# Patient Record
Sex: Male | Born: 1935 | Race: White | Hispanic: No | Marital: Married | Smoking: Current every day smoker
Health system: Southern US, Community
[De-identification: ages and names within clinical notes are randomized; demographics above are authoritative.]

## PROBLEM LIST (undated history)

## (undated) DIAGNOSIS — I5020 Unspecified systolic (congestive) heart failure: Secondary | ICD-10-CM

## (undated) DIAGNOSIS — N189 Chronic kidney disease, unspecified: Secondary | ICD-10-CM

## (undated) DIAGNOSIS — C9000 Multiple myeloma not having achieved remission: Secondary | ICD-10-CM

## (undated) DIAGNOSIS — IMO0001 Reserved for inherently not codable concepts without codable children: Secondary | ICD-10-CM

## (undated) DIAGNOSIS — K219 Gastro-esophageal reflux disease without esophagitis: Secondary | ICD-10-CM

## (undated) DIAGNOSIS — I219 Acute myocardial infarction, unspecified: Secondary | ICD-10-CM

## (undated) DIAGNOSIS — G629 Polyneuropathy, unspecified: Secondary | ICD-10-CM

## (undated) DIAGNOSIS — I639 Cerebral infarction, unspecified: Secondary | ICD-10-CM

## (undated) DIAGNOSIS — I10 Essential (primary) hypertension: Secondary | ICD-10-CM

## (undated) DIAGNOSIS — C801 Malignant (primary) neoplasm, unspecified: Secondary | ICD-10-CM

## (undated) DIAGNOSIS — I2510 Atherosclerotic heart disease of native coronary artery without angina pectoris: Secondary | ICD-10-CM

## (undated) DIAGNOSIS — J449 Chronic obstructive pulmonary disease, unspecified: Secondary | ICD-10-CM

## (undated) HISTORY — PX: TOTAL HIP ARTHROPLASTY: SHX124

## (undated) HISTORY — DX: Malignant (primary) neoplasm, unspecified: C80.1

## (undated) HISTORY — DX: Multiple myeloma not having achieved remission: C90.00

## (undated) HISTORY — DX: Chronic kidney disease, unspecified: N18.9

## (undated) HISTORY — DX: Acute myocardial infarction, unspecified: I21.9

## (undated) HISTORY — PX: APPENDECTOMY: SHX54

## (undated) HISTORY — PX: CHOLECYSTECTOMY: SHX55

## (undated) HISTORY — PX: AV FISTULA PLACEMENT: SHX1204

---

## 2003-11-12 DIAGNOSIS — I219 Acute myocardial infarction, unspecified: Secondary | ICD-10-CM

## 2003-11-12 HISTORY — DX: Acute myocardial infarction, unspecified: I21.9

## 2003-12-30 ENCOUNTER — Other Ambulatory Visit: Payer: Self-pay

## 2003-12-31 ENCOUNTER — Other Ambulatory Visit: Payer: Self-pay

## 2004-01-03 ENCOUNTER — Other Ambulatory Visit: Payer: Self-pay

## 2004-09-03 ENCOUNTER — Ambulatory Visit: Payer: Self-pay | Admitting: Unknown Physician Specialty

## 2004-09-13 ENCOUNTER — Ambulatory Visit: Payer: Self-pay | Admitting: Internal Medicine

## 2004-09-25 ENCOUNTER — Ambulatory Visit: Payer: Self-pay | Admitting: Unknown Physician Specialty

## 2004-10-11 ENCOUNTER — Ambulatory Visit: Payer: Self-pay | Admitting: Internal Medicine

## 2004-11-11 ENCOUNTER — Ambulatory Visit: Payer: Self-pay | Admitting: Internal Medicine

## 2004-12-12 ENCOUNTER — Ambulatory Visit: Payer: Self-pay | Admitting: Internal Medicine

## 2005-01-09 ENCOUNTER — Ambulatory Visit: Payer: Self-pay | Admitting: Internal Medicine

## 2005-02-09 ENCOUNTER — Ambulatory Visit: Payer: Self-pay | Admitting: Internal Medicine

## 2005-03-11 ENCOUNTER — Ambulatory Visit: Payer: Self-pay | Admitting: Internal Medicine

## 2005-04-25 ENCOUNTER — Ambulatory Visit: Payer: Self-pay | Admitting: Internal Medicine

## 2005-05-11 ENCOUNTER — Ambulatory Visit: Payer: Self-pay | Admitting: Internal Medicine

## 2005-06-11 ENCOUNTER — Ambulatory Visit: Payer: Self-pay | Admitting: Internal Medicine

## 2005-07-12 ENCOUNTER — Ambulatory Visit: Payer: Self-pay | Admitting: Internal Medicine

## 2005-08-11 ENCOUNTER — Ambulatory Visit: Payer: Self-pay | Admitting: Internal Medicine

## 2005-09-17 ENCOUNTER — Ambulatory Visit: Payer: Self-pay | Admitting: Internal Medicine

## 2005-10-11 ENCOUNTER — Ambulatory Visit: Payer: Self-pay | Admitting: Internal Medicine

## 2005-11-11 ENCOUNTER — Ambulatory Visit: Payer: Self-pay | Admitting: Internal Medicine

## 2005-12-12 ENCOUNTER — Ambulatory Visit: Payer: Self-pay | Admitting: Internal Medicine

## 2006-01-09 ENCOUNTER — Ambulatory Visit: Payer: Self-pay | Admitting: Internal Medicine

## 2006-02-09 ENCOUNTER — Ambulatory Visit: Payer: Self-pay | Admitting: Internal Medicine

## 2006-04-01 ENCOUNTER — Ambulatory Visit: Payer: Self-pay | Admitting: Internal Medicine

## 2006-04-11 ENCOUNTER — Ambulatory Visit: Payer: Self-pay | Admitting: Internal Medicine

## 2006-05-27 ENCOUNTER — Ambulatory Visit: Payer: Self-pay | Admitting: Internal Medicine

## 2006-06-11 ENCOUNTER — Ambulatory Visit: Payer: Self-pay | Admitting: Internal Medicine

## 2006-07-22 ENCOUNTER — Ambulatory Visit: Payer: Self-pay | Admitting: Internal Medicine

## 2006-08-11 ENCOUNTER — Ambulatory Visit: Payer: Self-pay | Admitting: Internal Medicine

## 2006-09-16 ENCOUNTER — Ambulatory Visit: Payer: Self-pay | Admitting: Internal Medicine

## 2006-10-11 ENCOUNTER — Ambulatory Visit: Payer: Self-pay | Admitting: Internal Medicine

## 2006-11-11 ENCOUNTER — Ambulatory Visit: Payer: Self-pay | Admitting: Internal Medicine

## 2006-12-12 ENCOUNTER — Ambulatory Visit: Payer: Self-pay | Admitting: Internal Medicine

## 2007-01-10 ENCOUNTER — Ambulatory Visit: Payer: Self-pay | Admitting: Internal Medicine

## 2007-02-16 ENCOUNTER — Ambulatory Visit: Payer: Self-pay | Admitting: Oncology

## 2007-02-23 ENCOUNTER — Other Ambulatory Visit: Payer: Self-pay

## 2007-02-23 ENCOUNTER — Emergency Department: Payer: Self-pay | Admitting: Unknown Physician Specialty

## 2007-03-03 ENCOUNTER — Ambulatory Visit: Payer: Self-pay | Admitting: Internal Medicine

## 2007-03-12 ENCOUNTER — Ambulatory Visit: Payer: Self-pay | Admitting: Oncology

## 2007-03-31 ENCOUNTER — Ambulatory Visit: Payer: Self-pay | Admitting: Internal Medicine

## 2007-04-12 ENCOUNTER — Ambulatory Visit: Payer: Self-pay | Admitting: Oncology

## 2007-04-12 ENCOUNTER — Ambulatory Visit: Payer: Self-pay | Admitting: Internal Medicine

## 2007-05-11 ENCOUNTER — Other Ambulatory Visit: Payer: Self-pay

## 2007-05-11 ENCOUNTER — Emergency Department: Payer: Self-pay | Admitting: Emergency Medicine

## 2007-06-12 ENCOUNTER — Ambulatory Visit: Payer: Self-pay | Admitting: Cardiology

## 2007-06-23 ENCOUNTER — Ambulatory Visit: Payer: Self-pay | Admitting: Internal Medicine

## 2007-06-24 ENCOUNTER — Ambulatory Visit: Payer: Self-pay | Admitting: Internal Medicine

## 2007-07-13 ENCOUNTER — Ambulatory Visit: Payer: Self-pay | Admitting: Internal Medicine

## 2007-08-12 ENCOUNTER — Ambulatory Visit: Payer: Self-pay | Admitting: Internal Medicine

## 2007-09-14 ENCOUNTER — Ambulatory Visit: Payer: Self-pay | Admitting: Internal Medicine

## 2007-09-16 ENCOUNTER — Ambulatory Visit: Payer: Self-pay | Admitting: Internal Medicine

## 2007-10-12 ENCOUNTER — Ambulatory Visit: Payer: Self-pay | Admitting: Internal Medicine

## 2007-11-12 ENCOUNTER — Ambulatory Visit: Payer: Self-pay | Admitting: Internal Medicine

## 2007-12-13 ENCOUNTER — Ambulatory Visit: Payer: Self-pay | Admitting: Internal Medicine

## 2008-01-10 ENCOUNTER — Ambulatory Visit: Payer: Self-pay | Admitting: Internal Medicine

## 2008-02-10 ENCOUNTER — Ambulatory Visit: Payer: Self-pay | Admitting: Internal Medicine

## 2008-03-11 ENCOUNTER — Ambulatory Visit: Payer: Self-pay | Admitting: Internal Medicine

## 2008-04-11 ENCOUNTER — Ambulatory Visit: Payer: Self-pay | Admitting: Internal Medicine

## 2008-05-11 ENCOUNTER — Ambulatory Visit: Payer: Self-pay | Admitting: Internal Medicine

## 2008-06-11 ENCOUNTER — Ambulatory Visit: Payer: Self-pay | Admitting: Internal Medicine

## 2008-07-12 ENCOUNTER — Ambulatory Visit: Payer: Self-pay | Admitting: Internal Medicine

## 2008-08-11 ENCOUNTER — Ambulatory Visit: Payer: Self-pay | Admitting: Internal Medicine

## 2008-09-11 ENCOUNTER — Ambulatory Visit: Payer: Self-pay | Admitting: Internal Medicine

## 2008-10-11 ENCOUNTER — Ambulatory Visit: Payer: Self-pay | Admitting: Internal Medicine

## 2008-11-11 ENCOUNTER — Ambulatory Visit: Payer: Self-pay | Admitting: Internal Medicine

## 2008-12-12 ENCOUNTER — Ambulatory Visit: Payer: Self-pay | Admitting: Internal Medicine

## 2008-12-28 ENCOUNTER — Ambulatory Visit: Payer: Self-pay | Admitting: Internal Medicine

## 2009-01-07 ENCOUNTER — Observation Stay: Payer: Self-pay | Admitting: Internal Medicine

## 2009-01-09 ENCOUNTER — Ambulatory Visit: Payer: Self-pay | Admitting: Internal Medicine

## 2009-02-09 ENCOUNTER — Ambulatory Visit: Payer: Self-pay | Admitting: Internal Medicine

## 2009-02-24 ENCOUNTER — Ambulatory Visit: Payer: Self-pay | Admitting: Internal Medicine

## 2009-03-11 ENCOUNTER — Ambulatory Visit: Payer: Self-pay | Admitting: Internal Medicine

## 2009-04-11 ENCOUNTER — Ambulatory Visit: Payer: Self-pay | Admitting: Internal Medicine

## 2009-05-11 ENCOUNTER — Ambulatory Visit: Payer: Self-pay | Admitting: Internal Medicine

## 2009-06-28 ENCOUNTER — Ambulatory Visit: Payer: Self-pay | Admitting: Internal Medicine

## 2009-07-12 ENCOUNTER — Ambulatory Visit: Payer: Self-pay | Admitting: Internal Medicine

## 2009-08-11 ENCOUNTER — Ambulatory Visit: Payer: Self-pay | Admitting: Internal Medicine

## 2009-08-23 ENCOUNTER — Ambulatory Visit: Payer: Self-pay | Admitting: Internal Medicine

## 2009-09-11 ENCOUNTER — Ambulatory Visit: Payer: Self-pay | Admitting: Internal Medicine

## 2009-10-11 ENCOUNTER — Ambulatory Visit: Payer: Self-pay | Admitting: Internal Medicine

## 2009-10-14 ENCOUNTER — Ambulatory Visit: Payer: Self-pay | Admitting: Family Medicine

## 2009-11-11 ENCOUNTER — Ambulatory Visit: Payer: Self-pay | Admitting: Internal Medicine

## 2009-11-15 ENCOUNTER — Ambulatory Visit: Payer: Self-pay | Admitting: Internal Medicine

## 2009-11-24 ENCOUNTER — Ambulatory Visit: Payer: Self-pay | Admitting: Unknown Physician Specialty

## 2009-12-12 ENCOUNTER — Ambulatory Visit: Payer: Self-pay | Admitting: Internal Medicine

## 2010-01-09 ENCOUNTER — Ambulatory Visit: Payer: Self-pay | Admitting: Internal Medicine

## 2010-02-09 ENCOUNTER — Ambulatory Visit: Payer: Self-pay | Admitting: Internal Medicine

## 2010-02-14 ENCOUNTER — Ambulatory Visit: Payer: Self-pay | Admitting: Internal Medicine

## 2010-03-11 ENCOUNTER — Ambulatory Visit: Payer: Self-pay | Admitting: Internal Medicine

## 2010-04-11 ENCOUNTER — Ambulatory Visit: Payer: Self-pay | Admitting: Internal Medicine

## 2010-05-09 ENCOUNTER — Ambulatory Visit: Payer: Self-pay | Admitting: Internal Medicine

## 2010-05-11 ENCOUNTER — Ambulatory Visit: Payer: Self-pay | Admitting: Internal Medicine

## 2010-06-11 ENCOUNTER — Ambulatory Visit: Payer: Self-pay | Admitting: Internal Medicine

## 2010-06-20 ENCOUNTER — Ambulatory Visit: Payer: Self-pay | Admitting: Internal Medicine

## 2010-07-12 ENCOUNTER — Ambulatory Visit: Payer: Self-pay | Admitting: Internal Medicine

## 2010-08-11 ENCOUNTER — Ambulatory Visit: Payer: Self-pay | Admitting: Internal Medicine

## 2010-09-11 ENCOUNTER — Ambulatory Visit: Payer: Self-pay | Admitting: Internal Medicine

## 2010-10-11 ENCOUNTER — Ambulatory Visit: Payer: Self-pay | Admitting: Internal Medicine

## 2010-11-11 ENCOUNTER — Ambulatory Visit: Payer: Self-pay | Admitting: Internal Medicine

## 2010-12-12 ENCOUNTER — Ambulatory Visit: Payer: Self-pay | Admitting: Internal Medicine

## 2011-01-22 ENCOUNTER — Ambulatory Visit: Payer: Self-pay | Admitting: Internal Medicine

## 2011-02-10 ENCOUNTER — Ambulatory Visit: Payer: Self-pay | Admitting: Internal Medicine

## 2011-02-17 ENCOUNTER — Inpatient Hospital Stay: Payer: Self-pay | Admitting: Internal Medicine

## 2011-02-17 ENCOUNTER — Ambulatory Visit: Payer: Self-pay | Admitting: Family Medicine

## 2011-03-12 ENCOUNTER — Ambulatory Visit: Payer: Self-pay | Admitting: Internal Medicine

## 2011-04-12 ENCOUNTER — Ambulatory Visit: Payer: Self-pay | Admitting: Internal Medicine

## 2011-05-12 ENCOUNTER — Ambulatory Visit: Payer: Self-pay | Admitting: Internal Medicine

## 2011-06-12 ENCOUNTER — Ambulatory Visit: Payer: Self-pay | Admitting: Internal Medicine

## 2011-07-13 ENCOUNTER — Ambulatory Visit: Payer: Self-pay | Admitting: Internal Medicine

## 2011-08-12 ENCOUNTER — Ambulatory Visit: Payer: Self-pay | Admitting: Internal Medicine

## 2011-09-12 ENCOUNTER — Ambulatory Visit: Payer: Self-pay | Admitting: Internal Medicine

## 2011-10-12 ENCOUNTER — Ambulatory Visit: Payer: Self-pay | Admitting: Internal Medicine

## 2011-11-12 ENCOUNTER — Ambulatory Visit: Payer: Self-pay | Admitting: Internal Medicine

## 2011-11-26 LAB — CBC CANCER CENTER
Basophil %: 0.3 %
Eosinophil %: 6.3 %
HGB: 11.7 g/dL — ABNORMAL LOW (ref 13.0–18.0)
Lymphocyte %: 20.3 %
MCHC: 32.7 g/dL (ref 32.0–36.0)
Monocyte %: 5 %
Neutrophil %: 68.1 %
Platelet: 156 x10 3/mm (ref 150–440)
RBC: 3.62 10*6/uL — ABNORMAL LOW (ref 4.40–5.90)
WBC: 5.6 x10 3/mm (ref 3.8–10.6)

## 2011-11-26 LAB — CREATININE, SERUM: Creatinine: 2.98 mg/dL — ABNORMAL HIGH (ref 0.60–1.30)

## 2011-11-27 LAB — PROT IMMUNOELECTROPHORES(ARMC)

## 2011-12-13 ENCOUNTER — Ambulatory Visit: Payer: Self-pay | Admitting: Internal Medicine

## 2012-01-09 LAB — CBC CANCER CENTER
Basophil %: 0.4 %
Eosinophil %: 6.1 %
HCT: 37.7 % — ABNORMAL LOW (ref 40.0–52.0)
MCH: 32.3 pg (ref 26.0–34.0)
MCV: 98.3 fL (ref 80–100)
Monocyte #: 0.4 x10 3/mm (ref 0.0–0.7)
Neutrophil #: 4.9 x10 3/mm (ref 1.4–6.5)
Neutrophil %: 63.9 %
WBC: 7.7 x10 3/mm (ref 3.8–10.6)

## 2012-01-09 LAB — CALCIUM: Calcium, Total: 9.2 mg/dL (ref 8.5–10.1)

## 2012-01-09 LAB — URIC ACID: Uric Acid: 7 mg/dL (ref 3.5–7.2)

## 2012-01-09 LAB — CREATININE, SERUM
Creatinine: 3.67 mg/dL — ABNORMAL HIGH (ref 0.60–1.30)
EGFR (Non-African Amer.): 17 — ABNORMAL LOW

## 2012-01-10 ENCOUNTER — Ambulatory Visit: Payer: Self-pay | Admitting: Internal Medicine

## 2012-01-10 LAB — KAPPA/LAMBDA FREE LIGHT CHAINS (ARMC)

## 2012-02-25 ENCOUNTER — Ambulatory Visit: Payer: Self-pay | Admitting: Internal Medicine

## 2012-02-25 LAB — CBC CANCER CENTER
Basophil %: 0.3 %
Eosinophil %: 3.1 %
HCT: 34.9 % — ABNORMAL LOW (ref 40.0–52.0)
HGB: 11.4 g/dL — ABNORMAL LOW (ref 13.0–18.0)
Lymphocyte #: 1.3 x10 3/mm (ref 1.0–3.6)
MCH: 32.2 pg (ref 26.0–34.0)
MCHC: 32.7 g/dL (ref 32.0–36.0)
MCV: 99 fL (ref 80–100)
Monocyte #: 0.5 x10 3/mm (ref 0.2–1.0)
Monocyte %: 5.7 %
Neutrophil #: 6.9 x10 3/mm — ABNORMAL HIGH (ref 1.4–6.5)
Neutrophil %: 76.8 %
RDW: 14.4 % (ref 11.5–14.5)

## 2012-02-25 LAB — URIC ACID: Uric Acid: 6.3 mg/dL (ref 3.5–7.2)

## 2012-02-25 LAB — CREATININE, SERUM
Creatinine: 3.06 mg/dL — ABNORMAL HIGH (ref 0.60–1.30)
EGFR (African American): 22 — ABNORMAL LOW

## 2012-02-25 LAB — CALCIUM: Calcium, Total: 8.9 mg/dL (ref 8.5–10.1)

## 2012-02-26 LAB — KAPPA/LAMBDA FREE LIGHT CHAINS (ARMC)

## 2012-02-26 LAB — PROT IMMUNOELECTROPHORES(ARMC)

## 2012-03-11 ENCOUNTER — Ambulatory Visit: Payer: Self-pay | Admitting: Internal Medicine

## 2012-03-11 LAB — CBC CANCER CENTER
Basophil #: 0 x10 3/mm (ref 0.0–0.1)
Basophil %: 0.7 %
Eosinophil #: 0.3 x10 3/mm (ref 0.0–0.7)
Eosinophil %: 4.5 %
HCT: 37.4 % — ABNORMAL LOW (ref 40.0–52.0)
HGB: 12.2 g/dL — ABNORMAL LOW (ref 13.0–18.0)
Lymphocyte #: 1.6 x10 3/mm (ref 1.0–3.6)
Lymphocyte %: 23.1 %
MCH: 32.3 pg (ref 26.0–34.0)
MCHC: 32.7 g/dL (ref 32.0–36.0)
Monocyte #: 0.3 x10 3/mm (ref 0.2–1.0)
Monocyte %: 4.8 %
Neutrophil #: 4.7 x10 3/mm (ref 1.4–6.5)
Neutrophil %: 66.9 %
Platelet: 156 x10 3/mm (ref 150–440)
RBC: 3.79 10*6/uL — ABNORMAL LOW (ref 4.40–5.90)
RDW: 14.8 % — ABNORMAL HIGH (ref 11.5–14.5)
WBC: 7 x10 3/mm (ref 3.8–10.6)

## 2012-03-11 LAB — CREATININE, SERUM: EGFR (African American): 18 — ABNORMAL LOW

## 2012-04-07 LAB — CBC CANCER CENTER
Basophil #: 0.1 x10 3/mm (ref 0.0–0.1)
Basophil %: 1 %
Eosinophil #: 0.4 x10 3/mm (ref 0.0–0.7)
Eosinophil %: 5 %
HCT: 38.6 % — ABNORMAL LOW (ref 40.0–52.0)
HGB: 12.7 g/dL — ABNORMAL LOW (ref 13.0–18.0)
Lymphocyte #: 2 x10 3/mm (ref 1.0–3.6)
Lymphocyte %: 28.5 %
MCH: 32.4 pg (ref 26.0–34.0)
Monocyte #: 0.4 x10 3/mm (ref 0.2–1.0)
Monocyte %: 5 %
Neutrophil #: 4.2 x10 3/mm (ref 1.4–6.5)
Neutrophil %: 60.5 %
Platelet: 157 x10 3/mm (ref 150–440)
RBC: 3.92 10*6/uL — ABNORMAL LOW (ref 4.40–5.90)
WBC: 7 x10 3/mm (ref 3.8–10.6)

## 2012-04-07 LAB — CALCIUM: Calcium, Total: 9 mg/dL (ref 8.5–10.1)

## 2012-04-07 LAB — CREATININE, SERUM
Creatinine: 3.64 mg/dL — ABNORMAL HIGH (ref 0.60–1.30)
EGFR (African American): 18 — ABNORMAL LOW
EGFR (Non-African Amer.): 15 — ABNORMAL LOW

## 2012-04-09 LAB — PROT IMMUNOELECTROPHORES(ARMC)

## 2012-04-09 LAB — PROT IMMUNOELECT,UR-24HR

## 2012-04-11 ENCOUNTER — Ambulatory Visit: Payer: Self-pay | Admitting: Internal Medicine

## 2012-05-05 ENCOUNTER — Ambulatory Visit: Payer: Self-pay | Admitting: Nephrology

## 2012-05-05 LAB — COMPREHENSIVE METABOLIC PANEL
Albumin: 3.6 g/dL (ref 3.4–5.0)
Alkaline Phosphatase: 92 U/L (ref 50–136)
BUN: 53 mg/dL — ABNORMAL HIGH (ref 7–18)
Calcium, Total: 9 mg/dL (ref 8.5–10.1)
Chloride: 110 mmol/L — ABNORMAL HIGH (ref 98–107)
EGFR (African American): 18 — ABNORMAL LOW
SGOT(AST): 14 U/L — ABNORMAL LOW (ref 15–37)
SGPT (ALT): 14 U/L
Sodium: 140 mmol/L (ref 136–145)
Total Protein: 7.8 g/dL (ref 6.4–8.2)

## 2012-05-05 LAB — URINALYSIS, COMPLETE
Bilirubin,UR: NEGATIVE
Blood: NEGATIVE
Ketone: NEGATIVE
Ph: 5 (ref 4.5–8.0)
RBC,UR: 1 /HPF (ref 0–5)
WBC UR: 1 /HPF (ref 0–5)

## 2012-05-05 LAB — CBC WITH DIFFERENTIAL/PLATELET
Eosinophil #: 0.4 10*3/uL (ref 0.0–0.7)
Eosinophil %: 5.3 %
HCT: 37.6 % — ABNORMAL LOW (ref 40.0–52.0)
HGB: 12.5 g/dL — ABNORMAL LOW (ref 13.0–18.0)
Lymphocyte #: 1.4 10*3/uL (ref 1.0–3.6)
Lymphocyte %: 19.6 %
MCH: 32.3 pg (ref 26.0–34.0)
MCV: 97 fL (ref 80–100)
Monocyte #: 0.4 x10 3/mm (ref 0.2–1.0)
Monocyte %: 5.6 %
Platelet: 159 10*3/uL (ref 150–440)
RDW: 13.9 % (ref 11.5–14.5)

## 2012-05-05 LAB — PROTEIN / CREATININE RATIO, URINE
Protein, Random Urine: 98 mg/dL — ABNORMAL HIGH (ref 0–12)
Protein/Creat. Ratio: 590 mg/gCREAT — ABNORMAL HIGH (ref 0–200)

## 2012-05-05 LAB — APTT: Activated PTT: 25.4 secs (ref 23.6–35.9)

## 2012-05-05 LAB — PROTIME-INR: Prothrombin Time: 12.5 secs (ref 11.5–14.7)

## 2012-05-07 ENCOUNTER — Observation Stay: Payer: Self-pay | Admitting: Nephrology

## 2012-05-19 ENCOUNTER — Ambulatory Visit: Payer: Self-pay | Admitting: Internal Medicine

## 2012-05-19 LAB — CBC CANCER CENTER
Basophil #: 0 x10 3/mm (ref 0.0–0.1)
Basophil %: 0.7 %
Eosinophil #: 0.3 x10 3/mm (ref 0.0–0.7)
Eosinophil %: 5.8 %
HCT: 36.6 % — ABNORMAL LOW (ref 40.0–52.0)
HGB: 12 g/dL — ABNORMAL LOW (ref 13.0–18.0)
Lymphocyte %: 23 %
MCHC: 32.7 g/dL (ref 32.0–36.0)
Monocyte %: 6.3 %
Neutrophil #: 3.9 x10 3/mm (ref 1.4–6.5)
Neutrophil %: 64.2 %
RBC: 3.75 10*6/uL — ABNORMAL LOW (ref 4.40–5.90)
RDW: 13.5 % (ref 11.5–14.5)

## 2012-05-19 LAB — URIC ACID: Uric Acid: 10.1 mg/dL — ABNORMAL HIGH (ref 3.5–7.2)

## 2012-05-19 LAB — CALCIUM: Calcium, Total: 9.1 mg/dL (ref 8.5–10.1)

## 2012-05-19 LAB — CREATININE, SERUM: EGFR (Non-African Amer.): 14 — ABNORMAL LOW

## 2012-06-09 LAB — CBC CANCER CENTER
Basophil #: 0 x10 3/mm (ref 0.0–0.1)
Eosinophil #: 0.2 x10 3/mm (ref 0.0–0.7)
HGB: 12.3 g/dL — ABNORMAL LOW (ref 13.0–18.0)
Lymphocyte %: 17 %
MCH: 32.4 pg (ref 26.0–34.0)
MCHC: 33.2 g/dL (ref 32.0–36.0)
MCV: 98 fL (ref 80–100)
Monocyte #: 0.6 x10 3/mm (ref 0.2–1.0)
Neutrophil #: 5.9 x10 3/mm (ref 1.4–6.5)
Neutrophil %: 72.6 %
RBC: 3.81 10*6/uL — ABNORMAL LOW (ref 4.40–5.90)
RDW: 13.9 % (ref 11.5–14.5)
WBC: 8.1 x10 3/mm (ref 3.8–10.6)

## 2012-06-09 LAB — CREATININE, SERUM: EGFR (Non-African Amer.): 17 — ABNORMAL LOW

## 2012-06-09 LAB — POTASSIUM: Potassium: 4.8 mmol/L (ref 3.5–5.1)

## 2012-06-11 ENCOUNTER — Ambulatory Visit: Payer: Self-pay | Admitting: Internal Medicine

## 2012-06-16 LAB — CBC CANCER CENTER
Basophil #: 0 x10 3/mm (ref 0.0–0.1)
Eosinophil #: 0.2 x10 3/mm (ref 0.0–0.7)
Eosinophil %: 3.4 %
HCT: 33 % — ABNORMAL LOW (ref 40.0–52.0)
Lymphocyte #: 1.1 x10 3/mm (ref 1.0–3.6)
Lymphocyte %: 14.5 %
MCV: 97 fL (ref 80–100)
Monocyte #: 0.5 x10 3/mm (ref 0.2–1.0)
Monocyte %: 6.7 %
Neutrophil %: 75.1 %
Platelet: 125 x10 3/mm — ABNORMAL LOW (ref 150–440)
RBC: 3.4 10*6/uL — ABNORMAL LOW (ref 4.40–5.90)
RDW: 13.5 % (ref 11.5–14.5)
WBC: 7.3 x10 3/mm (ref 3.8–10.6)

## 2012-06-23 LAB — CBC CANCER CENTER
Basophil #: 0 x10 3/mm (ref 0.0–0.1)
Basophil %: 0.6 %
Eosinophil %: 8.8 %
HCT: 35.4 % — ABNORMAL LOW (ref 40.0–52.0)
HGB: 11.6 g/dL — ABNORMAL LOW (ref 13.0–18.0)
Lymphocyte #: 1.5 x10 3/mm (ref 1.0–3.6)
Lymphocyte %: 28.1 %
MCH: 31.9 pg (ref 26.0–34.0)
MCHC: 32.7 g/dL (ref 32.0–36.0)
MCV: 97 fL (ref 80–100)
Neutrophil #: 3 x10 3/mm (ref 1.4–6.5)
Neutrophil %: 57.4 %
RDW: 13.5 % (ref 11.5–14.5)

## 2012-06-23 LAB — CREATININE, SERUM
Creatinine: 3.74 mg/dL — ABNORMAL HIGH (ref 0.60–1.30)
EGFR (Non-African Amer.): 15 — ABNORMAL LOW

## 2012-06-30 LAB — CBC CANCER CENTER
Basophil #: 0 x10 3/mm (ref 0.0–0.1)
Basophil %: 0.2 %
Eosinophil #: 0.8 x10 3/mm — ABNORMAL HIGH (ref 0.0–0.7)
HCT: 33.9 % — ABNORMAL LOW (ref 40.0–52.0)
Lymphocyte #: 0.9 x10 3/mm — ABNORMAL LOW (ref 1.0–3.6)
MCHC: 32.4 g/dL (ref 32.0–36.0)
MCV: 97 fL (ref 80–100)
Monocyte #: 0.3 x10 3/mm (ref 0.2–1.0)
Monocyte %: 5.4 %
Neutrophil #: 4.1 x10 3/mm (ref 1.4–6.5)
Platelet: 125 x10 3/mm — ABNORMAL LOW (ref 150–440)
RDW: 14 % (ref 11.5–14.5)
WBC: 6.1 x10 3/mm (ref 3.8–10.6)

## 2012-07-07 LAB — CBC CANCER CENTER
Basophil %: 0.5 %
Eosinophil #: 0.4 x10 3/mm (ref 0.0–0.7)
Eosinophil %: 6.5 %
HCT: 33.7 % — ABNORMAL LOW (ref 40.0–52.0)
Lymphocyte %: 15.9 %
MCH: 31.6 pg (ref 26.0–34.0)
MCHC: 32.8 g/dL (ref 32.0–36.0)
MCV: 96 fL (ref 80–100)
Monocyte %: 6.6 %
Neutrophil #: 4.5 x10 3/mm (ref 1.4–6.5)
WBC: 6.5 x10 3/mm (ref 3.8–10.6)

## 2012-07-07 LAB — CREATININE, SERUM
Creatinine: 3.21 mg/dL — ABNORMAL HIGH (ref 0.60–1.30)
EGFR (African American): 20 — ABNORMAL LOW
EGFR (Non-African Amer.): 18 — ABNORMAL LOW

## 2012-07-12 ENCOUNTER — Ambulatory Visit: Payer: Self-pay | Admitting: Internal Medicine

## 2012-07-14 LAB — CBC CANCER CENTER
Basophil #: 0 x10 3/mm (ref 0.0–0.1)
Basophil %: 0.3 %
HGB: 11.4 g/dL — ABNORMAL LOW (ref 13.0–18.0)
MCH: 31.6 pg (ref 26.0–34.0)
MCHC: 32.9 g/dL (ref 32.0–36.0)
MCV: 96 fL (ref 80–100)
Monocyte #: 0.1 x10 3/mm — ABNORMAL LOW (ref 0.2–1.0)
Neutrophil %: 70.1 %
Platelet: 104 x10 3/mm — ABNORMAL LOW (ref 150–440)
RDW: 14.5 % (ref 11.5–14.5)

## 2012-07-16 LAB — BASIC METABOLIC PANEL
Anion Gap: 15 (ref 7–16)
BUN: 75 mg/dL — ABNORMAL HIGH (ref 7–18)
Calcium, Total: 8.9 mg/dL (ref 8.5–10.1)
EGFR (Non-African Amer.): 13 — ABNORMAL LOW
Glucose: 190 mg/dL — ABNORMAL HIGH (ref 65–99)
Osmolality: 303 (ref 275–301)
Potassium: 5.5 mmol/L — ABNORMAL HIGH (ref 3.5–5.1)

## 2012-07-16 LAB — PROTIME-INR
INR: 1
Prothrombin Time: 13.3 secs (ref 11.5–14.7)

## 2012-07-16 LAB — CREATININE, SERUM
Creatinine: 4.05 mg/dL — ABNORMAL HIGH (ref 0.60–1.30)
EGFR (African American): 15 — ABNORMAL LOW
EGFR (Non-African Amer.): 13 — ABNORMAL LOW

## 2012-07-21 LAB — CBC CANCER CENTER
Eosinophil #: 0.8 x10 3/mm — ABNORMAL HIGH (ref 0.0–0.7)
Lymphocyte #: 1.1 x10 3/mm (ref 1.0–3.6)
MCH: 31.3 pg (ref 26.0–34.0)
MCV: 96 fL (ref 80–100)
Monocyte #: 0.4 x10 3/mm (ref 0.2–1.0)
Platelet: 125 x10 3/mm — ABNORMAL LOW (ref 150–440)
RBC: 3.59 10*6/uL — ABNORMAL LOW (ref 4.40–5.90)
RDW: 14.7 % — ABNORMAL HIGH (ref 11.5–14.5)

## 2012-07-21 LAB — CREATININE, SERUM: EGFR (Non-African Amer.): 15 — ABNORMAL LOW

## 2012-07-28 LAB — URIC ACID: Uric Acid: 9.4 mg/dL — ABNORMAL HIGH (ref 3.5–7.2)

## 2012-07-28 LAB — CBC CANCER CENTER
Basophil #: 0 x10 3/mm (ref 0.0–0.1)
Eosinophil #: 0.6 x10 3/mm (ref 0.0–0.7)
Eosinophil %: 15.8 %
HGB: 10.3 g/dL — ABNORMAL LOW (ref 13.0–18.0)
Lymphocyte #: 1.1 x10 3/mm (ref 1.0–3.6)
MCHC: 33.3 g/dL (ref 32.0–36.0)
MCV: 97 fL (ref 80–100)
Monocyte #: 0.2 x10 3/mm (ref 0.2–1.0)
Monocyte %: 6.3 %
Neutrophil %: 50.2 %
Platelet: 111 x10 3/mm — ABNORMAL LOW (ref 150–440)
RDW: 14.7 % — ABNORMAL HIGH (ref 11.5–14.5)

## 2012-07-28 LAB — BASIC METABOLIC PANEL
Anion Gap: 11 (ref 7–16)
BUN: 64 mg/dL — ABNORMAL HIGH (ref 7–18)
Calcium, Total: 9.3 mg/dL (ref 8.5–10.1)
EGFR (African American): 16 — ABNORMAL LOW
EGFR (Non-African Amer.): 14 — ABNORMAL LOW
Glucose: 107 mg/dL — ABNORMAL HIGH (ref 65–99)
Osmolality: 298 (ref 275–301)
Sodium: 140 mmol/L (ref 136–145)

## 2012-08-05 ENCOUNTER — Ambulatory Visit: Payer: Self-pay | Admitting: Vascular Surgery

## 2012-08-05 LAB — CBC
HCT: 29.6 % — ABNORMAL LOW (ref 40.0–52.0)
MCH: 32.8 pg (ref 26.0–34.0)
MCHC: 34.3 g/dL (ref 32.0–36.0)
MCV: 96 fL (ref 80–100)
Platelet: 218 10*3/uL (ref 150–440)
RBC: 3.09 10*6/uL — ABNORMAL LOW (ref 4.40–5.90)
RDW: 15.6 % — ABNORMAL HIGH (ref 11.5–14.5)

## 2012-08-05 LAB — BASIC METABOLIC PANEL
BUN: 46 mg/dL — ABNORMAL HIGH (ref 7–18)
Calcium, Total: 8.8 mg/dL (ref 8.5–10.1)
EGFR (African American): 25 — ABNORMAL LOW
EGFR (Non-African Amer.): 21 — ABNORMAL LOW
Glucose: 104 mg/dL — ABNORMAL HIGH (ref 65–99)
Osmolality: 297 (ref 275–301)
Sodium: 143 mmol/L (ref 136–145)

## 2012-08-11 ENCOUNTER — Ambulatory Visit: Payer: Self-pay | Admitting: Internal Medicine

## 2012-08-11 LAB — CBC CANCER CENTER
Basophil %: 0.3 %
Eosinophil %: 6.7 %
HGB: 9.4 g/dL — ABNORMAL LOW (ref 13.0–18.0)
Lymphocyte #: 0.8 x10 3/mm — ABNORMAL LOW (ref 1.0–3.6)
MCH: 32.6 pg (ref 26.0–34.0)
Monocyte %: 6.6 %
Neutrophil #: 3.2 x10 3/mm (ref 1.4–6.5)
Neutrophil %: 68.3 %
Platelet: 183 x10 3/mm (ref 150–440)
RBC: 2.89 10*6/uL — ABNORMAL LOW (ref 4.40–5.90)
RDW: 16.6 % — ABNORMAL HIGH (ref 11.5–14.5)
WBC: 4.7 x10 3/mm (ref 3.8–10.6)

## 2012-08-11 LAB — BASIC METABOLIC PANEL
Co2: 25 mmol/L (ref 21–32)
Creatinine: 3.14 mg/dL — ABNORMAL HIGH (ref 0.60–1.30)
EGFR (African American): 21 — ABNORMAL LOW
EGFR (Non-African Amer.): 18 — ABNORMAL LOW
Glucose: 104 mg/dL — ABNORMAL HIGH (ref 65–99)
Osmolality: 299 (ref 275–301)
Potassium: 4.4 mmol/L (ref 3.5–5.1)

## 2012-08-11 LAB — IRON AND TIBC: Iron: 70 ug/dL (ref 65–175)

## 2012-08-14 ENCOUNTER — Ambulatory Visit: Payer: Self-pay | Admitting: Vascular Surgery

## 2012-08-25 LAB — CREATININE, SERUM
Creatinine: 3.16 mg/dL — ABNORMAL HIGH (ref 0.60–1.30)
EGFR (African American): 21 — ABNORMAL LOW
EGFR (Non-African Amer.): 18 — ABNORMAL LOW

## 2012-08-25 LAB — CANCER CENTER HEMOGLOBIN: HGB: 9.7 g/dL — ABNORMAL LOW (ref 13.0–18.0)

## 2012-09-08 LAB — CBC CANCER CENTER
Basophil %: 0.9 %
Eosinophil #: 0.4 x10 3/mm (ref 0.0–0.7)
HCT: 32.2 % — ABNORMAL LOW (ref 40.0–52.0)
Lymphocyte #: 1 x10 3/mm (ref 1.0–3.6)
Lymphocyte %: 20.5 %
MCH: 32.7 pg (ref 26.0–34.0)
MCHC: 32.4 g/dL (ref 32.0–36.0)
Monocyte %: 6.1 %
Neutrophil #: 3.3 x10 3/mm (ref 1.4–6.5)
Neutrophil %: 65 %
Platelet: 157 x10 3/mm (ref 150–440)
RBC: 3.19 10*6/uL — ABNORMAL LOW (ref 4.40–5.90)
WBC: 5.1 x10 3/mm (ref 3.8–10.6)

## 2012-09-08 LAB — CREATININE, SERUM
EGFR (African American): 18 — ABNORMAL LOW
EGFR (Non-African Amer.): 16 — ABNORMAL LOW

## 2012-09-08 LAB — POTASSIUM: Potassium: 4.5 mmol/L (ref 3.5–5.1)

## 2012-09-11 ENCOUNTER — Ambulatory Visit: Payer: Self-pay | Admitting: Internal Medicine

## 2012-09-22 LAB — CANCER CENTER HEMOGLOBIN: HGB: 10.9 g/dL — ABNORMAL LOW (ref 13.0–18.0)

## 2012-09-22 LAB — POTASSIUM: Potassium: 4.1 mmol/L (ref 3.5–5.1)

## 2012-09-28 LAB — PROT IMMUNOELECT,UR-24HR

## 2012-10-11 ENCOUNTER — Ambulatory Visit: Payer: Self-pay | Admitting: Internal Medicine

## 2012-10-20 LAB — CBC CANCER CENTER
Basophil #: 0 x10 3/mm (ref 0.0–0.1)
Eosinophil #: 0.2 x10 3/mm (ref 0.0–0.7)
Eosinophil %: 2.4 %
HGB: 11.7 g/dL — ABNORMAL LOW (ref 13.0–18.0)
Lymphocyte #: 1.1 x10 3/mm (ref 1.0–3.6)
MCH: 30.9 pg (ref 26.0–34.0)
MCHC: 32.8 g/dL (ref 32.0–36.0)
MCV: 94 fL (ref 80–100)
Monocyte #: 0.7 x10 3/mm (ref 0.2–1.0)
Monocyte %: 7.4 %
Neutrophil #: 6.9 x10 3/mm — ABNORMAL HIGH (ref 1.4–6.5)
Platelet: 164 x10 3/mm (ref 150–440)
RDW: 13.6 % (ref 11.5–14.5)
WBC: 8.8 x10 3/mm (ref 3.8–10.6)

## 2012-10-20 LAB — BASIC METABOLIC PANEL
BUN: 66 mg/dL — ABNORMAL HIGH (ref 7–18)
Chloride: 101 mmol/L (ref 98–107)
Co2: 33 mmol/L — ABNORMAL HIGH (ref 21–32)
Creatinine: 4.08 mg/dL — ABNORMAL HIGH (ref 0.60–1.30)
Potassium: 4.8 mmol/L (ref 3.5–5.1)
Sodium: 140 mmol/L (ref 136–145)

## 2012-10-21 LAB — PROT IMMUNOELECTROPHORES(ARMC)

## 2012-10-27 LAB — BASIC METABOLIC PANEL
BUN: 56 mg/dL — ABNORMAL HIGH (ref 7–18)
Calcium, Total: 9 mg/dL (ref 8.5–10.1)
Creatinine: 3.71 mg/dL — ABNORMAL HIGH (ref 0.60–1.30)
EGFR (African American): 17 — ABNORMAL LOW
EGFR (Non-African Amer.): 15 — ABNORMAL LOW
Osmolality: 302 (ref 275–301)
Potassium: 4.4 mmol/L (ref 3.5–5.1)

## 2012-10-27 LAB — CANCER CENTER HEMOGLOBIN: HGB: 11.5 g/dL — ABNORMAL LOW (ref 13.0–18.0)

## 2012-11-03 ENCOUNTER — Ambulatory Visit: Payer: Self-pay | Admitting: Vascular Surgery

## 2012-11-03 LAB — BASIC METABOLIC PANEL
Anion Gap: 12 (ref 7–16)
BUN: 50 mg/dL — ABNORMAL HIGH (ref 7–18)
Calcium, Total: 9.1 mg/dL (ref 8.5–10.1)
Co2: 24 mmol/L (ref 21–32)
Creatinine: 3.33 mg/dL — ABNORMAL HIGH (ref 0.60–1.30)
EGFR (African American): 19 — ABNORMAL LOW
EGFR (Non-African Amer.): 17 — ABNORMAL LOW
Glucose: 95 mg/dL (ref 65–99)
Potassium: 4.7 mmol/L (ref 3.5–5.1)

## 2012-11-11 ENCOUNTER — Ambulatory Visit: Payer: Self-pay | Admitting: Internal Medicine

## 2012-11-17 LAB — CBC CANCER CENTER
Basophil #: 0 x10 3/mm (ref 0.0–0.1)
Basophil %: 0.6 %
Eosinophil #: 0.4 x10 3/mm (ref 0.0–0.7)
Eosinophil %: 6.1 %
HCT: 34.5 % — ABNORMAL LOW (ref 40.0–52.0)
Lymphocyte %: 18.2 %
MCV: 92 fL (ref 80–100)
Monocyte %: 5.2 %
Neutrophil #: 4.1 x10 3/mm (ref 1.4–6.5)
WBC: 5.9 x10 3/mm (ref 3.8–10.6)

## 2012-11-17 LAB — CREATININE, SERUM: EGFR (African American): 21 — ABNORMAL LOW

## 2012-11-18 LAB — KAPPA/LAMBDA FREE LIGHT CHAINS (ARMC)

## 2012-12-08 LAB — CBC CANCER CENTER
Basophil #: 0 x10 3/mm (ref 0.0–0.1)
Basophil %: 0.6 %
HCT: 34 % — ABNORMAL LOW (ref 40.0–52.0)
Lymphocyte #: 1.1 x10 3/mm (ref 1.0–3.6)
MCH: 29.4 pg (ref 26.0–34.0)
MCHC: 32.6 g/dL (ref 32.0–36.0)
MCV: 90 fL (ref 80–100)
Monocyte #: 0.3 x10 3/mm (ref 0.2–1.0)
Neutrophil %: 69 %
Platelet: 134 x10 3/mm — ABNORMAL LOW (ref 150–440)
RBC: 3.77 10*6/uL — ABNORMAL LOW (ref 4.40–5.90)
RDW: 13.8 % (ref 11.5–14.5)
WBC: 5.9 x10 3/mm (ref 3.8–10.6)

## 2012-12-08 LAB — CREATININE, SERUM
EGFR (African American): 20 — ABNORMAL LOW
EGFR (Non-African Amer.): 17 — ABNORMAL LOW

## 2012-12-12 ENCOUNTER — Ambulatory Visit: Payer: Self-pay | Admitting: Internal Medicine

## 2012-12-14 ENCOUNTER — Ambulatory Visit: Payer: Self-pay | Admitting: Vascular Surgery

## 2012-12-14 LAB — CBC
HGB: 10.8 g/dL — ABNORMAL LOW (ref 13.0–18.0)
MCH: 29.3 pg (ref 26.0–34.0)
MCV: 91 fL (ref 80–100)
RBC: 3.69 10*6/uL — ABNORMAL LOW (ref 4.40–5.90)
RDW: 14 % (ref 11.5–14.5)
WBC: 6.4 10*3/uL (ref 3.8–10.6)

## 2012-12-14 LAB — BASIC METABOLIC PANEL
Anion Gap: 10 (ref 7–16)
BUN: 44 mg/dL — ABNORMAL HIGH (ref 7–18)
Calcium, Total: 8.5 mg/dL (ref 8.5–10.1)
Chloride: 109 mmol/L — ABNORMAL HIGH (ref 98–107)
EGFR (Non-African Amer.): 19 — ABNORMAL LOW
Glucose: 103 mg/dL — ABNORMAL HIGH (ref 65–99)
Potassium: 4.4 mmol/L (ref 3.5–5.1)

## 2012-12-15 ENCOUNTER — Ambulatory Visit: Payer: Self-pay | Admitting: Internal Medicine

## 2012-12-18 ENCOUNTER — Ambulatory Visit: Payer: Self-pay | Admitting: Vascular Surgery

## 2012-12-22 LAB — CBC CANCER CENTER
Basophil #: 0 x10 3/mm (ref 0.0–0.1)
Basophil %: 0.7 %
Eosinophil %: 4.8 %
HCT: 32.9 % — ABNORMAL LOW (ref 40.0–52.0)
Lymphocyte #: 1 x10 3/mm (ref 1.0–3.6)
MCH: 28.4 pg (ref 26.0–34.0)
MCHC: 31.9 g/dL — ABNORMAL LOW (ref 32.0–36.0)
MCV: 89 fL (ref 80–100)
Monocyte #: 0.3 x10 3/mm (ref 0.2–1.0)
Monocyte %: 5.8 %
Neutrophil #: 4.2 x10 3/mm (ref 1.4–6.5)
Neutrophil %: 71.6 %
WBC: 5.8 x10 3/mm (ref 3.8–10.6)

## 2012-12-22 LAB — CREATININE, SERUM
EGFR (African American): 20 — ABNORMAL LOW
EGFR (Non-African Amer.): 17 — ABNORMAL LOW

## 2012-12-23 LAB — PROT IMMUNOELECTROPHORES(ARMC)

## 2012-12-28 LAB — PROT IMMUNOELECT,UR-24HR

## 2013-01-09 ENCOUNTER — Ambulatory Visit: Payer: Self-pay | Admitting: Internal Medicine

## 2013-01-14 LAB — ALBUMIN: Albumin: 3.5 g/dL (ref 3.4–5.0)

## 2013-01-14 LAB — BASIC METABOLIC PANEL
Anion Gap: 11 (ref 7–16)
BUN: 51 mg/dL — ABNORMAL HIGH (ref 7–18)
Calcium, Total: 8.9 mg/dL (ref 8.5–10.1)
Chloride: 106 mmol/L (ref 98–107)
EGFR (African American): 18 — ABNORMAL LOW
Potassium: 4.8 mmol/L (ref 3.5–5.1)
Sodium: 142 mmol/L (ref 136–145)

## 2013-01-14 LAB — CBC CANCER CENTER
Basophil #: 0.1 x10 3/mm (ref 0.0–0.1)
Basophil %: 1.1 %
Eosinophil %: 8.7 %
Lymphocyte #: 1 x10 3/mm (ref 1.0–3.6)
Lymphocyte %: 15.3 %
MCH: 28.9 pg (ref 26.0–34.0)
MCV: 89 fL (ref 80–100)
Monocyte #: 0.4 x10 3/mm (ref 0.2–1.0)
Monocyte %: 6.1 %
Neutrophil #: 4.3 x10 3/mm (ref 1.4–6.5)
Platelet: 157 x10 3/mm (ref 150–440)
RBC: 3.64 10*6/uL — ABNORMAL LOW (ref 4.40–5.90)
RDW: 15 % — ABNORMAL HIGH (ref 11.5–14.5)

## 2013-01-14 LAB — MAGNESIUM: Magnesium: 1.9 mg/dL

## 2013-02-09 ENCOUNTER — Ambulatory Visit: Payer: Self-pay | Admitting: Internal Medicine

## 2013-02-09 LAB — CBC CANCER CENTER
Eosinophil %: 4.2 %
HCT: 33.9 % — ABNORMAL LOW (ref 40.0–52.0)
HGB: 10.7 g/dL — ABNORMAL LOW (ref 13.0–18.0)
Lymphocyte #: 1.1 x10 3/mm (ref 1.0–3.6)
MCH: 27.8 pg (ref 26.0–34.0)
MCHC: 31.6 g/dL — ABNORMAL LOW (ref 32.0–36.0)
Neutrophil #: 2.5 x10 3/mm (ref 1.4–6.5)
Neutrophil %: 61.8 %
Platelet: 138 x10 3/mm — ABNORMAL LOW (ref 150–440)
WBC: 4 x10 3/mm (ref 3.8–10.6)

## 2013-02-09 LAB — CREATININE, SERUM
EGFR (African American): 18 — ABNORMAL LOW
EGFR (Non-African Amer.): 16 — ABNORMAL LOW

## 2013-02-10 LAB — FREE K+L LT CHAIN, QT, UR (24 HOUR)

## 2013-02-14 ENCOUNTER — Ambulatory Visit: Payer: Self-pay | Admitting: Physician Assistant

## 2013-03-02 LAB — CREATININE, SERUM: Creatinine: 3.37 mg/dL — ABNORMAL HIGH (ref 0.60–1.30)

## 2013-03-02 LAB — CANCER CENTER HEMOGLOBIN: HGB: 10 g/dL — ABNORMAL LOW (ref 13.0–18.0)

## 2013-03-11 ENCOUNTER — Ambulatory Visit: Payer: Self-pay | Admitting: Internal Medicine

## 2013-03-23 LAB — CREATININE, SERUM
Creatinine: 3.33 mg/dL — ABNORMAL HIGH (ref 0.60–1.30)
EGFR (African American): 19 — ABNORMAL LOW
EGFR (Non-African Amer.): 17 — ABNORMAL LOW

## 2013-03-23 LAB — CBC CANCER CENTER
Basophil %: 0.1 %
Lymphocyte %: 23.1 %
MCH: 26.6 pg (ref 26.0–34.0)
Monocyte #: 0.2 x10 3/mm (ref 0.2–1.0)
Neutrophil #: 3 x10 3/mm (ref 1.4–6.5)
Neutrophil %: 68.9 %
Platelet: 136 x10 3/mm — ABNORMAL LOW (ref 150–440)
RBC: 4.09 10*6/uL — ABNORMAL LOW (ref 4.40–5.90)
RDW: 14.8 % — ABNORMAL HIGH (ref 11.5–14.5)
WBC: 4.4 x10 3/mm (ref 3.8–10.6)

## 2013-03-23 LAB — IRON AND TIBC
Iron Saturation: 11 %
Iron: 41 ug/dL — ABNORMAL LOW (ref 65–175)
Unbound Iron-Bind.Cap.: 348 ug/dL

## 2013-03-23 LAB — CALCIUM: Calcium, Total: 8.8 mg/dL (ref 8.5–10.1)

## 2013-03-25 LAB — PROT IMMUNOELECTROPHORES(ARMC)

## 2013-03-31 DIAGNOSIS — G629 Polyneuropathy, unspecified: Secondary | ICD-10-CM | POA: Insufficient documentation

## 2013-03-31 DIAGNOSIS — D649 Anemia, unspecified: Secondary | ICD-10-CM | POA: Insufficient documentation

## 2013-04-11 ENCOUNTER — Ambulatory Visit: Payer: Self-pay | Admitting: Internal Medicine

## 2013-05-04 LAB — BASIC METABOLIC PANEL
Anion Gap: 9 (ref 7–16)
Calcium, Total: 8.4 mg/dL — ABNORMAL LOW (ref 8.5–10.1)
Chloride: 108 mmol/L — ABNORMAL HIGH (ref 98–107)
Creatinine: 3.62 mg/dL — ABNORMAL HIGH (ref 0.60–1.30)
EGFR (African American): 17 — ABNORMAL LOW
Sodium: 142 mmol/L (ref 136–145)

## 2013-05-04 LAB — CBC CANCER CENTER
Basophil #: 0 x10 3/mm (ref 0.0–0.1)
Eosinophil %: 3 %
HGB: 10.7 g/dL — ABNORMAL LOW (ref 13.0–18.0)
MCV: 86 fL (ref 80–100)
Monocyte #: 0.3 x10 3/mm (ref 0.2–1.0)
Monocyte %: 5.8 %
Neutrophil #: 3.6 x10 3/mm (ref 1.4–6.5)
Neutrophil %: 71 %
RDW: 18.7 % — ABNORMAL HIGH (ref 11.5–14.5)

## 2013-05-05 LAB — KAPPA/LAMBDA FREE LIGHT CHAINS (ARMC)

## 2013-05-11 ENCOUNTER — Ambulatory Visit: Payer: Self-pay | Admitting: Internal Medicine

## 2013-05-31 ENCOUNTER — Ambulatory Visit: Payer: Self-pay | Admitting: Unknown Physician Specialty

## 2013-06-02 LAB — PATHOLOGY REPORT

## 2013-06-15 ENCOUNTER — Ambulatory Visit: Payer: Self-pay | Admitting: Internal Medicine

## 2013-06-15 LAB — CREATININE, SERUM
Creatinine: 3.43 mg/dL — ABNORMAL HIGH (ref 0.60–1.30)
EGFR (African American): 19 — ABNORMAL LOW

## 2013-06-15 LAB — CBC CANCER CENTER
Basophil #: 0 x10 3/mm (ref 0.0–0.1)
Basophil %: 1.1 %
Eosinophil #: 0.2 x10 3/mm (ref 0.0–0.7)
HCT: 35 % — ABNORMAL LOW (ref 40.0–52.0)
HGB: 11.5 g/dL — ABNORMAL LOW (ref 13.0–18.0)
MCH: 29.3 pg (ref 26.0–34.0)
Monocyte #: 0.3 x10 3/mm (ref 0.2–1.0)
Neutrophil %: 62.7 %
Platelet: 130 x10 3/mm — ABNORMAL LOW (ref 150–440)
RBC: 3.92 10*6/uL — ABNORMAL LOW (ref 4.40–5.90)
WBC: 4.3 x10 3/mm (ref 3.8–10.6)

## 2013-06-15 LAB — CALCIUM: Calcium, Total: 8.7 mg/dL (ref 8.5–10.1)

## 2013-06-16 LAB — CBC
MCH: 29.6 pg (ref 26.0–34.0)
MCV: 89 fL (ref 80–100)
RDW: 21.1 % — ABNORMAL HIGH (ref 11.5–14.5)
WBC: 8.7 10*3/uL (ref 3.8–10.6)

## 2013-06-16 LAB — COMPREHENSIVE METABOLIC PANEL
Albumin: 3.7 g/dL (ref 3.4–5.0)
Bilirubin,Total: 0.6 mg/dL (ref 0.2–1.0)
Chloride: 111 mmol/L — ABNORMAL HIGH (ref 98–107)
Creatinine: 3.59 mg/dL — ABNORMAL HIGH (ref 0.60–1.30)
Glucose: 135 mg/dL — ABNORMAL HIGH (ref 65–99)
Potassium: 4.9 mmol/L (ref 3.5–5.1)
SGOT(AST): 12 U/L — ABNORMAL LOW (ref 15–37)
SGPT (ALT): 16 U/L (ref 12–78)
Total Protein: 7.6 g/dL (ref 6.4–8.2)

## 2013-06-17 ENCOUNTER — Inpatient Hospital Stay: Payer: Self-pay | Admitting: Internal Medicine

## 2013-06-17 LAB — CK
CK, Total: 44 U/L (ref 35–232)
CK, Total: 45 U/L (ref 35–232)

## 2013-06-17 LAB — CK-MB
CK-MB: 1.9 ng/mL (ref 0.5–3.6)
CK-MB: 1.2 ng/mL (ref 0.5–3.6)

## 2013-06-17 LAB — KAPPA/LAMBDA FREE LIGHT CHAINS (ARMC)

## 2013-06-17 LAB — TROPONIN I: Troponin-I: <0.02 ng/mL

## 2013-06-18 LAB — COMPREHENSIVE METABOLIC PANEL
Albumin: 3 g/dL — ABNORMAL LOW (ref 3.4–5.0)
Alkaline Phosphatase: 72 U/L (ref 50–136)
BUN: 65 mg/dL — ABNORMAL HIGH (ref 7–18)
Calcium, Total: 8.9 mg/dL (ref 8.5–10.1)
Chloride: 106 mmol/L (ref 98–107)
Co2: 21 mmol/L (ref 21–32)
EGFR (African American): 13 — ABNORMAL LOW
EGFR (Non-African Amer.): 12 — ABNORMAL LOW
Glucose: 133 mg/dL — ABNORMAL HIGH (ref 65–99)
Osmolality: 293 (ref 275–301)
Potassium: 5.9 mmol/L — ABNORMAL HIGH (ref 3.5–5.1)
SGOT(AST): 7 U/L — ABNORMAL LOW (ref 15–37)
Sodium: 136 mmol/L (ref 136–145)
Total Protein: 7 g/dL (ref 6.4–8.2)

## 2013-06-18 LAB — CBC WITH DIFFERENTIAL/PLATELET
Basophil #: 0 10*3/uL (ref 0.0–0.1)
Basophil %: 0.1 %
Eosinophil #: 0 10*3/uL (ref 0.0–0.7)
HCT: 31.4 % — ABNORMAL LOW (ref 40.0–52.0)
HGB: 10.7 g/dL — ABNORMAL LOW (ref 13.0–18.0)
Lymphocyte #: 0.4 10*3/uL — ABNORMAL LOW (ref 1.0–3.6)
Lymphocyte %: 3.2 %
MCHC: 34 g/dL (ref 32.0–36.0)
Monocyte %: 4.2 %
Neutrophil #: 12.1 10*3/uL — ABNORMAL HIGH (ref 1.4–6.5)
Neutrophil %: 92.5 %
Platelet: 143 10*3/uL — ABNORMAL LOW (ref 150–440)
RBC: 3.57 10*6/uL — ABNORMAL LOW (ref 4.40–5.90)
RDW: 21.2 % — ABNORMAL HIGH (ref 11.5–14.5)

## 2013-06-18 LAB — BASIC METABOLIC PANEL
Anion Gap: 9 (ref 7–16)
Chloride: 107 mmol/L (ref 98–107)
Co2: 21 mmol/L (ref 21–32)
Creatinine: 4.66 mg/dL — ABNORMAL HIGH (ref 0.60–1.30)
EGFR (African American): 13 — ABNORMAL LOW
EGFR (Non-African Amer.): 11 — ABNORMAL LOW
Glucose: 161 mg/dL — ABNORMAL HIGH (ref 65–99)
Potassium: 5.6 mmol/L — ABNORMAL HIGH (ref 3.5–5.1)
Sodium: 137 mmol/L (ref 136–145)

## 2013-06-18 LAB — LIPID PANEL
Cholesterol: 133 mg/dL (ref 0–200)
Ldl Cholesterol, Calc: 74 mg/dL (ref 0–100)
VLDL Cholesterol, Calc: 11 mg/dL (ref 5–40)

## 2013-06-18 LAB — PROTIME-INR
INR: 1.1
Prothrombin Time: 14.7 secs (ref 11.5–14.7)

## 2013-06-19 LAB — PHOSPHORUS: Phosphorus: 4.9 mg/dL (ref 2.5–4.9)

## 2013-06-19 LAB — BASIC METABOLIC PANEL
BUN: 83 mg/dL — ABNORMAL HIGH (ref 7–18)
EGFR (African American): 13 — ABNORMAL LOW
EGFR (Non-African Amer.): 11 — ABNORMAL LOW
Glucose: 108 mg/dL — ABNORMAL HIGH (ref 65–99)
Sodium: 138 mmol/L (ref 136–145)

## 2013-06-20 LAB — CBC WITH DIFFERENTIAL/PLATELET
Basophil #: 0 10*3/uL (ref 0.0–0.1)
Basophil %: 0.2 %
Eosinophil #: 0 10*3/uL (ref 0.0–0.7)
Eosinophil %: 0.2 %
HCT: 33.8 % — ABNORMAL LOW (ref 40.0–52.0)
HGB: 11.2 g/dL — ABNORMAL LOW (ref 13.0–18.0)
MCH: 29.6 pg (ref 26.0–34.0)
MCV: 89 fL (ref 80–100)
Monocyte #: 0.4 x10 3/mm (ref 0.2–1.0)
Monocyte %: 5.8 %
Neutrophil #: 5.3 10*3/uL (ref 1.4–6.5)
Neutrophil %: 77.8 %
Platelet: 142 10*3/uL — ABNORMAL LOW (ref 150–440)
RBC: 3.79 10*6/uL — ABNORMAL LOW (ref 4.40–5.90)
RDW: 21.5 % — ABNORMAL HIGH (ref 11.5–14.5)

## 2013-06-20 LAB — BASIC METABOLIC PANEL
Chloride: 108 mmol/L — ABNORMAL HIGH (ref 98–107)
Co2: 22 mmol/L (ref 21–32)
Creatinine: 4.51 mg/dL — ABNORMAL HIGH (ref 0.60–1.30)
EGFR (African American): 13 — ABNORMAL LOW
Osmolality: 304 (ref 275–301)
Sodium: 140 mmol/L (ref 136–145)

## 2013-06-22 LAB — BASIC METABOLIC PANEL
Anion Gap: 9 (ref 7–16)
Chloride: 106 mmol/L (ref 98–107)
Co2: 26 mmol/L (ref 21–32)
EGFR (Non-African Amer.): 15 — ABNORMAL LOW
Osmolality: 306 (ref 275–301)
Potassium: 4.6 mmol/L (ref 3.5–5.1)
Sodium: 141 mmol/L (ref 136–145)

## 2013-07-12 ENCOUNTER — Ambulatory Visit: Payer: Self-pay | Admitting: Internal Medicine

## 2013-07-20 DIAGNOSIS — N185 Chronic kidney disease, stage 5: Secondary | ICD-10-CM | POA: Insufficient documentation

## 2013-07-28 LAB — CBC CANCER CENTER
Basophil #: 0 x10 3/mm (ref 0.0–0.1)
Basophil %: 0.6 %
Eosinophil #: 0.1 x10 3/mm (ref 0.0–0.7)
HCT: 36.6 % — ABNORMAL LOW (ref 40.0–52.0)
Lymphocyte #: 0.9 x10 3/mm — ABNORMAL LOW (ref 1.0–3.6)
MCHC: 32.4 g/dL (ref 32.0–36.0)
MCV: 92 fL (ref 80–100)
Monocyte #: 0.3 x10 3/mm (ref 0.2–1.0)
Neutrophil #: 4 x10 3/mm (ref 1.4–6.5)
Neutrophil %: 74.7 %
Platelet: 163 x10 3/mm (ref 150–440)
RBC: 3.98 10*6/uL — ABNORMAL LOW (ref 4.40–5.90)
RDW: 16.2 % — ABNORMAL HIGH (ref 11.5–14.5)
WBC: 5.4 x10 3/mm (ref 3.8–10.6)

## 2013-07-28 LAB — BASIC METABOLIC PANEL
Calcium, Total: 8.9 mg/dL (ref 8.5–10.1)
Co2: 27 mmol/L (ref 21–32)
EGFR (African American): 18 — ABNORMAL LOW
EGFR (Non-African Amer.): 16 — ABNORMAL LOW
Glucose: 131 mg/dL — ABNORMAL HIGH (ref 65–99)
Osmolality: 301 (ref 275–301)
Potassium: 3.9 mmol/L (ref 3.5–5.1)

## 2013-07-29 LAB — KAPPA/LAMBDA FREE LIGHT CHAINS (ARMC)

## 2013-07-29 LAB — PROT IMMUNOELECTROPHORES(ARMC)

## 2013-08-11 ENCOUNTER — Ambulatory Visit: Payer: Self-pay | Admitting: Internal Medicine

## 2013-08-19 ENCOUNTER — Encounter: Payer: Self-pay | Admitting: Family Medicine

## 2013-09-07 LAB — BASIC METABOLIC PANEL
Anion Gap: 12 (ref 7–16)
BUN: 50 mg/dL — ABNORMAL HIGH (ref 7–18)
Chloride: 104 mmol/L (ref 98–107)
Co2: 28 mmol/L (ref 21–32)
Creatinine: 3.76 mg/dL — ABNORMAL HIGH (ref 0.60–1.30)
Osmolality: 301 (ref 275–301)
Sodium: 144 mmol/L (ref 136–145)

## 2013-09-07 LAB — CBC CANCER CENTER
Basophil #: 0 x10 3/mm (ref 0.0–0.1)
Basophil %: 0.4 %
Eosinophil %: 4 %
HCT: 37.3 % — ABNORMAL LOW (ref 40.0–52.0)
HGB: 12.1 g/dL — ABNORMAL LOW (ref 13.0–18.0)
Lymphocyte %: 23.3 %
MCV: 95 fL (ref 80–100)
Monocyte #: 0.3 x10 3/mm (ref 0.2–1.0)
Neutrophil #: 3.8 x10 3/mm (ref 1.4–6.5)
Platelet: 140 x10 3/mm — ABNORMAL LOW (ref 150–440)

## 2013-09-08 LAB — PROT IMMUNOELECTROPHORES(ARMC)

## 2013-09-08 LAB — KAPPA/LAMBDA FREE LIGHT CHAINS (ARMC)

## 2013-09-11 ENCOUNTER — Ambulatory Visit: Payer: Self-pay | Admitting: Internal Medicine

## 2013-09-21 LAB — CREATININE, SERUM
Creatinine: 3.36 mg/dL — ABNORMAL HIGH (ref 0.60–1.30)
EGFR (African American): 19 — ABNORMAL LOW
EGFR (Non-African Amer.): 16 — ABNORMAL LOW

## 2013-10-05 LAB — CREATININE, SERUM
Creatinine: 4.8 mg/dL — ABNORMAL HIGH (ref 0.60–1.30)
EGFR (African American): 12 — ABNORMAL LOW
EGFR (Non-African Amer.): 11 — ABNORMAL LOW

## 2013-10-05 LAB — CBC CANCER CENTER
Basophil #: 0 x10 3/mm (ref 0.0–0.1)
Eosinophil #: 0.2 x10 3/mm (ref 0.0–0.7)
Eosinophil %: 2.6 %
HCT: 31.7 % — ABNORMAL LOW (ref 40.0–52.0)
Lymphocyte #: 1 x10 3/mm (ref 1.0–3.6)
Lymphocyte %: 13.1 %
MCH: 30.3 pg (ref 26.0–34.0)
MCV: 94 fL (ref 80–100)
Monocyte #: 0.3 x10 3/mm (ref 0.2–1.0)
Monocyte %: 4.1 %
Neutrophil #: 5.8 x10 3/mm (ref 1.4–6.5)
Neutrophil %: 79.9 %
RBC: 3.37 10*6/uL — ABNORMAL LOW (ref 4.40–5.90)
RDW: 14.6 % — ABNORMAL HIGH (ref 11.5–14.5)

## 2013-10-11 ENCOUNTER — Ambulatory Visit: Payer: Self-pay | Admitting: Internal Medicine

## 2013-10-12 LAB — CANCER CENTER HEMOGLOBIN: HGB: 11 g/dL — ABNORMAL LOW (ref 13.0–18.0)

## 2013-10-12 LAB — CREATININE, SERUM: Creatinine: 4.31 mg/dL — ABNORMAL HIGH (ref 0.60–1.30)

## 2013-10-26 LAB — CBC CANCER CENTER
Basophil #: 0 x10 3/mm (ref 0.0–0.1)
Basophil %: 0.8 %
Eosinophil #: 0.6 x10 3/mm (ref 0.0–0.7)
HCT: 32.5 % — ABNORMAL LOW (ref 40.0–52.0)
HGB: 10.5 g/dL — ABNORMAL LOW (ref 13.0–18.0)
Lymphocyte %: 21.3 %
MCHC: 32.4 g/dL (ref 32.0–36.0)
Monocyte #: 0.3 x10 3/mm (ref 0.2–1.0)
Neutrophil #: 3.4 x10 3/mm (ref 1.4–6.5)
Neutrophil %: 61.7 %
RBC: 3.42 10*6/uL — ABNORMAL LOW (ref 4.40–5.90)
WBC: 5.6 x10 3/mm (ref 3.8–10.6)

## 2013-10-26 LAB — COMPREHENSIVE METABOLIC PANEL
Alkaline Phosphatase: 85 U/L
Anion Gap: 12 (ref 7–16)
BUN: 76 mg/dL — ABNORMAL HIGH (ref 7–18)
Bilirubin,Total: 0.5 mg/dL (ref 0.2–1.0)
Chloride: 103 mmol/L (ref 98–107)
EGFR (African American): 14 — ABNORMAL LOW
EGFR (Non-African Amer.): 12 — ABNORMAL LOW
SGOT(AST): 10 U/L — ABNORMAL LOW (ref 15–37)
SGPT (ALT): 11 U/L — ABNORMAL LOW (ref 12–78)
Sodium: 141 mmol/L (ref 136–145)
Total Protein: 8 g/dL (ref 6.4–8.2)

## 2013-10-26 LAB — PHOSPHORUS: Phosphorus: 4.3 mg/dL (ref 2.5–4.9)

## 2013-10-26 LAB — MAGNESIUM: Magnesium: 2.2 mg/dL

## 2013-11-02 LAB — CBC CANCER CENTER
Basophil #: 0 x10 3/mm (ref 0.0–0.1)
HGB: 10.2 g/dL — ABNORMAL LOW (ref 13.0–18.0)
Lymphocyte #: 1.2 x10 3/mm (ref 1.0–3.6)
MCH: 31.3 pg (ref 26.0–34.0)
MCV: 95 fL (ref 80–100)
Monocyte #: 0.4 x10 3/mm (ref 0.2–1.0)
Neutrophil #: 4.8 x10 3/mm (ref 1.4–6.5)
Neutrophil %: 70.8 %
Platelet: 197 x10 3/mm (ref 150–440)
WBC: 6.8 x10 3/mm (ref 3.8–10.6)

## 2013-11-02 LAB — BASIC METABOLIC PANEL
Anion Gap: 12 (ref 7–16)
Calcium, Total: 8.9 mg/dL (ref 8.5–10.1)
Chloride: 104 mmol/L (ref 98–107)
Co2: 25 mmol/L (ref 21–32)
Osmolality: 302 (ref 275–301)
Sodium: 141 mmol/L (ref 136–145)

## 2013-11-02 LAB — CREATININE, SERUM: EGFR (Non-African Amer.): 13 — ABNORMAL LOW

## 2013-11-03 LAB — PROT IMMUNOELECTROPHORES(ARMC)

## 2013-11-03 LAB — KAPPA/LAMBDA FREE LIGHT CHAINS (ARMC)

## 2013-11-11 ENCOUNTER — Ambulatory Visit: Payer: Self-pay | Admitting: Internal Medicine

## 2013-11-23 LAB — CBC CANCER CENTER
Basophil #: 0 x10 3/mm (ref 0.0–0.1)
Basophil %: 0.8 %
Eosinophil #: 0.6 x10 3/mm (ref 0.0–0.7)
Eosinophil %: 9.6 %
HCT: 34 % — AB (ref 40.0–52.0)
HGB: 10.9 g/dL — AB (ref 13.0–18.0)
LYMPHS ABS: 1.6 x10 3/mm (ref 1.0–3.6)
Lymphocyte %: 27.6 %
MCH: 30.4 pg (ref 26.0–34.0)
MCHC: 31.9 g/dL — AB (ref 32.0–36.0)
MCV: 95 fL (ref 80–100)
MONO ABS: 0.3 x10 3/mm (ref 0.2–1.0)
Monocyte %: 5.5 %
NEUTROS ABS: 3.3 x10 3/mm (ref 1.4–6.5)
Neutrophil %: 56.5 %
PLATELETS: 187 x10 3/mm (ref 150–440)
RBC: 3.57 10*6/uL — ABNORMAL LOW (ref 4.40–5.90)
RDW: 15 % — AB (ref 11.5–14.5)
WBC: 5.8 x10 3/mm (ref 3.8–10.6)

## 2013-11-23 LAB — BASIC METABOLIC PANEL
ANION GAP: 9 (ref 7–16)
BUN: 51 mg/dL — ABNORMAL HIGH (ref 7–18)
CALCIUM: 9 mg/dL (ref 8.5–10.1)
CHLORIDE: 104 mmol/L (ref 98–107)
CO2: 29 mmol/L (ref 21–32)
Creatinine: 3.88 mg/dL — ABNORMAL HIGH (ref 0.60–1.30)
EGFR (African American): 16 — ABNORMAL LOW
EGFR (Non-African Amer.): 14 — ABNORMAL LOW
GLUCOSE: 99 mg/dL (ref 65–99)
Osmolality: 297 (ref 275–301)
Potassium: 4.2 mmol/L (ref 3.5–5.1)
Sodium: 142 mmol/L (ref 136–145)

## 2013-11-23 LAB — URIC ACID: Uric Acid: 6.9 mg/dL (ref 3.5–7.2)

## 2013-11-26 LAB — CREATININE CLEARANCE, URINE, 24 HOUR
CREATININE CLEARANCE: 12 mL/min — AB (ref 80–130)
Collection Hours: 24 hours
Creatinine, Serum: 3.88 mg/dL — ABNORMAL HIGH (ref 0.50–1.20)
Creatinine, Urine: 63.2 mg/dL (ref 30.0–125.0)
Total Volume: 1400 mL

## 2013-12-10 ENCOUNTER — Inpatient Hospital Stay: Payer: Self-pay | Admitting: Internal Medicine

## 2013-12-10 ENCOUNTER — Ambulatory Visit: Payer: Self-pay | Admitting: Student

## 2013-12-10 LAB — CBC WITH DIFFERENTIAL/PLATELET
BASOS ABS: 0 10*3/uL (ref 0.0–0.1)
Basophil %: 0.2 %
EOS PCT: 0.4 %
Eosinophil #: 0 10*3/uL (ref 0.0–0.7)
HCT: 26.8 % — AB (ref 40.0–52.0)
HGB: 9 g/dL — ABNORMAL LOW (ref 13.0–18.0)
Lymphocyte #: 0.3 10*3/uL — ABNORMAL LOW (ref 1.0–3.6)
Lymphocyte %: 3.2 %
MCH: 31.7 pg (ref 26.0–34.0)
MCHC: 33.5 g/dL (ref 32.0–36.0)
MCV: 95 fL (ref 80–100)
MONO ABS: 0.4 x10 3/mm (ref 0.2–1.0)
Monocyte %: 4.1 %
Neutrophil #: 7.9 10*3/uL — ABNORMAL HIGH (ref 1.4–6.5)
Neutrophil %: 92.1 %
PLATELETS: 135 10*3/uL — AB (ref 150–440)
RBC: 2.83 10*6/uL — AB (ref 4.40–5.90)
RDW: 14.7 % — AB (ref 11.5–14.5)
WBC: 8.6 10*3/uL (ref 3.8–10.6)

## 2013-12-10 LAB — TROPONIN I
TROPONIN-I: 0.14 ng/mL — AB
Troponin-I: 0.28 ng/mL — ABNORMAL HIGH

## 2013-12-10 LAB — URINALYSIS, COMPLETE
Bilirubin,UR: NEGATIVE
GLUCOSE, UR: NEGATIVE mg/dL (ref 0–75)
Ketone: NEGATIVE
Leukocyte Esterase: NEGATIVE
Nitrite: NEGATIVE
PH: 5 (ref 4.5–8.0)
RBC,UR: 2 /HPF (ref 0–5)
SQUAMOUS EPITHELIAL: NONE SEEN
Specific Gravity: 1.012 (ref 1.003–1.030)
WBC UR: 5 /HPF (ref 0–5)

## 2013-12-10 LAB — BASIC METABOLIC PANEL
Anion Gap: 10 (ref 7–16)
BUN: 58 mg/dL — AB (ref 7–18)
Calcium, Total: 8.4 mg/dL — ABNORMAL LOW (ref 8.5–10.1)
Chloride: 95 mmol/L — ABNORMAL LOW (ref 98–107)
Co2: 28 mmol/L (ref 21–32)
Creatinine: 4.68 mg/dL — ABNORMAL HIGH (ref 0.60–1.30)
EGFR (African American): 13 — ABNORMAL LOW
GFR CALC NON AF AMER: 11 — AB
Glucose: 129 mg/dL — ABNORMAL HIGH (ref 65–99)
OSMOLALITY: 284 (ref 275–301)
Potassium: 3.9 mmol/L (ref 3.5–5.1)
Sodium: 133 mmol/L — ABNORMAL LOW (ref 136–145)

## 2013-12-10 LAB — RAPID INFLUENZA A&B ANTIGENS (ARMC ONLY)

## 2013-12-10 LAB — LIPASE, BLOOD: Lipase: 357 U/L (ref 73–393)

## 2013-12-10 LAB — MAGNESIUM: Magnesium: 1.6 mg/dL — ABNORMAL LOW

## 2013-12-10 LAB — PROTIME-INR
INR: 1.1
PROTHROMBIN TIME: 14.3 s (ref 11.5–14.7)

## 2013-12-10 LAB — HEPATIC FUNCTION PANEL A (ARMC)
ALK PHOS: 70 U/L
Albumin: 3 g/dL — ABNORMAL LOW (ref 3.4–5.0)
BILIRUBIN DIRECT: 0.1 mg/dL (ref 0.00–0.20)
Bilirubin,Total: 0.5 mg/dL (ref 0.2–1.0)
SGOT(AST): 40 U/L — ABNORMAL HIGH (ref 15–37)
SGPT (ALT): 21 U/L (ref 12–78)
TOTAL PROTEIN: 7.2 g/dL (ref 6.4–8.2)

## 2013-12-10 LAB — CK TOTAL AND CKMB (NOT AT ARMC)
CK, TOTAL: 590 U/L — AB (ref 35–232)
CK-MB: 1.3 ng/mL (ref 0.5–3.6)

## 2013-12-10 LAB — APTT: Activated PTT: 28.5 secs (ref 23.6–35.9)

## 2013-12-11 LAB — BASIC METABOLIC PANEL
ANION GAP: 8 (ref 7–16)
BUN: 65 mg/dL — AB (ref 7–18)
CREATININE: 5.52 mg/dL — AB (ref 0.60–1.30)
Calcium, Total: 8.5 mg/dL (ref 8.5–10.1)
Chloride: 92 mmol/L — ABNORMAL LOW (ref 98–107)
Co2: 29 mmol/L (ref 21–32)
GFR CALC AF AMER: 10 — AB
GFR CALC NON AF AMER: 9 — AB
GLUCOSE: 109 mg/dL — AB (ref 65–99)
Osmolality: 278 (ref 275–301)
Potassium: 3.7 mmol/L (ref 3.5–5.1)
Sodium: 129 mmol/L — ABNORMAL LOW (ref 136–145)

## 2013-12-11 LAB — CBC WITH DIFFERENTIAL/PLATELET
Basophil #: 0 10*3/uL (ref 0.0–0.1)
Basophil %: 0.2 %
Eosinophil #: 0 10*3/uL (ref 0.0–0.7)
Eosinophil %: 0.1 %
HCT: 25.5 % — AB (ref 40.0–52.0)
HGB: 8.6 g/dL — ABNORMAL LOW (ref 13.0–18.0)
LYMPHS ABS: 0.4 10*3/uL — AB (ref 1.0–3.6)
Lymphocyte %: 6.6 %
MCH: 31.8 pg (ref 26.0–34.0)
MCHC: 33.9 g/dL (ref 32.0–36.0)
MCV: 94 fL (ref 80–100)
MONO ABS: 0.2 x10 3/mm (ref 0.2–1.0)
Monocyte %: 2.9 %
Neutrophil #: 5.1 10*3/uL (ref 1.4–6.5)
Neutrophil %: 90.2 %
PLATELETS: 120 10*3/uL — AB (ref 150–440)
RBC: 2.71 10*6/uL — ABNORMAL LOW (ref 4.40–5.90)
RDW: 15 % — AB (ref 11.5–14.5)
WBC: 5.6 10*3/uL (ref 3.8–10.6)

## 2013-12-11 LAB — PROTIME-INR
INR: 1
PROTHROMBIN TIME: 12.9 s (ref 11.5–14.7)

## 2013-12-11 LAB — CK-MB
CK-MB: 0.9 ng/mL (ref 0.5–3.6)
CK-MB: 2 ng/mL (ref 0.5–3.6)
CK-MB: 1.8 ng/mL (ref 0.5–3.6)

## 2013-12-11 LAB — MAGNESIUM: Magnesium: 2.2 mg/dL

## 2013-12-11 LAB — PHOSPHORUS: PHOSPHORUS: 4.1 mg/dL (ref 2.5–4.9)

## 2013-12-11 LAB — CK TOTAL AND CKMB (NOT AT ARMC)
CK, TOTAL: 809 U/L — AB (ref 35–232)
CK-MB: 1.3 ng/mL (ref 0.5–3.6)

## 2013-12-11 LAB — TROPONIN I: Troponin-I: 0.43 ng/mL — ABNORMAL HIGH

## 2013-12-12 ENCOUNTER — Ambulatory Visit: Payer: Self-pay | Admitting: Internal Medicine

## 2013-12-12 LAB — CBC WITH DIFFERENTIAL/PLATELET
BASOS ABS: 0 10*3/uL (ref 0.0–0.1)
BASOS PCT: 0.4 %
Eosinophil #: 0 10*3/uL (ref 0.0–0.7)
Eosinophil %: 0.8 %
HCT: 24.9 % — AB (ref 40.0–52.0)
HGB: 8.4 g/dL — ABNORMAL LOW (ref 13.0–18.0)
LYMPHS PCT: 11.4 %
Lymphocyte #: 0.5 10*3/uL — ABNORMAL LOW (ref 1.0–3.6)
MCH: 31.6 pg (ref 26.0–34.0)
MCHC: 33.7 g/dL (ref 32.0–36.0)
MCV: 94 fL (ref 80–100)
MONOS PCT: 4.9 %
Monocyte #: 0.2 x10 3/mm (ref 0.2–1.0)
NEUTROS PCT: 82.5 %
Neutrophil #: 3.6 10*3/uL (ref 1.4–6.5)
PLATELETS: 118 10*3/uL — AB (ref 150–440)
RBC: 2.65 10*6/uL — AB (ref 4.40–5.90)
RDW: 14.9 % — ABNORMAL HIGH (ref 11.5–14.5)
WBC: 4.4 10*3/uL (ref 3.8–10.6)

## 2013-12-12 LAB — HEPATIC FUNCTION PANEL A (ARMC)
AST: 42 U/L — AB (ref 15–37)
Albumin: 2.4 g/dL — ABNORMAL LOW (ref 3.4–5.0)
Alkaline Phosphatase: 53 U/L
BILIRUBIN DIRECT: 0.2 mg/dL (ref 0.00–0.20)
Bilirubin,Total: 0.4 mg/dL (ref 0.2–1.0)
SGPT (ALT): 15 U/L (ref 12–78)
Total Protein: 6.3 g/dL — ABNORMAL LOW (ref 6.4–8.2)

## 2013-12-12 LAB — URINALYSIS, COMPLETE
Bilirubin,UR: NEGATIVE
Glucose,UR: NEGATIVE mg/dL (ref 0–75)
Ketone: NEGATIVE
Nitrite: NEGATIVE
Ph: 5 (ref 4.5–8.0)
RBC,UR: 32 /HPF (ref 0–5)
SQUAMOUS EPITHELIAL: NONE SEEN
Specific Gravity: 1.014 (ref 1.003–1.030)
WBC UR: 41 /HPF (ref 0–5)

## 2013-12-12 LAB — BASIC METABOLIC PANEL
Anion Gap: 4 — ABNORMAL LOW (ref 7–16)
BUN: 34 mg/dL — ABNORMAL HIGH (ref 7–18)
CALCIUM: 8.1 mg/dL — AB (ref 8.5–10.1)
CHLORIDE: 95 mmol/L — AB (ref 98–107)
CO2: 31 mmol/L (ref 21–32)
Creatinine: 4.1 mg/dL — ABNORMAL HIGH (ref 0.60–1.30)
EGFR (Non-African Amer.): 13 — ABNORMAL LOW
GFR CALC AF AMER: 15 — AB
Glucose: 89 mg/dL (ref 65–99)
OSMOLALITY: 268 (ref 275–301)
POTASSIUM: 3.5 mmol/L (ref 3.5–5.1)
SODIUM: 130 mmol/L — AB (ref 136–145)

## 2013-12-13 LAB — PHOSPHORUS: Phosphorus: 4.8 mg/dL (ref 2.5–4.9)

## 2013-12-14 LAB — BASIC METABOLIC PANEL
ANION GAP: 6 — AB (ref 7–16)
BUN: 39 mg/dL — AB (ref 7–18)
CO2: 31 mmol/L (ref 21–32)
Calcium, Total: 7.9 mg/dL — ABNORMAL LOW (ref 8.5–10.1)
Chloride: 97 mmol/L — ABNORMAL LOW (ref 98–107)
Creatinine: 4.81 mg/dL — ABNORMAL HIGH (ref 0.60–1.30)
EGFR (African American): 12 — ABNORMAL LOW
EGFR (Non-African Amer.): 11 — ABNORMAL LOW
GLUCOSE: 94 mg/dL (ref 65–99)
OSMOLALITY: 277 (ref 275–301)
Potassium: 3.8 mmol/L (ref 3.5–5.1)
SODIUM: 134 mmol/L — AB (ref 136–145)

## 2013-12-14 LAB — PHOSPHORUS: Phosphorus: 4.3 mg/dL (ref 2.5–4.9)

## 2013-12-14 LAB — CBC WITH DIFFERENTIAL/PLATELET
Basophil #: 0 10*3/uL (ref 0.0–0.1)
Basophil %: 0.4 %
EOS PCT: 2.2 %
Eosinophil #: 0.1 10*3/uL (ref 0.0–0.7)
HCT: 23.6 % — AB (ref 40.0–52.0)
HGB: 7.7 g/dL — ABNORMAL LOW (ref 13.0–18.0)
LYMPHS ABS: 0.6 10*3/uL — AB (ref 1.0–3.6)
Lymphocyte %: 18.2 %
MCH: 30.4 pg (ref 26.0–34.0)
MCHC: 32.6 g/dL (ref 32.0–36.0)
MCV: 93 fL (ref 80–100)
MONO ABS: 0.2 x10 3/mm (ref 0.2–1.0)
MONOS PCT: 6 %
NEUTROS ABS: 2.5 10*3/uL (ref 1.4–6.5)
Neutrophil %: 73.2 %
Platelet: 129 10*3/uL — ABNORMAL LOW (ref 150–440)
RBC: 2.53 10*6/uL — ABNORMAL LOW (ref 4.40–5.90)
RDW: 14.7 % — ABNORMAL HIGH (ref 11.5–14.5)
WBC: 3.5 10*3/uL — AB (ref 3.8–10.6)

## 2013-12-14 LAB — HEMOGLOBIN: HGB: 8.8 g/dL — ABNORMAL LOW (ref 13.0–18.0)

## 2013-12-14 LAB — URINE CULTURE

## 2013-12-15 LAB — BASIC METABOLIC PANEL
Anion Gap: 16 (ref 7–16)
BUN: 30 mg/dL — ABNORMAL HIGH (ref 7–18)
CHLORIDE: 96 mmol/L — AB (ref 98–107)
Calcium, Total: 8.3 mg/dL — ABNORMAL LOW (ref 8.5–10.1)
Co2: 21 mmol/L (ref 21–32)
Creatinine: 4.04 mg/dL — ABNORMAL HIGH (ref 0.60–1.30)
EGFR (African American): 15 — ABNORMAL LOW
GFR CALC NON AF AMER: 13 — AB
Glucose: 96 mg/dL (ref 65–99)
OSMOLALITY: 272 (ref 275–301)
POTASSIUM: 3.9 mmol/L (ref 3.5–5.1)
Sodium: 133 mmol/L — ABNORMAL LOW (ref 136–145)

## 2013-12-15 LAB — CBC WITH DIFFERENTIAL/PLATELET
Basophil #: 0 10*3/uL (ref 0.0–0.1)
Basophil %: 0.7 %
Eosinophil #: 0.2 10*3/uL (ref 0.0–0.7)
Eosinophil %: 3.5 %
HCT: 25.1 % — ABNORMAL LOW (ref 40.0–52.0)
HGB: 8.6 g/dL — ABNORMAL LOW (ref 13.0–18.0)
LYMPHS ABS: 0.6 10*3/uL — AB (ref 1.0–3.6)
Lymphocyte %: 13.8 %
MCH: 31.4 pg (ref 26.0–34.0)
MCHC: 34.3 g/dL (ref 32.0–36.0)
MCV: 92 fL (ref 80–100)
MONO ABS: 0.2 x10 3/mm (ref 0.2–1.0)
Monocyte %: 5.1 %
Neutrophil #: 3.6 10*3/uL (ref 1.4–6.5)
Neutrophil %: 76.9 %
PLATELETS: 145 10*3/uL — AB (ref 150–440)
RBC: 2.74 10*6/uL — AB (ref 4.40–5.90)
RDW: 16.1 % — ABNORMAL HIGH (ref 11.5–14.5)
WBC: 4.7 10*3/uL (ref 3.8–10.6)

## 2013-12-15 LAB — CULTURE, BLOOD (SINGLE)

## 2013-12-16 LAB — BASIC METABOLIC PANEL
ANION GAP: 3 — AB (ref 7–16)
BUN: 37 mg/dL — AB (ref 7–18)
CREATININE: 4.64 mg/dL — AB (ref 0.60–1.30)
Calcium, Total: 8 mg/dL — ABNORMAL LOW (ref 8.5–10.1)
Chloride: 95 mmol/L — ABNORMAL LOW (ref 98–107)
Co2: 32 mmol/L (ref 21–32)
GFR CALC AF AMER: 13 — AB
GFR CALC NON AF AMER: 11 — AB
Glucose: 123 mg/dL — ABNORMAL HIGH (ref 65–99)
Osmolality: 271 (ref 275–301)
Potassium: 4.3 mmol/L (ref 3.5–5.1)
SODIUM: 130 mmol/L — AB (ref 136–145)

## 2013-12-16 LAB — VANCOMYCIN, TROUGH: Vancomycin, Trough: 9 ug/mL — ABNORMAL LOW (ref 10–20)

## 2013-12-16 LAB — PLATELET COUNT: Platelet: 157 10*3/uL (ref 150–440)

## 2013-12-16 LAB — HEMOGLOBIN: HGB: 7.9 g/dL — AB (ref 13.0–18.0)

## 2013-12-16 LAB — PHOSPHORUS: Phosphorus: 5.1 mg/dL — ABNORMAL HIGH (ref 2.5–4.9)

## 2013-12-17 LAB — BASIC METABOLIC PANEL
Anion Gap: 5 — ABNORMAL LOW (ref 7–16)
BUN: 27 mg/dL — ABNORMAL HIGH (ref 7–18)
Calcium, Total: 8 mg/dL — ABNORMAL LOW (ref 8.5–10.1)
Chloride: 98 mmol/L (ref 98–107)
Co2: 32 mmol/L (ref 21–32)
Creatinine: 3.36 mg/dL — ABNORMAL HIGH (ref 0.60–1.30)
EGFR (African American): 19 — ABNORMAL LOW
GFR CALC NON AF AMER: 16 — AB
Glucose: 105 mg/dL — ABNORMAL HIGH (ref 65–99)
Osmolality: 276 (ref 275–301)
Potassium: 4 mmol/L (ref 3.5–5.1)
Sodium: 135 mmol/L — ABNORMAL LOW (ref 136–145)

## 2013-12-17 LAB — PLATELET COUNT: Platelet: 184 10*3/uL (ref 150–440)

## 2013-12-17 LAB — HEMOGLOBIN: HGB: 7.4 g/dL — ABNORMAL LOW (ref 13.0–18.0)

## 2013-12-17 LAB — CULTURE, BLOOD (SINGLE)

## 2013-12-18 LAB — CBC WITH DIFFERENTIAL/PLATELET
Basophil #: 0 10*3/uL (ref 0.0–0.1)
Basophil %: 0.4 %
Eosinophil #: 0.1 10*3/uL (ref 0.0–0.7)
Eosinophil %: 1.8 %
HCT: 22.5 % — ABNORMAL LOW (ref 40.0–52.0)
HGB: 7.5 g/dL — ABNORMAL LOW (ref 13.0–18.0)
LYMPHS ABS: 0.6 10*3/uL — AB (ref 1.0–3.6)
LYMPHS PCT: 8.7 %
MCH: 30.2 pg (ref 26.0–34.0)
MCHC: 33.4 g/dL (ref 32.0–36.0)
MCV: 90 fL (ref 80–100)
Monocyte #: 0.4 x10 3/mm (ref 0.2–1.0)
Monocyte %: 5.4 %
Neutrophil #: 5.6 10*3/uL (ref 1.4–6.5)
Neutrophil %: 83.7 %
Platelet: 219 10*3/uL (ref 150–440)
RBC: 2.49 10*6/uL — AB (ref 4.40–5.90)
RDW: 16.8 % — ABNORMAL HIGH (ref 11.5–14.5)
WBC: 6.7 10*3/uL (ref 3.8–10.6)

## 2013-12-18 LAB — PHOSPHORUS: PHOSPHORUS: 3.1 mg/dL (ref 2.5–4.9)

## 2013-12-19 LAB — CBC WITH DIFFERENTIAL/PLATELET
Basophil #: 0 10*3/uL (ref 0.0–0.1)
Basophil %: 0.4 %
EOS ABS: 0.1 10*3/uL (ref 0.0–0.7)
Eosinophil %: 1 %
HCT: 25.7 % — ABNORMAL LOW (ref 40.0–52.0)
HGB: 8.7 g/dL — ABNORMAL LOW (ref 13.0–18.0)
Lymphocyte #: 0.7 10*3/uL — ABNORMAL LOW (ref 1.0–3.6)
Lymphocyte %: 9.2 %
MCH: 30.8 pg (ref 26.0–34.0)
MCHC: 34 g/dL (ref 32.0–36.0)
MCV: 91 fL (ref 80–100)
MONOS PCT: 7.8 %
Monocyte #: 0.6 x10 3/mm (ref 0.2–1.0)
Neutrophil #: 5.8 10*3/uL (ref 1.4–6.5)
Neutrophil %: 81.6 %
PLATELETS: 226 10*3/uL (ref 150–440)
RBC: 2.83 10*6/uL — AB (ref 4.40–5.90)
RDW: 16.6 % — ABNORMAL HIGH (ref 11.5–14.5)
WBC: 7.1 10*3/uL (ref 3.8–10.6)

## 2014-01-19 ENCOUNTER — Ambulatory Visit: Payer: Self-pay | Admitting: Internal Medicine

## 2014-01-19 LAB — CBC CANCER CENTER
BASOS PCT: 0.6 %
Basophil #: 0 x10 3/mm (ref 0.0–0.1)
Eosinophil #: 0.3 x10 3/mm (ref 0.0–0.7)
Eosinophil %: 6.8 %
HCT: 31.2 % — AB (ref 40.0–52.0)
HGB: 10.1 g/dL — AB (ref 13.0–18.0)
LYMPHS ABS: 1.2 x10 3/mm (ref 1.0–3.6)
LYMPHS PCT: 23.9 %
MCH: 29.9 pg (ref 26.0–34.0)
MCHC: 32.3 g/dL (ref 32.0–36.0)
MCV: 93 fL (ref 80–100)
Monocyte #: 0.3 x10 3/mm (ref 0.2–1.0)
Monocyte %: 5.3 %
Neutrophil #: 3.1 x10 3/mm (ref 1.4–6.5)
Neutrophil %: 63.4 %
PLATELETS: 182 x10 3/mm (ref 150–440)
RBC: 3.37 10*6/uL — ABNORMAL LOW (ref 4.40–5.90)
RDW: 17.9 % — AB (ref 11.5–14.5)
WBC: 4.9 x10 3/mm (ref 3.8–10.6)

## 2014-01-20 LAB — PROT IMMUNOELECTROPHORES(ARMC)

## 2014-01-20 LAB — KAPPA/LAMBDA FREE LIGHT CHAINS (ARMC)

## 2014-02-08 ENCOUNTER — Emergency Department: Payer: Self-pay | Admitting: Emergency Medicine

## 2014-02-08 ENCOUNTER — Ambulatory Visit: Payer: Self-pay | Admitting: Emergency Medicine

## 2014-02-08 LAB — CBC
HCT: 35.8 % — AB (ref 40.0–52.0)
HGB: 11.7 g/dL — ABNORMAL LOW (ref 13.0–18.0)
MCH: 31.3 pg (ref 26.0–34.0)
MCHC: 32.7 g/dL (ref 32.0–36.0)
MCV: 96 fL (ref 80–100)
PLATELETS: 215 10*3/uL (ref 150–440)
RBC: 3.74 10*6/uL — ABNORMAL LOW (ref 4.40–5.90)
RDW: 18.1 % — ABNORMAL HIGH (ref 11.5–14.5)
WBC: 5 10*3/uL (ref 3.8–10.6)

## 2014-02-08 LAB — COMPREHENSIVE METABOLIC PANEL
ALBUMIN: 3.3 g/dL — AB (ref 3.4–5.0)
ANION GAP: 7 (ref 7–16)
Alkaline Phosphatase: 102 U/L
BUN: 24 mg/dL — AB (ref 7–18)
Bilirubin,Total: 0.6 mg/dL (ref 0.2–1.0)
Calcium, Total: 9 mg/dL (ref 8.5–10.1)
Chloride: 98 mmol/L (ref 98–107)
Co2: 34 mmol/L — ABNORMAL HIGH (ref 21–32)
Creatinine: 2.96 mg/dL — ABNORMAL HIGH (ref 0.60–1.30)
EGFR (Non-African Amer.): 19 — ABNORMAL LOW
GFR CALC AF AMER: 22 — AB
Glucose: 115 mg/dL — ABNORMAL HIGH (ref 65–99)
OSMOLALITY: 283 (ref 275–301)
Potassium: 3.5 mmol/L (ref 3.5–5.1)
SGOT(AST): 20 U/L (ref 15–37)
SGPT (ALT): 18 U/L (ref 12–78)
Sodium: 139 mmol/L (ref 136–145)
Total Protein: 7.6 g/dL (ref 6.4–8.2)

## 2014-02-09 ENCOUNTER — Ambulatory Visit: Payer: Self-pay | Admitting: Internal Medicine

## 2014-03-28 LAB — PATHOLOGY REPORT

## 2014-05-10 ENCOUNTER — Ambulatory Visit: Payer: Self-pay | Admitting: Internal Medicine

## 2014-05-10 LAB — CBC CANCER CENTER
BASOS PCT: 0.4 %
Basophil #: 0 x10 3/mm (ref 0.0–0.1)
EOS ABS: 0.4 x10 3/mm (ref 0.0–0.7)
Eosinophil %: 4.7 %
HCT: 34.1 % — AB (ref 40.0–52.0)
HGB: 11.6 g/dL — AB (ref 13.0–18.0)
LYMPHS PCT: 31.8 %
Lymphocyte #: 2.4 x10 3/mm (ref 1.0–3.6)
MCH: 34.2 pg — AB (ref 26.0–34.0)
MCHC: 34 g/dL (ref 32.0–36.0)
MCV: 101 fL — AB (ref 80–100)
Monocyte #: 0.5 x10 3/mm (ref 0.2–1.0)
Monocyte %: 6.4 %
NEUTROS PCT: 56.7 %
Neutrophil #: 4.3 x10 3/mm (ref 1.4–6.5)
PLATELETS: 174 x10 3/mm (ref 150–440)
RBC: 3.39 10*6/uL — ABNORMAL LOW (ref 4.40–5.90)
RDW: 14.5 % (ref 11.5–14.5)
WBC: 7.6 x10 3/mm (ref 3.8–10.6)

## 2014-05-10 LAB — SEDIMENTATION RATE: ERYTHROCYTE SED RATE: 64 mm/h — AB (ref 0–20)

## 2014-05-11 ENCOUNTER — Ambulatory Visit: Payer: Self-pay | Admitting: Internal Medicine

## 2014-05-11 LAB — KAPPA/LAMBDA FREE LIGHT CHAINS (ARMC)

## 2014-07-05 ENCOUNTER — Ambulatory Visit: Payer: Self-pay | Admitting: Internal Medicine

## 2014-07-05 LAB — CBC CANCER CENTER
BASOS PCT: 0.5 %
Basophil #: 0 x10 3/mm (ref 0.0–0.1)
EOS ABS: 0.4 x10 3/mm (ref 0.0–0.7)
Eosinophil %: 5.6 %
HCT: 30.2 % — AB (ref 40.0–52.0)
HGB: 10.2 g/dL — ABNORMAL LOW (ref 13.0–18.0)
LYMPHS PCT: 29.3 %
Lymphocyte #: 1.9 x10 3/mm (ref 1.0–3.6)
MCH: 34.6 pg — AB (ref 26.0–34.0)
MCHC: 33.6 g/dL (ref 32.0–36.0)
MCV: 103 fL — ABNORMAL HIGH (ref 80–100)
Monocyte #: 0.4 x10 3/mm (ref 0.2–1.0)
Monocyte %: 6.3 %
NEUTROS ABS: 3.8 x10 3/mm (ref 1.4–6.5)
Neutrophil %: 58.3 %
PLATELETS: 218 x10 3/mm (ref 150–440)
RBC: 2.94 10*6/uL — AB (ref 4.40–5.90)
RDW: 13.9 % (ref 11.5–14.5)
WBC: 6.5 x10 3/mm (ref 3.8–10.6)

## 2014-07-07 LAB — PROT IMMUNOELECTROPHORES(ARMC)

## 2014-07-07 LAB — KAPPA/LAMBDA FREE LIGHT CHAINS (ARMC)

## 2014-07-12 ENCOUNTER — Ambulatory Visit: Payer: Self-pay | Admitting: Internal Medicine

## 2014-08-30 ENCOUNTER — Ambulatory Visit: Payer: Self-pay | Admitting: Internal Medicine

## 2014-09-06 LAB — CBC CANCER CENTER
BASOS PCT: 0.5 %
Basophil #: 0 x10 3/mm (ref 0.0–0.1)
EOS PCT: 5.9 %
Eosinophil #: 0.3 x10 3/mm (ref 0.0–0.7)
HCT: 40.9 % (ref 40.0–52.0)
HGB: 13.9 g/dL (ref 13.0–18.0)
LYMPHS PCT: 34.5 %
Lymphocyte #: 1.9 x10 3/mm (ref 1.0–3.6)
MCH: 34.1 pg — AB (ref 26.0–34.0)
MCHC: 34 g/dL (ref 32.0–36.0)
MCV: 100 fL (ref 80–100)
Monocyte #: 0.3 x10 3/mm (ref 0.2–1.0)
Monocyte %: 5 %
NEUTROS PCT: 54.1 %
Neutrophil #: 3 x10 3/mm (ref 1.4–6.5)
Platelet: 189 x10 3/mm (ref 150–440)
RBC: 4.07 10*6/uL — ABNORMAL LOW (ref 4.40–5.90)
RDW: 14.3 % (ref 11.5–14.5)
WBC: 5.5 x10 3/mm (ref 3.8–10.6)

## 2014-09-07 LAB — PROT IMMUNOELECTROPHORES(ARMC)

## 2014-09-07 LAB — KAPPA/LAMBDA FREE LIGHT CHAINS (ARMC)

## 2014-09-11 ENCOUNTER — Ambulatory Visit: Payer: Self-pay | Admitting: Internal Medicine

## 2014-09-29 ENCOUNTER — Emergency Department: Payer: Self-pay | Admitting: Emergency Medicine

## 2014-09-29 LAB — LIPASE, BLOOD: Lipase: 250 U/L (ref 73–393)

## 2014-09-29 LAB — COMPREHENSIVE METABOLIC PANEL
ALK PHOS: 91 U/L
Albumin: 3.3 g/dL — ABNORMAL LOW (ref 3.4–5.0)
Anion Gap: 7 (ref 7–16)
BILIRUBIN TOTAL: 0.6 mg/dL (ref 0.2–1.0)
BUN: 23 mg/dL — AB (ref 7–18)
CO2: 35 mmol/L — AB (ref 21–32)
Calcium, Total: 8.7 mg/dL (ref 8.5–10.1)
Chloride: 98 mmol/L (ref 98–107)
Creatinine: 4.49 mg/dL — ABNORMAL HIGH (ref 0.60–1.30)
EGFR (African American): 16 — ABNORMAL LOW
EGFR (Non-African Amer.): 14 — ABNORMAL LOW
GLUCOSE: 117 mg/dL — AB (ref 65–99)
Osmolality: 284 (ref 275–301)
POTASSIUM: 3.8 mmol/L (ref 3.5–5.1)
SGOT(AST): 16 U/L (ref 15–37)
SGPT (ALT): 14 U/L
SODIUM: 140 mmol/L (ref 136–145)
Total Protein: 7.4 g/dL (ref 6.4–8.2)

## 2014-09-29 LAB — CBC
HCT: 34.5 % — ABNORMAL LOW (ref 40.0–52.0)
HGB: 11.3 g/dL — ABNORMAL LOW (ref 13.0–18.0)
MCH: 33 pg (ref 26.0–34.0)
MCHC: 32.9 g/dL (ref 32.0–36.0)
MCV: 101 fL — AB (ref 80–100)
Platelet: 155 10*3/uL (ref 150–440)
RBC: 3.43 10*6/uL — AB (ref 4.40–5.90)
RDW: 14.1 % (ref 11.5–14.5)
WBC: 4.2 10*3/uL (ref 3.8–10.6)

## 2014-09-29 LAB — TROPONIN I
Troponin-I: <0.02 ng/mL
Troponin-I: 0.02 ng/mL

## 2014-09-29 LAB — APTT: Activated PTT: 25.3 secs (ref 23.6–35.9)

## 2014-10-04 LAB — CBC CANCER CENTER
BASOS PCT: 0.6 %
Basophil #: 0 x10 3/mm (ref 0.0–0.1)
EOS ABS: 0.3 x10 3/mm (ref 0.0–0.7)
EOS PCT: 5.5 %
HCT: 37 % — ABNORMAL LOW (ref 40.0–52.0)
HGB: 12.1 g/dL — ABNORMAL LOW (ref 13.0–18.0)
Lymphocyte #: 1.6 x10 3/mm (ref 1.0–3.6)
Lymphocyte %: 28.9 %
MCH: 32.7 pg (ref 26.0–34.0)
MCHC: 32.8 g/dL (ref 32.0–36.0)
MCV: 100 fL (ref 80–100)
MONO ABS: 0.3 x10 3/mm (ref 0.2–1.0)
MONOS PCT: 5.1 %
NEUTROS ABS: 3.3 x10 3/mm (ref 1.4–6.5)
NEUTROS PCT: 59.9 %
Platelet: 167 x10 3/mm (ref 150–440)
RBC: 3.71 10*6/uL — ABNORMAL LOW (ref 4.40–5.90)
RDW: 13.7 % (ref 11.5–14.5)
WBC: 5.5 x10 3/mm (ref 3.8–10.6)

## 2014-10-05 LAB — KAPPA/LAMBDA FREE LIGHT CHAINS (ARMC)

## 2014-10-05 LAB — PROT IMMUNOELECTROPHORES(ARMC)

## 2014-10-11 ENCOUNTER — Ambulatory Visit: Payer: Self-pay | Admitting: Internal Medicine

## 2014-10-31 LAB — CBC CANCER CENTER
BASOS PCT: 0.6 %
Basophil #: 0 x10 3/mm (ref 0.0–0.1)
Eosinophil #: 0.2 x10 3/mm (ref 0.0–0.7)
Eosinophil %: 2.8 %
HCT: 37.7 % — ABNORMAL LOW (ref 40.0–52.0)
HGB: 12.4 g/dL — ABNORMAL LOW (ref 13.0–18.0)
Lymphocyte #: 2.1 x10 3/mm (ref 1.0–3.6)
Lymphocyte %: 32.9 %
MCH: 32.8 pg (ref 26.0–34.0)
MCHC: 33 g/dL (ref 32.0–36.0)
MCV: 99 fL (ref 80–100)
Monocyte #: 0.3 x10 3/mm (ref 0.2–1.0)
Monocyte %: 4.6 %
NEUTROS ABS: 3.8 x10 3/mm (ref 1.4–6.5)
Neutrophil %: 59.1 %
PLATELETS: 181 x10 3/mm (ref 150–440)
RBC: 3.8 10*6/uL — AB (ref 4.40–5.90)
RDW: 13.9 % (ref 11.5–14.5)
WBC: 6.4 x10 3/mm (ref 3.8–10.6)

## 2014-10-31 LAB — CALCIUM: Calcium, Total: 8.8 mg/dL (ref 8.5–10.1)

## 2014-11-01 LAB — PROT IMMUNOELECTROPHORES(ARMC)

## 2014-11-01 LAB — KAPPA/LAMBDA FREE LIGHT CHAINS (ARMC)

## 2014-11-11 ENCOUNTER — Ambulatory Visit: Payer: Self-pay | Admitting: Internal Medicine

## 2014-11-22 LAB — CBC CANCER CENTER
BASOS ABS: 0 x10 3/mm (ref 0.0–0.1)
Basophil %: 0.4 %
EOS ABS: 0.1 x10 3/mm (ref 0.0–0.7)
Eosinophil %: 1.9 %
HCT: 33.1 % — AB (ref 40.0–52.0)
HGB: 11 g/dL — ABNORMAL LOW (ref 13.0–18.0)
LYMPHS ABS: 1.4 x10 3/mm (ref 1.0–3.6)
Lymphocyte %: 21.6 %
MCH: 32.5 pg (ref 26.0–34.0)
MCHC: 33.1 g/dL (ref 32.0–36.0)
MCV: 98 fL (ref 80–100)
MONO ABS: 0.3 x10 3/mm (ref 0.2–1.0)
Monocyte %: 4.4 %
NEUTROS ABS: 4.5 x10 3/mm (ref 1.4–6.5)
Neutrophil %: 71.7 %
Platelet: 169 x10 3/mm (ref 150–440)
RBC: 3.38 10*6/uL — ABNORMAL LOW (ref 4.40–5.90)
RDW: 14.3 % (ref 11.5–14.5)
WBC: 6.3 x10 3/mm (ref 3.8–10.6)

## 2014-11-22 LAB — CALCIUM: Calcium, Total: 9.2 mg/dL (ref 8.5–10.1)

## 2014-11-23 LAB — PROT IMMUNOELECTROPHORES(ARMC)

## 2014-11-23 LAB — KAPPA/LAMBDA FREE LIGHT CHAINS (ARMC)

## 2014-12-12 ENCOUNTER — Ambulatory Visit: Payer: Self-pay | Admitting: Internal Medicine

## 2014-12-12 DIAGNOSIS — D509 Iron deficiency anemia, unspecified: Secondary | ICD-10-CM | POA: Diagnosis not present

## 2014-12-12 DIAGNOSIS — D689 Coagulation defect, unspecified: Secondary | ICD-10-CM | POA: Diagnosis not present

## 2014-12-12 DIAGNOSIS — Z23 Encounter for immunization: Secondary | ICD-10-CM | POA: Diagnosis not present

## 2014-12-12 DIAGNOSIS — N186 End stage renal disease: Secondary | ICD-10-CM | POA: Diagnosis not present

## 2014-12-14 DIAGNOSIS — N186 End stage renal disease: Secondary | ICD-10-CM | POA: Diagnosis not present

## 2014-12-14 DIAGNOSIS — Z23 Encounter for immunization: Secondary | ICD-10-CM | POA: Diagnosis not present

## 2014-12-14 DIAGNOSIS — D689 Coagulation defect, unspecified: Secondary | ICD-10-CM | POA: Diagnosis not present

## 2014-12-14 DIAGNOSIS — D509 Iron deficiency anemia, unspecified: Secondary | ICD-10-CM | POA: Diagnosis not present

## 2014-12-16 DIAGNOSIS — D689 Coagulation defect, unspecified: Secondary | ICD-10-CM | POA: Diagnosis not present

## 2014-12-16 DIAGNOSIS — N186 End stage renal disease: Secondary | ICD-10-CM | POA: Diagnosis not present

## 2014-12-16 DIAGNOSIS — D509 Iron deficiency anemia, unspecified: Secondary | ICD-10-CM | POA: Diagnosis not present

## 2014-12-16 DIAGNOSIS — Z23 Encounter for immunization: Secondary | ICD-10-CM | POA: Diagnosis not present

## 2014-12-19 DIAGNOSIS — Z23 Encounter for immunization: Secondary | ICD-10-CM | POA: Diagnosis not present

## 2014-12-19 DIAGNOSIS — D689 Coagulation defect, unspecified: Secondary | ICD-10-CM | POA: Diagnosis not present

## 2014-12-19 DIAGNOSIS — N186 End stage renal disease: Secondary | ICD-10-CM | POA: Diagnosis not present

## 2014-12-19 DIAGNOSIS — D509 Iron deficiency anemia, unspecified: Secondary | ICD-10-CM | POA: Diagnosis not present

## 2014-12-20 DIAGNOSIS — T82858A Stenosis of vascular prosthetic devices, implants and grafts, initial encounter: Secondary | ICD-10-CM | POA: Diagnosis not present

## 2014-12-20 DIAGNOSIS — D472 Monoclonal gammopathy: Secondary | ICD-10-CM | POA: Diagnosis not present

## 2014-12-20 DIAGNOSIS — C9000 Multiple myeloma not having achieved remission: Secondary | ICD-10-CM | POA: Diagnosis not present

## 2014-12-20 DIAGNOSIS — I871 Compression of vein: Secondary | ICD-10-CM | POA: Diagnosis not present

## 2014-12-21 DIAGNOSIS — N186 End stage renal disease: Secondary | ICD-10-CM | POA: Diagnosis not present

## 2014-12-21 DIAGNOSIS — D689 Coagulation defect, unspecified: Secondary | ICD-10-CM | POA: Diagnosis not present

## 2014-12-21 DIAGNOSIS — Z23 Encounter for immunization: Secondary | ICD-10-CM | POA: Diagnosis not present

## 2014-12-21 DIAGNOSIS — D509 Iron deficiency anemia, unspecified: Secondary | ICD-10-CM | POA: Diagnosis not present

## 2014-12-22 DIAGNOSIS — I10 Essential (primary) hypertension: Secondary | ICD-10-CM | POA: Diagnosis not present

## 2014-12-22 DIAGNOSIS — I499 Cardiac arrhythmia, unspecified: Secondary | ICD-10-CM | POA: Diagnosis not present

## 2014-12-22 DIAGNOSIS — E780 Pure hypercholesterolemia: Secondary | ICD-10-CM | POA: Diagnosis not present

## 2014-12-22 DIAGNOSIS — N186 End stage renal disease: Secondary | ICD-10-CM | POA: Diagnosis not present

## 2014-12-22 DIAGNOSIS — I2510 Atherosclerotic heart disease of native coronary artery without angina pectoris: Secondary | ICD-10-CM | POA: Diagnosis not present

## 2014-12-23 DIAGNOSIS — N186 End stage renal disease: Secondary | ICD-10-CM | POA: Diagnosis not present

## 2014-12-23 DIAGNOSIS — Z23 Encounter for immunization: Secondary | ICD-10-CM | POA: Diagnosis not present

## 2014-12-23 DIAGNOSIS — L570 Actinic keratosis: Secondary | ICD-10-CM | POA: Diagnosis not present

## 2014-12-23 DIAGNOSIS — D509 Iron deficiency anemia, unspecified: Secondary | ICD-10-CM | POA: Diagnosis not present

## 2014-12-23 DIAGNOSIS — D689 Coagulation defect, unspecified: Secondary | ICD-10-CM | POA: Diagnosis not present

## 2014-12-23 DIAGNOSIS — Z85828 Personal history of other malignant neoplasm of skin: Secondary | ICD-10-CM | POA: Diagnosis not present

## 2014-12-23 DIAGNOSIS — D492 Neoplasm of unspecified behavior of bone, soft tissue, and skin: Secondary | ICD-10-CM | POA: Diagnosis not present

## 2014-12-23 DIAGNOSIS — L820 Inflamed seborrheic keratosis: Secondary | ICD-10-CM | POA: Diagnosis not present

## 2014-12-26 DIAGNOSIS — Z23 Encounter for immunization: Secondary | ICD-10-CM | POA: Diagnosis not present

## 2014-12-26 DIAGNOSIS — N186 End stage renal disease: Secondary | ICD-10-CM | POA: Diagnosis not present

## 2014-12-26 DIAGNOSIS — D689 Coagulation defect, unspecified: Secondary | ICD-10-CM | POA: Diagnosis not present

## 2014-12-26 DIAGNOSIS — D509 Iron deficiency anemia, unspecified: Secondary | ICD-10-CM | POA: Diagnosis not present

## 2014-12-29 DIAGNOSIS — I2510 Atherosclerotic heart disease of native coronary artery without angina pectoris: Secondary | ICD-10-CM | POA: Diagnosis not present

## 2014-12-29 DIAGNOSIS — H33303 Unspecified retinal break, bilateral: Secondary | ICD-10-CM | POA: Diagnosis not present

## 2014-12-29 DIAGNOSIS — I10 Essential (primary) hypertension: Secondary | ICD-10-CM | POA: Diagnosis not present

## 2014-12-30 DIAGNOSIS — D689 Coagulation defect, unspecified: Secondary | ICD-10-CM | POA: Diagnosis not present

## 2014-12-30 DIAGNOSIS — N186 End stage renal disease: Secondary | ICD-10-CM | POA: Diagnosis not present

## 2014-12-30 DIAGNOSIS — D509 Iron deficiency anemia, unspecified: Secondary | ICD-10-CM | POA: Diagnosis not present

## 2014-12-30 DIAGNOSIS — Z23 Encounter for immunization: Secondary | ICD-10-CM | POA: Diagnosis not present

## 2015-01-02 DIAGNOSIS — Z23 Encounter for immunization: Secondary | ICD-10-CM | POA: Diagnosis not present

## 2015-01-02 DIAGNOSIS — D509 Iron deficiency anemia, unspecified: Secondary | ICD-10-CM | POA: Diagnosis not present

## 2015-01-02 DIAGNOSIS — D689 Coagulation defect, unspecified: Secondary | ICD-10-CM | POA: Diagnosis not present

## 2015-01-02 DIAGNOSIS — N186 End stage renal disease: Secondary | ICD-10-CM | POA: Diagnosis not present

## 2015-01-04 DIAGNOSIS — D689 Coagulation defect, unspecified: Secondary | ICD-10-CM | POA: Diagnosis not present

## 2015-01-04 DIAGNOSIS — N186 End stage renal disease: Secondary | ICD-10-CM | POA: Diagnosis not present

## 2015-01-04 DIAGNOSIS — Z23 Encounter for immunization: Secondary | ICD-10-CM | POA: Diagnosis not present

## 2015-01-04 DIAGNOSIS — D509 Iron deficiency anemia, unspecified: Secondary | ICD-10-CM | POA: Diagnosis not present

## 2015-01-05 DIAGNOSIS — I499 Cardiac arrhythmia, unspecified: Secondary | ICD-10-CM | POA: Diagnosis not present

## 2015-01-06 DIAGNOSIS — N186 End stage renal disease: Secondary | ICD-10-CM | POA: Diagnosis not present

## 2015-01-06 DIAGNOSIS — D509 Iron deficiency anemia, unspecified: Secondary | ICD-10-CM | POA: Diagnosis not present

## 2015-01-06 DIAGNOSIS — Z23 Encounter for immunization: Secondary | ICD-10-CM | POA: Diagnosis not present

## 2015-01-06 DIAGNOSIS — D689 Coagulation defect, unspecified: Secondary | ICD-10-CM | POA: Diagnosis not present

## 2015-01-09 DIAGNOSIS — Z23 Encounter for immunization: Secondary | ICD-10-CM | POA: Diagnosis not present

## 2015-01-09 DIAGNOSIS — Z992 Dependence on renal dialysis: Secondary | ICD-10-CM | POA: Diagnosis not present

## 2015-01-09 DIAGNOSIS — N186 End stage renal disease: Secondary | ICD-10-CM | POA: Diagnosis not present

## 2015-01-09 DIAGNOSIS — D509 Iron deficiency anemia, unspecified: Secondary | ICD-10-CM | POA: Diagnosis not present

## 2015-01-09 DIAGNOSIS — D689 Coagulation defect, unspecified: Secondary | ICD-10-CM | POA: Diagnosis not present

## 2015-01-09 DIAGNOSIS — I120 Hypertensive chronic kidney disease with stage 5 chronic kidney disease or end stage renal disease: Secondary | ICD-10-CM | POA: Diagnosis not present

## 2015-01-10 ENCOUNTER — Ambulatory Visit
Admit: 2015-01-10 | Disposition: A | Discharge status: Home / Self Care | Payer: Self-pay | Attending: Internal Medicine | Admitting: Internal Medicine

## 2015-01-11 DIAGNOSIS — Z23 Encounter for immunization: Secondary | ICD-10-CM | POA: Diagnosis not present

## 2015-01-11 DIAGNOSIS — D509 Iron deficiency anemia, unspecified: Secondary | ICD-10-CM | POA: Diagnosis not present

## 2015-01-11 DIAGNOSIS — D689 Coagulation defect, unspecified: Secondary | ICD-10-CM | POA: Diagnosis not present

## 2015-01-11 DIAGNOSIS — N186 End stage renal disease: Secondary | ICD-10-CM | POA: Diagnosis not present

## 2015-01-12 DIAGNOSIS — I2510 Atherosclerotic heart disease of native coronary artery without angina pectoris: Secondary | ICD-10-CM | POA: Diagnosis not present

## 2015-01-12 DIAGNOSIS — I10 Essential (primary) hypertension: Secondary | ICD-10-CM | POA: Diagnosis not present

## 2015-01-12 DIAGNOSIS — N186 End stage renal disease: Secondary | ICD-10-CM | POA: Diagnosis not present

## 2015-01-12 DIAGNOSIS — E780 Pure hypercholesterolemia: Secondary | ICD-10-CM | POA: Diagnosis not present

## 2015-01-16 DIAGNOSIS — D689 Coagulation defect, unspecified: Secondary | ICD-10-CM | POA: Diagnosis not present

## 2015-01-16 DIAGNOSIS — Z23 Encounter for immunization: Secondary | ICD-10-CM | POA: Diagnosis not present

## 2015-01-16 DIAGNOSIS — D509 Iron deficiency anemia, unspecified: Secondary | ICD-10-CM | POA: Diagnosis not present

## 2015-01-16 DIAGNOSIS — N186 End stage renal disease: Secondary | ICD-10-CM | POA: Diagnosis not present

## 2015-01-17 DIAGNOSIS — K7689 Other specified diseases of liver: Secondary | ICD-10-CM | POA: Diagnosis not present

## 2015-01-17 DIAGNOSIS — Z96642 Presence of left artificial hip joint: Secondary | ICD-10-CM | POA: Diagnosis not present

## 2015-01-17 DIAGNOSIS — C9000 Multiple myeloma not having achieved remission: Secondary | ICD-10-CM | POA: Diagnosis not present

## 2015-01-17 DIAGNOSIS — Z79899 Other long term (current) drug therapy: Secondary | ICD-10-CM | POA: Diagnosis not present

## 2015-01-17 DIAGNOSIS — Z992 Dependence on renal dialysis: Secondary | ICD-10-CM | POA: Diagnosis not present

## 2015-01-17 DIAGNOSIS — N186 End stage renal disease: Secondary | ICD-10-CM | POA: Diagnosis not present

## 2015-01-17 DIAGNOSIS — E65 Localized adiposity: Secondary | ICD-10-CM | POA: Diagnosis not present

## 2015-01-17 DIAGNOSIS — Z85828 Personal history of other malignant neoplasm of skin: Secondary | ICD-10-CM | POA: Diagnosis not present

## 2015-01-17 DIAGNOSIS — I252 Old myocardial infarction: Secondary | ICD-10-CM | POA: Diagnosis not present

## 2015-01-17 DIAGNOSIS — M19012 Primary osteoarthritis, left shoulder: Secondary | ICD-10-CM | POA: Diagnosis not present

## 2015-01-17 DIAGNOSIS — E611 Iron deficiency: Secondary | ICD-10-CM | POA: Diagnosis not present

## 2015-01-17 DIAGNOSIS — M5134 Other intervertebral disc degeneration, thoracic region: Secondary | ICD-10-CM | POA: Diagnosis not present

## 2015-01-17 DIAGNOSIS — R531 Weakness: Secondary | ICD-10-CM | POA: Diagnosis not present

## 2015-01-17 DIAGNOSIS — D472 Monoclonal gammopathy: Secondary | ICD-10-CM | POA: Diagnosis not present

## 2015-01-17 DIAGNOSIS — M19011 Primary osteoarthritis, right shoulder: Secondary | ICD-10-CM | POA: Diagnosis not present

## 2015-01-17 DIAGNOSIS — Z955 Presence of coronary angioplasty implant and graft: Secondary | ICD-10-CM | POA: Diagnosis not present

## 2015-01-18 DIAGNOSIS — Z23 Encounter for immunization: Secondary | ICD-10-CM | POA: Diagnosis not present

## 2015-01-18 DIAGNOSIS — D509 Iron deficiency anemia, unspecified: Secondary | ICD-10-CM | POA: Diagnosis not present

## 2015-01-18 DIAGNOSIS — D689 Coagulation defect, unspecified: Secondary | ICD-10-CM | POA: Diagnosis not present

## 2015-01-18 DIAGNOSIS — N186 End stage renal disease: Secondary | ICD-10-CM | POA: Diagnosis not present

## 2015-01-20 DIAGNOSIS — Z23 Encounter for immunization: Secondary | ICD-10-CM | POA: Diagnosis not present

## 2015-01-20 DIAGNOSIS — D509 Iron deficiency anemia, unspecified: Secondary | ICD-10-CM | POA: Diagnosis not present

## 2015-01-20 DIAGNOSIS — N186 End stage renal disease: Secondary | ICD-10-CM | POA: Diagnosis not present

## 2015-01-20 DIAGNOSIS — D689 Coagulation defect, unspecified: Secondary | ICD-10-CM | POA: Diagnosis not present

## 2015-01-23 DIAGNOSIS — I252 Old myocardial infarction: Secondary | ICD-10-CM | POA: Diagnosis not present

## 2015-01-23 DIAGNOSIS — D509 Iron deficiency anemia, unspecified: Secondary | ICD-10-CM | POA: Diagnosis not present

## 2015-01-23 DIAGNOSIS — Z23 Encounter for immunization: Secondary | ICD-10-CM | POA: Diagnosis not present

## 2015-01-23 DIAGNOSIS — K7689 Other specified diseases of liver: Secondary | ICD-10-CM | POA: Diagnosis not present

## 2015-01-23 DIAGNOSIS — D472 Monoclonal gammopathy: Secondary | ICD-10-CM | POA: Diagnosis not present

## 2015-01-23 DIAGNOSIS — M19012 Primary osteoarthritis, left shoulder: Secondary | ICD-10-CM | POA: Diagnosis not present

## 2015-01-23 DIAGNOSIS — C9000 Multiple myeloma not having achieved remission: Secondary | ICD-10-CM | POA: Diagnosis not present

## 2015-01-23 DIAGNOSIS — N186 End stage renal disease: Secondary | ICD-10-CM | POA: Diagnosis not present

## 2015-01-23 DIAGNOSIS — E611 Iron deficiency: Secondary | ICD-10-CM | POA: Diagnosis not present

## 2015-01-23 DIAGNOSIS — M5134 Other intervertebral disc degeneration, thoracic region: Secondary | ICD-10-CM | POA: Diagnosis not present

## 2015-01-23 DIAGNOSIS — E65 Localized adiposity: Secondary | ICD-10-CM | POA: Diagnosis not present

## 2015-01-23 DIAGNOSIS — Z96642 Presence of left artificial hip joint: Secondary | ICD-10-CM | POA: Diagnosis not present

## 2015-01-23 DIAGNOSIS — Z955 Presence of coronary angioplasty implant and graft: Secondary | ICD-10-CM | POA: Diagnosis not present

## 2015-01-23 DIAGNOSIS — Z79899 Other long term (current) drug therapy: Secondary | ICD-10-CM | POA: Diagnosis not present

## 2015-01-23 DIAGNOSIS — D689 Coagulation defect, unspecified: Secondary | ICD-10-CM | POA: Diagnosis not present

## 2015-01-23 DIAGNOSIS — R531 Weakness: Secondary | ICD-10-CM | POA: Diagnosis not present

## 2015-01-23 DIAGNOSIS — Z85828 Personal history of other malignant neoplasm of skin: Secondary | ICD-10-CM | POA: Diagnosis not present

## 2015-01-23 DIAGNOSIS — Z992 Dependence on renal dialysis: Secondary | ICD-10-CM | POA: Diagnosis not present

## 2015-01-23 DIAGNOSIS — M19011 Primary osteoarthritis, right shoulder: Secondary | ICD-10-CM | POA: Diagnosis not present

## 2015-01-27 DIAGNOSIS — N186 End stage renal disease: Secondary | ICD-10-CM | POA: Diagnosis not present

## 2015-01-27 DIAGNOSIS — D509 Iron deficiency anemia, unspecified: Secondary | ICD-10-CM | POA: Diagnosis not present

## 2015-01-27 DIAGNOSIS — Z23 Encounter for immunization: Secondary | ICD-10-CM | POA: Diagnosis not present

## 2015-01-27 DIAGNOSIS — D689 Coagulation defect, unspecified: Secondary | ICD-10-CM | POA: Diagnosis not present

## 2015-01-30 DIAGNOSIS — Z23 Encounter for immunization: Secondary | ICD-10-CM | POA: Diagnosis not present

## 2015-01-30 DIAGNOSIS — N186 End stage renal disease: Secondary | ICD-10-CM | POA: Diagnosis not present

## 2015-01-30 DIAGNOSIS — D689 Coagulation defect, unspecified: Secondary | ICD-10-CM | POA: Diagnosis not present

## 2015-01-30 DIAGNOSIS — D509 Iron deficiency anemia, unspecified: Secondary | ICD-10-CM | POA: Diagnosis not present

## 2015-02-01 DIAGNOSIS — Z23 Encounter for immunization: Secondary | ICD-10-CM | POA: Diagnosis not present

## 2015-02-01 DIAGNOSIS — N186 End stage renal disease: Secondary | ICD-10-CM | POA: Diagnosis not present

## 2015-02-01 DIAGNOSIS — D689 Coagulation defect, unspecified: Secondary | ICD-10-CM | POA: Diagnosis not present

## 2015-02-01 DIAGNOSIS — D509 Iron deficiency anemia, unspecified: Secondary | ICD-10-CM | POA: Diagnosis not present

## 2015-02-06 DIAGNOSIS — Z23 Encounter for immunization: Secondary | ICD-10-CM | POA: Diagnosis not present

## 2015-02-06 DIAGNOSIS — N186 End stage renal disease: Secondary | ICD-10-CM | POA: Diagnosis not present

## 2015-02-06 DIAGNOSIS — D509 Iron deficiency anemia, unspecified: Secondary | ICD-10-CM | POA: Diagnosis not present

## 2015-02-06 DIAGNOSIS — D689 Coagulation defect, unspecified: Secondary | ICD-10-CM | POA: Diagnosis not present

## 2015-02-08 DIAGNOSIS — D689 Coagulation defect, unspecified: Secondary | ICD-10-CM | POA: Diagnosis not present

## 2015-02-08 DIAGNOSIS — N186 End stage renal disease: Secondary | ICD-10-CM | POA: Diagnosis not present

## 2015-02-08 DIAGNOSIS — Z23 Encounter for immunization: Secondary | ICD-10-CM | POA: Diagnosis not present

## 2015-02-08 DIAGNOSIS — D509 Iron deficiency anemia, unspecified: Secondary | ICD-10-CM | POA: Diagnosis not present

## 2015-02-09 DIAGNOSIS — I120 Hypertensive chronic kidney disease with stage 5 chronic kidney disease or end stage renal disease: Secondary | ICD-10-CM | POA: Diagnosis not present

## 2015-02-09 DIAGNOSIS — N186 End stage renal disease: Secondary | ICD-10-CM | POA: Diagnosis not present

## 2015-02-09 DIAGNOSIS — Z992 Dependence on renal dialysis: Secondary | ICD-10-CM | POA: Diagnosis not present

## 2015-02-10 ENCOUNTER — Ambulatory Visit
Admit: 2015-02-10 | Disposition: A | Discharge status: Home / Self Care | Payer: Self-pay | Attending: Internal Medicine | Admitting: Internal Medicine

## 2015-02-10 DIAGNOSIS — D689 Coagulation defect, unspecified: Secondary | ICD-10-CM | POA: Diagnosis not present

## 2015-02-10 DIAGNOSIS — N186 End stage renal disease: Secondary | ICD-10-CM | POA: Diagnosis not present

## 2015-02-13 DIAGNOSIS — D689 Coagulation defect, unspecified: Secondary | ICD-10-CM | POA: Diagnosis not present

## 2015-02-13 DIAGNOSIS — N186 End stage renal disease: Secondary | ICD-10-CM | POA: Diagnosis not present

## 2015-02-14 DIAGNOSIS — N186 End stage renal disease: Secondary | ICD-10-CM | POA: Diagnosis not present

## 2015-02-14 DIAGNOSIS — T82898A Other specified complication of vascular prosthetic devices, implants and grafts, initial encounter: Secondary | ICD-10-CM | POA: Diagnosis not present

## 2015-02-14 DIAGNOSIS — Z992 Dependence on renal dialysis: Secondary | ICD-10-CM | POA: Diagnosis not present

## 2015-02-15 DIAGNOSIS — D689 Coagulation defect, unspecified: Secondary | ICD-10-CM | POA: Diagnosis not present

## 2015-02-15 DIAGNOSIS — N186 End stage renal disease: Secondary | ICD-10-CM | POA: Diagnosis not present

## 2015-02-17 DIAGNOSIS — D492 Neoplasm of unspecified behavior of bone, soft tissue, and skin: Secondary | ICD-10-CM | POA: Diagnosis not present

## 2015-02-17 DIAGNOSIS — L820 Inflamed seborrheic keratosis: Secondary | ICD-10-CM | POA: Diagnosis not present

## 2015-02-17 DIAGNOSIS — D689 Coagulation defect, unspecified: Secondary | ICD-10-CM | POA: Diagnosis not present

## 2015-02-17 DIAGNOSIS — N186 End stage renal disease: Secondary | ICD-10-CM | POA: Diagnosis not present

## 2015-02-20 DIAGNOSIS — N186 End stage renal disease: Secondary | ICD-10-CM | POA: Diagnosis not present

## 2015-02-20 DIAGNOSIS — D689 Coagulation defect, unspecified: Secondary | ICD-10-CM | POA: Diagnosis not present

## 2015-02-22 DIAGNOSIS — D689 Coagulation defect, unspecified: Secondary | ICD-10-CM | POA: Diagnosis not present

## 2015-02-22 DIAGNOSIS — N186 End stage renal disease: Secondary | ICD-10-CM | POA: Diagnosis not present

## 2015-02-24 DIAGNOSIS — D689 Coagulation defect, unspecified: Secondary | ICD-10-CM | POA: Diagnosis not present

## 2015-02-24 DIAGNOSIS — N186 End stage renal disease: Secondary | ICD-10-CM | POA: Diagnosis not present

## 2015-02-27 DIAGNOSIS — N186 End stage renal disease: Secondary | ICD-10-CM | POA: Diagnosis not present

## 2015-02-27 DIAGNOSIS — D689 Coagulation defect, unspecified: Secondary | ICD-10-CM | POA: Diagnosis not present

## 2015-02-28 DIAGNOSIS — M19012 Primary osteoarthritis, left shoulder: Secondary | ICD-10-CM | POA: Diagnosis not present

## 2015-02-28 DIAGNOSIS — C9000 Multiple myeloma not having achieved remission: Secondary | ICD-10-CM | POA: Diagnosis not present

## 2015-02-28 DIAGNOSIS — Z85828 Personal history of other malignant neoplasm of skin: Secondary | ICD-10-CM | POA: Diagnosis not present

## 2015-02-28 DIAGNOSIS — I252 Old myocardial infarction: Secondary | ICD-10-CM | POA: Diagnosis not present

## 2015-02-28 DIAGNOSIS — N186 End stage renal disease: Secondary | ICD-10-CM | POA: Diagnosis not present

## 2015-02-28 DIAGNOSIS — M5134 Other intervertebral disc degeneration, thoracic region: Secondary | ICD-10-CM | POA: Diagnosis not present

## 2015-02-28 DIAGNOSIS — D472 Monoclonal gammopathy: Secondary | ICD-10-CM | POA: Diagnosis not present

## 2015-02-28 DIAGNOSIS — M19011 Primary osteoarthritis, right shoulder: Secondary | ICD-10-CM | POA: Diagnosis not present

## 2015-02-28 DIAGNOSIS — K7689 Other specified diseases of liver: Secondary | ICD-10-CM | POA: Diagnosis not present

## 2015-02-28 DIAGNOSIS — E65 Localized adiposity: Secondary | ICD-10-CM | POA: Diagnosis not present

## 2015-02-28 DIAGNOSIS — E611 Iron deficiency: Secondary | ICD-10-CM | POA: Diagnosis not present

## 2015-02-28 DIAGNOSIS — Z96642 Presence of left artificial hip joint: Secondary | ICD-10-CM | POA: Diagnosis not present

## 2015-02-28 DIAGNOSIS — Z992 Dependence on renal dialysis: Secondary | ICD-10-CM | POA: Diagnosis not present

## 2015-02-28 DIAGNOSIS — R531 Weakness: Secondary | ICD-10-CM | POA: Diagnosis not present

## 2015-02-28 DIAGNOSIS — Z955 Presence of coronary angioplasty implant and graft: Secondary | ICD-10-CM | POA: Diagnosis not present

## 2015-02-28 DIAGNOSIS — Z79899 Other long term (current) drug therapy: Secondary | ICD-10-CM | POA: Diagnosis not present

## 2015-02-28 LAB — CBC CANCER CENTER
Basophil #: 0 x10 3/mm (ref 0.0–0.1)
Basophil %: 0.9 %
EOS ABS: 0.2 x10 3/mm (ref 0.0–0.7)
Eosinophil %: 5.5 %
HCT: 35.9 % — ABNORMAL LOW (ref 40.0–52.0)
HGB: 12 g/dL — ABNORMAL LOW (ref 13.0–18.0)
LYMPHS PCT: 40.1 %
Lymphocyte #: 1.7 x10 3/mm (ref 1.0–3.6)
MCH: 32.5 pg (ref 26.0–34.0)
MCHC: 33.5 g/dL (ref 32.0–36.0)
MCV: 97 fL (ref 80–100)
MONO ABS: 0.3 x10 3/mm (ref 0.2–1.0)
Monocyte %: 5.8 %
NEUTROS ABS: 2.1 x10 3/mm (ref 1.4–6.5)
Neutrophil %: 47.7 %
Platelet: 132 x10 3/mm — ABNORMAL LOW (ref 150–440)
RBC: 3.7 10*6/uL — ABNORMAL LOW (ref 4.40–5.90)
RDW: 13.7 % (ref 11.5–14.5)
WBC: 4.3 x10 3/mm (ref 3.8–10.6)

## 2015-02-28 NOTE — Op Note (Signed)
PATIENT NAME:  Frank Huang, Frank Huang MR#:  [id number] DATE OF BIRTH:  [birth year]  DATE OF PROCEDURE:  08/14/2012  PREOPERATIVE DIAGNOSIS: Stage V renal insufficiency.   POSTOPERATIVE DIAGNOSIS: Stage V renal insufficiency.  PROCEDURE PERFORMED: Creation of a left arm brachiocephalic fistula.   SURGEON: Katha Cabal, MD  ANESTHESIA: General by LMA.   FLUIDS: Per anesthesia record.   ESTIMATED BLOOD LOSS: Minimal.   SPECIMEN: None.   INDICATIONS: [name] is a 81 year old gentleman with stage V renal insufficiency. His creatinine clearance been as low as 13. He has been evaluated for access and is found to have an adequate left cephalic vein. Risks and benefits for creation of a brachiocephalic fistula were reviewed with the patient. All questions answered. Patient agrees to proceed.   DESCRIPTION OF PROCEDURE: Patient is taken to the Operating Room, placed in supine position. After adequate general anesthesia is induced and appropriate timeout has been called, he is positioned supine with his left arm extended. It is prepped and draped in a sterile fashion.   0.25% Marcaine is infiltrated in the soft tissues and a curvilinear incision is created across the antecubital fossa. The cephalic vein is dissected circumferentially ligating branches as they are encountered with 3-0 and 2-0 silk ties. A large antecubital crossing vein is encountered which appears to be bigger than the cephalic vein and therefore the cephalic vein is divided distal to the junction between the bridging vein and the true cephalic vein and the bridging vein will be used to anastomose to the artery.   Brachial artery is then exposed easily and looped proximally and distally.   The vein is then ligated and divided with a 2-0 silk tie after being marked with a surgical marker. Garrett coronary dilators are used and the vein sounds to 4 mm.   Vascular control is obtained with Silastic vessel loops. Arteriotomy is made with an  11 blade in the brachial artery. It is extended with Potts scissors. Stay suture of 6-0 Prolene is placed and an end vein to side brachial artery anastomosis is fashioned with running 6-0 Prolene suture. Flushing maneuvers are performed and flow is established through the cephalic vein. Excellent thrill is noted. The anastomosis is hemostatic. The wound is irrigated and then closed in layers using interrupted 3-0 Vicryl followed by 4-0 Monocryl subcuticular and Dermabond. Patient tolerated the procedure well. There were no complications. Sponge and needle counts are correct and he is taken to the recovery area in excellent condition.  ____________________________ Katha Cabal, MD ggs:cms D: 08/14/2012 10:14:07 ET T: 08/14/2012 10:21:52 ET JOB#: [id number]  cc: Katha Cabal, MD, <Dictator> Justin Mend, MD Katha Cabal MD ELECTRONICALLY SIGNED 08/17/2012 18:15

## 2015-03-01 DIAGNOSIS — D689 Coagulation defect, unspecified: Secondary | ICD-10-CM | POA: Diagnosis not present

## 2015-03-01 DIAGNOSIS — N186 End stage renal disease: Secondary | ICD-10-CM | POA: Diagnosis not present

## 2015-03-01 LAB — PROT IMMUNOELECTROPHORES(ARMC)

## 2015-03-01 LAB — KAPPA/LAMBDA FREE LIGHT CHAINS (ARMC)

## 2015-03-03 DIAGNOSIS — D689 Coagulation defect, unspecified: Secondary | ICD-10-CM | POA: Diagnosis not present

## 2015-03-03 DIAGNOSIS — N186 End stage renal disease: Secondary | ICD-10-CM | POA: Diagnosis not present

## 2015-03-03 NOTE — Consult Note (Signed)
PATIENT NAME:  Frank Huang, Frank Huang MR#:  [id number] DATE OF BIRTH:  Mar 23, 1932  DATE OF CONSULTATION:  06/17/2013  CONSULTING PHYSICIAN:  Corey Skains, MD  REQUESTING PHYSICIAN:  Dr. Posey Pronto  REASON FOR CONSULTATION: Known previous myocardial infarction, hypertension, hyperlipidemia, severe chronic kidney disease, COPD, with congestive heart failure.   CHIEF COMPLAINT: "I had pain."   HISTORY OF PRESENT ILLNESS:  This is a 81 year old male with known coronary artery disease, status post PCI and stent placement of the left anterior descending artery and occluded right coronary artery in the past, with old myocardial infarction and moderate LV systolic dysfunction. The patient has had severe chronic kidney disease, with a glomerular filtration rate under 30. The patient also has COPD, history of tobacco abuse, and has had new onset of left shoulder blade pain radiating into the left axilla. At this time, he has had some shortness of breath as well, with a chest x-ray consistent with pulmonary edema. The patient has had what appears to be acute on chronic congestive heart failure and unstable angina. The patient has had no evidence of elevation  consistent with myocardial infarction. EKG shows normal sinus rhythm with nonspecific ST and T-wave changes. The patient was given Lasix, with some improvement. He has had some resolution of his chest pain.  The remainder of review of systems negative for vision change, ringing in the ears, hearing loss, cough, congestion, heartburn, nausea, vomiting, diarrhea, bloody stools, stomach pain, extremity pain, leg weakness, cramping of the buttocks, known blood clots, headache, blackouts, dizzy spells, nosebleeds, congestion, trouble swallowing, frequent urination at night, muscle weakness, numbness, anxiety, depression,  skin rashes.   PAST MEDICAL HISTORY: 1.  Coronary artery disease. 2.  Old myocardial infarction. 3.  Hypertension.    4.  Chronic kidney disease. 5.   COPD.    FAMILY HISTORY: onset of cardiovascular disease or hypertension.  SOCIAL HISTORY:  He smokes 1/2 pack per day, and denies alcohol use.  ALLERGIES: As listed.   MEDICATIONS:  As listed.   PHYSICAL EXAMINATION: VITAL SIGNS:  Blood pressure is 110/76 bilaterally, heart rate is 76 upright, reclining and regular.  GENERAL: He is a well-appearing, elderly male in no acute distress.  HEAD, EYES, EARS, NOSE, THROAT: No icterus, thyromegaly, ulcers, hemorrhage or xanthelasma.  CARDIOVASCULAR:  Regular rate and rhythm. Normal S1 and S2, with a 2/6 apical murmur consistent with mitral regurgitation. PMI is diffuse. Carotid upstroke normal, without bruit. Jugular venous pressure is normal.  LUNGS:  Have bibasilar crackles with decreased breath sounds in the bases.  ABDOMEN: Soft, nontender, without hepatosplenomegaly or masses. Abdominal aorta is normal size, without bruit.  EXTREMITIES:  2+ radial, femoral, trace dorsal pedal pulses, with 1+ lower extremity edema. No cyanosis, clubbing, or ulcers.  NEUROLOGIC:  He is oriented to time, place and person, with normal mood and affect.   ASSESSMENT: A 81 year old male with coronary artery disease, previous myocardial infarction, hypertension, hyperlipidemia, severe chronic kidney disease, chronic obstructive pulmonary disease, with acute systolic dysfunction, congestive heart failure, without current evidence of myocardial infarction, and chest pain consistent with possible unstable angina.   RECOMMENDATIONS: 1.  Continue intravenous Lasix for further congestive heart failure and pulmonary edema.  2.  Further exacerbation of possible COPD, causing symptoms of hypoxia and shortness of breath and pneumonia of left axilla and left lower lobe.  3. Will follow very closely for chronic kidney disease, which appears to be exacerbated by intravenous Lasix.  4. Echocardiogram for LV systolic dysfunction, valvular heart  disease, worsening changes, has  been admitted with acute congestive heart failure.  5.  Hypertension, control with beta blocker, but would avoid ACE inhibitor if able due to severe chronic kidney disease.   6.  (Dictation Anomaly) enzymes to assess the extent of the possible myocardial infarction and further treatment options.   7. Ambulation, and follow for any improvements of symptoms and further adjustments of medications thereafter.      ____________________________ Corey Skains, MD bjk:mr D: 06/17/2013 19:16:00 ET T: 06/17/2013 20:42:27 ET JOB#: [id number]  cc: Corey Skains, MD, <Dictator> Corey Skains MD ELECTRONICALLY SIGNED 06/18/2013 14:08

## 2015-03-03 NOTE — Op Note (Signed)
PATIENT NAME:  Frank Huang, Frank Huang MR#:  [id number] DATE OF BIRTH:  Sep 03, 1932  DATE OF PROCEDURE:  11/03/2012  PREOPERATIVE DIAGNOSIS: Numbness and loss of motor function of left hand following creation of left brachiocephalic fistula.   POSTOPERATIVE DIAGNOSIS: Numbness and loss of motor function of left hand following creation of left brachiocephalic fistula.   PROCEDURES PERFORMED: 1. Arch aortogram.  2. Left upper extremity distal runoff, third order catheter placement.   SURGEON: Katha Cabal, M.D.   SEDATION: Versed 2 mg plus fentanyl 50 mcg administered IV. Continuous ECG, pulse oximetry and cardiopulmonary monitoring was performed throughout the entire procedure by the interventional radiology nurse. Total sedation time was 45 minutes.   ACCESS: Left common femoral artery, 5 French sheath.   FLUOROSCOPY TIME: 4.9 minutes.   CONTRAST USED: Isovue 40 mL.   INDICATIONS: Frank Huang is a 81 year old gentleman who underwent creation of a left brachiocephalic fistula and subsequently developed increasing numbness and loss of coordination of his hand. The risks and benefits for angiography with the hope of finding a correctable lesion and intervening were reviewed, all questions answered and the patient agrees to proceed.   DESCRIPTION OF PROCEDURE: The patient is taken to special procedures and placed in the supine position. After adequate sedation is achieved, both groins are prepped and draped in sterile fashion. Ultrasound is placed in a sterile sleeve. Ultrasound is utilized secondary to lack of appropriate landmarks and to avoid vascular injury. Under direct ultrasound visualization, common femoral artery on the right is identified. It appears to have diffuse and rather significant plaque formation, on the right. Therefore, the left groin is examined. This appears to have significantly less plaque. Femoral bifurcation is noted and the artery scanned slightly more proximally. 1% lidocaine is  infiltrated, image is recorded, artery is noted to be heterogeneous and pulsatile indicating patency and then the micropuncture needle is inserted into the anterior wall of the common femoral artery and, under direct ultrasound visualization, microwire followed by microsheath, J-wire followed by a 5 French sheath and 5 French pigtail catheter. Pigtail catheter is positioned in the ascending aorta and LAO projection of the arch is obtained. A stiff angled Glidewire and H1 catheter are then negotiated into the left subclavian and distal runoff is begun. After taking pictures from the axillary to the mid brachial, the wire is reintroduced and the catheter is advanced down just proximal to the fistula anastomosis and another picture is obtained. Although dye does flow past the fistula, it is significantly reduced and therefore the catheter is once again advanced distal to the fistula anastomosis and distal runoff is obtained. After review of the images, the catheter is removed, J-wire is reintroduced, LAO projection of the left groin is obtained and a Perclose device is deployed without difficulty. There are no immediate complications.   INTERPRETATION: Of arch, type I, there is no hemodynamically significant stenoses. The origins of the great vessels are widely patent. Subclavian, axillary and brachial arteries are widely patent as is the trifurcation in the arm, although the interosseous artery is quite small. Radial and ulnar arteries are patent to the hand filling the palmar arch, digital vessels appear intact. Anastomosis of the fistula is widely patent and there is significant reduction of flow past the fistula when dye is injected proximally. The fistula itself is widely patent, although there is one isolated area of tortuosity which is somewhat odd, but located more proximal than any access zone would be and; therefore, it should not impact his  potential dialysis. The actual first 3 cm of the fistula itself  are fairly small, appearing to be approximately 3 to 4 mm in diameter, again which should do a fairly good job of limiting flow and minimizing steal.   SUMMARY: There is no identified proximal lesion which would be significantly adding to the subclavian steal nor is there distal disease limiting flow to the hand.  ____________________________ Katha Cabal, MD ggs:sb D: 11/03/2012 09:07:15 ET     T: 11/03/2012 11:15:46 ET        JOB#: [id number] cc: Katha Cabal, MD, <Dictator> Katha Cabal MD ELECTRONICALLY SIGNED 11/17/2012 11:18

## 2015-03-03 NOTE — H&P (Signed)
PATIENT NAME:  Frank Huang, Frank Huang MR#:  [id number] DATE OF BIRTH:  02/20/1932  DATE OF ADMISSION:  06/17/2013  PRIMARY CARE PHYSICIAN: Domenick Gong.   REFERRING PHYSICIAN: Dr. Conni Slipper.   CHIEF COMPLAINT: Chest pain and shortness of breath.   HISTORY OF PRESENT ILLNESS: The patient is a 81 year old Caucasian male with a past medical history of coronary artery disease status post stent placement, chronic renal insufficiency stage III, systolic congestive heart failure, hypertension, hyperlipidemia and multiple other medical problems, including multiple myeloma on chemotherapy. He is presenting to the ER with chief complaint of a 1-day history of chest pain and shortness of breath. The patient is reporting that yesterday at around 10 a.m. in the morning, he started having left-sided chest pain radiating to the left shoulder blade and to the back, associated with shortness of breath. The patient did not seek any medical attention, and as eventually it was getting worse at 10 p.m. last night, he told his wife to bring him to the ER. By the time he came into the ER, he was short of breath and was having audible wheezing. The patient was given 2 to 3 breathing treatments, and chest x-ray has revealed pulmonary edema. The patient has received Solu-Medrol 125 mg IV and Lasix IV, and hospitalist team is called to admit the patient. First set of cardiac enzymes was negative. EKG revealed left axis deviation, nonspecific ST-T wave abnormality, undetermined rhythm. During my examination, the patient's chest pain has resolved. Shortness of breath has significantly improved. Wife is at bedside.   PAST MEDICAL HISTORY: Coronary artery disease status post MI, coronary artery stent placement in the year 2005, GERD, hyperlipidemia, hypertension, multiple myeloma on chemotherapy, hiatal hernia, diverticular disease  PAST SURGICAL HISTORY: Appendectomy, EGD, coronary artery stent placement, colonoscopy in the year 2005,  cholecystectomy.   ALLERGIES: The patient has no known drug allergies.   HOME MEDICATIONS: Plavix 75 mg once daily, omeprazole 20 mg once daily, metoprolol succinate 50 mg once daily.  PSYCHOSOCIAL HISTORY: Lives at home with wife. He used to smoke and he quit smoking 6 months ago. He started smoking at age 6 and smoked 1 pack a day. Denies alcohol or illicit drug usage.   FAMILY HISTORY: Sister had lung cancer. Another sister had stomach cancer. Another sister had myocardial infarction and bone cancer.   REVIEW OF SYSTEMS:   CONSTITUTIONAL: Denies any fever, fatigue. Denies any weight gain.  EYES: Denies blurry vision or glaucoma.  EARS, NOSE, THROAT: No epistaxis or discharge.  RESPIRATORY: Denies cough. Complaining of shortness of breath.  CARDIOVASCULAR: Complaining of chest pain. No palpitations or syncope.  GASTROINTESTINAL: No nausea, vomiting, diarrhea.  GENITOURINARY: No dysuria, hematuria or hernia.  ENDOCRINE: Denies polyuria, nocturia, thyroid problems.  HEMATOLOGIC AND LYMPHATIC: Denies anemia, easy bruising. Has multiple myeloma on chemotherapy.  INTEGUMENTARY: No acne. No rashes but has multiple actinic keratoses on the skin.  MUSCULOSKELETAL: No joint pain in the neck, back or gout.  NEUROLOGIC: Denies any vertigo, ataxia.  PSYCHIATRIC: No ADD, OCD, bipolar disorder.   PHYSICAL EXAMINATION:  VITAL SIGNS: Temperature 97.3, pulse 73, respirations 28, blood pressure 141/69, pulse ox 96%.  GENERAL APPEARANCE: Not in any acute distress. Moderately built and nourished.  HEENT: Normocephalic, atraumatic. Pupils are equally reacting to light and accommodation. No scleral icterus. No conjunctival injection. No sinus tenderness. No postnasal drip.  NECK: Supple. No JVD. No thyromegaly. Range of motion is intact.  LUNGS: Distant breath sounds. Diffuse wheezing is still present. Positive rales.  CARDIAC: S1 and S2 normal. Regular rate and rhythm. No murmurs.  GASTROINTESTINAL:  Soft. Bowel sounds are positive in all 4 quadrants. Nontender, nondistended. No hepatosplenomegaly.  NEUROLOGIC: Awake, alert, oriented x3. Cranial nerves II through XII are grossly intact. Motor and sensory are grossly intact. Reflexes are 2+.  EXTREMITIES: Trace edema. No cyanosis. No clubbing. Right forearm has AV fistula with good bruit.  SKIN: Multiple actinic keratoses are present. No rashes. Normal turgor.  MUSCULOSKELETAL: No joint effusion, tenderness, erythema.  PSYCHIATRIC: Normal mood and affect.   LABS AND IMAGING STUDIES: Chest x-ray: Pulmonary edema is present with cardiomegaly. CPK-MB 1.4, troponin less than 0.02. WBC 8.7,  hematocrit 36.0, platelets 143. Glucose 135, BNP 12,200, BUN 43, creatinine 3.59, sodium 141, potassium 4.9, chloride 111. GFR 15. Anion gap 4, Calcium 8.7.   ASSESSMENT AND PLAN: A 81 year old Caucasian male presenting with chest pain and shortness of breath with wheezing for 1 day. Will be admitted with the following assessment and plan:  1. Chest pain: Rule out acute coronary syndrome. Will admit him to telemetry. Cardiac biomarkers will be cycled. Will put him on acute coronary syndrome protocol with aspirin, beta blockers, statin. Nitroglycerine sublingual as needed.  2. Acute exacerbation of systolic congestive heart failure: Will give him Lasix 40 mg intravenous q.12 hours with close monitoring of the renal function. Continue beta blocker. Will obtain echocardiogram. Will hold off on ACE inhibitor in view of renal insufficiency.  3. Acute exacerbation of chronic obstructive pulmonary disease: Solu-Medrol 125 mg intravenous x1 was given in the Emergency Room. Will continue 60 mg intravenous q.6 hours. Provide nebulizer treatments. Intravenous levofloxacin will be given with close monitoring of the renal function.  4. History of multiple myeloma, on chemotherapy.   5. Chronic renal insufficiency stage III: As we are giving Lasix, will monitor renal function  closely and if necessary, will consider nephrology consult.  6. Will provide him gastrointestinal prophylaxis and deep vein thrombosis prophylaxis.   He is FULL CODE. Wife is his medical power of attorney.   The diagnosis and plan of care was discussed in detail with the patient and his wife at bedside. They both verbalized understanding of the plan.   TOTAL TIME SPENT ON ADMISSION: 50 minutes.   ____________________________ Nicholes Mango, MD ag:gb D: 06/17/2013 01:15:20 ET T: 06/17/2013 02:04:54 ET JOB#: [id number]  cc: Nicholes Mango, MD, <Dictator> Fonnie Jarvis. Ilene Qua, MD Nicholes Mango MD ELECTRONICALLY SIGNED 06/22/2013 6:14

## 2015-03-03 NOTE — Discharge Summary (Signed)
PATIENT NAME:  Frank Huang, Frank Huang MR#:  [id number] DATE OF BIRTH:  1932-08-16  DATE OF ADMISSION:  06/17/2013 DATE OF DISCHARGE:  06/20/2013  DISCHARGE DIAGNOSES:  1. Acute respiratory failure due to a combination of chronic obstructive pulmonary disease and congestive heart failure exacerbation.  2. Acute systolic congestive heart failure.  3. Acute on chronic renal failure.  4. Hyperkalemia.  5. Multiple myeloma.   CONDITION ON DISCHARGE: Stable.   CODE STATUS: FULL CODE.   MEDICATIONS ON DISCHARGE:  1. Lovastatin 40 mg oral tablet once a day.  2. Metoprolol 50 mg once a day.  3. Omeprazole 20 mg once a day.  4. Plavix 75 mg once a day.  5. Aspirin 81 mg once a day.  6. Advair Diskus 1 puff 2 times a day.  7. Tiotropium 18 mcg inhalation capsule once a day.   DIET ON DISCHARGE: Low sodium. Diet consistency regular.   ACTIVITY LIMITATION: As tolerated.   TIMEFRAME TO FOLLOWUP: Within 1 to 2 weeks in cardiology and nephrology clinics. Follow up with [facility], and advised to follow with pulmonologist for PFTs for COPD.   HISTORY OF PRESENT ILLNESS: A 81 year old Caucasian male with past medical history of coronary artery disease status post stent placement, chronic renal insufficiency stage III, systolic congestive heart failure, hypertension, hyperlipidemia and multiple other medical problems, including multiple myeloma on chemotherapy. Presented to the ER with chief complaint of 1-day history of chest pain and shortness of breath. This was left-sided chest pain, radiating to left shoulder and into the back, associated with shortness of breath. In the ER, chest x-ray showed pulmonary edema. The patient received Solu-Medrol 125 mg IV and Lasix IV and called in hospitalist team for admission.   HOSPITAL COURSE AND STAY:  1. Initially, the patient was admitted with acute respiratory failure secondary to possible CHF exacerbation and started on IV Lasix but had some  worsening in his renal function, so stopped Lasix and as the patient had wheezing, the main cause of his respiratory distress was thought to be COPD exacerbation. The patient was a chronic smoker for 70 years and was never diagnosed with COPD but was started on treatment for COPD with steroid and nebulizer treatment and felt better. Echocardiogram was done which showed ejection fraction 45% to 50%.  2. Chronic kidney disease. In acute exacerbation for that. Nephrology consult with Dr. Smith Mince from [facility] was called in and as per her, there was no need for hemodialysis at this time as electrolytes were stable and the patient was advised to follow in nephrology clinic.  3. Multiple myeloma: The patient was following with oncologist and advised to continue chemotherapy.   LABORATORY: On presentation, his BNP was 12,200, and his creatinine was 3.59. After getting Lasix, his creatinine went up to 4.5 and maximum to 4.67 but then gradually came down to 3.67. His potassium level went up to 5.9 but with treatment of hyperkalemia, it came down to 4.6 and remained stable. Troponin level was negative, less than 0.02. Hemoglobin was stable, 11.5 to 10.7. Echocardiogram showed ejection fraction 45% to 50%, normal left ventricular systolic function, moderately increased left ventricular internal cavity size, moderate mitral valve regurgitation, mild increase in left ventricular posterior wall thickness.   TOTAL TIME SPENT ON THIS DISCHARGE: 45 minutes.   ____________________________ Ceasar Lund Anselm Jungling, MD vgv:gb D: 06/24/2013 01:00:00 ET T: 06/24/2013 04:11:17 ET JOB#: [id number]  cc: Ceasar Lund. Anselm Jungling, MD, <Dictator> Herbon E. Raul Del, MD Corey Skains,  MD Fonnie Jarvis. Ilene Qua, MD Vaughan Basta MD ELECTRONICALLY SIGNED 06/29/2013 11:22

## 2015-03-03 NOTE — Op Note (Signed)
PATIENT NAME:  Frank Huang, Frank Huang MR#:  [id number] DATE OF BIRTH:  Jan 19, 1932  DATE OF PROCEDURE:  12/18/2012  PREOPERATIVE DIAGNOSES: 1. Steal syndrome, left hand, secondary to dialysis fistula.  2. Complication dialysis device.  3. Stage V renal insufficiency.   POSTOPERATIVE DIAGNOSES:  1. Steal syndrome, left hand, secondary to dialysis fistula.  2. Complication dialysis device.  3. Stage V renal insufficiency.   PROCEDURE PERFORMED: Distal revascularization with interval ligation; DRIL procedure, left arm.   SURGEON: Katha Cabal, M.D.   ANESTHESIA: General by LMA.   FLUIDS: Per anesthesia record.   ESTIMATED BLOOD LOSS: 100 mL.   SPECIMEN: None.   INDICATIONS: Mr. Huynh is a 81 year old gentleman who has developed weakness of his hand and some numbness and tingling. He has been evaluated by multiple specialties including neurology and this is felt to be an ischemic process. He is therefore undergoing DRIL procedure to prevent ligation of his fistula and preserve what is an excellent dialysis access. Risks and benefits are, all questions answered and the patient has agreed to proceed.   DESCRIPTION OF PROCEDURE: The patient is taken to the operating room and placed in the supine position. After adequate general anesthesia is induced and appropriate invasive monitors are placed he is positioned supine with his left arm extended palm upward. Left arm is prepped and draped in sterile fashion.   A linear incision is then created just below the level of the anastomosis, of the fistula, crossing the antecubital crease, and the dissection is carried down to the fascia which is opened and then the brachial artery is identified and looped proximally and distally. Branches are looped individually as well.   A linear incision is then made approximately 20 cm more proximally and is carried down through the soft tissues. Again, the fascia is opened and neurovascular vascular bundle is identified  and the brachial artery is looped proximally and distally. A tunnel was then created. CryoVein is opened on the back table and irrigated per the protocol. The CryoVein is a 4.5 to 6 mm vein.   The patient is then given 4000 units of heparin. The vein is pulled through the tunnel with the blue port proximal. The brachial artery is then controlled with Silastic vessel loops, arteriotomy made and extended with Potts scissors. The CryoVein is spatulated and end vein to side brachial artery anastomosis is fashioned with running 6-0 Prolene. Flushing maneuvers are performed and flow is established back to the fistula as well as to the hand. The graft is then pressurized. Anastomosis is checked and found to be hemostatic.  2-0 Ethibond is then used to doubly loop the distal brachial artery between the anastomosis of the fistula and the anastomosis of the bypass.   The distal brachial artery is then delivered into the operative field and hemostasis obtained with Silastic vessel loops. Arteriotomy is made, extended with Potts scissors. The vein is trimmed to the appropriate length, spatulated and an end vein to side distal brachial artery anastomosis is fashioned using running 6-0 Prolene. Flushing maneuvers are performed and flow is established to the hand. Several interrupted Prolene sutures are used to secure the anastomosis and Ethibond is then used to ligate the brachial artery between the fistula anastomosis and the new distal bypass anastomosis.   Both wounds are then inspected, irrigated copiously, and closed in layers using interrupted 3-0 Vicryl followed by 4-0 Monocryl subcuticular for the distal incision and 4-0 interrupted vertical mattress sutures for the proximal incision.  The patient tolerated the procedure well. There were no immediate complications, sponge and needle counts were correct and he is taken to the recovery area in excellent condition. ____________________________ Katha Cabal, MD ggs:sb D: 12/18/2012 10:33:04 ET T: 12/18/2012 11:12:22 ET JOB#: [id number]  cc: Katha Cabal, MD, <Dictator> Fonnie Jarvis. Ilene Qua, [facility] Katha Cabal MD ELECTRONICALLY SIGNED 12/28/2012 23:27

## 2015-03-04 NOTE — Consult Note (Signed)
PATIENT NAME:  Frank Huang, Frank Huang MR#:  [id number] DATE OF BIRTH:  01/09/1932  DATE OF CONSULTATION:  12/13/2013  REQUESTING PHYSICIAN:  Dr. Sanchez  CONSULTING PHYSICIAN:  Jalynn Waddell P. Cristela Stalder, MD  REASON FOR CONSULT:  Persistent fever.   HISTORY OF PRESENT ILLNESS: This is a very pleasant 81-year-old gentleman who has a history of myeloma, who recently started dialysis for end-stage renal disease. He was admitted January 30 with a fall due to weakness. He was found to have a hip fracture. Also, per his wife, he had a cough and sinus congestion for a few days prior. He had gone to his primary care doctor, where he was not noted to be febrile. He was diagnosed with probable bronchitis. He also had dialysis a few days prior to admission, and during that session a needle was stuck through the vein into the muscle, so he had to skip his followup dialysis session. Since admission, the patient has been treated with broad-spectrum antibiotics, and has resumed dialysis. However, he has had persistent fevers.   Currently the patient is sitting in bed surrounded by family, and is quite interactive. He is eating a full meal, without any distress. He denies any headaches, sore throat, dysphagia, abdominal pain, nausea, or diarrhea. He does have a mild cough. He continues to have some pain in his hip where the fracture is, but is otherwise without pain. He does have a skin lesion over his left elbow, where he scraped it with his fall.   PAST MEDICAL HISTORY: 1.  Myeloma.  2.  End-stage renal disease, begun dialysis several weeks ago.  3.  Liver cyst.  4.  Basal cell carcinoma of arm. 5.  Anemia of chronic disease.  6.  Coronary artery disease, status post stents.   SURGICAL HISTORY: Appendectomy, cholecystectomy, ventral hernia repair, cardiac stent placement, excision of basal cell carcinoma.   ALLERGIES: No known drug allergies.   SOCIAL HISTORY: Lives with his wife. He continues to smoke 2 to 3 cigarettes a  day. No alcohol.   FAMILY HISTORY: Significant for cancer in the family, with ovarian, stomach cancer and bone cancer.   REVIEW OF SYSTEMS:  Eleven systems reviewed and negative, except as per HPI.    ANTIBIOTICS SINCE ADMISSION INCLUDE: Zosyn, begun January 29 to present, Vancomycin renally dosed.   OTHER MEDICATIONS INCLUDE:  Tylenol, morphine, Zofran, pantoprazole, erythropoietin, Norco, Advair, multivitamin, Spiriva, and azithromycin, begun today.   PHYSICAL EXAMINATION: VITAL SIGNS: Huang-max last 24 hours is 101.3. The day prior, Huang-max was 1000.4, prior to that 102.5. On January 30 he was 102.5. Pulse is 103, blood pressure 116/69, sat 93% on 3 liters.  GENERAL: He is pleasant, interactive, sitting up in bed.  HEENT: Pupils equal, round, reactive to light and accommodation. Extraocular movements are intact. Sclerae anicteric. Oropharynx is clear.  NECK: Supple.  HEART: Regular, with a 1/6 systolic murmur.  LUNGS: Clear.  ABDOMEN: Soft, nontender, nondistended. No hepatosplenomegaly.  EXTREMITIES: He has 1+ bilateral lower extremity edema. His left upper extremity has a graft, with a good thrill. It is somewhat warm to the touch. His left elbow is wrapped.  NEUROLOGIC: He is alert and oriented x 3. Grossly nonfocal neuro exam.   DATA:  CT of the chest done February 2 showed small bilateral pleural effusions, minimal scattered peribronchial vascular nodularity, likely infectious or inflammatory. There is a suspected penetrating ulcerated plaque in the proximal descending thoracic aorta. CT of the humerus February 2 shows negative for abscess, but diffuse edema   of the left arm, which may represent subcu edema or cellulitis. Ultrasound of his arm on the left showed no focal fluid collection. There is generalized soft tissue edema. CT of his hip shows acute fracture of the femoral neck. Blood cultures January 30 negative x 2, on February 1 negative. Urine culture February 1 negative. Urinalysis  done on January 30 shows 5 white cells, followup yesterday shows 41 white cells. White blood count on admission was 8.6, currently it is 4.4, hemoglobin 8.4, platelets 118. Renal function was consistent with end-stage renal disease. Albumin is 2.4.   IMPRESSION: A 81-year-old gentleman with end-stage renal disease, multiple myeloma, coronary artery disease, admitted with a fall, but found also to have a fever. He has no leukocytosis. Blood cultures x 2 are negative. Urine culture is negative. Flu swab is negative. Imaging of his chest shows some evidence of small nodularity consistent with bronchitis. He has been on vancomycin and Zosyn, and spiking through those antibiotics.   I believe it is likely the left upper extremity cellulitis causing infection. This may be related to the prior needle access issue last week. Another possibility for the recurrent fever is drug fever, especially due to the Zosyn. Final option is atypical pneumonia, given the findings on CT of his chest.   RECOMMENDATIONS: 1.  Discontinue the Zosyn.  2.  Continue Vanco renal dosing.  3.  Agree with the azithromycin.  4.  Follow along and repeat cultures if he has a recurrent fever.   Thank you for the consult. I will be glad to follow with you.    ____________________________ Aviraj Kentner P. Leo Weyandt, MD dpf:mr D: 12/13/2013 16:46:39 ET Huang: 12/13/2013 18:51:48 ET JOB#: [id number]  cc: Vanna Shavers P. Carlei Huang, MD, <Dictator> Aidon Klemens MD ELECTRONICALLY SIGNED 12/13/2013 21:39 

## 2015-03-04 NOTE — Consult Note (Signed)
Admit Diagnosis:   FRACTURE OF HIP: Onset Date: 10-Dec-2013, Status: Active, Description: FRACTURE OF HIP    Dialysis:    gerd:    multiple myeloma:    htn:    gout:    mi:    Appendectomy:    Cholecystectomy:    Stent - Cardiac:   Home Medications: Medication Instructions Status  lovastatin 40 mg oral tablet 1 tab(s) orally once a day (in the morning) Active  omeprazole 20 mg oral delayed release tablet 1 tab(s) orally once a day Active  Plavix 75 mg oral tablet 1 tab(s) orally once a day Active  Uloric 40 mg oral tablet 1 tab(s) orally once a day as needed for gout. Active   Lab Results: Hepatic:  30-Jan-15 17:32   Bilirubin, Total 0.5  Bilirubin, Direct 0.10 (Result(s) reported on 10 Dec 2013 at 06:50PM.)  Alkaline Phosphatase 70 (45-117 NOTE: New Reference Range 10/01/13)  SGPT (ALT) 21  SGOT (AST)  40  Total Protein, Serum 7.2  Albumin, Serum  3.0  Routine BB:  30-Jan-15 17:32   ABO Group + Rh Type B Positive  Antibody Screen NEGATIVE (Result(s) reported on 10 Dec 2013 at 07:59PM.)  Routine Micro:  30-Jan-15 20:03   Micro Text Report INFLUENZA A+B ANTIGENS   COMMENT                   NEGATIVE FOR INFLUENZA A (ANTIGEN ABSENT)   COMMENT                   NEGATIVE FOR INFLUENZA B (ANTIGEN ABSENT)   ANTIBIOTIC                       Comment 1.. NEGATIVE FOR INFLUENZA A (ANTIGEN ABSENT) A negative result does not exclude influenza. Correlation with clinical impression is required.  Comment 2.. NEGATIVE FOR INFLUENZA B (ANTIGEN ABSENT)  Result(s) reported on 10 Dec 2013 at 08:58PM.  Routine Chem:  30-Jan-15 17:32   Lipase 357 (Result(s) reported on 10 Dec 2013 at 06:32PM.)  Magnesium, Serum  1.6 (1.8-2.4 THERAPEUTIC RANGE: 4-7 mg/dL TOXIC: > 10 mg/dL  -----------------------)  Result Comment TROPONIN - RESULTS VERIFIED BY REPEAT TESTING.  - C/T KELLY GIBSON @1854 12/10/13 BY [facility]  - READ-BACK PROCESS PERFORMED.  Result(s) reported on 10 Dec 2013  at 07:00PM.  Glucose, Serum  129  Creatinine (comp)  4.68  Sodium, Serum  133  Potassium, Serum 3.9  Chloride, Serum  95  CO2, Serum 28  Calcium (Total), Serum  8.4  Anion Gap 10  Osmolality (calc) 284  eGFR (African American)  13  eGFR (Non-African American)  11 (eGFR values <60mL/min/1.73 m2 may be an indication of chronic kidney disease (CKD). Calculated eGFR is useful in patients with stable renal function. The eGFR calculation will not be reliable in acutely ill patients when serum creatinine is changing rapidly. It is not useful in  patients on dialysis. The eGFR calculation may not be applicable to patients at the low and high extremes of body sizes, pregnant women, and vegetarians.)  Cardiac:  30-Jan-15 17:32   Troponin I  0.14 (0.00-0.05 0.05 ng/mL or less: NEGATIVE  Repeat testing in 3-6 hrs  if clinically indicated. >0.05 ng/mL: POTENTIAL  MYOCARDIAL INJURY. Repeat  testing in 3-6 hrs if  clinically indicated. NOTE: An increase or decrease  of 30% or more on serial  testing suggests a  clinically important change)  Routine Hem:  30-Jan-15 17:32     WBC (CBC) 8.6  RBC (CBC)  2.83  Hemoglobin (CBC)  9.0  Hematocrit (CBC)  26.8  Platelet Count (CBC)  135  MCV 95  MCH 31.7  MCHC 33.5  RDW  14.7  Neutrophil % 92.1  Lymphocyte % 3.2  Monocyte % 4.1  Eosinophil % 0.4  Basophil % 0.2  Neutrophil #  7.9  Lymphocyte #  0.3  Monocyte # 0.4  Eosinophil # 0.0  Basophil # 0.0 (Result(s) reported on 10 Dec 2013 at [facility].)   Radiology Results:  Radiology Results: XRay:    03-Nov-14 09:42, Bone Survey Metastatic ([place])  Bone Survey Metastatic ([place])  REASON FOR EXAM:    myeloma  COMMENTS:       PROCEDURE: MDR - MDR BONE SURVEY METASTATIC  - Sep 13 2013  9:42AM     RESULT: Metastatic bone survey 09/13/2013    Findings: The appendicular and axial skeleton demonstrate no evidence of   lytic nor blastic lesions. Multilevel degenerative changes are    appreciated within the spine. Frontal view the chest demonstrates no   acute abnormalities the cardiac silhouette is enlarged.    IMPRESSION:  No radiographic evidence of lytic nor blastic foci  2. Multilevel degenerative changes within the spine.  3. Comparison to prior dated 07/28/2012.    Verified By: Mikki Santee, M.D., MD    30-Jan-15 18:18, Hip Left Complete  Hip Left Complete  REASON FOR EXAM:    fall  COMMENTS:       PROCEDURE: DXR - DXR HIP LEFT COMPLETE  - Dec 10 2013  6:18PM     CLINICAL DATA:  [place] and injured left hip. Current history of  multiple myeloma.    EXAM:  LEFT HIP - COMPLETE 2+ VIEW    COMPARISON:  Images of the pelvis and the left femur on prior  metastatic bone survey 09/13/2013, 07/28/2012, 11/26/2011.    FINDINGS:  Acute subcapital left femoral neck fracture. No visible lytic lesion  in this region to suggest that this is related to myeloma. No lytic  lesions elsewhere involving the visualized proximal femora or  included AP pelvis. Well preserved joint spaces in both hips for  age. Sacroiliac joints and symphysis pubis intact.     IMPRESSION:  Acute subcapital left femoral neck fracture.      Electronically Signed    By: Evangeline Dakin M.D.    On: 12/10/2013 18:24       Verified By: Deniece Portela, M.D.,  [place]:    03-Nov-14 09:42, Bone Survey Metastatic ([place])  PACS Image    30-Jan-15 18:18, Hip Left Complete  PACS Image    30-Jan-15 20:33, CT Hip Left Without Contrast  PACS Image  CT:  CT Hip Left Without Contrast  REASON FOR EXAM:    fall w/ femoral neck fracture, further evaluate   extent of injury  COMMENTS:       PROCEDURE: CT  - CT HIP LEFT WITHOUT CONTRAST  - Dec 10 2013  8:33PM     CLINICAL DATA:  Left femoral neck fracture    EXAM:  CT OF THE LEFT HIP WITHOUT CONTRAST    TECHNIQUE:  Multidetector CT imaging was performed according to the standard  protocol. Multiplanar CT image reconstructions  were also generated.  COMPARISON:  Radiography same day    FINDINGS:  There is an acute fracture at the proximal femoral neck with with  ventral angulation and slight impaction. There are only 2 major  fragments.  No intertrochanteric component. No fracture of the other  pelvic bones. No evidence of underlying lesion.     IMPRESSION:  Acute fracture the femoral neck with ventral angulation and slight  impaction.      Electronically Signed    By: Nelson Chimes M.D.    On: 12/10/2013 20:35         Verified By: Jules Schick, M.D.,    No Known Allergies:   Nursing Flowsheets: **Vital Signs.:   30-Jan-15 20:47  Vital Signs Type Admission  Temperature Temperature (F) 102.5  Temperature Source oral  Pulse Pulse 102  Respirations Respirations 20  Systolic BP Systolic BP 811  Diastolic BP (mmHg) Diastolic BP (mmHg) 65  Mean BP 92  Pulse Ox % Pulse Ox % 95  Pulse Ox Activity Level  At rest  Oxygen Delivery 2L    General Aspect 81 y/o Caucasian male in no acute distress.   Present Illness 81 y/o male who fell at home after walking back from bathroom to bedroom. Noted immediate pain after fall and was unable to bear weight. Required family help to get up. Upon arrival in ED patient febrile, missed recent dialysis appointment.   Case History and Physical Exam:  Chief Complaint left hip pain, fever   Past Medical Health Coronary Artery Disease, Hypertension, ESRD on HD, h/o MI, Multiple myeloma, gout   Past Surgical History Cardiac Catheterization  Appendectomy  Cholecystectomy   Family History Non-Contributory   HEENT EOM intact, head atraumatic   Neck/Nodes Supple   Chest/Lungs No use of accessory muscles, no difficulty or shortness of breathing, regular rate, no wheezing   Breasts Not examined   Cardiovascular Normal Sinus Rhythm  good distal perfusion   Abdomen Benign   Genitalia Not examined   Rectal Not examined   Musculoskeletal LLE: SILT  DP/SP/sural/saphenous distribution. Able to move EHL/FHL/GCS/TA. Good distal perfusion. Pain with hip ROM. TTP groin area, extremity slightly shortened and ER.   Neurological Grossly WNL   Skin Warm  Dry  hemostatic skin tear at left elbow    Impression 81 y/o male with left subcapital hip fracture.   Plan Admit to hospitalist and medical optimization. Patient will need HD in AM and fever work-up, Will planfor left hemiarthroplasty when medically stabilized, please update orthopaedic team with status. Discontinue Plavix to decrease bleeding risk. Patient anemic with significant cardiac hsitory and on chronic anticoagulation which will put him at risk for blood transfusion postop. Please obtain type and cross. NWB to left lower extremity, pain control. Written consent obtained, will post when medically clear and afebrile.   Electronic Signatures: Harle Battiest (MD)  (Signed 30-Jan-15 22:01)  Authored: Health Issues, Significant Events - History, Home Medications, Labs, Radiology Results, Allergies, Vital Signs, General Aspect/Present Illness, History and Physical Exam, Impression/Plan   Last Updated: 30-Jan-15 22:01 by Harle Battiest (MD)

## 2015-03-04 NOTE — Discharge Summary (Signed)
PATIENT NAME:  Frank Huang, ROHLEDER MR#:  [id number] DATE OF BIRTH:  Feb 05, 1932  DATE OF ADMISSION:  12/10/2013 DATE OF DISCHARGE:  12/17/2013    ADMITTING DIAGNOSIS: Fall and left hip pain.   DISCHARGE DIAGNOSES: 1.  Fall and left hip pain due to a hip fracture, status post repair.  2.  Systemic inflammatory response syndrome, felt to be due to possible bronchitis as well as cellulitis of the fistula. No further fevers. Blood cultures negative.  3.  Acute respiratory failure with hypoxia due to pulmonary congestion due to missing hemodialysis as well as possible systolic congestive heart failure. The patient's respiratory status improved.  4.  Acute on chronic obstructive pulmonary disease exacerbation.  5.  Coronary artery disease.  6.  End-stage renal disease.  7.  Elevated troponin felt to be due to demand ischemia and multiple myeloma.  8.  Anemia, acute on chronic with acute blood loss requiring transfusion as a result of surgical blood loss.  9.  Multiple myeloma.   10.  Thrombocytopenia. 11.  Basal cell carcinoma of the left forearm.  12.  History of liver cyst.  13.  Status post appendectomy, cholecystectomy, and ventral hernia repair.  14.  Cardiac stent placement.  15.  Excision of basal cell carcinoma from the left forearm.   PERTINENT LABS AND EVALUATIONS: Admitting labs: WBC 8.6, hemoglobin 9.3, hematocrit 26.8. Platelet count was 135, sodium 133, potassium 3.9, chloride 95, bicarbonate 28, BUN 58, creatinine 4.68, glucose was 129, calcium was 8.4, ALT 21, AST 40, alkaline phosphatase 70, total bilirubin 0.5, albumin 3. Lipase 357, magnesium 1.6. Troponin 0.14. INR 1.1. Left hip x-ray showing acute subcapital left femoral neck fracture. Chest x-ray revealing cardiomegaly and central bronchiectatic changes. No acute findings noted. An EKG showing sinus rhythm with PVCs. No acute ST-T wave changes. Most recent hemoglobin today is 7.4. CULTURES:  Blood cultures no growth from January 15 and  February 1.  Urine cultures: No growth. CT scan of the chest with contrast on February 2 showed small bilateral pleural effusions with associated patchy, dependent opacities bilateral lobes, minimal scattered peribronchioloalveolar nodularities. Suspected penetrating atherosclerotic ulcer versus ulcerated plaque along the proximal descending thoracic aorta.   HOSPITAL COURSE: Please refer to H and P done by the admitting physician. The patient is a 81 year old white male with multiple medical problems who fell. Due to these symptoms, he was evaluated in the ED and was noted to have a left-sided hip fracture. Therefore we were asked to admit the patient. The patient was admitted and was seen by Cardiology for preoperative evaluation due to the patient having slightly elevated troponin. They stated that the patient was okay to go to surgery. He was also having low-grade fevers; therefore, ID consult was obtained. They thought that he had a combination of bronchitis and a possible cellulitis at his fistula site.  He was treated with IV vancomycin. The patient has done much better since then. He underwent the operation and is slowly improving. He underwent a left hip hemiarthroplasty on February 4. The patient is working with PT and is doing better. He was also noted to be febrile; therefore, an ID consult was obtained. They checked a CT scan of the chest, which showed some possible bronchitis. Also, the patient was noted to have some inflammation at the site of his fistula to he was treated with IV vancomycin. The patient has been afebrile and has not had any leukocytosis. At the time of discharge, his antibiotics will be discontinued.  He also had anemia but his hemoglobin did drop with the transfusion; therefore, the patient will be transfused today and likely be disposed to a skilled nursing facility. Discharge instructions given for CHF.   DISCHARGE MEDICATIONS: Lovastatin 40 daily, omeprazole 20 daily, Uloric  40 mg 1 tab p.o. daily, Spiriva 18 mcg daily, oxycodone 5 mg 1 to 2 tabs q. 4 p.r.n. for pain. Tramadol 50, one to 2 tabs q. 4 p.r.n. for pain, Tylenol 500 q. 4 p.r.n. for pain, magnesium hydroxide 30 mL b.i.d. as needed, aluminum hydroxide with magnesium hydroxide 30 mL q. 6 p.r.n. for indigestion and heartburn, heparin 5000 mg subcutaneous every 8 hours for the next 3 weeks, Megace 40 mg/mL, 10 mL b.i.d., albuterol ipratropium inhalation q. 6 p.r.n., Advair 250/50 one puff b.i.d., lidocaine prilocaine 1 application topically as needed at the graft or fistula cannulation, multivitamin daily, lactulose 30 mL b.i.d. p.r.n. for constipation, Colace.  Senna 1 tab p.o. b.i.d., Nephrocaps 2 tabs b.i.d.   HOME OXYGEN: Yes, 2 liters.   DIET: Low fat, low cholesterol renal diet.   ACTIVITY: As tolerated.   FOLLOW-UP:  With primary M.D. in 1 to 2 weeks. Follow up with Ortho as scheduled.   NOTE: Total time spent on this patient today 45 minutes.    ____________________________ Lafonda Mosses Posey Pronto, MD shp:dp D: 12/17/2013 14:42:48 ET T: 12/17/2013 15:07:55 ET JOB#: [id number]  cc: Darolyn Double H. Posey Pronto, MD, <Dictator> Alric Seton MD ELECTRONICALLY SIGNED 12/26/2013 13:06

## 2015-03-04 NOTE — Discharge Summary (Signed)
PATIENT NAME:  Frank Huang, VELTRE MR#:  [id number] DATE OF BIRTH:  [birth year]  DATE OF ADMISSION:  12/10/2013 DATE OF DISCHARGE:  12/19/2013  ADDENDUM   Final discharge summary done by Dr. Dustin Flock on the 6th of January. This is an addendum. Followed the patient on the 7th and 8th.    DISCHARGE DIAGNOSES:  1. Left hip fracture, status post surgery.  2. Systemic inflammatory response syndrome secondary to bronchitis and cellulitis of the arteriovenous fistula on the left. The patient had acute respiratory failure secondary to missed hemodialysis.  3. Chronic obstructive pulmonary disease exacerbation.  4. Coronary artery disease.   5. End-stage renal disease.  6. Stent placement.  7. The patient had a cardiac stent.  8. History of basal cell carcinoma.  9. Anemia, acute on chronic requiring multiple transfusions.  10. Multiple myeloma.   DISCHARGE MEDICATIONS: The patient's discharge medications are reviewed. The patient's discharge medications are the same, and we are going to add vancomycin because of his cellulitis of the left arm for 2 weeks with hemodialysis.   The patient was seen by Dr. Ola Spurr, and  he is started on vanco and  azithromycin for bronchitis. pt finished 5 days of azithromycin.The patient also received 1 unit of blood yesterday because of hemoglobin of 7.5. With his coronary artery disease, we did give him a unit of blood on the 8th of February. After the blood transfusion, hemoglobin did improve from 7.5 to 8.7.   The patient's vitals are stable today. Heart rate is around 96. Blood pressure 134/70. Sats around 93% on room air. Temperature 98.7.   He is going to [facility] today and discussed with the wife.   TIME SPENT: On discharge preparation, reviewing the old records and also old discharge summary and the medications took more than 30 minutes.    ____________________________ Epifanio Lesches, MD sk:gb D: 12/19/2013 12:08:39 ET T: 12/20/2013  03:19:10 ET JOB#: [id number]  cc: Epifanio Lesches, MD, <Dictator> Epifanio Lesches MD ELECTRONICALLY SIGNED 12/21/2013 16:00

## 2015-03-04 NOTE — H&P (Signed)
PATIENT NAME:  DERVIN, VORE MR#:  [id number] DATE OF BIRTH:  02-24-1932  DATE OF ADMISSION:  12/10/2013  ADMITTING PHYSICIAN: Samantha Crimes, M.D.  PRIMARY CARE PHYSICIAN: Laqueta Due, MD PRIMARY ONCOLOGIST: Dr. Inez Pilgrim.  PRIMARY NEPHROLOGIST: Dr. Johnney Ou from [facility] nephrology.   CHIEF COMPLAINT: Fall and left hip pain.   HISTORY OF PRESENT ILLNESS: Mr. Meek is a 81 year old Caucasian male with past medical history significant for multiple myeloma currently not on any chemotherapy; end-stage renal disease, just started on hemodialysis last week, last dialysis being Tuesday; anemia of chronic disease and iron deficiency anemia; coronary artery disease, status post stents in 2005; who presents to the hospital secondary to a fall today and complaining of left hip pain.   The patient is in pain from his left hip fracture and wife is at bedside who provides a very good history. According to the wife, the patient has been complaining of weakness for the last couple of days. He went to his nephrologist for dialysis on Tuesday, at which time he did not have a fever. However his appetite has been poor. He has been complaining of nausea.   Today he went to the bathroom and was coming back and his legs gave out, just fell down. He has been complaining of left hip pain since then, and x-ray of the left hip here in the ER shows acute subcapital left femoral neck fracture. However, of note, the patient is noted to have a fever of 101 degrees Fahrenheit here and he was hypoxic with sats in the 80s and tachycardic with heart rate as high as 104.  His chest x-ray does not reveal any pneumonia. The patient missed his hemodialysis yesterday  because on Tuesday when he went for dialysis, the needle went in deep into the muscle, slipping the vein, and he was asked to follow up tomorrow for dialysis.   The patient also has been having some dry cough and felt congested over the last couple of days, unable to cough out any  phlegm. He denies any dysuria, nausea, vomiting, or diarrhea at this time. He has not been exposed to anybody who was sick. He does complain of some chills. He denies any chest pain or dyspnea on exertion. Prior to the fall he was ambulating without any walker or a cane and has not complained of any cardiac symptoms.   PAST MEDICAL HISTORY:  1.  Multiple myeloma, currently in remission, not on any treatment.  2.  End-stage renal failure, just started on Tuesday/Thursday/Saturday hemodialysis last week.  3.  Liver cyst.  4.  Basal cell carcinoma of left forearm.  5.  Anemia of chronic disease and iron deficiency anemia.  6.  Coronary artery disease, status post stents in 2005.   PAST SURGICAL HISTORY:  1.  Appendectomy.  2.  Cholecystectomy.  3.  Ventral hernia repair.  4.  Cardiac stent placement.  5.  Excision of basal cell carcinoma from left forearm.   ALLERGIES TO MEDICATIONS: No known drug allergies.   CURRENT HOME MEDICATIONS:  1.  Lovastatin 40 mg p.o. daily.  2.  Omeprazole 20 mg p.o. daily.  3.  Plavix 75 mg a daily.  4.  Uloric 40 mg as needed for gout.  5.  The patient was also on Toprol 50 mg daily. That was stopped due to recurrent hypertension recently since being started on dialysis.   SOCIAL HISTORY: Lives at home with his wife. Was ambulatory without any cane or walker prior to the  fall. Continues to smoke about 2 to 3 cigarettes per day and no alcohol use.   FAMILY HISTORY: Significant history of cancer in the family, ovarian cancer, stomach cancer, and bone cancer in sisters and dad also had renal failure and heart disease and COPD.   REVIEW OF SYSTEMS:  CONSTITUTIONAL: Positive for fever and chills. No fatigue or weakness.  EYES: No blurry vision, double vision, inflammation, or glaucoma. Positive history of cataracts.  ENT: No tinnitus, ear pain, hearing loss, epistaxis or discharge.  RESPIRATORY: Positive for cough. No wheeze, hemoptysis or COPD.   CARDIOVASCULAR: No chest pain, orthopnea, edema, arrhythmia, palpitations, or syncope.  GASTROINTESTINAL: Positive for nausea. No vomiting, diarrhea, abdominal pain, hematemesis or melena.  GENITOURINARY: The patient still makes urine. No frequency of urine or hematuria is described. ENDOCRINE: No polyuria, nocturia, thyroid problems, heat or cold intolerance.  MUSCULOSKELETAL: Complains of left hip pain at this time. No back pain, radiculopathic  symptoms.  NEUROLOGIC: No numbness, weakness, CVA, TIA or seizures.  PSYCHOLOGICAL: No anxiety, insomnia or depression.   PHYSICAL EXAMINATION:  VITAL SIGNS: Temperature 101.8 degrees Fahrenheit, pulse 104, respirations 22, blood pressure 147/67, pulse of 82% on room air.  GENERAL: Well-built, well-nourished male lying in bed in mild respiratory distress.  HEENT: Normocephalic, atraumatic. Pupils equal, round, reacting to light. Anicteric sclerae. Extraocular movements intact.  OROPHARYNX: Clear without erythema, mass or exudates.  NECK: Supple. No thyromegaly, JVD or carotid bruits. No lymphadenopathy.  LUNGS: Moving air bilaterally. Fine expiratory wheezes heard at the bases of lungs with some crackles. No use of accessory muscles for breathing but does have a congestive cough.  CARDIOVASCULAR: S1, S2, regular rate and rhythm. A 3/6 systolic murmur heard. No rubs or gallops.  ABDOMEN: Obese, soft, nontender, nondistended. No hepatosplenomegaly. Normal bowel sounds.  EXTREMITIES: Trace pedal edema. No clubbing or cyanosis with 2+ dorsalis pedis pulses palpable bilaterally. His left leg is extended and externally rotated and abducted. There is no ecchymosis or bruising noted around hip area on the left side. He does have AV fistula, left arm, which has a good bruit heard.  SKIN: No acne, rash or lesions.  LYMPHATICS: No cervical lymphadenopathy.  NEUROLOGIC: Cranial nerves intact. No focal motor or sensory deficits other than left hip is immobilized  from the fracture.  PSYCHIATRIC: Awake, alert, oriented x3.   LABORATORY DATA: WBC 8.6, hemoglobin 9.3, hematocrit 26.8, platelet count 135.   Sodium 133, potassium 3.9, chloride 95, bicarb 28, BUN 58, creatinine 4.68, glucose 129, calcium of 8.4.   ALT 21, AST 40, alk phos 70, total bilirubin 0.5, and albumin of 3.0. Lipase 357. Magnesium is 1.6 and troponin 0.14. INR 1.1, PTT 28.5.   Left hip x-ray showing acute subcapital left femoral neck fracture.   Chest x-ray revealing cardiomegaly and central bronchiectatic changes. No acute findings noted.   EKG showing sinus rhythm with some PVCs. No acute ST-T-wave abnormality. Heart rate of 95.   ASSESSMENT AND PLAN: This 81 year old male with history of multiple myeloma, coronary artery disease status post stents, chronic anemia, end-stage renal disease, started on hemodialysis just last week was admitted for a fall and hip fracture. Noted to be febrile and hypoxic in the 80s.   1.  Preoperative evaluation for left hip fracture. The patient at this time has fever, hypoxia and being due for dialysis with elevated troponins, is high risk for surgery so would not recommend to proceed with any surgery at this time. Discussed this with the orthopedic surgeon. Blood cultures  need to be negative to avoid infection of the new prosthesis. Orthopedics has been consulted so at this time will focus on pain management, maybe traction, so will start on some liquid diet for now. Anticipating no surgery tomorrow.  2.  Systemic inflammatory response syndrome. Fever and tachycardia with chest x-ray showing some bronchitic changes. Urinalysis is pending; however, with the new dialysis being started, bacteremia needs to be ruled out though the site of the fistula does not look infected, tender, or erythematous. Blood cultures have been ordered, started on empiric vancomycin and Zosyn. Also influenza test ordered at this time.  3.  End-stage renal disease on hemodialysis.  Missed hemodialysis yesterday due to false  technique on Tuesday. Will need dialysis tomorrow. Nephrology has been consulted.  4.  Acute respiratory failure with hypoxia after missing hemodialysis. Could be pulmonary congestion versus bronchitis seen. He is on antibiotics anyway and will need dialysis tomorrow.  5.  Elevated troponin. Could be demand ischemia with his fever and hypoxia rather than non-ST-segment elevation myocardial infarction. The patient denies any chest pain or dyspnea and has been actively ambulating prior to the fall today. Plavix held in need for surgery. Toprol stopped as an outpatient secondary to hypotension. Cardiology has been consulted. Will recycle troponins and anyway surgery is not tomorrow.  6.  Multiple myeloma. Follows with Dr. Inez Pilgrim. Stable.  7.  Gastroesophageal reflux disease. Continue Protonix.  8.  Anemia of chronic disease is stable for now and will continue to follow after surgery.   CODE STATUS: FULL CODE.   TIME SPENT ON ADMISSION: 50 minutes.   ____________________________ Gladstone Lighter, MD rk:np D: 12/10/2013 20:10:31 ET T: 12/10/2013 21:22:48 ET JOB#: [id number]  cc: Gladstone Lighter, MD, <Dictator> Gladstone Lighter MD ELECTRONICALLY SIGNED 12/16/2013 15:11

## 2015-03-04 NOTE — Op Note (Signed)
PATIENT NAME:  Frank Huang, Frank Huang MR#:  [id number] DATE OF BIRTH:  1932-03-20  DATE OF PROCEDURE:  12/15/2013  PREOPERATIVE DIAGNOSIS: Displaced left femoral neck fracture.   POSTOPERATIVE DIAGNOSIS: Displaced left femoral neck fracture.    PROCEDURE PERFORMED: Left hip hemiarthroplasty.   SURGEON: Laurice Record. Holley Bouche., M.D.   ASSISTANT: Vance Peper, PA (required to maintain retraction throughout the procedure).   ANESTHESIA: Spinal.   ESTIMATED BLOOD LOSS: 200 mL.   FLUIDS REPLACED: 1000 mL of crystalloid.   DRAINS: Two medium drains to Hemovac reservoir.   IMPLANTS UTILIZED: DePuy size 6 Summit femoral stem, 56 mm outer diameter Cathcart hip ball, +0 mm tapered spacer, an 11 mm Cementralizer, and a size 3 cement restrictor. Gentamicin cement was utilized due to the patient's previous history of fever of unknown origin.   INDICATIONS FOR SURGERY: The patient is a 81 year old male who fell and sustained a displaced left femoral neck fracture. Surgery was delayed due to workup for fever of unknown origin. The patient had subsequently remained afebrile for the preceding 24 hours and was deemed to be stable for surgical intervention. Risks and benefits of surgical intervention were discussed in detail the patient and his family. They expressed understanding of the risks and benefits and agreed with plans for surgical intervention.   PROCEDURE IN DETAIL: The patient was brought into the operating room and, after adequate spinal anesthesia was achieved, the patient was placed in a right lateral decubitus position. Axillary roll was placed, and all bony prominences were well padded. The patient's left hip and leg were cleaned and prepped with alcohol and DuraPrep and draped in the usual sterile fashion. A "timeout" was performed as per usual protocol. A lateral curvilinear incision was made gently curving towards the posterior-superior iliac spine. IT band was incised in line with the skin incision. Fibers  of the gluteus maximus were split in line. Piriformis tendon was identified, skeletonized, incised at its insertion at the proximal femur and reflected posteriorly. In a similar fashion, short external rotators were incised and reflected posteriorly. A T-type posterior capsulotomy was performed. A moderate hemarthrosis was evacuated. The femoral head was removed using a corkscrew device and measured using calipers. It was felt that a 56 mm diameter was appropriate. Bony fragments from the fracture site were removed. The acetabulum was inspected and felt to be in excellent condition. The femoral neck cut was performed using an oscillating saw. Pilot hole was created and a canal finder subsequently advanced. The entry site was opened using a conical reamer. Serial broaches were inserted up to a size 6 broach. The calcar region was planed, and trial reduction was performed with a 56 mm hip ball with a +0 mm neck length. Excellent stability was appreciated both anteriorly and posteriorly. Good equalization of limb lengths was appreciated. Trial components were removed. The femoral canal was sized. It was felt that a size 3 cement restrictor was appropriate. This was inserted to the appropriate depth in the canal. Proximal femoral canal was then irrigated with copious amounts of normal saline with antibiotic solution using pulsatile lavage and then suctioned dry. The canal was then packed with vaginal packing soaked in dilute Neo-Synephrine. Polymethylmethacrylate cement with gentamicin was prepared in the usual fashion using a vacuum mixer. Vaginal packing was removed and the canal again irrigated and suctioned dry. The cement was introduced in a retrograde fashion and then pressurized. A size 6 Summit femoral stem with an 11 mm distal Cementralizer was positioned and inserted. Excess  cement was removed using [employer] fast streamer. After adequate curing of the cement, trial reduction was again performed with a 56 mm hip ball  with a +0 neck length. Again, this allowed for excellent stability both anteriorly and posteriorly and good equalization of limb lengths. Trial components were removed. The Morse taper was cleaned and dried. A 56 mm outer diameter Cathcart hip ball with a +0 mm tapered spacer was placed on the trunnion and impacted into place. The acetabulum was again irrigated and suctioned dry and inspected for any debris. The hip was reduced and placed through range of motion. Again, excellent stability and equalization of limb lengths was appreciated. The wound was irrigated with copious amounts of normal saline with antibiotic solution using pulsatile lavage and then suctioned dry. Good hemostasis was appreciated. The posterior capsulotomy was repaired using #5 Ethibond. The piriformis tendon was reapproximated on the undersurface of the gluteus medius tendon using #5 Ethibond. Two medium drains were placed in the wound bed and brought out through separate stab incisions to be attached to a Hemovac reservoir. IT band was repaired using interrupted sutures of #1 Vicryl. The subcutaneous tissue was approximated in layers using first #0 Vicryl, followed by #2-0 Vicryl. The skin was closed with skin staples. A sterile dressing was applied.   The patient tolerated the procedure well. He was transported to the recovery room in stable condition.    ____________________________ Laurice Record. Holley Bouche., MD jph:gb D: 12/16/2013 00:27:45 ET T: 12/16/2013 03:15:42 ET JOB#: [id number]  cc: Jeneen Rinks P. Holley Bouche., MD, <Dictator> JAMES P Holley Bouche MD ELECTRONICALLY SIGNED 12/31/2013 6:42

## 2015-03-04 NOTE — Consult Note (Signed)
PATIENT NAME:  Frank Huang, Frank Huang MR#:  [id number] DATE OF BIRTH:  08/08/1932  DATE OF CONSULTATION:  12/13/2013  REQUESTING PHYSICIAN:  Dr. Laurin Coder  CONSULTING PHYSICIAN:  Cheral Marker. Ola Spurr, MD  REASON FOR CONSULT:  Persistent fever.   HISTORY OF PRESENT ILLNESS: This is a very pleasant 81 year old gentleman who has a history of myeloma, who recently started dialysis for end-stage renal disease. He was admitted January 30 with a fall due to weakness. He was found to have a hip fracture. Also, per his wife, he had a cough and sinus congestion for a few days prior. He had gone to his primary care doctor, where he was not noted to be febrile. He was diagnosed with probable bronchitis. He also had dialysis a few days prior to admission, and during that session a needle was stuck through the vein into the muscle, so he had to skip his followup dialysis session. Since admission, the patient has been treated with broad-spectrum antibiotics, and has resumed dialysis. However, he has had persistent fevers.   Currently the patient is sitting in bed surrounded by family, and is quite interactive. He is eating a full meal, without any distress. He denies any headaches, sore throat, dysphagia, abdominal pain, nausea, or diarrhea. He does have a mild cough. He continues to have some pain in his hip where the fracture is, but is otherwise without pain. He does have a skin lesion over his left elbow, where he scraped it with his fall.   PAST MEDICAL HISTORY: 1.  Myeloma.  2.  End-stage renal disease, begun dialysis several weeks ago.  3.  Liver cyst.  4.  Basal cell carcinoma of arm. 5.  Anemia of chronic disease.  6.  Coronary artery disease, status post stents.   SURGICAL HISTORY: Appendectomy, cholecystectomy, ventral hernia repair, cardiac stent placement, excision of basal cell carcinoma.   ALLERGIES: No known drug allergies.   SOCIAL HISTORY: Lives with his wife. He continues to smoke 2 to 3 cigarettes a  day. No alcohol.   FAMILY HISTORY: Significant for cancer in the family, with ovarian, stomach cancer and bone cancer.   REVIEW OF SYSTEMS:  Eleven systems reviewed and negative, except as per HPI.    ANTIBIOTICS SINCE ADMISSION INCLUDE: Zosyn, begun January 29 to present, Vancomycin renally dosed.   OTHER MEDICATIONS INCLUDE:  Tylenol, morphine, Zofran, pantoprazole, erythropoietin, Norco, Advair, multivitamin, Spiriva, and azithromycin, begun today.   PHYSICAL EXAMINATION: VITAL SIGNS: T-max last 24 hours is 101.3. The day prior, T-max was 1000.4, prior to that 102.5. On January 30 he was 102.5. Pulse is 103, blood pressure 116/69, sat 93% on 3 liters.  GENERAL: He is pleasant, interactive, sitting up in bed.  HEENT: Pupils equal, round, reactive to light and accommodation. Extraocular movements are intact. Sclerae anicteric. Oropharynx is clear.  NECK: Supple.  HEART: Regular, with a 1/6 systolic murmur.  LUNGS: Clear.  ABDOMEN: Soft, nontender, nondistended. No hepatosplenomegaly.  EXTREMITIES: He has 1+ bilateral lower extremity edema. His left upper extremity has a graft, with a good thrill. It is somewhat warm to the touch. His left elbow is wrapped.  NEUROLOGIC: He is alert and oriented x 3. Grossly nonfocal neuro exam.   DATA:  CT of the chest done February 2 showed small bilateral pleural effusions, minimal scattered peribronchial vascular nodularity, likely infectious or inflammatory. There is a suspected penetrating ulcerated plaque in the proximal descending thoracic aorta. CT of the humerus February 2 shows negative for abscess, but diffuse edema  of the left arm, which may represent subcu edema or cellulitis. Ultrasound of his arm on the left showed no focal fluid collection. There is generalized soft tissue edema. CT of his hip shows acute fracture of the femoral neck. Blood cultures January 30 negative x 2, on February 1 negative. Urine culture February 1 negative. Urinalysis  done on January 30 shows 5 white cells, followup yesterday shows 41 white cells. White blood count on admission was 8.6, currently it is 4.4, hemoglobin 8.4, platelets 118. Renal function was consistent with end-stage renal disease. Albumin is 2.4.   IMPRESSION: A 81 year old gentleman with end-stage renal disease, multiple myeloma, coronary artery disease, admitted with a fall, but found also to have a fever. He has no leukocytosis. Blood cultures x 2 are negative. Urine culture is negative. Flu swab is negative. Imaging of his chest shows some evidence of small nodularity consistent with bronchitis. He has been on vancomycin and Zosyn, and spiking through those antibiotics.   I believe it is likely the left upper extremity cellulitis causing infection. This may be related to the prior needle access issue last week. Another possibility for the recurrent fever is drug fever, especially due to the Zosyn. Final option is atypical pneumonia, given the findings on CT of his chest.   RECOMMENDATIONS: 1.  Discontinue the Zosyn.  2.  Continue Vanco renal dosing.  3.  Agree with the azithromycin.  4.  Follow along and repeat cultures if he has a recurrent fever.   Thank you for the consult. I will be glad to follow with you.    ____________________________ Cheral Marker. Ola Spurr, MD dpf:mr D: 12/13/2013 16:46:39 ET T: 12/13/2013 18:51:48 ET JOB#: [id number]  cc: Cheral Marker. Ola Spurr, MD, <Dictator> Sarinity Dicicco Ola Spurr MD ELECTRONICALLY SIGNED 12/13/2013 21:39

## 2015-03-04 NOTE — Consult Note (Signed)
Brief Consult Note: Diagnosis: pt with esrd on hd, history od cad, admitted after suffering a mechanical fall fracturing his hip. Note to have fever and mild elevaated serum troponin.   Patient was seen by consultant.   Recommend to proceed with surgery or procedure.   Comments: 81 yo with esrd admitted with femoral neck fracture after a mechanical fall. Mildly elevated troponin does not appear to represent an acute coronary event. Is most likely secondary to increasd demand with fever and fall in face of ranl failure. No chest pain or shortness of breath . Would proceed with surgery as needed with routine cardiac monitoring. Pt is at moderate risk  due to his underlying comorbid conditions but does not need further cardiac workup at present.  Electronic Signatures: Teodoro Spray (MD)  (Signed 31-Jan-15 13:09)  Authored: Brief Consult Note   Last Updated: 31-Jan-15 13:09 by Teodoro Spray (MD)

## 2015-03-04 NOTE — Discharge Summary (Signed)
PATIENT NAME:  Frank Huang, Frank Huang MR#:  [id number] DATE OF BIRTH:  02-09-1932  DATE OF ADMISSION:  12/10/2013 DATE OF DISCHARGE:  12/19/2013  ADDENDUM   The patient already finished 5 days of azithromycin, so we are not going to give him any azithromycin prescription. He was started on Megace by Dr. Dustin Flock to stimulate his appetite. He is on Megace 400 mg p.o. b.i.d.   TIME SPENT: Less than 30 minutes this time.   ____________________________ Epifanio Lesches, MD sk:gb D: 12/19/2013 12:14:27 ET T: 12/20/2013 03:35:41 ET JOB#: [id number]  cc: Epifanio Lesches, MD, <Dictator> Epifanio Lesches MD ELECTRONICALLY SIGNED 12/21/2013 16:00

## 2015-03-06 DIAGNOSIS — D689 Coagulation defect, unspecified: Secondary | ICD-10-CM | POA: Diagnosis not present

## 2015-03-06 DIAGNOSIS — N186 End stage renal disease: Secondary | ICD-10-CM | POA: Diagnosis not present

## 2015-03-08 DIAGNOSIS — N186 End stage renal disease: Secondary | ICD-10-CM | POA: Diagnosis not present

## 2015-03-08 DIAGNOSIS — D689 Coagulation defect, unspecified: Secondary | ICD-10-CM | POA: Diagnosis not present

## 2015-03-10 DIAGNOSIS — N186 End stage renal disease: Secondary | ICD-10-CM | POA: Diagnosis not present

## 2015-03-10 DIAGNOSIS — D689 Coagulation defect, unspecified: Secondary | ICD-10-CM | POA: Diagnosis not present

## 2015-03-11 DIAGNOSIS — N186 End stage renal disease: Secondary | ICD-10-CM | POA: Diagnosis not present

## 2015-03-11 DIAGNOSIS — Z992 Dependence on renal dialysis: Secondary | ICD-10-CM | POA: Diagnosis not present

## 2015-03-11 DIAGNOSIS — I120 Hypertensive chronic kidney disease with stage 5 chronic kidney disease or end stage renal disease: Secondary | ICD-10-CM | POA: Diagnosis not present

## 2015-03-13 DIAGNOSIS — D689 Coagulation defect, unspecified: Secondary | ICD-10-CM | POA: Diagnosis not present

## 2015-03-13 DIAGNOSIS — N186 End stage renal disease: Secondary | ICD-10-CM | POA: Diagnosis not present

## 2015-03-15 DIAGNOSIS — N186 End stage renal disease: Secondary | ICD-10-CM | POA: Diagnosis not present

## 2015-03-15 DIAGNOSIS — D689 Coagulation defect, unspecified: Secondary | ICD-10-CM | POA: Diagnosis not present

## 2015-03-17 DIAGNOSIS — D689 Coagulation defect, unspecified: Secondary | ICD-10-CM | POA: Diagnosis not present

## 2015-03-17 DIAGNOSIS — N186 End stage renal disease: Secondary | ICD-10-CM | POA: Diagnosis not present

## 2015-03-20 ENCOUNTER — Other Ambulatory Visit: Payer: Self-pay | Admitting: *Deleted

## 2015-03-20 DIAGNOSIS — N186 End stage renal disease: Secondary | ICD-10-CM | POA: Diagnosis not present

## 2015-03-20 DIAGNOSIS — C9000 Multiple myeloma not having achieved remission: Secondary | ICD-10-CM

## 2015-03-20 DIAGNOSIS — D689 Coagulation defect, unspecified: Secondary | ICD-10-CM | POA: Diagnosis not present

## 2015-03-21 ENCOUNTER — Inpatient Hospital Stay: Payer: Medicare Other | Attending: Internal Medicine

## 2015-03-21 DIAGNOSIS — C9000 Multiple myeloma not having achieved remission: Secondary | ICD-10-CM | POA: Diagnosis not present

## 2015-03-21 LAB — CBC WITH DIFFERENTIAL/PLATELET
BASOS ABS: 0 10*3/uL (ref 0–0.1)
BASOS PCT: 1 %
Eosinophils Absolute: 0.2 10*3/uL (ref 0–0.7)
Eosinophils Relative: 6 %
HCT: 34.9 % — ABNORMAL LOW (ref 40.0–52.0)
Hemoglobin: 11.8 g/dL — ABNORMAL LOW (ref 13.0–18.0)
Lymphocytes Relative: 38 %
Lymphs Abs: 1.6 10*3/uL (ref 1.0–3.6)
MCH: 32.5 pg (ref 26.0–34.0)
MCHC: 33.9 g/dL (ref 32.0–36.0)
MCV: 95.9 fL (ref 80.0–100.0)
Monocytes Absolute: 0.2 10*3/uL (ref 0.2–1.0)
Monocytes Relative: 5 %
Neutro Abs: 2.1 10*3/uL (ref 1.4–6.5)
Neutrophils Relative %: 50 %
Platelets: 126 10*3/uL — ABNORMAL LOW (ref 150–440)
RBC: 3.64 MIL/uL — ABNORMAL LOW (ref 4.40–5.90)
RDW: 13.5 % (ref 11.5–14.5)
WBC: 4.2 10*3/uL (ref 3.8–10.6)

## 2015-03-22 DIAGNOSIS — N186 End stage renal disease: Secondary | ICD-10-CM | POA: Diagnosis not present

## 2015-03-22 DIAGNOSIS — D689 Coagulation defect, unspecified: Secondary | ICD-10-CM | POA: Diagnosis not present

## 2015-03-22 LAB — KAPPA/LAMBDA LIGHT CHAINS
Kappa free light chain: 1000.14 mg/L — ABNORMAL HIGH (ref 3.30–19.40)
Kappa, lambda light chain ratio: 70.88 — ABNORMAL HIGH (ref 0.26–1.65)
Lambda free light chains: 14.11 mg/L (ref 5.71–26.30)

## 2015-03-23 LAB — IMMUNOFIXATION ELECTROPHORESIS
IGA: 1413 mg/dL — AB (ref 61–437)
IGG (IMMUNOGLOBIN G), SERUM: 305 mg/dL — AB (ref 700–1600)
IgM, Serum: <6 mg/dL — ABNORMAL LOW (ref 15–143)
Total Protein ELP: 6.5 g/dL (ref 6.0–8.5)

## 2015-03-24 DIAGNOSIS — D689 Coagulation defect, unspecified: Secondary | ICD-10-CM | POA: Diagnosis not present

## 2015-03-24 DIAGNOSIS — N186 End stage renal disease: Secondary | ICD-10-CM | POA: Diagnosis not present

## 2015-03-27 DIAGNOSIS — D689 Coagulation defect, unspecified: Secondary | ICD-10-CM | POA: Diagnosis not present

## 2015-03-27 DIAGNOSIS — N186 End stage renal disease: Secondary | ICD-10-CM | POA: Diagnosis not present

## 2015-03-29 DIAGNOSIS — D689 Coagulation defect, unspecified: Secondary | ICD-10-CM | POA: Diagnosis not present

## 2015-03-29 DIAGNOSIS — N186 End stage renal disease: Secondary | ICD-10-CM | POA: Diagnosis not present

## 2015-03-31 DIAGNOSIS — D689 Coagulation defect, unspecified: Secondary | ICD-10-CM | POA: Diagnosis not present

## 2015-03-31 DIAGNOSIS — N186 End stage renal disease: Secondary | ICD-10-CM | POA: Diagnosis not present

## 2015-04-03 DIAGNOSIS — N186 End stage renal disease: Secondary | ICD-10-CM | POA: Diagnosis not present

## 2015-04-03 DIAGNOSIS — D689 Coagulation defect, unspecified: Secondary | ICD-10-CM | POA: Diagnosis not present

## 2015-04-05 DIAGNOSIS — N186 End stage renal disease: Secondary | ICD-10-CM | POA: Diagnosis not present

## 2015-04-05 DIAGNOSIS — D689 Coagulation defect, unspecified: Secondary | ICD-10-CM | POA: Diagnosis not present

## 2015-04-07 ENCOUNTER — Other Ambulatory Visit: Payer: Self-pay

## 2015-04-07 DIAGNOSIS — N186 End stage renal disease: Secondary | ICD-10-CM | POA: Diagnosis not present

## 2015-04-07 DIAGNOSIS — D689 Coagulation defect, unspecified: Secondary | ICD-10-CM | POA: Diagnosis not present

## 2015-04-07 DIAGNOSIS — C9000 Multiple myeloma not having achieved remission: Secondary | ICD-10-CM

## 2015-04-09 DIAGNOSIS — Z992 Dependence on renal dialysis: Secondary | ICD-10-CM

## 2015-04-09 DIAGNOSIS — I10 Essential (primary) hypertension: Secondary | ICD-10-CM | POA: Insufficient documentation

## 2015-04-09 DIAGNOSIS — I2510 Atherosclerotic heart disease of native coronary artery without angina pectoris: Secondary | ICD-10-CM | POA: Insufficient documentation

## 2015-04-09 DIAGNOSIS — N186 End stage renal disease: Secondary | ICD-10-CM | POA: Insufficient documentation

## 2015-04-09 DIAGNOSIS — M109 Gout, unspecified: Secondary | ICD-10-CM | POA: Insufficient documentation

## 2015-04-09 DIAGNOSIS — K219 Gastro-esophageal reflux disease without esophagitis: Secondary | ICD-10-CM | POA: Insufficient documentation

## 2015-04-09 DIAGNOSIS — C9000 Multiple myeloma not having achieved remission: Secondary | ICD-10-CM | POA: Insufficient documentation

## 2015-04-09 DIAGNOSIS — N19 Unspecified kidney failure: Secondary | ICD-10-CM | POA: Insufficient documentation

## 2015-04-09 DIAGNOSIS — E785 Hyperlipidemia, unspecified: Secondary | ICD-10-CM | POA: Insufficient documentation

## 2015-04-09 DIAGNOSIS — J449 Chronic obstructive pulmonary disease, unspecified: Secondary | ICD-10-CM | POA: Insufficient documentation

## 2015-04-09 DIAGNOSIS — R413 Other amnesia: Secondary | ICD-10-CM | POA: Insufficient documentation

## 2015-04-09 DIAGNOSIS — I252 Old myocardial infarction: Secondary | ICD-10-CM | POA: Insufficient documentation

## 2015-04-10 DIAGNOSIS — D689 Coagulation defect, unspecified: Secondary | ICD-10-CM | POA: Diagnosis not present

## 2015-04-10 DIAGNOSIS — N186 End stage renal disease: Secondary | ICD-10-CM | POA: Diagnosis not present

## 2015-04-11 ENCOUNTER — Other Ambulatory Visit: Payer: Self-pay

## 2015-04-11 ENCOUNTER — Ambulatory Visit: Payer: Self-pay | Admitting: Internal Medicine

## 2015-04-11 DIAGNOSIS — N186 End stage renal disease: Secondary | ICD-10-CM | POA: Diagnosis not present

## 2015-04-11 DIAGNOSIS — Z992 Dependence on renal dialysis: Secondary | ICD-10-CM | POA: Diagnosis not present

## 2015-04-11 DIAGNOSIS — I120 Hypertensive chronic kidney disease with stage 5 chronic kidney disease or end stage renal disease: Secondary | ICD-10-CM | POA: Diagnosis not present

## 2015-04-12 ENCOUNTER — Inpatient Hospital Stay (HOSPITAL_BASED_OUTPATIENT_CLINIC_OR_DEPARTMENT_OTHER): Payer: Medicare Other | Admitting: Hematology and Oncology

## 2015-04-12 ENCOUNTER — Other Ambulatory Visit: Payer: Self-pay | Admitting: *Deleted

## 2015-04-12 ENCOUNTER — Ambulatory Visit
Admission: RE | Admit: 2015-04-12 | Discharge: 2015-04-12 | Disposition: A | Discharge status: Home / Self Care | Payer: Medicare Other | Source: Ambulatory Visit | Attending: Internal Medicine | Admitting: Internal Medicine

## 2015-04-12 ENCOUNTER — Encounter: Payer: Self-pay | Admitting: Hematology and Oncology

## 2015-04-12 ENCOUNTER — Ambulatory Visit
Admission: RE | Admit: 2015-04-12 | Discharge: 2015-04-12 | Disposition: A | Discharge status: Home / Self Care | Payer: Medicare Other | Source: Ambulatory Visit | Attending: Family Medicine | Admitting: Family Medicine

## 2015-04-12 ENCOUNTER — Inpatient Hospital Stay: Payer: Medicare Other | Attending: Hematology and Oncology

## 2015-04-12 VITALS — BP 114/58 | HR 76 | Temp 96.0°F | Wt 201.5 lb

## 2015-04-12 DIAGNOSIS — Z888 Allergy status to other drugs, medicaments and biological substances status: Secondary | ICD-10-CM | POA: Insufficient documentation

## 2015-04-12 DIAGNOSIS — D509 Iron deficiency anemia, unspecified: Secondary | ICD-10-CM | POA: Diagnosis not present

## 2015-04-12 DIAGNOSIS — D689 Coagulation defect, unspecified: Secondary | ICD-10-CM | POA: Diagnosis not present

## 2015-04-12 DIAGNOSIS — I252 Old myocardial infarction: Secondary | ICD-10-CM | POA: Diagnosis not present

## 2015-04-12 DIAGNOSIS — Z992 Dependence on renal dialysis: Secondary | ICD-10-CM

## 2015-04-12 DIAGNOSIS — Z9221 Personal history of antineoplastic chemotherapy: Secondary | ICD-10-CM

## 2015-04-12 DIAGNOSIS — C9000 Multiple myeloma not having achieved remission: Secondary | ICD-10-CM

## 2015-04-12 DIAGNOSIS — N186 End stage renal disease: Secondary | ICD-10-CM | POA: Diagnosis not present

## 2015-04-12 DIAGNOSIS — Z7982 Long term (current) use of aspirin: Secondary | ICD-10-CM | POA: Diagnosis not present

## 2015-04-12 DIAGNOSIS — Z79899 Other long term (current) drug therapy: Secondary | ICD-10-CM | POA: Insufficient documentation

## 2015-04-12 DIAGNOSIS — D649 Anemia, unspecified: Secondary | ICD-10-CM

## 2015-04-12 LAB — CBC
HCT: 36.4 % — ABNORMAL LOW (ref 40.0–52.0)
Hemoglobin: 12.4 g/dL — ABNORMAL LOW (ref 13.0–18.0)
MCH: 32.6 pg (ref 26.0–34.0)
MCHC: 33.9 g/dL (ref 32.0–36.0)
MCV: 96.3 fL (ref 80.0–100.0)
Platelets: 128 10*3/uL — ABNORMAL LOW (ref 150–440)
RBC: 3.78 MIL/uL — ABNORMAL LOW (ref 4.40–5.90)
RDW: 14.3 % (ref 11.5–14.5)
WBC: 4.3 10*3/uL (ref 3.8–10.6)

## 2015-04-12 NOTE — Progress Notes (Signed)
Pt denies any pain from MM.He had a THR on L hip in Jan 2015. That L hip aches from time to time He started dialysis  In Jan 214. Voids small scant amount of urine. Appetite is good. Regular BM's. Ambulatory.

## 2015-04-13 LAB — PROTEIN ELECTROPHORESIS, SERUM
A/G Ratio: 1.3 (ref 0.7–2.0)
Albumin ELP: 4 g/dL (ref 3.2–5.6)
Alpha-1-Globulin: 0.2 g/dL (ref 0.1–0.4)
Alpha-2-Globulin: 0.7 g/dL (ref 0.4–1.2)
Beta Globulin: 1.7 g/dL — ABNORMAL HIGH (ref 0.6–1.3)
Gamma Globulin: 0.4 g/dL — ABNORMAL LOW (ref 0.5–1.6)
Globulin, Total: 3 g/dL (ref 2.0–4.5)
M-Spike, %: 0.8 g/dL — ABNORMAL HIGH
Total Protein ELP: 7 g/dL (ref 6.0–8.5)

## 2015-04-13 LAB — KAPPA/LAMBDA LIGHT CHAINS
Kappa free light chain: 955.69 mg/L — ABNORMAL HIGH (ref 3.30–19.40)
Kappa, lambda light chain ratio: 62.79 — ABNORMAL HIGH (ref 0.26–1.65)
Lambda free light chains: 15.22 mg/L (ref 5.71–26.30)

## 2015-04-14 DIAGNOSIS — D689 Coagulation defect, unspecified: Secondary | ICD-10-CM | POA: Diagnosis not present

## 2015-04-14 DIAGNOSIS — D509 Iron deficiency anemia, unspecified: Secondary | ICD-10-CM | POA: Diagnosis not present

## 2015-04-14 DIAGNOSIS — N186 End stage renal disease: Secondary | ICD-10-CM | POA: Diagnosis not present

## 2015-04-17 DIAGNOSIS — D689 Coagulation defect, unspecified: Secondary | ICD-10-CM | POA: Diagnosis not present

## 2015-04-17 DIAGNOSIS — D509 Iron deficiency anemia, unspecified: Secondary | ICD-10-CM | POA: Diagnosis not present

## 2015-04-17 DIAGNOSIS — N186 End stage renal disease: Secondary | ICD-10-CM | POA: Diagnosis not present

## 2015-04-19 DIAGNOSIS — D689 Coagulation defect, unspecified: Secondary | ICD-10-CM | POA: Diagnosis not present

## 2015-04-19 DIAGNOSIS — D509 Iron deficiency anemia, unspecified: Secondary | ICD-10-CM | POA: Diagnosis not present

## 2015-04-19 DIAGNOSIS — N186 End stage renal disease: Secondary | ICD-10-CM | POA: Diagnosis not present

## 2015-04-21 DIAGNOSIS — D509 Iron deficiency anemia, unspecified: Secondary | ICD-10-CM | POA: Diagnosis not present

## 2015-04-21 DIAGNOSIS — D689 Coagulation defect, unspecified: Secondary | ICD-10-CM | POA: Diagnosis not present

## 2015-04-21 DIAGNOSIS — N186 End stage renal disease: Secondary | ICD-10-CM | POA: Diagnosis not present

## 2015-04-24 DIAGNOSIS — D689 Coagulation defect, unspecified: Secondary | ICD-10-CM | POA: Diagnosis not present

## 2015-04-24 DIAGNOSIS — N186 End stage renal disease: Secondary | ICD-10-CM | POA: Diagnosis not present

## 2015-04-24 DIAGNOSIS — D509 Iron deficiency anemia, unspecified: Secondary | ICD-10-CM | POA: Diagnosis not present

## 2015-04-27 DIAGNOSIS — D689 Coagulation defect, unspecified: Secondary | ICD-10-CM | POA: Diagnosis not present

## 2015-04-27 DIAGNOSIS — D509 Iron deficiency anemia, unspecified: Secondary | ICD-10-CM | POA: Diagnosis not present

## 2015-04-27 DIAGNOSIS — N186 End stage renal disease: Secondary | ICD-10-CM | POA: Diagnosis not present

## 2015-04-29 DIAGNOSIS — D689 Coagulation defect, unspecified: Secondary | ICD-10-CM | POA: Diagnosis not present

## 2015-04-29 DIAGNOSIS — D509 Iron deficiency anemia, unspecified: Secondary | ICD-10-CM | POA: Diagnosis not present

## 2015-04-29 DIAGNOSIS — N186 End stage renal disease: Secondary | ICD-10-CM | POA: Diagnosis not present

## 2015-05-02 DIAGNOSIS — D509 Iron deficiency anemia, unspecified: Secondary | ICD-10-CM | POA: Diagnosis not present

## 2015-05-02 DIAGNOSIS — N186 End stage renal disease: Secondary | ICD-10-CM | POA: Diagnosis not present

## 2015-05-02 DIAGNOSIS — D689 Coagulation defect, unspecified: Secondary | ICD-10-CM | POA: Diagnosis not present

## 2015-05-04 ENCOUNTER — Inpatient Hospital Stay (HOSPITAL_BASED_OUTPATIENT_CLINIC_OR_DEPARTMENT_OTHER): Payer: Medicare Other | Admitting: Internal Medicine

## 2015-05-04 VITALS — BP 131/60 | HR 66 | Temp 95.3°F | Resp 18 | Ht 71.0 in | Wt 202.8 lb

## 2015-05-04 DIAGNOSIS — D509 Iron deficiency anemia, unspecified: Secondary | ICD-10-CM | POA: Diagnosis not present

## 2015-05-04 DIAGNOSIS — N186 End stage renal disease: Secondary | ICD-10-CM | POA: Diagnosis not present

## 2015-05-04 DIAGNOSIS — D689 Coagulation defect, unspecified: Secondary | ICD-10-CM | POA: Diagnosis not present

## 2015-05-04 DIAGNOSIS — C9000 Multiple myeloma not having achieved remission: Secondary | ICD-10-CM

## 2015-05-04 NOTE — Progress Notes (Signed)
Patient s/b Dr.Corcoran on 04/12/15, here for followup but states that his dialysis schedule has been changed to Tues/Thur/Sat and cannot come on Thursday mornings. He would like to keep appt on Wednesdays in [place].  Have d/w Dr.Corcoran who wishes to see him back in 9 weeks with myeloma indices, have requested labs to be drawn the week before on Aug 17, and to see Dr.Corcoran on Aug 24.

## 2015-05-06 DIAGNOSIS — N186 End stage renal disease: Secondary | ICD-10-CM | POA: Diagnosis not present

## 2015-05-06 DIAGNOSIS — D509 Iron deficiency anemia, unspecified: Secondary | ICD-10-CM | POA: Diagnosis not present

## 2015-05-06 DIAGNOSIS — D689 Coagulation defect, unspecified: Secondary | ICD-10-CM | POA: Diagnosis not present

## 2015-05-09 DIAGNOSIS — D689 Coagulation defect, unspecified: Secondary | ICD-10-CM | POA: Diagnosis not present

## 2015-05-09 DIAGNOSIS — N186 End stage renal disease: Secondary | ICD-10-CM | POA: Diagnosis not present

## 2015-05-09 DIAGNOSIS — D509 Iron deficiency anemia, unspecified: Secondary | ICD-10-CM | POA: Diagnosis not present

## 2015-05-11 DIAGNOSIS — N186 End stage renal disease: Secondary | ICD-10-CM | POA: Diagnosis not present

## 2015-05-11 DIAGNOSIS — D689 Coagulation defect, unspecified: Secondary | ICD-10-CM | POA: Diagnosis not present

## 2015-05-11 DIAGNOSIS — D509 Iron deficiency anemia, unspecified: Secondary | ICD-10-CM | POA: Diagnosis not present

## 2015-05-13 DIAGNOSIS — N2581 Secondary hyperparathyroidism of renal origin: Secondary | ICD-10-CM | POA: Diagnosis not present

## 2015-05-13 DIAGNOSIS — D689 Coagulation defect, unspecified: Secondary | ICD-10-CM | POA: Diagnosis not present

## 2015-05-13 DIAGNOSIS — D509 Iron deficiency anemia, unspecified: Secondary | ICD-10-CM | POA: Diagnosis not present

## 2015-05-13 DIAGNOSIS — N186 End stage renal disease: Secondary | ICD-10-CM | POA: Diagnosis not present

## 2015-05-16 DIAGNOSIS — D689 Coagulation defect, unspecified: Secondary | ICD-10-CM | POA: Diagnosis not present

## 2015-05-16 DIAGNOSIS — D509 Iron deficiency anemia, unspecified: Secondary | ICD-10-CM | POA: Diagnosis not present

## 2015-05-16 DIAGNOSIS — N186 End stage renal disease: Secondary | ICD-10-CM | POA: Diagnosis not present

## 2015-05-16 DIAGNOSIS — N2581 Secondary hyperparathyroidism of renal origin: Secondary | ICD-10-CM | POA: Diagnosis not present

## 2015-05-17 DIAGNOSIS — E780 Pure hypercholesterolemia: Secondary | ICD-10-CM | POA: Diagnosis not present

## 2015-05-17 DIAGNOSIS — I25119 Atherosclerotic heart disease of native coronary artery with unspecified angina pectoris: Secondary | ICD-10-CM | POA: Diagnosis not present

## 2015-05-17 DIAGNOSIS — E782 Mixed hyperlipidemia: Secondary | ICD-10-CM | POA: Diagnosis not present

## 2015-05-17 DIAGNOSIS — I10 Essential (primary) hypertension: Secondary | ICD-10-CM | POA: Diagnosis not present

## 2015-05-18 DIAGNOSIS — N186 End stage renal disease: Secondary | ICD-10-CM | POA: Diagnosis not present

## 2015-05-18 DIAGNOSIS — D689 Coagulation defect, unspecified: Secondary | ICD-10-CM | POA: Diagnosis not present

## 2015-05-18 DIAGNOSIS — N2581 Secondary hyperparathyroidism of renal origin: Secondary | ICD-10-CM | POA: Diagnosis not present

## 2015-05-18 DIAGNOSIS — D509 Iron deficiency anemia, unspecified: Secondary | ICD-10-CM | POA: Diagnosis not present

## 2015-05-20 DIAGNOSIS — D689 Coagulation defect, unspecified: Secondary | ICD-10-CM | POA: Diagnosis not present

## 2015-05-20 DIAGNOSIS — D509 Iron deficiency anemia, unspecified: Secondary | ICD-10-CM | POA: Diagnosis not present

## 2015-05-20 DIAGNOSIS — N186 End stage renal disease: Secondary | ICD-10-CM | POA: Diagnosis not present

## 2015-05-20 DIAGNOSIS — N2581 Secondary hyperparathyroidism of renal origin: Secondary | ICD-10-CM | POA: Diagnosis not present

## 2015-05-23 DIAGNOSIS — N186 End stage renal disease: Secondary | ICD-10-CM | POA: Diagnosis not present

## 2015-05-23 DIAGNOSIS — N2581 Secondary hyperparathyroidism of renal origin: Secondary | ICD-10-CM | POA: Diagnosis not present

## 2015-05-23 DIAGNOSIS — D689 Coagulation defect, unspecified: Secondary | ICD-10-CM | POA: Diagnosis not present

## 2015-05-23 DIAGNOSIS — D509 Iron deficiency anemia, unspecified: Secondary | ICD-10-CM | POA: Diagnosis not present

## 2015-05-25 DIAGNOSIS — D509 Iron deficiency anemia, unspecified: Secondary | ICD-10-CM | POA: Diagnosis not present

## 2015-05-25 DIAGNOSIS — D689 Coagulation defect, unspecified: Secondary | ICD-10-CM | POA: Diagnosis not present

## 2015-05-25 DIAGNOSIS — N186 End stage renal disease: Secondary | ICD-10-CM | POA: Diagnosis not present

## 2015-05-25 DIAGNOSIS — N2581 Secondary hyperparathyroidism of renal origin: Secondary | ICD-10-CM | POA: Diagnosis not present

## 2015-05-26 DIAGNOSIS — M12812 Other specific arthropathies, not elsewhere classified, left shoulder: Secondary | ICD-10-CM | POA: Diagnosis not present

## 2015-05-26 DIAGNOSIS — M25512 Pain in left shoulder: Secondary | ICD-10-CM | POA: Diagnosis not present

## 2015-05-27 DIAGNOSIS — N2581 Secondary hyperparathyroidism of renal origin: Secondary | ICD-10-CM | POA: Diagnosis not present

## 2015-05-27 DIAGNOSIS — N186 End stage renal disease: Secondary | ICD-10-CM | POA: Diagnosis not present

## 2015-05-27 DIAGNOSIS — D689 Coagulation defect, unspecified: Secondary | ICD-10-CM | POA: Diagnosis not present

## 2015-05-27 DIAGNOSIS — D509 Iron deficiency anemia, unspecified: Secondary | ICD-10-CM | POA: Diagnosis not present

## 2015-05-30 DIAGNOSIS — N2581 Secondary hyperparathyroidism of renal origin: Secondary | ICD-10-CM | POA: Diagnosis not present

## 2015-05-30 DIAGNOSIS — D689 Coagulation defect, unspecified: Secondary | ICD-10-CM | POA: Diagnosis not present

## 2015-05-30 DIAGNOSIS — D509 Iron deficiency anemia, unspecified: Secondary | ICD-10-CM | POA: Diagnosis not present

## 2015-05-30 DIAGNOSIS — N186 End stage renal disease: Secondary | ICD-10-CM | POA: Diagnosis not present

## 2015-06-01 DIAGNOSIS — N186 End stage renal disease: Secondary | ICD-10-CM | POA: Diagnosis not present

## 2015-06-01 DIAGNOSIS — D689 Coagulation defect, unspecified: Secondary | ICD-10-CM | POA: Diagnosis not present

## 2015-06-01 DIAGNOSIS — D509 Iron deficiency anemia, unspecified: Secondary | ICD-10-CM | POA: Diagnosis not present

## 2015-06-01 DIAGNOSIS — N2581 Secondary hyperparathyroidism of renal origin: Secondary | ICD-10-CM | POA: Diagnosis not present

## 2015-06-03 DIAGNOSIS — N2581 Secondary hyperparathyroidism of renal origin: Secondary | ICD-10-CM | POA: Diagnosis not present

## 2015-06-03 DIAGNOSIS — N186 End stage renal disease: Secondary | ICD-10-CM | POA: Diagnosis not present

## 2015-06-03 DIAGNOSIS — D689 Coagulation defect, unspecified: Secondary | ICD-10-CM | POA: Diagnosis not present

## 2015-06-03 DIAGNOSIS — D509 Iron deficiency anemia, unspecified: Secondary | ICD-10-CM | POA: Diagnosis not present

## 2015-06-06 DIAGNOSIS — D689 Coagulation defect, unspecified: Secondary | ICD-10-CM | POA: Diagnosis not present

## 2015-06-06 DIAGNOSIS — D509 Iron deficiency anemia, unspecified: Secondary | ICD-10-CM | POA: Diagnosis not present

## 2015-06-06 DIAGNOSIS — N2581 Secondary hyperparathyroidism of renal origin: Secondary | ICD-10-CM | POA: Diagnosis not present

## 2015-06-06 DIAGNOSIS — N186 End stage renal disease: Secondary | ICD-10-CM | POA: Diagnosis not present

## 2015-06-08 ENCOUNTER — Other Ambulatory Visit: Payer: Self-pay | Admitting: Physician Assistant

## 2015-06-08 DIAGNOSIS — D509 Iron deficiency anemia, unspecified: Secondary | ICD-10-CM | POA: Diagnosis not present

## 2015-06-08 DIAGNOSIS — N186 End stage renal disease: Secondary | ICD-10-CM | POA: Diagnosis not present

## 2015-06-08 DIAGNOSIS — N2581 Secondary hyperparathyroidism of renal origin: Secondary | ICD-10-CM | POA: Diagnosis not present

## 2015-06-08 DIAGNOSIS — M75102 Unspecified rotator cuff tear or rupture of left shoulder, not specified as traumatic: Secondary | ICD-10-CM

## 2015-06-08 DIAGNOSIS — D689 Coagulation defect, unspecified: Secondary | ICD-10-CM | POA: Diagnosis not present

## 2015-06-10 DIAGNOSIS — N186 End stage renal disease: Secondary | ICD-10-CM | POA: Diagnosis not present

## 2015-06-10 DIAGNOSIS — D689 Coagulation defect, unspecified: Secondary | ICD-10-CM | POA: Diagnosis not present

## 2015-06-10 DIAGNOSIS — N2581 Secondary hyperparathyroidism of renal origin: Secondary | ICD-10-CM | POA: Diagnosis not present

## 2015-06-10 DIAGNOSIS — D509 Iron deficiency anemia, unspecified: Secondary | ICD-10-CM | POA: Diagnosis not present

## 2015-06-11 DIAGNOSIS — Z992 Dependence on renal dialysis: Secondary | ICD-10-CM | POA: Diagnosis not present

## 2015-06-11 DIAGNOSIS — N186 End stage renal disease: Secondary | ICD-10-CM | POA: Diagnosis not present

## 2015-06-11 DIAGNOSIS — I120 Hypertensive chronic kidney disease with stage 5 chronic kidney disease or end stage renal disease: Secondary | ICD-10-CM | POA: Diagnosis not present

## 2015-06-13 DIAGNOSIS — D509 Iron deficiency anemia, unspecified: Secondary | ICD-10-CM | POA: Diagnosis not present

## 2015-06-13 DIAGNOSIS — D689 Coagulation defect, unspecified: Secondary | ICD-10-CM | POA: Diagnosis not present

## 2015-06-13 DIAGNOSIS — N186 End stage renal disease: Secondary | ICD-10-CM | POA: Diagnosis not present

## 2015-06-13 DIAGNOSIS — N2581 Secondary hyperparathyroidism of renal origin: Secondary | ICD-10-CM | POA: Diagnosis not present

## 2015-06-14 ENCOUNTER — Ambulatory Visit
Admission: RE | Admit: 2015-06-14 | Discharge: 2015-06-14 | Disposition: A | Discharge status: Home / Self Care | Payer: Medicare Other | Source: Ambulatory Visit | Attending: Physician Assistant | Admitting: Physician Assistant

## 2015-06-14 DIAGNOSIS — M25412 Effusion, left shoulder: Secondary | ICD-10-CM | POA: Insufficient documentation

## 2015-06-14 DIAGNOSIS — M75102 Unspecified rotator cuff tear or rupture of left shoulder, not specified as traumatic: Secondary | ICD-10-CM

## 2015-06-14 DIAGNOSIS — M67814 Other specified disorders of tendon, left shoulder: Secondary | ICD-10-CM | POA: Diagnosis not present

## 2015-06-14 DIAGNOSIS — M7582 Other shoulder lesions, left shoulder: Secondary | ICD-10-CM | POA: Diagnosis present

## 2015-06-14 DIAGNOSIS — M25512 Pain in left shoulder: Secondary | ICD-10-CM | POA: Diagnosis not present

## 2015-06-14 DIAGNOSIS — M19012 Primary osteoarthritis, left shoulder: Secondary | ICD-10-CM | POA: Diagnosis not present

## 2015-06-15 DIAGNOSIS — N2581 Secondary hyperparathyroidism of renal origin: Secondary | ICD-10-CM | POA: Diagnosis not present

## 2015-06-15 DIAGNOSIS — N186 End stage renal disease: Secondary | ICD-10-CM | POA: Diagnosis not present

## 2015-06-15 DIAGNOSIS — D509 Iron deficiency anemia, unspecified: Secondary | ICD-10-CM | POA: Diagnosis not present

## 2015-06-15 DIAGNOSIS — D689 Coagulation defect, unspecified: Secondary | ICD-10-CM | POA: Diagnosis not present

## 2015-06-17 DIAGNOSIS — D689 Coagulation defect, unspecified: Secondary | ICD-10-CM | POA: Diagnosis not present

## 2015-06-17 DIAGNOSIS — D509 Iron deficiency anemia, unspecified: Secondary | ICD-10-CM | POA: Diagnosis not present

## 2015-06-17 DIAGNOSIS — N186 End stage renal disease: Secondary | ICD-10-CM | POA: Diagnosis not present

## 2015-06-17 DIAGNOSIS — N2581 Secondary hyperparathyroidism of renal origin: Secondary | ICD-10-CM | POA: Diagnosis not present

## 2015-06-20 DIAGNOSIS — D689 Coagulation defect, unspecified: Secondary | ICD-10-CM | POA: Diagnosis not present

## 2015-06-20 DIAGNOSIS — D509 Iron deficiency anemia, unspecified: Secondary | ICD-10-CM | POA: Diagnosis not present

## 2015-06-20 DIAGNOSIS — N2581 Secondary hyperparathyroidism of renal origin: Secondary | ICD-10-CM | POA: Diagnosis not present

## 2015-06-20 DIAGNOSIS — N186 End stage renal disease: Secondary | ICD-10-CM | POA: Diagnosis not present

## 2015-06-22 DIAGNOSIS — N2581 Secondary hyperparathyroidism of renal origin: Secondary | ICD-10-CM | POA: Diagnosis not present

## 2015-06-22 DIAGNOSIS — D689 Coagulation defect, unspecified: Secondary | ICD-10-CM | POA: Diagnosis not present

## 2015-06-22 DIAGNOSIS — N186 End stage renal disease: Secondary | ICD-10-CM | POA: Diagnosis not present

## 2015-06-22 DIAGNOSIS — D509 Iron deficiency anemia, unspecified: Secondary | ICD-10-CM | POA: Diagnosis not present

## 2015-06-24 DIAGNOSIS — N186 End stage renal disease: Secondary | ICD-10-CM | POA: Diagnosis not present

## 2015-06-24 DIAGNOSIS — D509 Iron deficiency anemia, unspecified: Secondary | ICD-10-CM | POA: Diagnosis not present

## 2015-06-24 DIAGNOSIS — D689 Coagulation defect, unspecified: Secondary | ICD-10-CM | POA: Diagnosis not present

## 2015-06-24 DIAGNOSIS — N2581 Secondary hyperparathyroidism of renal origin: Secondary | ICD-10-CM | POA: Diagnosis not present

## 2015-06-27 ENCOUNTER — Ambulatory Visit (INDEPENDENT_AMBULATORY_CARE_PROVIDER_SITE_OTHER): Payer: Medicare Other | Admitting: Family Medicine

## 2015-06-27 ENCOUNTER — Encounter: Payer: Self-pay | Admitting: Family Medicine

## 2015-06-27 VITALS — BP 102/50 | HR 80 | Temp 97.6°F | Resp 16 | Wt 197.0 lb

## 2015-06-27 DIAGNOSIS — N2581 Secondary hyperparathyroidism of renal origin: Secondary | ICD-10-CM | POA: Diagnosis not present

## 2015-06-27 DIAGNOSIS — K219 Gastro-esophageal reflux disease without esophagitis: Secondary | ICD-10-CM | POA: Diagnosis not present

## 2015-06-27 DIAGNOSIS — R413 Other amnesia: Secondary | ICD-10-CM

## 2015-06-27 DIAGNOSIS — D509 Iron deficiency anemia, unspecified: Secondary | ICD-10-CM | POA: Diagnosis not present

## 2015-06-27 DIAGNOSIS — N186 End stage renal disease: Secondary | ICD-10-CM | POA: Diagnosis not present

## 2015-06-27 DIAGNOSIS — D689 Coagulation defect, unspecified: Secondary | ICD-10-CM | POA: Diagnosis not present

## 2015-06-27 DIAGNOSIS — I10 Essential (primary) hypertension: Secondary | ICD-10-CM

## 2015-06-27 NOTE — Progress Notes (Signed)
Patient ID: Frank Huang, male   DOB: 22-Jun-1932, 81 y.o.   MRN: [id number]   Frank Huang  MRN: [id number] DOB: 02/24/1932  Subjective:  HPI   1. Essential hypertension Patient is an 81 year old male who  Presents for a 6 month follow up.  He states he has not had any blood pressure problems in years, nor has he had any medications in years.  His last visit was on 12/29/14 and his blood pressure at that time was 112/72.  2. Gastroesophageal reflux disease, esophagitis presence not specified Patient also presents por follow up of his acid reflux.  He is on Omeprazole and states he does not take it every day as he does not need it daily.  His wife states that Dr. Ubaldo Glassing wanted him to take it every day.  Patient said he did not realize that and would take it daily.  3. Memory change Patient is also here to have his memory checked.  He states he is doing ok but his wife states otherwise.  When asked the patient said he never gets lost driving home but after discussion with his wife he does say that he has gotten lost when going to some places he had been before.  The patient states it is more like when he goes to [facility] to see his doctor he gets turned around.  The wife did relate that he was looking for his socks the other day and she asked him "don't you have them on your feet?" and as it was he did have them on his feet.   Patient Active Problem List   Diagnosis Date Noted  . Arteriosclerosis of coronary artery 04/09/2015  . CAFL (chronic airflow limitation) 04/09/2015  . Chronic kidney disease requiring chronic dialysis 04/09/2015  . Gastro-esophageal reflux disease without esophagitis 04/09/2015  . Gout 04/09/2015  . H/O acute myocardial infarction 04/09/2015  . HLD (hyperlipidemia) 04/09/2015  . BP (high blood pressure) 04/09/2015  . Bad memory 04/09/2015  . 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 Kidney failure 04/09/2015  . Chronic kidney disease (CKD),  stage V 07/20/2013  . Absolute anemia 03/31/2013  . Neuropathy 03/31/2013    Past Medical History  Diagnosis Date  . Cancer   . Multiple myeloma   . Myocardial infarction   . Chronic kidney disease     Social History   Social History  . Marital Status: Married    Spouse Name: N/A  . Number of Children: N/A  . Years of Education: N/A   Occupational History  . Not on file.   Social History Main Topics  . Smoking status: Current Some Day Smoker  . Smokeless tobacco: Not on file     Comment: smokes about 3 cigarettes per week  . Alcohol Use: No  . Drug Use: No  . Sexual Activity: Not on file   Other Topics Concern  . Not on file   Social History Narrative    Outpatient Prescriptions Prior to Visit  Medication Sig Dispense Refill  . lovastatin (MEVACOR) 40 MG tablet TAKE 1 TABLET BY MOUTH EVERY DAY FOR HIGH CHOLESTEROL    . Multiple Vitamin (MULTI-VITAMINS) TABS Take by mouth.    Marland Kitchen omeprazole (PRILOSEC) 20 MG capsule TAKE 1 CAPSULE BY MOUTH EVERY DAY.     No facility-administered medications prior to visit.    No Known Allergies  Review of Systems  Constitutional: Negative for fever, chills and malaise/fatigue.  Cardiovascular:  Negative for chest pain, palpitations, claudication and leg swelling.       Patient does state he has numbness in his feet but also states he is used to it as he has had it for about 3 years.  Neurological: Negative for dizziness, weakness and headaches.       Progressive memory loss.  Psychiatric/Behavioral: Negative.    Objective:  BP 102/50 mmHg  Pulse 80  Temp(Src) 97.6 F (36.4 C) (Oral)  Resp 16  Wt 197 lb (89.359 kg)  Physical Exam  Constitutional: He is well-developed, well-nourished, and in no distress.  HENT:  Head: Normocephalic and atraumatic.  Right Ear: External ear normal.  Left Ear: External ear normal.  Nose: Nose normal.  Eyes: Conjunctivae are normal.  Neck: Neck supple.  Cardiovascular: Normal rate,  regular rhythm and normal heart sounds.   Pulmonary/Chest: Effort normal and breath sounds normal.  Abdominal: Soft. Bowel sounds are normal.  Neurological: He is alert.  Skin: Skin is warm and dry.  Psychiatric: Mood, affect and judgment normal.    Assessment and Plan :  Essential hypertension  Gastroesophageal reflux disease, esophagitis presence not specified  Memory change/ Alzheimers dementia Pt advised to quit driving and wife instructed to take keys--she and pt agree to this. More than 50% of time spent in counselling I have done the exam and reviewed the above chart and it is accurate to the best of my knowledge.    Miguel Aschoff MD [facility] 06/27/2015 11:25 AM

## 2015-06-28 ENCOUNTER — Inpatient Hospital Stay: Payer: Medicare Other | Attending: Hematology and Oncology

## 2015-06-28 DIAGNOSIS — Z992 Dependence on renal dialysis: Secondary | ICD-10-CM | POA: Diagnosis not present

## 2015-06-28 DIAGNOSIS — I252 Old myocardial infarction: Secondary | ICD-10-CM | POA: Insufficient documentation

## 2015-06-28 DIAGNOSIS — Z800 Family history of malignant neoplasm of digestive organs: Secondary | ICD-10-CM | POA: Diagnosis not present

## 2015-06-28 DIAGNOSIS — M19019 Primary osteoarthritis, unspecified shoulder: Secondary | ICD-10-CM | POA: Diagnosis not present

## 2015-06-28 DIAGNOSIS — Z7982 Long term (current) use of aspirin: Secondary | ICD-10-CM | POA: Insufficient documentation

## 2015-06-28 DIAGNOSIS — Z79899 Other long term (current) drug therapy: Secondary | ICD-10-CM | POA: Diagnosis not present

## 2015-06-28 DIAGNOSIS — E538 Deficiency of other specified B group vitamins: Secondary | ICD-10-CM | POA: Insufficient documentation

## 2015-06-28 DIAGNOSIS — N186 End stage renal disease: Secondary | ICD-10-CM | POA: Insufficient documentation

## 2015-06-28 DIAGNOSIS — C9000 Multiple myeloma not having achieved remission: Secondary | ICD-10-CM | POA: Diagnosis not present

## 2015-06-28 LAB — CBC WITH DIFFERENTIAL/PLATELET
Basophils Absolute: 0 10*3/uL (ref 0–0.1)
Basophils Relative: 1 %
Eosinophils Absolute: 0.1 10*3/uL (ref 0–0.7)
Eosinophils Relative: 3 %
HEMATOCRIT: 37 % — AB (ref 40.0–52.0)
HEMOGLOBIN: 12.5 g/dL — AB (ref 13.0–18.0)
LYMPHS ABS: 1.2 10*3/uL (ref 1.0–3.6)
Lymphocytes Relative: 29 %
MCH: 33 pg (ref 26.0–34.0)
MCHC: 33.8 g/dL (ref 32.0–36.0)
MCV: 97.5 fL (ref 80.0–100.0)
Monocytes Absolute: 0.2 10*3/uL (ref 0.2–1.0)
Monocytes Relative: 5 %
NEUTROS PCT: 62 %
Neutro Abs: 2.7 10*3/uL (ref 1.4–6.5)
Platelets: 110 10*3/uL — ABNORMAL LOW (ref 150–440)
RBC: 3.8 MIL/uL — AB (ref 4.40–5.90)
RDW: 14.1 % (ref 11.5–14.5)
WBC: 4.3 10*3/uL (ref 3.8–10.6)

## 2015-06-28 LAB — HEPATIC FUNCTION PANEL
ALT: 13 U/L — AB (ref 17–63)
AST: 17 U/L (ref 15–41)
Albumin: 4.1 g/dL (ref 3.5–5.0)
Alkaline Phosphatase: 77 U/L (ref 38–126)
Bilirubin, Direct: 0.1 mg/dL (ref 0.1–0.5)
Indirect Bilirubin: 0.8 mg/dL (ref 0.3–0.9)
TOTAL PROTEIN: 7.4 g/dL (ref 6.5–8.1)
Total Bilirubin: 0.9 mg/dL (ref 0.3–1.2)

## 2015-06-28 LAB — CALCIUM: Calcium: 9.4 mg/dL (ref 8.9–10.3)

## 2015-06-29 DIAGNOSIS — N186 End stage renal disease: Secondary | ICD-10-CM | POA: Diagnosis not present

## 2015-06-29 DIAGNOSIS — D509 Iron deficiency anemia, unspecified: Secondary | ICD-10-CM | POA: Diagnosis not present

## 2015-06-29 DIAGNOSIS — D689 Coagulation defect, unspecified: Secondary | ICD-10-CM | POA: Diagnosis not present

## 2015-06-29 DIAGNOSIS — N2581 Secondary hyperparathyroidism of renal origin: Secondary | ICD-10-CM | POA: Diagnosis not present

## 2015-06-29 LAB — PROTEIN ELECTROPHORESIS, SERUM
A/G Ratio: 1.2 (ref 0.7–1.7)
ALBUMIN ELP: 3.7 g/dL (ref 2.9–4.4)
Alpha-1-Globulin: 0.2 g/dL (ref 0.0–0.4)
Alpha-2-Globulin: 0.7 g/dL (ref 0.4–1.0)
BETA GLOBULIN: 1.6 g/dL — AB (ref 0.7–1.3)
Gamma Globulin: 0.5 g/dL (ref 0.4–1.8)
Globulin, Total: 3 g/dL (ref 2.2–3.9)
M-Spike, %: 0.9 g/dL — ABNORMAL HIGH
Total Protein ELP: 6.7 g/dL (ref 6.0–8.5)

## 2015-06-29 LAB — KAPPA/LAMBDA LIGHT CHAINS
KAPPA, LAMDA LIGHT CHAIN RATIO: 63.84 — AB (ref 0.26–1.65)
Kappa free light chain: 851.66 mg/L — ABNORMAL HIGH (ref 3.30–19.40)
Lambda free light chains: 13.34 mg/L (ref 5.71–26.30)

## 2015-07-01 ENCOUNTER — Other Ambulatory Visit: Payer: Self-pay

## 2015-07-01 ENCOUNTER — Observation Stay
Admission: EM | Admit: 2015-07-01 | Discharge: 2015-07-01 | Disposition: A | Discharge status: Home / Self Care | Payer: Medicare Other | Attending: Internal Medicine | Admitting: Internal Medicine

## 2015-07-01 ENCOUNTER — Observation Stay: Payer: Medicare Other

## 2015-07-01 ENCOUNTER — Encounter: Payer: Self-pay | Admitting: Emergency Medicine

## 2015-07-01 ENCOUNTER — Emergency Department: Payer: Medicare Other

## 2015-07-01 DIAGNOSIS — N186 End stage renal disease: Secondary | ICD-10-CM | POA: Diagnosis not present

## 2015-07-01 DIAGNOSIS — E785 Hyperlipidemia, unspecified: Secondary | ICD-10-CM | POA: Diagnosis not present

## 2015-07-01 DIAGNOSIS — Z955 Presence of coronary angioplasty implant and graft: Secondary | ICD-10-CM | POA: Diagnosis not present

## 2015-07-01 DIAGNOSIS — Z9889 Other specified postprocedural states: Secondary | ICD-10-CM | POA: Insufficient documentation

## 2015-07-01 DIAGNOSIS — R0789 Other chest pain: Secondary | ICD-10-CM | POA: Diagnosis not present

## 2015-07-01 DIAGNOSIS — C9001 Multiple myeloma in remission: Secondary | ICD-10-CM | POA: Diagnosis not present

## 2015-07-01 DIAGNOSIS — K219 Gastro-esophageal reflux disease without esophagitis: Secondary | ICD-10-CM | POA: Insufficient documentation

## 2015-07-01 DIAGNOSIS — Z800 Family history of malignant neoplasm of digestive organs: Secondary | ICD-10-CM | POA: Diagnosis not present

## 2015-07-01 DIAGNOSIS — C9000 Multiple myeloma not having achieved remission: Secondary | ICD-10-CM | POA: Diagnosis not present

## 2015-07-01 DIAGNOSIS — Z841 Family history of disorders of kidney and ureter: Secondary | ICD-10-CM | POA: Insufficient documentation

## 2015-07-01 DIAGNOSIS — Z808 Family history of malignant neoplasm of other organs or systems: Secondary | ICD-10-CM | POA: Diagnosis not present

## 2015-07-01 DIAGNOSIS — Z859 Personal history of malignant neoplasm, unspecified: Secondary | ICD-10-CM | POA: Diagnosis not present

## 2015-07-01 DIAGNOSIS — R079 Chest pain, unspecified: Principal | ICD-10-CM

## 2015-07-01 DIAGNOSIS — Z992 Dependence on renal dialysis: Secondary | ICD-10-CM | POA: Insufficient documentation

## 2015-07-01 DIAGNOSIS — Z820 Family history of epilepsy and other diseases of the nervous system: Secondary | ICD-10-CM | POA: Diagnosis not present

## 2015-07-01 DIAGNOSIS — I252 Old myocardial infarction: Secondary | ICD-10-CM | POA: Diagnosis not present

## 2015-07-01 DIAGNOSIS — N2581 Secondary hyperparathyroidism of renal origin: Secondary | ICD-10-CM | POA: Diagnosis not present

## 2015-07-01 DIAGNOSIS — I447 Left bundle-branch block, unspecified: Secondary | ICD-10-CM | POA: Diagnosis not present

## 2015-07-01 DIAGNOSIS — D509 Iron deficiency anemia, unspecified: Secondary | ICD-10-CM | POA: Diagnosis not present

## 2015-07-01 DIAGNOSIS — Z7982 Long term (current) use of aspirin: Secondary | ICD-10-CM | POA: Insufficient documentation

## 2015-07-01 DIAGNOSIS — J449 Chronic obstructive pulmonary disease, unspecified: Secondary | ICD-10-CM | POA: Diagnosis not present

## 2015-07-01 DIAGNOSIS — F17210 Nicotine dependence, cigarettes, uncomplicated: Secondary | ICD-10-CM | POA: Insufficient documentation

## 2015-07-01 DIAGNOSIS — D689 Coagulation defect, unspecified: Secondary | ICD-10-CM | POA: Diagnosis not present

## 2015-07-01 LAB — NM MYOCAR MULTI W/SPECT W/WALL MOTION / EF
LV dias vol: 233 mL
LVSYSVOL: 144 mL
NUC STRESS TID: 1.01
SDS: 3
SRS: 19
SSS: 12

## 2015-07-01 LAB — TROPONIN I: Troponin I: <0.03 ng/mL (ref <0.031)

## 2015-07-01 LAB — BASIC METABOLIC PANEL
ANION GAP: 10 (ref 5–15)
BUN: 37 mg/dL — ABNORMAL HIGH (ref 6–20)
CO2: 30 mmol/L (ref 22–32)
Calcium: 9.3 mg/dL (ref 8.9–10.3)
Chloride: 102 mmol/L (ref 101–111)
Creatinine, Ser: 4.6 mg/dL — ABNORMAL HIGH (ref 0.61–1.24)
GFR calc Af Amer: 13 mL/min — ABNORMAL LOW (ref >60)
GFR, EST NON AFRICAN AMERICAN: 11 mL/min — AB (ref >60)
Glucose, Bld: 96 mg/dL (ref 65–99)
POTASSIUM: 4.7 mmol/L (ref 3.5–5.1)
Sodium: 142 mmol/L (ref 135–145)

## 2015-07-01 LAB — CBC
HCT: 36.4 % — ABNORMAL LOW (ref 40.0–52.0)
Hemoglobin: 12.2 g/dL — ABNORMAL LOW (ref 13.0–18.0)
MCH: 33.3 pg (ref 26.0–34.0)
MCHC: 33.6 g/dL (ref 32.0–36.0)
MCV: 99 fL (ref 80.0–100.0)
PLATELETS: 111 10*3/uL — AB (ref 150–440)
RBC: 3.67 MIL/uL — AB (ref 4.40–5.90)
RDW: 14.1 % (ref 11.5–14.5)
WBC: 4.5 10*3/uL (ref 3.8–10.6)

## 2015-07-01 MED ORDER — ASPIRIN EC 81 MG PO TBEC: 81.0000 mg | DELAYED_RELEASE_TABLET | Freq: Every day | ORAL | Status: DC

## 2015-07-01 MED ORDER — TECHNETIUM TC 99M SESTAMIBI - CARDIOLITE
30.0000 | Freq: Once | INTRAVENOUS | Status: AC | PRN
  Administered 2015-07-01: 30.565 via INTRAVENOUS

## 2015-07-01 MED ORDER — SODIUM CHLORIDE 0.9 % IJ SOLN: 3.0000 mL | Freq: Two times a day (BID) | INTRAMUSCULAR | Status: DC

## 2015-07-01 MED ORDER — HEPARIN SODIUM (PORCINE) 5000 UNIT/ML IJ SOLN
5000.0000 [IU] | Freq: Three times a day (TID) | INTRAMUSCULAR | Status: DC
  Administered 2015-07-01: 5000 [IU] via SUBCUTANEOUS
  Filled 2015-07-01: qty 1

## 2015-07-01 MED ORDER — ASPIRIN 81 MG PO CHEW
81.0000 mg | CHEWABLE_TABLET | Freq: Once | ORAL | Status: AC
  Administered 2015-07-01: 81 mg via ORAL
  Filled 2015-07-01: qty 1

## 2015-07-01 MED ORDER — PRAVASTATIN SODIUM 20 MG PO TABS: 10.0000 mg | ORAL_TABLET | Freq: Every day | ORAL | Status: DC

## 2015-07-01 MED ORDER — PANTOPRAZOLE SODIUM 40 MG PO TBEC: 40.0000 mg | DELAYED_RELEASE_TABLET | Freq: Every day | ORAL | Status: DC

## 2015-07-01 MED ORDER — TECHNETIUM TC 99M SESTAMIBI - CARDIOLITE
13.0000 | Freq: Once | INTRAVENOUS | Status: AC | PRN
  Administered 2015-07-01: 13.965 via INTRAVENOUS

## 2015-07-01 NOTE — Discharge Summary (Signed)
[facility] [place] NAME: Frank Huang    MR#:  [id number]  DATE OF BIRTH:  1932-02-15  DATE OF ADMISSION:  07/01/2015 ADMITTING PHYSICIAN: Loletha Grayer, MD  DATE OF DISCHARGE: 07/01/2015  PRIMARY CARE PHYSICIAN: Wilhemena Durie, MD    ADMISSION DIAGNOSIS:  Chest pain [R07.9] Chest pain, unspecified chest pain type [R07.9]  DISCHARGE DIAGNOSIS:  Active Problems:   Chest pain   SECONDARY DIAGNOSIS:   Past Medical History  Diagnosis Date  . Cancer   . Multiple myeloma   . Myocardial infarction   . Chronic kidney disease     HOSPITAL COURSE:   Please see admission from today. 1. Chest pain unspecified- stress test consistent with old scar from previous MI. If second troponin negative will be discharged home. 2. End-stage renal disease on dialysis we'll discharge straight the dialysis today. 3. Multiple myeloma 4. Gastroesophageal reflux disease without esophagitis on PPI.  DISCHARGE CONDITIONS:  Satisfactory  CONSULTS OBTAINED:  None  DRUG ALLERGIES:  No Known Allergies  DISCHARGE MEDICATIONS:   Current Discharge Medication List    CONTINUE these medications which have NOT CHANGED   Details  aspirin EC 81 MG tablet Take 81 mg by mouth daily.    lovastatin (MEVACOR) 40 MG tablet TAKE 1 TABLET BY MOUTH EVERY DAY FOR HIGH CHOLESTEROL    Multiple Vitamin (MULTI-VITAMINS) TABS Take by mouth.    omeprazole (PRILOSEC) 20 MG capsule TAKE 1 CAPSULE BY MOUTH EVERY DAY.         DISCHARGE INSTRUCTIONS:   Follow-up Dr. Rosanna Randy 1 week Dialysis today.  If you experience worsening of your admission symptoms, develop shortness of breath, life threatening emergency, suicidal or homicidal thoughts you must seek medical attention immediately by calling 911 or calling your MD immediately  if symptoms less severe.  You Must read complete instructions/literature along with all the possible adverse reactions/side  effects for all the Medicines you take and that have been prescribed to you. Take any new Medicines after you have completely understood and accept all the possible adverse reactions/side effects.   Please note  You were cared for by a hospitalist during your hospital stay. If you have any questions about your discharge medications or the care you received while you were in the hospital after you are discharged, you can call the unit and asked to speak with the hospitalist on call if the hospitalist that took care of you is not available. Once you are discharged, your primary care physician will handle any further medical issues. Please note that NO REFILLS for any discharge medications will be authorized once you are discharged, as it is imperative that you return to your primary care physician (or establish a relationship with a primary care physician if you do not have one) for your aftercare needs so that they can reassess your need for medications and monitor your lab values.    Today   CHIEF COMPLAINT:   Chief Complaint  Patient presents with  . Chest Pain    HISTORY OF PRESENT ILLNESS:  Frank Huang  is a 81 y.o. male with a known history of CAD presented with chest pain.   VITAL SIGNS:  Blood pressure 143/69, pulse 146, temperature 97.7 F (36.5 C), temperature source Oral, resp. rate 18, SpO2 93 %.  I/O:  No intake or output data in the 24 hours ending 07/01/15 1116    DATA REVIEW:   CBC  Recent Labs Lab 07/01/15  0603  WBC 4.5  HGB 12.2*  HCT 36.4*  PLT 111*    Chemistries   Recent Labs Lab 06/28/15 1039 07/01/15 0603  NA  --  142  K  --  4.7  CL  --  102  CO2  --  30  GLUCOSE  --  96  BUN  --  37*  CREATININE  --  4.60*  CALCIUM 9.4 9.3  AST 17  --   ALT 13*  --   ALKPHOS 77  --   BILITOT 0.9  --     Cardiac Enzymes  Recent Labs Lab 07/01/15 0603  TROPONINI <0.03       RADIOLOGY:  Dg Chest 1 View  07/01/2015   CLINICAL DATA:   Left-sided chest pain for 2 hours  EXAM: CHEST  1 VIEW  COMPARISON:  04/12/2015  FINDINGS: Mild cardiomegaly is stable.  Stable aortic tortuosity.  There is no edema, consolidation, effusion, or pneumothorax. No acute osseous finding.  IMPRESSION: No active disease.   Electronically Signed   By: Monte Fantasia M.D.   On: 07/01/2015 06:56   Nm Myocar Multi W/spect W/wall Motion / Ef  07/01/2015    Defect 1: There is a small defect of moderate severity present in the  apical anterior location.  Findings consistent with prior myocardial infarction.  This is a low risk study.  The left ventricular ejection fraction is moderately decreased (30-44%).     Management plans discussed with the patient, family and they are in agreement.  CODE STATUS:     Code Status Orders        Start     Ordered   07/01/15 0753  Full code   Continuous     07/01/15 0753      TOTAL TIME TAKING CARE OF THIS PATIENT: Another 15 minutes.    Loletha Grayer M.D on 07/01/2015 at 11:16 AM  Between 7am to 6pm - Pager - [phone]  After 6pm go to [email or web address] - password [email or web address]  Office  [phone]  CC: Primary care physician; Wilhemena Durie, MD

## 2015-07-01 NOTE — Progress Notes (Signed)
Patient admitted to unit. Oriented to room, call bell, and staff. Bed in lowest position. Fall safety plan reviewed. Full assessment to Epic. Skin assessment verified with Grant Fontana RN. Telemetry box verification with tele clerk- Box#: 25. Will continue to monitor.  Per Dr. Leslye Peer, if second troponin comes back negative, discharge patient. Stress test was negative.

## 2015-07-01 NOTE — Discharge Instructions (Signed)
Chest Pain Observation  It is often hard to give a specific diagnosis for the cause of chest pain. Among other possibilities your symptoms might be caused by inadequate oxygen delivery to your heart (angina). Angina that is not treated or evaluated can lead to a heart attack (myocardial infarction) or death.  Blood tests, electrocardiograms, and X-rays may have been done to help determine a possible cause of your chest pain. After evaluation and observation, your health care provider has determined that it is unlikely your pain was caused by an unstable condition that requires hospitalization. However, a full evaluation of your pain may need to be completed, with additional diagnostic testing as directed. It is very important to keep your follow-up appointments. Not keeping your follow-up appointments could result in permanent heart damage, disability, or death. If there is any problem keeping your follow-up appointments, you must call your health care provider.  HOME CARE INSTRUCTIONS   Due to the slight chance that your pain could be angina, it is important to follow your health care provider's treatment plan and also maintain a healthy lifestyle:  · Maintain or work toward achieving a healthy weight.  · Stay physically active and exercise regularly.  · Decrease your salt intake.  · Eat a balanced, healthy diet. Talk to a dietitian to learn about heart-healthy foods.  · Increase your fiber intake by including whole grains, vegetables, fruits, and nuts in your diet.  · Avoid situations that cause stress, anger, or depression.  · Take medicines as advised by your health care provider. Report any side effects to your health care provider. Do not stop medicines or adjust the dosages on your own.  · Quit smoking. Do not use nicotine patches or gum until you check with your health care provider.  · Keep your blood pressure, blood sugar, and cholesterol levels within normal limits.  · Limit alcohol intake to no more than  1 drink per day for women who are not pregnant and 2 drinks per day for men.  · Do not abuse drugs.  SEEK IMMEDIATE MEDICAL CARE IF:  You have severe chest pain or pressure which may include symptoms such as:  · You feel pain or pressure in your arms, neck, jaw, or back.  · You have severe back or abdominal pain, feel sick to your stomach (nauseous), or throw up (vomit).  · You are sweating profusely.  · You are having a fast or irregular heartbeat.  · You feel short of breath while at rest.  · You notice increasing shortness of breath during rest, sleep, or with activity.  · You have chest pain that does not get better after rest or after taking your usual medicine.  · You wake from sleep with chest pain.  · You are unable to sleep because you cannot breathe.  · You develop a frequent cough or you are coughing up blood.  · You feel dizzy, faint, or experience extreme fatigue.  · You develop severe weakness, dizziness, fainting, or chills.  Any of these symptoms may represent a serious problem that is an emergency. Do not wait to see if the symptoms will go away. Call your local emergency services (911 in the [place] Do not drive yourself to the hospital.  MAKE SURE YOU:  · Understand these instructions.  · Will watch your condition.  · Will get help right away if you are not doing well or get worse.  Document Released: 11/30/2010 Document Revised: 11/02/2013 Document Reviewed: 04/29/2013    ExitCare® Patient Information ©2015 [employer]. This information is not intended to replace advice given to you by your health care provider. Make sure you discuss any questions you have with your health care provider.

## 2015-07-01 NOTE — ED Notes (Signed)
Pt reports CP starting 2 hours ago described as pressure.  Pt reports hx MI and feels similar as previous.  Pt on dialysis, fistula left arm.  Pt NAD at this time.

## 2015-07-01 NOTE — ED Provider Notes (Signed)
[facility] Emergency Department Provider Note  ____________________________________________  Time seen: Approximately 6 AM  I have reviewed the triage vital signs and the nursing notes.   HISTORY  Chief Complaint Chest Pain    HPI Frank Huang is a 81 y.o. male with a history of CAD and chronic kidney disease on dialysis who presents today with chest pain that woke him from sleep at 4 AM today. He says that the pain was a pressure-like pain radiating to his left arm and his back. He had some associated shortness of breath. However, no nausea, vomiting or diaphoresis. Says that this pain feels similar to the pain and he felt in 2005 when he had a myocardial infarction. He has a stent. He takes a daily aspirin he has taken 381 mg aspirins prior to arrival today. He says that the pain right now is a 3 out of 4 and is not requesting any further pain medication. He sees Dr. Satira Mccallum for cardiology follow-up.As dialyzed Thursday.   Past Medical History  Diagnosis Date  . Cancer   . Multiple myeloma   . Myocardial infarction   . Chronic kidney disease     Patient Active Problem List   Diagnosis Date Noted  . Arteriosclerosis of coronary artery 04/09/2015  . CAFL (chronic airflow limitation) 04/09/2015  . Chronic kidney disease requiring chronic dialysis 04/09/2015  . Gastro-esophageal reflux disease without esophagitis 04/09/2015  . Gout 04/09/2015  . H/O acute myocardial infarction 04/09/2015  . HLD (hyperlipidemia) 04/09/2015  . BP (high blood pressure) 04/09/2015  . Bad memory 04/09/2015  . 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 Kidney failure 04/09/2015  . Chronic kidney disease (CKD), stage V 07/20/2013  . Absolute anemia 03/31/2013  . Neuropathy 03/31/2013    Past Surgical History  Procedure Laterality Date  . Total hip arthroplasty Left   . Appendectomy    . Cholecystectomy      Current Outpatient Rx  Name  Route   Sig  Dispense  Refill  . aspirin EC 81 MG tablet   Oral   Take 81 mg by mouth daily.         Marland Kitchen lovastatin (MEVACOR) 40 MG tablet      TAKE 1 TABLET BY MOUTH EVERY DAY FOR HIGH CHOLESTEROL         . Multiple Vitamin (MULTI-VITAMINS) TABS   Oral   Take by mouth.         Marland Kitchen omeprazole (PRILOSEC) 20 MG capsule      TAKE 1 CAPSULE BY MOUTH EVERY DAY.           Allergies Review of patient's allergies indicates no known allergies.  Family History  Problem Relation Age of Onset  . Alzheimer's disease Mother   . Kidney failure Father   . Bone cancer Sister   . Stomach cancer Sister     Social History Social History  Substance Use Topics  . Smoking status: Current Every Day Smoker -- 0.50 packs/day    Types: Cigarettes  . Smokeless tobacco: None     Comment: smokes about 3 cigarettes per week  . Alcohol Use: No    Review of Systems Constitutional: No fever/chills Eyes: No visual changes. ENT: No sore throat. Cardiovascular: As above  Respiratory: As above Gastrointestinal: No abdominal pain.  No nausea, no vomiting.  No diarrhea.  No constipation. Genitourinary: Negative for dysuria. Musculoskeletal: Negative for back pain. Skin: Negative for rash. Neurological: Negative for headaches,  focal weakness or numbness.  10-point ROS otherwise negative.  ____________________________________________   PHYSICAL EXAM:  VITAL SIGNS: ED Triage Vitals  Enc Vitals Group     BP 07/01/15 0551 126/78 mmHg     Pulse Rate 07/01/15 0551 73     Resp 07/01/15 0551 21     Temp 07/01/15 0551 97.6 F (36.4 C)     Temp Source 07/01/15 0551 Oral     SpO2 07/01/15 0551 96 %     Weight --      Height --      Head Cir --      Peak Flow --      Pain Score 07/01/15 0555 6     Pain Loc --      Pain Edu? --      Excl. in [place]? --     Constitutional: Alert and oriented. Well appearing and in no acute distress. Eyes: Conjunctivae are normal. PERRL. EOMI. Head:  Atraumatic. Nose: No congestion/rhinnorhea. Mouth/Throat: Mucous membranes are moist.  Oropharynx non-erythematous. Neck: No stridor.   Cardiovascular: Normal rate, regular rhythm. Grossly normal heart sounds.  Good peripheral circulation. Respiratory: Normal respiratory effort.  No retractions. Lungs CTAB. Gastrointestinal: Soft and nontender. No distention. No abdominal bruits. No CVA tenderness. Musculoskeletal: No lower extremity tenderness nor edema.  No joint effusions. Left sided upper extremity fistula with palpable thrill Neurologic:  Normal speech and language. No gross focal neurologic deficits are appreciated. No gait instability. Skin:  Multiple nevi on the torso.  Psychiatric: Mood and affect are normal. Speech and behavior are normal.  ____________________________________________   LABS (all labs ordered are listed, but only abnormal results are displayed)  Labs Reviewed  BASIC METABOLIC PANEL - Abnormal; Notable for the following:    BUN 37 (*)    Creatinine, Ser 4.60 (*)    GFR calc non Af Amer 11 (*)    GFR calc Af Amer 13 (*)    All other components within normal limits  CBC - Abnormal; Notable for the following:    RBC 3.67 (*)    Hemoglobin 12.2 (*)    HCT 36.4 (*)    Platelets 111 (*)    All other components within normal limits  TROPONIN I   ____________________________________________  EKG  ED ECG REPORT I, Doran Stabler, the attending physician, personally viewed and interpreted this ECG.   Date: 07/01/2015  EKG Time: 545  Rate: 77  Rhythm: normal sinus rhythm  Axis: Left axis deviation  Intervals:Short PR  ST&T Change: T-wave inversion in aVL. Otherwise no ST elevations or depressions. No change from EKG on 09/29/2014. ____________________________________________  RADIOLOGY  No active disease on the chest x-ray. I personally reviewed this  image. ____________________________________________   PROCEDURES    ____________________________________________   INITIAL IMPRESSION / ASSESSMENT AND PLAN / ED COURSE  Pertinent labs & imaging results that were available during my care of the patient were reviewed by me and considered in my medical decision making (see chart for details).  ----------------------------------------- 7:14 AM on 07/01/2015 -----------------------------------------  Patient resting comfortably at this time. Patient sleep in the room when I went to reevaluate him. Pain-free at this time. We'll admit the patient for further cardiac evaluation. High-risk features including previous CAD and pain feeling similar to previous MI. Signed out to Dr. Reece Levy. ____________________________________________   FINAL CLINICAL IMPRESSION(S) / ED DIAGNOSES  Acute chest pain. Initial visit.    Orbie Pyo, MD 07/01/15 [phone]

## 2015-07-01 NOTE — H&P (Signed)
Frank Huang at [facility] NAME: Frank Huang    MR#:  [id number]  DATE OF BIRTH:  1932-04-01  DATE OF ADMISSION:  07/01/2015  PRIMARY CARE PHYSICIAN: Wilhemena Durie, MD   REQUESTING/REFERRING PHYSICIAN: Dr. Suezanne Jacquet  CHIEF COMPLAINT:   Chief Complaint  Patient presents with  . Chest Pain    HISTORY OF PRESENT ILLNESS:  Frank Huang  is a 81 y.o. male with a known history of CAD, end-stage renal disease, multiple myeloma, COPD, gastroesophageal reflux disease. He was awakened this morning at 4 AM with chest pain. It was described as sharp pressure-like 8 out of 10 intensity. It lasted for 2 hours until he got to the emergency room. Now he is chest pain-free. The pain came and went for those couple hours. No diaphoresis. Positive for nausea but no vomiting. No shortness of breath. Nothing made the pain better or worse. He does have a history of coronary artery disease with a stent back in 2005. No further stress testing or cardiac catheter since then. Hospitalist services were contacted for further evaluation.  PAST MEDICAL HISTORY:   Past Medical History  Diagnosis Date  . Cancer   . Multiple myeloma   . Myocardial infarction   . Chronic kidney disease     PAST SURGICAL HISTORY:   Past Surgical History  Procedure Laterality Date  . Total hip arthroplasty Left   . Appendectomy    . Cholecystectomy      SOCIAL HISTORY:   Social History  Substance Use Topics  . Smoking status: Current Every Day Smoker -- 0.50 packs/day    Types: Cigarettes  . Smokeless tobacco: Not on file     Comment: smokes about 3 cigarettes per week  . Alcohol Use: No    FAMILY HISTORY:   Family History  Problem Relation Age of Onset  . Alzheimer's disease Mother   . Kidney failure Father   . Bone cancer Sister   . Stomach cancer Sister     DRUG ALLERGIES:  No Known Allergies  REVIEW OF SYSTEMS:  CONSTITUTIONAL: No fever, chills or  sweats. He gets fatigued easily. EYES: No blurred or double vision. Wears glasses. EARS, NOSE, AND THROAT: No tinnitus or ear pain. No sore throat. Decreased hearing. Positive for postnasal drip. RESPIRATORY: No cough, shortness of breath, wheezing or hemoptysis.  CARDIOVASCULAR: Positive for chest pain, no orthopnea, edema.  GASTROINTESTINAL: Some nausea, no vomiting, diarrhea or abdominal pain. No blood in bowel movements. GENITOURINARY: No dysuria, hematuria.  ENDOCRINE: No polyuria, nocturia,  HEMATOLOGY: No anemia, easy bruising or bleeding SKIN: No rash or lesion. MUSCULOSKELETAL: No joint pain or arthritis.   NEUROLOGIC: No tingling, numbness, weakness.  PSYCHIATRY: No anxiety or depression.   MEDICATIONS AT HOME:   Prior to Admission medications   Medication Sig Start Date End Date Taking? Authorizing Provider  aspirin EC 81 MG tablet Take 81 mg by mouth daily.   Yes Historical Provider, MD  lovastatin (MEVACOR) 40 MG tablet TAKE 1 TABLET BY MOUTH EVERY DAY FOR HIGH CHOLESTEROL 08/10/14  Yes Historical Provider, MD  Multiple Vitamin (MULTI-VITAMINS) TABS Take by mouth.   Yes Historical Provider, MD  omeprazole (PRILOSEC) 20 MG capsule TAKE 1 CAPSULE BY MOUTH EVERY DAY. 11/15/14  Yes Historical Provider, MD      VITAL SIGNS:  Blood pressure 95/79, pulse 68, temperature 97.6 F (36.4 C), temperature source Oral, resp. rate 21, SpO2 94 %.  PHYSICAL EXAMINATION:  GENERAL:  81 y.o.-year-old patient lying in the bed with no acute distress.  EYES: Pupils equal, round, reactive to light and accommodation. No scleral icterus. Extraocular muscles intact.  HEENT: Head atraumatic, normocephalic. Oropharynx and nasopharynx clear. Tympanic membrane blocked by wax. NECK:  Supple, no jugular venous distention. No thyroid enlargement, no tenderness.  LUNGS: Slight wheeze right lung, no rales,rhonchi or crepitation. No use of accessory muscles of respiration.  CARDIOVASCULAR: S1, S2 normal. No  murmurs, rubs, or gallops.  ABDOMEN: Soft, nontender, nondistended. Bowel sounds present. No organomegaly or mass.  EXTREMITIES: No pedal edema, cyanosis, or clubbing.  NEUROLOGIC: Cranial nerves II through XII are intact. Muscle strength 5/5 in all extremities. Sensation intact. Gait not checked.  PSYCHIATRIC: The patient is alert and oriented x 3.  SKIN: No rash, lesion, or ulcer.   LABORATORY PANEL:   CBC  Recent Labs Lab 07/01/15 0603  WBC 4.5  HGB 12.2*  HCT 36.4*  PLT 111*   ------------------------------------------------------------------------------------------------------------------  Chemistries   Recent Labs Lab 06/28/15 1039 07/01/15 0603  NA  --  142  K  --  4.7  CL  --  102  CO2  --  30  GLUCOSE  --  96  BUN  --  37*  CREATININE  --  4.60*  CALCIUM 9.4 9.3  AST 17  --   ALT 13*  --   ALKPHOS 77  --   BILITOT 0.9  --    ------------------------------------------------------------------------------------------------------------------  Cardiac Enzymes  Recent Labs Lab 07/01/15 0603  TROPONINI <0.03   ------------------------------------------------------------------------------------------------------------------  RADIOLOGY:  Dg Chest 1 View  07/01/2015   CLINICAL DATA:  Left-sided chest pain for 2 hours  EXAM: CHEST  1 VIEW  COMPARISON:  04/12/2015  FINDINGS: Mild cardiomegaly is stable.  Stable aortic tortuosity.  There is no edema, consolidation, effusion, or pneumothorax. No acute osseous finding.  IMPRESSION: No active disease.   Electronically Signed   By: Monte Fantasia M.D.   On: 07/01/2015 06:56    EKG:   Normal sinus rhythm 77 bpm. Incomplete left bundle branch block left axis deviation.  IMPRESSION AND PLAN:   1. Chest pain with history of coronary artery disease. Will admit initially as an observation. I will get a stress test on 1 enzyme. Case discussed with this stress testing and Dr. Lorinda Creed cardiologist who will read the  stress test. Check enzyme at 10 AM. If stress test is negative will discharge today to dialysis. Patient given aspirin already. No beta blocker with hypotension. 2. End-stage renal disease on dialysis at [facility] Tuesday Thursday and Saturday. If stress test is negative will discharge to dialysis today. 3. Gastroesophageal reflux disease- continue PPI 4. COPD with wheeze on the right lung, tobacco abuse- smoking cessation counseling done 3 minutes by me. 5. History of multiple myeloma 6. Hyperlipidemia- continue lovastatin. Add on lipid profile.  All the records are reviewed and case discussed with ED provider. Management plans discussed with the patient, family and they are in agreement. Also spoke with nephrologist and cardiologist and nuclear medicine personnel.  CODE STATUS: Full code  TOTAL TIME TAKING CARE OF THIS PATIENT: 60 minutes.    Loletha Grayer M.D on 07/01/2015 at 8:04 AM  Between 7am to 6pm - Pager - [phone]  After 6pm call admission pager [facility]  Office  [phone]  CC: Primary care physician; Wilhemena Durie, MD

## 2015-07-01 NOTE — Progress Notes (Signed)
Patient given discharge teaching and paperwork regarding medications, diet, follow-up appointments and activity. Patient understanding verbalized. No complaints at this time. IV and telemetry discontinued prior to leaving. Skin assessment as previously charted and vitals are stable; on room air. Patient being discharged to home. Wife present during discharge teaching. Patient to go to regular dialysis center today.  Toniann Ket

## 2015-07-04 DIAGNOSIS — D509 Iron deficiency anemia, unspecified: Secondary | ICD-10-CM | POA: Diagnosis not present

## 2015-07-04 DIAGNOSIS — D689 Coagulation defect, unspecified: Secondary | ICD-10-CM | POA: Diagnosis not present

## 2015-07-04 DIAGNOSIS — N186 End stage renal disease: Secondary | ICD-10-CM | POA: Diagnosis not present

## 2015-07-04 DIAGNOSIS — N2581 Secondary hyperparathyroidism of renal origin: Secondary | ICD-10-CM | POA: Diagnosis not present

## 2015-07-05 ENCOUNTER — Inpatient Hospital Stay (HOSPITAL_BASED_OUTPATIENT_CLINIC_OR_DEPARTMENT_OTHER): Payer: Medicare Other | Admitting: Hematology and Oncology

## 2015-07-05 ENCOUNTER — Inpatient Hospital Stay: Payer: Medicare Other

## 2015-07-05 ENCOUNTER — Encounter: Payer: Self-pay | Admitting: Hematology and Oncology

## 2015-07-05 VITALS — BP 137/60 | HR 73 | Temp 97.0°F | Resp 20 | Wt 197.3 lb

## 2015-07-05 DIAGNOSIS — Z992 Dependence on renal dialysis: Secondary | ICD-10-CM | POA: Diagnosis not present

## 2015-07-05 DIAGNOSIS — D649 Anemia, unspecified: Secondary | ICD-10-CM

## 2015-07-05 DIAGNOSIS — M19019 Primary osteoarthritis, unspecified shoulder: Secondary | ICD-10-CM | POA: Diagnosis not present

## 2015-07-05 DIAGNOSIS — I252 Old myocardial infarction: Secondary | ICD-10-CM | POA: Diagnosis not present

## 2015-07-05 DIAGNOSIS — Z79899 Other long term (current) drug therapy: Secondary | ICD-10-CM

## 2015-07-05 DIAGNOSIS — C9000 Multiple myeloma not having achieved remission: Secondary | ICD-10-CM

## 2015-07-05 DIAGNOSIS — N186 End stage renal disease: Secondary | ICD-10-CM

## 2015-07-05 DIAGNOSIS — Z7982 Long term (current) use of aspirin: Secondary | ICD-10-CM | POA: Diagnosis not present

## 2015-07-05 DIAGNOSIS — E538 Deficiency of other specified B group vitamins: Secondary | ICD-10-CM | POA: Diagnosis not present

## 2015-07-05 DIAGNOSIS — Z800 Family history of malignant neoplasm of digestive organs: Secondary | ICD-10-CM | POA: Diagnosis not present

## 2015-07-05 LAB — IRON AND TIBC
Iron: 81 ug/dL (ref 45–182)
Saturation Ratios: 30 % (ref 17.9–39.5)
TIBC: 269 ug/dL (ref 250–450)
UIBC: 188 ug/dL

## 2015-07-05 LAB — VITAMIN B12: Vitamin B-12: 120 pg/mL — ABNORMAL LOW (ref 180–914)

## 2015-07-05 LAB — FERRITIN: Ferritin: 1121 ng/mL — ABNORMAL HIGH (ref 24–336)

## 2015-07-05 LAB — FOLATE: Folate: 10.8 ng/mL (ref >5.9)

## 2015-07-05 NOTE — Progress Notes (Signed)
[facility] day:  07/05/2015  Chief Complaint: Frank Huang is a 81 y.o. male with multiple myeloma who is seen for 3 month assessment.  HPI:  The patient was last seen in the medical oncology clinic on 04/12/2015.  At that time, he was seen for initial visit by me.  He was noted to have smoldering myeloma.  He felt well.  He denied any bone pain.  He had no issues with infections.  CBC included a hematocrit 36.4, hemoglobin 12.4, platelets 128,000, and white count 4300. Serum protein electrophoresis revealed 0.5 g/dL IgA monoclonal protein with kappa light chain specificity.  Kappa free light chains were 1409 with lambda free light chains 17.5 and a ratio of 80.51.  Symptomatically, he voices no new complaints.  He denies any bone pain.  He denies any interval infections.  He continue on dialysis.  His dialysis days have been switched and will be switched again to MWF.  He has had some issues with his shoulder.  MRI on 06/14/2015 revealed supraspinatus and infraspinatus tendinopathy, moderate acromioclavicular arthritis, and adhesive capsulitis.    Past Medical History  Diagnosis Date  . Cancer   . Multiple myeloma   . Myocardial infarction   . Chronic kidney disease     Past Surgical History  Procedure Laterality Date  . Total hip arthroplasty Left   . Appendectomy    . Cholecystectomy      Family History  Problem Relation Age of Onset  . Alzheimer's disease Mother   . Kidney failure Father   . Bone cancer Sister   . Stomach cancer Sister     Social History:  reports that he has been smoking Cigarettes.  He has been smoking about 0.50 packs per day. He does not have any smokeless tobacco history on file. He reports that he does not drink alcohol or use illicit drugs.  The patient is accompanied by his wife today.  Allergies: No Known Allergies  Current Medications: Current Outpatient Prescriptions  Medication Sig Dispense Refill  .  aspirin EC 81 MG tablet Take 81 mg by mouth daily.    Marland Kitchen lovastatin (MEVACOR) 40 MG tablet TAKE 1 TABLET BY MOUTH EVERY DAY FOR HIGH CHOLESTEROL    . Multiple Vitamin (MULTI-VITAMINS) TABS Take by mouth.    Marland Kitchen omeprazole (PRILOSEC) 20 MG capsule TAKE 1 CAPSULE BY MOUTH EVERY DAY.     No current facility-administered medications for this visit.    Review of Systems:  GENERAL:  Feels ok.  No fevers, sweats or weight loss. PERFORMANCE STATUS (ECOG):  1 HEENT:  No visual changes, runny nose, sore throat, mouth sores or tenderness. Lungs: No shortness of breath or cough.  No hemoptysis. Cardiac:  No chest pain, palpitations, orthopnea, or PND. GI:  No nausea, vomiting, diarrhea, constipation, melena or hematochezia. GU:  On dialysis.  No urgency, frequency, dysuria, or hematuria. Musculoskeletal:  Left hip discomfort s/p replacement.  Shoulder issue (see HPI).  No back pain.  No muscle tenderness. Extremities:  No pain or swelling. Skin:  No rashes or skin changes. Neuro:  Left foot numbness. No headache,weakness, balance or coordination issues. Endocrine:  No diabetes, thyroid issues, hot flashes or night sweats. Psych:  No mood changes, depression or anxiety. Pain:  No focal pain. Review of systems:  All other systems reviewed and found to be negative.  Physical Exam: Blood pressure 137/60, pulse 73, temperature 97 F (36.1 C), temperature source Tympanic, resp. rate  20, weight 197 lb 5 oz (89.5 kg), SpO2 99 %. GENERAL:  Well developed, well nourished, sitting comfortably in the exam room in no acute distress. MENTAL STATUS:  Alert and oriented to person, place and time. HEAD:  Pearline Cables hair.  Normocephalic, atraumatic, face symmetric, no Cushingoid features. EYES:  Glasses.  Pupils equal round and reactive to light and accomodation.  No conjunctivitis or scleral icterus. ENT:  Oropharynx clear without lesion.  Tongue normal. Mucous membranes moist.  RESPIRATORY:  Clear to auscultation  without rales, wheezes or rhonchi. CARDIOVASCULAR:  Regular rate and rhythm without murmur, rub or gallop. ABDOMEN:  Soft, non-tender, with active bowel sounds, and no hepatosplenomegaly.  No masses. SKIN:  No rashes, ulcers or lesions. EXTREMITIES: No edema, no skin discoloration or tenderness.  No palpable cords. LYMPH NODES: No palpable cervical, supraclavicular, axillary or inguinal adenopathy  NEUROLOGICAL: Unremarkable. PSYCH:  Appropriate.  Office Visit on 07/05/2015  Component Date Value Ref Range Status  . Folate 07/05/2015 10.8  >5.9 ng/mL Final  . Vitamin B-12 07/05/2015 120* 180 - 914 pg/mL Final   Comment: (NOTE) This assay is not validated for testing neonatal or myeloproliferative syndrome specimens for Vitamin B12 levels. Performed at [facility]   . Iron 07/05/2015 81  45 - 182 ug/dL Final  . TIBC 07/05/2015 269  250 - 450 ug/dL Final  . Saturation Ratios 07/05/2015 30  17.9 - 39.5 % Final  . UIBC 07/05/2015 188   Final  . Ferritin 07/05/2015 1121* 24 - 336 ng/mL Final    Assessment:  Frank Huang is a 81 y.o. 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  Bone marrow revealed 20% plasma cells. He was treated with thalidomide for 7 months then discontinued in 06/2005 secondary to rash and depression. He was treated briefly with Revlimid in 2013 which was also discontinued secondary to rash.  M spike was 0.5 gm/dL on 06/15/2013, 0.8 gm/dL on 01/19/2014, 0.5 gm/dL on 11/22/2014, and 0.9 on 06/28/2015.  Kappa free light chains were 512 in 06/15/2013, 686 11/02/2013, 929 on 01/19/2014, 1212 in 05/10/2014, 1409 on 11/22/2014, 1119 on 02/28/2015, and 851 on 06/28/2015.  He has subsequently been followed off therapy.  He had a hip fracture in in 12/2013.  Per notes, pathology revealed no myeloma.  Bone survey on 01/17/2015 revealed a 9 mm lytic lesion in the frontoparietal region (previously 7 mm).   He has end-stage  renal disease unrelated to his myeloma.  He has been on dialysis since 11/2013.   He has a history of iron deficiency. He had a negative colonoscopy and upper endoscopy in 08/2004. Notes indicate he also had a small bowel capsule study. EGD and colonoscopy in 2014 revealed gastric lesions which were cauterized. He receives IV iron and Procrit with dialysis.  Ferritin is elevated (? acute phase reactant)  Symptomatically, he feels well.  He denies bone pain.  He has had issues with his left shoulder.  MRI on 06/14/2015 revealed a tendinopathy, osteoarthritis, and capsulitis.  He has had no infections.  Plan: 1. Review labs from last week. 2. Re-discuss renal insufficiency.  Will review etiology with nephrology secondary to free light chains. 3. RTC 1 week prior to next appointment for labs (CBC with diff, CMP, SPEP, free light chains, ferritin, ESR). 4. RTC in 3 months for MD assessment in [place] and review of labs.   Addendum:  B12 deficiency documented.  Preauth B12.    Melissa  Elmarie Mainland, MD  07/05/2015

## 2015-07-05 NOTE — Progress Notes (Signed)
Pt here for F/U visit for MM. Denies any pain for MM. Has been to his PCP and had an MRI of L shoulder. It showed arthritis according to patients wife. Pt has numbness in L foot for 6 months. Getting appt with Dr Elvina Mattes. Changing dialysis days back to [facility]. Has fatigue. Naps 4x daily. Sleeps good at night.B/B normal. Voids regularly. Skin is bruised and frail.

## 2015-07-05 NOTE — Progress Notes (Signed)
Frank Huang  Chief Complaint: Frank Huang is a 81 y.o. male with multiple myeloma who is seen for reassessment.  HPI:  The patient was diagnosed with multiple myeloma in 2005.  Initial notes indicated a 1.2 gm/dL IgA monoclonal gammopathy.  Bone marrow revealed 20% plasma cells. He was treated with thalidomide for 7 months then discontinued in 06/2005 secondary to rash and depression. He was treated briefly with Revlimid in 2013  which was also discontinued secondary to rash.  Light chains were in the 500 range in 08/2013 and 1200 in 04/2014.  He has subsequently been followed off therapy.  He had a hip fracture in in 12/2013.  Per notes, pathology revealed no myeloma.  The patient was diagnosed with end-stage renal disease in 11/2013. He undergoes dialysis on a Monday Wednesday Friday basis. Notes by Dr. Inez Pilgrim indicate that his renal disease is not clearly related to his myeloma.  Patient has a history of iron deficiency. He had a negative colonoscopy and upper endoscopy in 08/2004. Notes indicate he also had a small bowel capsule study. EGD and colonoscopy in 2014 revealed gastric lesions which were cauterized. He states that he receives IV iron once a month with dialysis as well as Procrit.  Skeletal survey was negative on 11/26/2011 and 01/19/2014. Skeletal survey in 06/2014 revealed one small parietal bone lesion.  Bone survey on 01/17/2015 revealed a 9 mm lytic lesion in the frontoparietal region (previously 7 mm).  He denies any bone pain.  The patient states that he feels fine. He states "I don't have it".  He notes a bit of discomfort in his left hip status post replacement.  Past Medical History  Diagnosis Date  . Cancer   . Multiple myeloma   . Myocardial infarction   . Chronic kidney disease     Past Surgical History  Procedure Laterality Date  . Total hip arthroplasty Left   . Appendectomy    . Cholecystectomy       Family History  Problem Relation Age of Onset  . Alzheimer's disease Mother   . Kidney failure Father   . Bone cancer Sister   . Stomach cancer Sister     Social History:  reports that he has been smoking Cigarettes.  He has been smoking about 0.50 packs per day. He does not have any smokeless tobacco history on file. He reports that he does not drink alcohol or use illicit drugs.  The patient is accompanied by his wife today.  Allergies: No Known Allergies  Current Medications: Current Outpatient Prescriptions  Medication Sig Dispense Refill  . lovastatin (MEVACOR) 40 MG tablet TAKE 1 TABLET BY MOUTH EVERY DAY FOR HIGH CHOLESTEROL    . Multiple Vitamin (MULTI-VITAMINS) TABS Take by mouth.    Marland Kitchen omeprazole (PRILOSEC) 20 MG capsule TAKE 1 CAPSULE BY MOUTH EVERY DAY.    Marland Kitchen aspirin EC 81 MG tablet Take 81 mg by mouth daily.     No current facility-administered medications for this visit.    Review of Systems:  GENERAL:  Feelsfine.  No fevers, sweats or weight loss. PERFORMANCE STATUS (ECOG):  1 HEENT:  No visual changes, runny nose, sore throat, mouth sores or tenderness. Lungs: No shortness of breath or cough.  No hemoptysis. Cardiac:  No chest pain, palpitations, orthopnea, or PND. GI:  No nausea, vomiting, diarrhea, constipation, melena or hematochezia. GU:  On dialysis.  No urgency, frequency, dysuria, or hematuria. Musculoskeletal:  Left his discomfort s/p replacement.  No back pain.  No muscle tenderness. Extremities:  No pain or swelling. Skin:  No rashes or skin changes. Neuro:  No headache, numbness or weakness, balance or coordination issues. Endocrine:  No diabetes, thyroid issues, hot flashes or night sweats. Psych:  No mood changes, depression or anxiety. Pain:  No focal pain. Review of systems:  All other systems reviewed and found to be negative.  Physical Exam: Blood pressure 114/58, pulse 76, temperature 96 F (35.6 C), temperature source Tympanic, weight  201 lb 8 oz (91.4 kg), SpO2 99 %. GENERAL:  Well developed, well nourished, sitting comfortably in the exam room in no acute distress. MENTAL STATUS:  Alert and oriented to person, place and time. HEAD:  Pearline Cables hair.  Normocephalic, atraumatic, face symmetric, no Cushingoid features. EYES:  Glasses.  Pupils equal round and reactive to light and accomodation.  No conjunctivitis or scleral icterus. ENT:  Oropharynx clear without lesion.  Tongue normal. Mucous membranes moist.  RESPIRATORY:  Clear to auscultation without rales, wheezes or rhonchi. CARDIOVASCULAR:  Regular rate and rhythm without murmur, rub or gallop. ABDOMEN:  Soft, non-tender, with active bowel sounds, and no hepatosplenomegaly.  No masses. SKIN:  No rashes, ulcers or lesions. EXTREMITIES: No edema, no skin discoloration or tenderness.  No palpable cords. LYMPH NODES: No palpable cervical, supraclavicular, axillary or inguinal adenopathy  NEUROLOGICAL: Unremarkable. PSYCH:  Appropriate.  Appointment on Huang  Component Date Value Ref Range Status  . WBC Huang 4.3  3.8 - 10.6 K/uL Final  . RBC Huang 3.78* 4.40 - 5.90 MIL/uL Final  . Hemoglobin Huang 12.4* 13.0 - 18.0 g/dL Final  . HCT Huang 36.4* 40.0 - 52.0 % Final  . MCV Huang 96.3  80.0 - 100.0 fL Final  . MCH Huang 32.6  26.0 - 34.0 pg Final  . MCHC Huang 33.9  32.0 - 36.0 g/dL Final  . RDW Huang 14.3  11.5 - 14.5 % Final  . Platelets Huang 128* 150 - 440 K/uL Final  . Total Protein ELP Huang 7.0  6.0 - 8.5 g/dL Final  . Albumin ELP Huang 4.0  3.2 - 5.6 g/dL Final   Comment: (NOTE) **Effective April 24, 2015 the reference interval for**  Albumin will be changing to:                        0 - 30 days         Not Estab.                           >30 days         2.9 - 4.4   . Alpha-1-Globulin Huang 0.2  0.1 - 0.4 g/dL Final   Comment: (NOTE) **Effective April 24, 2015 the reference interval for**   Alpha-1-Globulin will be changing to:                        0 - 30 days         Not Estab.                           >30 days         0.0 - 0.4   . Alpha-2-Globulin Huang 0.7  0.4 - 1.2 g/dL Final   Comment: (NOTE) **Effective April 24, 2015 the reference interval for**  Alpha-2-Globulin  will be changing to:                        0 - 30 days         Not Estab.                           >30 days         0.4 - 1.0   . Beta Globulin Huang 1.7* 0.6 - 1.3 g/dL Final   Comment: (NOTE) **Effective April 24, 2015 the reference interval for**  Beta Globulin will be changing to:                        0 - 30 days         Not Estab.                        1 -  6 months       0.5 - 1.3                            >6 months       0.7 - 1.3   . Gamma Globulin Huang 0.4* 0.5 - 1.6 g/dL Final   Comment: (NOTE) **Effective April 24, 2015 the reference interval for**  Gamma Globulin will be changing to:                        0 - 30 days         Not Estab.                        1 -  6 months       0.3 - 1.6                 7 months -  5 years        0.4 - 1.3                        6 - 17 years        0.6 - 1.5                           >17 years        0.4 - 1.8   . M-Spike, % Huang 0.8* Not Observed g/dL Final  . SPE Interp. Huang Comment   Final   Comment: (NOTE) The SPE pattern demonstrates a single peak (M-spike) in the beta region which may represent monoclonal protein. This peak may also be caused by the presence of fibrinogen or circulating immune complexes. If clinically indicated, the presence of a monoclonal gammopathy may be confirmed by immunofixation, as well as evaluation of the urine for the presence of Bence-Jones protein. Performed At: [facility] [place], [place] [id number] Lindon Romp MD BW:[id number]   . Comment Huang Comment   Final   Comment: (NOTE) Protein electrophoresis scan will follow via computer, mail,  or courier delivery.   Marland Kitchen GLOBULIN, TOTAL Huang 3.0  2.0 - 4.5 g/dL Corrected   Comment: (NOTE) **Effective April 24, 2015 the reference interval for**  Globulin, Total will be changing to:  0 - 30 days         Not Estab.                           >30 days         2.2 - 3.9   . A/G Ratio Huang 1.3  0.7 - 2.0 Corrected   Comment: (NOTE) **Effective April 24, 2015 the reference interval for**  A/G Ratio will be changing to:                        0 - 30 days         Not Estab.                           >30 days         0.7 - 1.7   . Kappa free light chain Huang 955.69* 3.30 - 19.40 mg/L Final  . Lamda free light chains Huang 15.22  5.71 - 26.30 mg/L Final  . Kappa, lamda light chain ratio Huang 62.79* 0.26 - 1.65 Final   Comment: (NOTE) Performed At: [facility] [address] [place], [place] [id number] Lindon Romp MD PP:[id number]     Assessment:  ABAYOMI PATTISON is a 81 y.o. 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  Bone marrow revealed 20% plasma cells. He was treated with thalidomide for 7 months then discontinued in 06/2005 secondary to rash and depression. He was treated briefly with Revlimid in 2013 which was also discontinued secondary to rash.  Light chains were in the 500 range in 08/2013 and 1200 in 04/2014.  He has subsequently been followed off therapy.  He had a hip fracture in in 12/2013.  Per notes, pathology revealed no myeloma.  Bone survey on 01/17/2015 revealed a 9 mm lytic lesion in the frontoparietal region (previously 7 mm).   He has end-stage renal disease unrelated to his myeloma.  He has been on dialysis since 11/2013.   He has a history of iron deficiency. He had a negative colonoscopy and upper endoscopy in 08/2004. Notes indicate he also had a small bowel capsule study. EGD and colonoscopy in 2014 revealed gastric lesions which were cauterized. He  receives IV iron and Procrit with dialysis.  Symptomatically, he feels well.  He denies any bone pain.  He has had no infections.  Plan: 1. Review entire medical history, diagnosis of myeloma and treatment to date. 2. Discuss renal insufficiency, apparently unrelated to myeloma.  Discuss with nephrology (Dr. Rosalio Loud). 3. Obtain results of last bone survey- done. 4. Labs today:  CBC with diff, SPEP, free light chains. 5. Add ferritin and iron studies with next lab draw. 6. Discuss smoking cessation. 7. RTC for MD assessment in [place] on Thursday (Dr Ma Hillock) in 2 weeks secondary to MWF dialysis schedule.    Lequita Asal, MD  04/12/2015

## 2015-07-06 DIAGNOSIS — N2581 Secondary hyperparathyroidism of renal origin: Secondary | ICD-10-CM | POA: Diagnosis not present

## 2015-07-06 DIAGNOSIS — D689 Coagulation defect, unspecified: Secondary | ICD-10-CM | POA: Diagnosis not present

## 2015-07-06 DIAGNOSIS — D509 Iron deficiency anemia, unspecified: Secondary | ICD-10-CM | POA: Diagnosis not present

## 2015-07-06 DIAGNOSIS — N186 End stage renal disease: Secondary | ICD-10-CM | POA: Diagnosis not present

## 2015-07-08 DIAGNOSIS — N186 End stage renal disease: Secondary | ICD-10-CM | POA: Diagnosis not present

## 2015-07-08 DIAGNOSIS — D689 Coagulation defect, unspecified: Secondary | ICD-10-CM | POA: Diagnosis not present

## 2015-07-08 DIAGNOSIS — D509 Iron deficiency anemia, unspecified: Secondary | ICD-10-CM | POA: Diagnosis not present

## 2015-07-08 DIAGNOSIS — N2581 Secondary hyperparathyroidism of renal origin: Secondary | ICD-10-CM | POA: Diagnosis not present

## 2015-07-11 DIAGNOSIS — N186 End stage renal disease: Secondary | ICD-10-CM | POA: Diagnosis not present

## 2015-07-11 DIAGNOSIS — D509 Iron deficiency anemia, unspecified: Secondary | ICD-10-CM | POA: Diagnosis not present

## 2015-07-11 DIAGNOSIS — N2581 Secondary hyperparathyroidism of renal origin: Secondary | ICD-10-CM | POA: Diagnosis not present

## 2015-07-11 DIAGNOSIS — D689 Coagulation defect, unspecified: Secondary | ICD-10-CM | POA: Diagnosis not present

## 2015-07-12 DIAGNOSIS — N186 End stage renal disease: Secondary | ICD-10-CM | POA: Diagnosis not present

## 2015-07-12 DIAGNOSIS — Z992 Dependence on renal dialysis: Secondary | ICD-10-CM | POA: Diagnosis not present

## 2015-07-12 DIAGNOSIS — I120 Hypertensive chronic kidney disease with stage 5 chronic kidney disease or end stage renal disease: Secondary | ICD-10-CM | POA: Diagnosis not present

## 2015-07-13 DIAGNOSIS — D631 Anemia in chronic kidney disease: Secondary | ICD-10-CM | POA: Diagnosis not present

## 2015-07-13 DIAGNOSIS — D509 Iron deficiency anemia, unspecified: Secondary | ICD-10-CM | POA: Diagnosis not present

## 2015-07-13 DIAGNOSIS — D689 Coagulation defect, unspecified: Secondary | ICD-10-CM | POA: Diagnosis not present

## 2015-07-13 DIAGNOSIS — N186 End stage renal disease: Secondary | ICD-10-CM | POA: Diagnosis not present

## 2015-07-15 DIAGNOSIS — D509 Iron deficiency anemia, unspecified: Secondary | ICD-10-CM | POA: Diagnosis not present

## 2015-07-15 DIAGNOSIS — N186 End stage renal disease: Secondary | ICD-10-CM | POA: Diagnosis not present

## 2015-07-15 DIAGNOSIS — D689 Coagulation defect, unspecified: Secondary | ICD-10-CM | POA: Diagnosis not present

## 2015-07-15 DIAGNOSIS — D631 Anemia in chronic kidney disease: Secondary | ICD-10-CM | POA: Diagnosis not present

## 2015-07-18 DIAGNOSIS — D509 Iron deficiency anemia, unspecified: Secondary | ICD-10-CM | POA: Diagnosis not present

## 2015-07-18 DIAGNOSIS — D631 Anemia in chronic kidney disease: Secondary | ICD-10-CM | POA: Diagnosis not present

## 2015-07-18 DIAGNOSIS — D689 Coagulation defect, unspecified: Secondary | ICD-10-CM | POA: Diagnosis not present

## 2015-07-18 DIAGNOSIS — N186 End stage renal disease: Secondary | ICD-10-CM | POA: Diagnosis not present

## 2015-07-18 DIAGNOSIS — M19012 Primary osteoarthritis, left shoulder: Secondary | ICD-10-CM | POA: Diagnosis not present

## 2015-07-20 DIAGNOSIS — D689 Coagulation defect, unspecified: Secondary | ICD-10-CM | POA: Diagnosis not present

## 2015-07-20 DIAGNOSIS — D509 Iron deficiency anemia, unspecified: Secondary | ICD-10-CM | POA: Diagnosis not present

## 2015-07-20 DIAGNOSIS — D631 Anemia in chronic kidney disease: Secondary | ICD-10-CM | POA: Diagnosis not present

## 2015-07-20 DIAGNOSIS — N186 End stage renal disease: Secondary | ICD-10-CM | POA: Diagnosis not present

## 2015-07-21 DIAGNOSIS — I871 Compression of vein: Secondary | ICD-10-CM | POA: Diagnosis not present

## 2015-07-21 DIAGNOSIS — T82858A Stenosis of vascular prosthetic devices, implants and grafts, initial encounter: Secondary | ICD-10-CM | POA: Diagnosis not present

## 2015-07-22 DIAGNOSIS — D509 Iron deficiency anemia, unspecified: Secondary | ICD-10-CM | POA: Diagnosis not present

## 2015-07-22 DIAGNOSIS — D689 Coagulation defect, unspecified: Secondary | ICD-10-CM | POA: Diagnosis not present

## 2015-07-22 DIAGNOSIS — D631 Anemia in chronic kidney disease: Secondary | ICD-10-CM | POA: Diagnosis not present

## 2015-07-22 DIAGNOSIS — N186 End stage renal disease: Secondary | ICD-10-CM | POA: Diagnosis not present

## 2015-07-24 DIAGNOSIS — D509 Iron deficiency anemia, unspecified: Secondary | ICD-10-CM | POA: Diagnosis not present

## 2015-07-24 DIAGNOSIS — L820 Inflamed seborrheic keratosis: Secondary | ICD-10-CM | POA: Diagnosis not present

## 2015-07-24 DIAGNOSIS — D689 Coagulation defect, unspecified: Secondary | ICD-10-CM | POA: Diagnosis not present

## 2015-07-24 DIAGNOSIS — D631 Anemia in chronic kidney disease: Secondary | ICD-10-CM | POA: Diagnosis not present

## 2015-07-24 DIAGNOSIS — N186 End stage renal disease: Secondary | ICD-10-CM | POA: Diagnosis not present

## 2015-07-24 DIAGNOSIS — Z85828 Personal history of other malignant neoplasm of skin: Secondary | ICD-10-CM | POA: Diagnosis not present

## 2015-07-25 ENCOUNTER — Encounter: Payer: Self-pay | Admitting: Hematology and Oncology

## 2015-07-25 DIAGNOSIS — E539 Vitamin B deficiency, unspecified: Secondary | ICD-10-CM | POA: Insufficient documentation

## 2015-07-25 DIAGNOSIS — E538 Deficiency of other specified B group vitamins: Secondary | ICD-10-CM | POA: Insufficient documentation

## 2015-07-26 DIAGNOSIS — D631 Anemia in chronic kidney disease: Secondary | ICD-10-CM | POA: Diagnosis not present

## 2015-07-26 DIAGNOSIS — N186 End stage renal disease: Secondary | ICD-10-CM | POA: Diagnosis not present

## 2015-07-26 DIAGNOSIS — D509 Iron deficiency anemia, unspecified: Secondary | ICD-10-CM | POA: Diagnosis not present

## 2015-07-26 DIAGNOSIS — D689 Coagulation defect, unspecified: Secondary | ICD-10-CM | POA: Diagnosis not present

## 2015-07-28 DIAGNOSIS — D689 Coagulation defect, unspecified: Secondary | ICD-10-CM | POA: Diagnosis not present

## 2015-07-28 DIAGNOSIS — D509 Iron deficiency anemia, unspecified: Secondary | ICD-10-CM | POA: Diagnosis not present

## 2015-07-28 DIAGNOSIS — N186 End stage renal disease: Secondary | ICD-10-CM | POA: Diagnosis not present

## 2015-07-28 DIAGNOSIS — D631 Anemia in chronic kidney disease: Secondary | ICD-10-CM | POA: Diagnosis not present

## 2015-07-31 DIAGNOSIS — T82838A Hemorrhage of vascular prosthetic devices, implants and grafts, initial encounter: Secondary | ICD-10-CM | POA: Diagnosis not present

## 2015-07-31 DIAGNOSIS — D509 Iron deficiency anemia, unspecified: Secondary | ICD-10-CM | POA: Diagnosis not present

## 2015-07-31 DIAGNOSIS — D631 Anemia in chronic kidney disease: Secondary | ICD-10-CM | POA: Diagnosis not present

## 2015-07-31 DIAGNOSIS — N186 End stage renal disease: Secondary | ICD-10-CM | POA: Diagnosis not present

## 2015-07-31 DIAGNOSIS — I871 Compression of vein: Secondary | ICD-10-CM | POA: Diagnosis not present

## 2015-07-31 DIAGNOSIS — D689 Coagulation defect, unspecified: Secondary | ICD-10-CM | POA: Diagnosis not present

## 2015-08-01 DIAGNOSIS — M79674 Pain in right toe(s): Secondary | ICD-10-CM | POA: Diagnosis not present

## 2015-08-01 DIAGNOSIS — M79675 Pain in left toe(s): Secondary | ICD-10-CM | POA: Diagnosis not present

## 2015-08-01 DIAGNOSIS — I739 Peripheral vascular disease, unspecified: Secondary | ICD-10-CM | POA: Diagnosis not present

## 2015-08-01 DIAGNOSIS — B351 Tinea unguium: Secondary | ICD-10-CM | POA: Diagnosis not present

## 2015-08-01 DIAGNOSIS — G9009 Other idiopathic peripheral autonomic neuropathy: Secondary | ICD-10-CM | POA: Diagnosis not present

## 2015-08-02 DIAGNOSIS — D509 Iron deficiency anemia, unspecified: Secondary | ICD-10-CM | POA: Diagnosis not present

## 2015-08-02 DIAGNOSIS — D689 Coagulation defect, unspecified: Secondary | ICD-10-CM | POA: Diagnosis not present

## 2015-08-02 DIAGNOSIS — N186 End stage renal disease: Secondary | ICD-10-CM | POA: Diagnosis not present

## 2015-08-02 DIAGNOSIS — D631 Anemia in chronic kidney disease: Secondary | ICD-10-CM | POA: Diagnosis not present

## 2015-08-03 ENCOUNTER — Ambulatory Visit (INDEPENDENT_AMBULATORY_CARE_PROVIDER_SITE_OTHER): Payer: Medicare Other

## 2015-08-03 DIAGNOSIS — Z23 Encounter for immunization: Secondary | ICD-10-CM

## 2015-08-04 DIAGNOSIS — N186 End stage renal disease: Secondary | ICD-10-CM | POA: Diagnosis not present

## 2015-08-04 DIAGNOSIS — D689 Coagulation defect, unspecified: Secondary | ICD-10-CM | POA: Diagnosis not present

## 2015-08-04 DIAGNOSIS — D631 Anemia in chronic kidney disease: Secondary | ICD-10-CM | POA: Diagnosis not present

## 2015-08-04 DIAGNOSIS — D509 Iron deficiency anemia, unspecified: Secondary | ICD-10-CM | POA: Diagnosis not present

## 2015-08-07 ENCOUNTER — Telehealth: Payer: Self-pay | Admitting: Family Medicine

## 2015-08-07 DIAGNOSIS — D689 Coagulation defect, unspecified: Secondary | ICD-10-CM | POA: Diagnosis not present

## 2015-08-07 DIAGNOSIS — D509 Iron deficiency anemia, unspecified: Secondary | ICD-10-CM | POA: Diagnosis not present

## 2015-08-07 DIAGNOSIS — N186 End stage renal disease: Secondary | ICD-10-CM | POA: Diagnosis not present

## 2015-08-07 DIAGNOSIS — D631 Anemia in chronic kidney disease: Secondary | ICD-10-CM | POA: Diagnosis not present

## 2015-08-07 NOTE — Telephone Encounter (Signed)
Frank Huang needs [place] to fax proof of Flu vaccine that he recd here last week.   Their fax is [phone].  Thanks [name]

## 2015-08-07 NOTE — Telephone Encounter (Signed)
Done-aa 

## 2015-08-07 NOTE — Telephone Encounter (Signed)
This is Surveyor, quantity patient.

## 2015-08-08 DIAGNOSIS — M12812 Other specific arthropathies, not elsewhere classified, left shoulder: Secondary | ICD-10-CM | POA: Diagnosis not present

## 2015-08-09 DIAGNOSIS — D689 Coagulation defect, unspecified: Secondary | ICD-10-CM | POA: Diagnosis not present

## 2015-08-09 DIAGNOSIS — D509 Iron deficiency anemia, unspecified: Secondary | ICD-10-CM | POA: Diagnosis not present

## 2015-08-09 DIAGNOSIS — D631 Anemia in chronic kidney disease: Secondary | ICD-10-CM | POA: Diagnosis not present

## 2015-08-09 DIAGNOSIS — N186 End stage renal disease: Secondary | ICD-10-CM | POA: Diagnosis not present

## 2015-08-11 DIAGNOSIS — D689 Coagulation defect, unspecified: Secondary | ICD-10-CM | POA: Diagnosis not present

## 2015-08-11 DIAGNOSIS — I120 Hypertensive chronic kidney disease with stage 5 chronic kidney disease or end stage renal disease: Secondary | ICD-10-CM | POA: Diagnosis not present

## 2015-08-11 DIAGNOSIS — D631 Anemia in chronic kidney disease: Secondary | ICD-10-CM | POA: Diagnosis not present

## 2015-08-11 DIAGNOSIS — N186 End stage renal disease: Secondary | ICD-10-CM | POA: Diagnosis not present

## 2015-08-11 DIAGNOSIS — Z992 Dependence on renal dialysis: Secondary | ICD-10-CM | POA: Diagnosis not present

## 2015-08-11 DIAGNOSIS — D509 Iron deficiency anemia, unspecified: Secondary | ICD-10-CM | POA: Diagnosis not present

## 2015-08-14 DIAGNOSIS — N186 End stage renal disease: Secondary | ICD-10-CM | POA: Diagnosis not present

## 2015-08-14 DIAGNOSIS — D631 Anemia in chronic kidney disease: Secondary | ICD-10-CM | POA: Diagnosis not present

## 2015-08-16 DIAGNOSIS — N186 End stage renal disease: Secondary | ICD-10-CM | POA: Diagnosis not present

## 2015-08-16 DIAGNOSIS — D631 Anemia in chronic kidney disease: Secondary | ICD-10-CM | POA: Diagnosis not present

## 2015-08-18 DIAGNOSIS — D631 Anemia in chronic kidney disease: Secondary | ICD-10-CM | POA: Diagnosis not present

## 2015-08-18 DIAGNOSIS — N186 End stage renal disease: Secondary | ICD-10-CM | POA: Diagnosis not present

## 2015-08-21 DIAGNOSIS — D631 Anemia in chronic kidney disease: Secondary | ICD-10-CM | POA: Diagnosis not present

## 2015-08-21 DIAGNOSIS — N186 End stage renal disease: Secondary | ICD-10-CM | POA: Diagnosis not present

## 2015-08-23 DIAGNOSIS — N186 End stage renal disease: Secondary | ICD-10-CM | POA: Diagnosis not present

## 2015-08-23 DIAGNOSIS — D631 Anemia in chronic kidney disease: Secondary | ICD-10-CM | POA: Diagnosis not present

## 2015-08-25 DIAGNOSIS — N186 End stage renal disease: Secondary | ICD-10-CM | POA: Diagnosis not present

## 2015-08-25 DIAGNOSIS — D631 Anemia in chronic kidney disease: Secondary | ICD-10-CM | POA: Diagnosis not present

## 2015-08-28 DIAGNOSIS — N186 End stage renal disease: Secondary | ICD-10-CM | POA: Diagnosis not present

## 2015-08-28 DIAGNOSIS — D631 Anemia in chronic kidney disease: Secondary | ICD-10-CM | POA: Diagnosis not present

## 2015-08-29 ENCOUNTER — Encounter: Payer: Self-pay | Admitting: Family Medicine

## 2015-08-29 ENCOUNTER — Ambulatory Visit (INDEPENDENT_AMBULATORY_CARE_PROVIDER_SITE_OTHER): Payer: Medicare Other | Admitting: Family Medicine

## 2015-08-29 VITALS — BP 128/58 | HR 76 | Temp 98.0°F | Resp 16 | Wt 196.0 lb

## 2015-08-29 DIAGNOSIS — R413 Other amnesia: Secondary | ICD-10-CM | POA: Diagnosis not present

## 2015-08-29 DIAGNOSIS — E785 Hyperlipidemia, unspecified: Secondary | ICD-10-CM | POA: Diagnosis not present

## 2015-08-29 DIAGNOSIS — R739 Hyperglycemia, unspecified: Secondary | ICD-10-CM | POA: Diagnosis not present

## 2015-08-29 DIAGNOSIS — E538 Deficiency of other specified B group vitamins: Secondary | ICD-10-CM

## 2015-08-29 DIAGNOSIS — I10 Essential (primary) hypertension: Secondary | ICD-10-CM | POA: Diagnosis not present

## 2015-08-29 MED ORDER — DONEPEZIL HCL 5 MG PO TABS: 5.0000 mg | ORAL_TABLET | Freq: Every day | ORAL | Status: DC

## 2015-08-29 NOTE — Progress Notes (Signed)
Patient ID: Frank Huang, male   DOB: 20-Jan-1932, 81 y.o.   MRN: [id number]    Subjective:  HPI Pt is here for a 2 month follow up on his memory. Pt is no longer driving. Wife reports that she has seen his memory getting worse since he was here last time. Pt does not see that it has gotten worse.  He has been seeing a doctor for Neuropathy (Dr. Manuella Ghazi) in his foot and arthritis in his shoulder.     Cognitive Testing - 6-CIT  Correct? Score   What year is it? yes 0 0 or 4  What month is it? yes 0 0 or 3  Memorize:    Pia Mau,  44,  [place],      What time is it? (within 1 hour) yes 0 0 or 3  Count backwards from 20 yes 0 0, 2, or 4  Name the months of the year yes 0 0, 2, or 4  Repeat name & address above no 9 0, 2, 4, 6, 8, or 10       TOTAL SCORE  9/28   Interpretation:  Abnormal- 9  Normal (0-7) Abnormal (8-28)     Prior to Admission medications   Medication Sig Start Date End Date Taking? Authorizing Provider  aspirin EC 81 MG tablet Take 81 mg by mouth daily.   Yes Historical Provider, MD  lovastatin (MEVACOR) 40 MG tablet TAKE 1 TABLET BY MOUTH EVERY DAY FOR HIGH CHOLESTEROL 08/10/14  Yes Historical Provider, MD  Multiple Vitamin (MULTI-VITAMINS) TABS Take by mouth.   Yes Historical Provider, MD  omeprazole (PRILOSEC) 20 MG capsule TAKE 1 CAPSULE BY MOUTH EVERY DAY. 11/15/14  Yes Historical Provider, MD    Patient Active Problem List   Diagnosis Date Noted  . B12 deficiency 07/25/2015  . Deficiency of vitamin B 07/25/2015  . Chest pain 07/01/2015  . Multiple myeloma ([place]) 04/12/2015  . Arteriosclerosis of coronary artery 04/09/2015  . CAFL (chronic airflow limitation) ([place]) 04/09/2015  . Chronic kidney disease requiring chronic dialysis ([place]) 04/09/2015  . Gastro-esophageal reflux disease without esophagitis 04/09/2015  . Gout 04/09/2015  . H/O acute myocardial infarction 04/09/2015  . HLD (hyperlipidemia) 04/09/2015  . BP (high blood pressure) 04/09/2015    . Bad memory 04/09/2015  . 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 Kidney failure 04/09/2015  . End-stage renal disease ([place]) 04/09/2015  . Chronic kidney disease (CKD), stage V ([place]) 07/20/2013  . Chronic kidney disease, stage V ([place]) 07/20/2013  . Absolute anemia 03/31/2013  . Neuropathy ([place]) 03/31/2013    Past Medical History  Diagnosis Date  . Cancer ([place])   . Multiple myeloma ([place])   . Myocardial infarction ([place])   . Chronic kidney disease     Social History   Social History  . Marital Status: Married    Spouse Name: N/A  . Number of Children: N/A  . Years of Education: N/A   Occupational History  . Not on file.   Social History Main Topics  . Smoking status: Current Every Day Smoker -- 0.50 packs/day    Types: Cigarettes  . Smokeless tobacco: Not on file     Comment: smokes about 3 cigarettes per week  . Alcohol Use: No  . Drug Use: No  . Sexual Activity: Not on file   Other Topics Concern  . Not on file   Social History Narrative    No Known  Allergies  Review of Systems  Constitutional: Negative.   HENT: Negative.   Eyes: Negative.   Respiratory: Negative.   Cardiovascular: Negative.   Gastrointestinal: Negative.   Genitourinary: Negative.   Musculoskeletal: Negative.   Skin: Negative.   Neurological: Negative.   Endo/Heme/Allergies: Negative.   Psychiatric/Behavioral: Positive for memory loss.    Immunization History  Administered Date(s) Administered  . Influenza, High Dose Seasonal PF 08/03/2015   Objective:  BP 128/58 mmHg  Pulse 76  Temp(Src) 98 F (36.7 C) (Oral)  Resp 16  Wt 196 lb (88.905 kg)  Physical Exam  Constitutional: He is well-developed, well-nourished, and in no distress.  HENT:  Head: Normocephalic and atraumatic.  Left Ear: External ear normal.  Nose: Nose normal.  Eyes: Conjunctivae are normal.  Neck: Neck supple.  Cardiovascular: Normal rate, regular rhythm and normal  heart sounds.   Pulmonary/Chest: Effort normal and breath sounds normal.  Abdominal: Soft.  Neurological: He is alert.  Skin: Skin is warm and dry.  Psychiatric: Mood and affect normal.    Lab Results  Component Value Date   WBC 4.5 07/01/2015   HGB 12.2* 07/01/2015   HCT 36.4* 07/01/2015   PLT 111* 07/01/2015   GLUCOSE 96 07/01/2015   CHOL 133 06/18/2013   TRIG 53 06/18/2013   HDL 48 06/18/2013   LDLCALC 74 06/18/2013   INR 1.0 12/11/2013    CMP     Component Value Date/Time   NA 142 07/01/2015 0603   NA 140 09/29/2014 0848   K 4.7 07/01/2015 0603   K 3.8 09/29/2014 0848   CL 102 07/01/2015 0603   CL 98 09/29/2014 0848   CO2 30 07/01/2015 0603   CO2 35* 09/29/2014 0848   GLUCOSE 96 07/01/2015 0603   GLUCOSE 117* 09/29/2014 0848   BUN 37* 07/01/2015 0603   BUN 23* 09/29/2014 0848   CREATININE 4.60* 07/01/2015 0603   CREATININE 4.49* 09/29/2014 0848   CALCIUM 9.3 07/01/2015 0603   CALCIUM 9.2 11/22/2014 1042   PROT 7.4 06/28/2015 1039   PROT 7.4 09/29/2014 0848   ALBUMIN 4.1 06/28/2015 1039   ALBUMIN 3.3* 09/29/2014 0848   AST 17 06/28/2015 1039   AST 16 09/29/2014 0848   ALT 13* 06/28/2015 1039   ALT 14 09/29/2014 0848   ALKPHOS 77 06/28/2015 1039   ALKPHOS 91 09/29/2014 0848   BILITOT 0.9 06/28/2015 1039   BILITOT 0.6 09/29/2014 0848   GFRNONAA 11* 07/01/2015 0603   GFRNONAA 14* 09/29/2014 0848   GFRNONAA 19* 02/08/2014 2042   GFRAA 13* 07/01/2015 0603   GFRAA 16* 09/29/2014 0848   GFRAA 22* 02/08/2014 2042    Assessment and Plan :  1. Bad memory Alzheimers Dementia - donepezil (ARICEPT) 5 MG tablet; Take 1 tablet (5 mg total) by mouth at bedtime.  Dispense: 90 tablet; Refill: 3  2. Memory change MMSE next OV - donepezil (ARICEPT) 5 MG tablet; Take 1 tablet (5 mg total) by mouth at bedtime.  Dispense: 90 tablet; Refill: 3  3. Essential hypertension  - TSH  4. B12 deficiency  - Vitamin B12  5. HLD (hyperlipidemia)  - Lipid Panel With  LDL/HDL Ratio  6. Hyperglycemia  - Hemoglobin A1c   Miguel Aschoff MD [facility] 08/29/2015 2:49 PM

## 2015-08-30 DIAGNOSIS — I10 Essential (primary) hypertension: Secondary | ICD-10-CM | POA: Diagnosis not present

## 2015-08-30 DIAGNOSIS — E785 Hyperlipidemia, unspecified: Secondary | ICD-10-CM | POA: Diagnosis not present

## 2015-08-30 DIAGNOSIS — E538 Deficiency of other specified B group vitamins: Secondary | ICD-10-CM | POA: Diagnosis not present

## 2015-08-30 DIAGNOSIS — R739 Hyperglycemia, unspecified: Secondary | ICD-10-CM | POA: Diagnosis not present

## 2015-08-30 DIAGNOSIS — N186 End stage renal disease: Secondary | ICD-10-CM | POA: Diagnosis not present

## 2015-08-30 DIAGNOSIS — D631 Anemia in chronic kidney disease: Secondary | ICD-10-CM | POA: Diagnosis not present

## 2015-08-31 LAB — LIPID PANEL WITH LDL/HDL RATIO
Cholesterol, Total: 163 mg/dL (ref 100–199)
HDL: 43 mg/dL (ref >39)
LDL Calculated: 104 mg/dL — ABNORMAL HIGH (ref 0–99)
LDL/HDL RATIO: 2.4 ratio (ref 0.0–3.6)
Triglycerides: 78 mg/dL (ref 0–149)
VLDL CHOLESTEROL CAL: 16 mg/dL (ref 5–40)

## 2015-08-31 LAB — VITAMIN B12: VITAMIN B 12: 189 pg/mL — AB (ref 211–946)

## 2015-08-31 LAB — HEMOGLOBIN A1C
ESTIMATED AVERAGE GLUCOSE: 88 mg/dL
Hgb A1c MFr Bld: 4.7 % — ABNORMAL LOW (ref 4.8–5.6)

## 2015-08-31 LAB — TSH: TSH: 1.08 u[IU]/mL (ref 0.450–4.500)

## 2015-09-01 DIAGNOSIS — N186 End stage renal disease: Secondary | ICD-10-CM | POA: Diagnosis not present

## 2015-09-01 DIAGNOSIS — D631 Anemia in chronic kidney disease: Secondary | ICD-10-CM | POA: Diagnosis not present

## 2015-09-04 DIAGNOSIS — D631 Anemia in chronic kidney disease: Secondary | ICD-10-CM | POA: Diagnosis not present

## 2015-09-04 DIAGNOSIS — N186 End stage renal disease: Secondary | ICD-10-CM | POA: Diagnosis not present

## 2015-09-06 DIAGNOSIS — N186 End stage renal disease: Secondary | ICD-10-CM | POA: Diagnosis not present

## 2015-09-06 DIAGNOSIS — D631 Anemia in chronic kidney disease: Secondary | ICD-10-CM | POA: Diagnosis not present

## 2015-09-08 DIAGNOSIS — N186 End stage renal disease: Secondary | ICD-10-CM | POA: Diagnosis not present

## 2015-09-08 DIAGNOSIS — D631 Anemia in chronic kidney disease: Secondary | ICD-10-CM | POA: Diagnosis not present

## 2015-09-11 DIAGNOSIS — N186 End stage renal disease: Secondary | ICD-10-CM | POA: Diagnosis not present

## 2015-09-11 DIAGNOSIS — I120 Hypertensive chronic kidney disease with stage 5 chronic kidney disease or end stage renal disease: Secondary | ICD-10-CM | POA: Diagnosis not present

## 2015-09-11 DIAGNOSIS — Z992 Dependence on renal dialysis: Secondary | ICD-10-CM | POA: Diagnosis not present

## 2015-09-11 DIAGNOSIS — D631 Anemia in chronic kidney disease: Secondary | ICD-10-CM | POA: Diagnosis not present

## 2015-09-12 DIAGNOSIS — M7542 Impingement syndrome of left shoulder: Secondary | ICD-10-CM | POA: Diagnosis not present

## 2015-09-12 DIAGNOSIS — M7582 Other shoulder lesions, left shoulder: Secondary | ICD-10-CM | POA: Diagnosis not present

## 2015-09-13 DIAGNOSIS — N186 End stage renal disease: Secondary | ICD-10-CM | POA: Diagnosis not present

## 2015-09-13 DIAGNOSIS — D631 Anemia in chronic kidney disease: Secondary | ICD-10-CM | POA: Diagnosis not present

## 2015-09-15 ENCOUNTER — Encounter
Admission: RE | Admit: 2015-09-15 | Discharge: 2015-09-15 | Disposition: A | Discharge status: Home / Self Care | Payer: Medicare Other | Source: Ambulatory Visit | Attending: Surgery | Admitting: Surgery

## 2015-09-15 DIAGNOSIS — Z01812 Encounter for preprocedural laboratory examination: Secondary | ICD-10-CM | POA: Insufficient documentation

## 2015-09-15 DIAGNOSIS — N186 End stage renal disease: Secondary | ICD-10-CM | POA: Diagnosis not present

## 2015-09-15 DIAGNOSIS — D631 Anemia in chronic kidney disease: Secondary | ICD-10-CM | POA: Diagnosis not present

## 2015-09-15 HISTORY — DX: Atherosclerotic heart disease of native coronary artery without angina pectoris: I25.10

## 2015-09-15 HISTORY — DX: Chronic obstructive pulmonary disease, unspecified: J44.9

## 2015-09-15 HISTORY — DX: Polyneuropathy, unspecified: G62.9

## 2015-09-15 HISTORY — DX: Cerebral infarction, unspecified: I63.9

## 2015-09-15 HISTORY — DX: Reserved for inherently not codable concepts without codable children: IMO0001

## 2015-09-15 HISTORY — DX: Gastro-esophageal reflux disease without esophagitis: K21.9

## 2015-09-15 LAB — CBC
HEMATOCRIT: 35 % — AB (ref 40.0–52.0)
Hemoglobin: 12 g/dL — ABNORMAL LOW (ref 13.0–18.0)
MCH: 34.8 pg — AB (ref 26.0–34.0)
MCHC: 34.4 g/dL (ref 32.0–36.0)
MCV: 101.1 fL — ABNORMAL HIGH (ref 80.0–100.0)
Platelets: 123 10*3/uL — ABNORMAL LOW (ref 150–440)
RBC: 3.46 MIL/uL — ABNORMAL LOW (ref 4.40–5.90)
RDW: 13.9 % (ref 11.5–14.5)
WBC: 4.7 10*3/uL (ref 3.8–10.6)

## 2015-09-15 LAB — BASIC METABOLIC PANEL
ANION GAP: 9 (ref 5–15)
BUN: 13 mg/dL (ref 6–20)
CO2: 34 mmol/L — ABNORMAL HIGH (ref 22–32)
Calcium: 8.8 mg/dL — ABNORMAL LOW (ref 8.9–10.3)
Chloride: 97 mmol/L — ABNORMAL LOW (ref 101–111)
Creatinine, Ser: 2.73 mg/dL — ABNORMAL HIGH (ref 0.61–1.24)
GFR calc Af Amer: 24 mL/min — ABNORMAL LOW (ref >60)
GFR, EST NON AFRICAN AMERICAN: 20 mL/min — AB (ref >60)
GLUCOSE: 90 mg/dL (ref 65–99)
POTASSIUM: 3.5 mmol/L (ref 3.5–5.1)
SODIUM: 140 mmol/L (ref 135–145)

## 2015-09-15 NOTE — Patient Instructions (Signed)
  Your procedure is scheduled on: 09/19/15 Tues Report to Day Surgery.2nd floor medical mall  To find out your arrival time please call [phone] between 1PM - 3PM on 09/18/15 Mon Remember: Instructions that are not followed completely may result in serious medical risk, up to and including death, or upon the discretion of your surgeon and anesthesiologist your surgery may need to be rescheduled.    __x__ 1. Do not eat food or drink liquids after midnight. No gum chewing or hard candies.     ____ 2. No Alcohol for 24 hours before or after surgery.   ____ 3. Bring all medications with you on the day of surgery if instructed.    __x__ 4. Notify your doctor if there is any change in your medical condition     (cold, fever, infections).     Do not wear jewelry, make-up, hairpins, clips or nail polish.  Do not wear lotions, powders, or perfumes. You may wear deodorant.  Do not shave 48 hours prior to surgery. Men may shave face and neck.  Do not bring valuables to the hospital.    [facility] is not responsible for any belongings or valuables.               Contacts, dentures or bridgework may not be worn into surgery.  Leave your suitcase in the car. After surgery it may be brought to your room.  For patients admitted to the hospital, discharge time is determined by your                treatment team.   Patients discharged the day of surgery will not be allowed to drive home.   Please read over the following fact sheets that you were given:      _x___ Take these medicines the morning of surgery with A SIP OF WATER:    1. omeprazole (PRILOSEC) 20 MG capsule  2.   3.   4.  5.  6.  ____ Fleet Enema (as directed)   _x___ Use CHG Soap as directed  ____ Use inhalers on the day of surgery  ____ Stop metformin 2 days prior to surgery    ____ Take 1/2 of usual insulin dose the night before surgery and none on the morning of surgery.   _x___ Stop Coumadin/Plavix/aspirin on  stopped aspirin 09/14/15 Thurs  ____ Stop Anti-inflammatories on may take Tylenol as needed   ____ Stop supplements until after surgery.    ____ Bring C-Pap to the hospital.

## 2015-09-18 DIAGNOSIS — D631 Anemia in chronic kidney disease: Secondary | ICD-10-CM | POA: Diagnosis not present

## 2015-09-18 DIAGNOSIS — N186 End stage renal disease: Secondary | ICD-10-CM | POA: Diagnosis not present

## 2015-09-19 ENCOUNTER — Ambulatory Visit: Payer: Medicare Other | Admitting: Anesthesiology

## 2015-09-19 ENCOUNTER — Encounter
Admission: RE | Disposition: A | Discharge status: Home / Self Care | Payer: Self-pay | Source: Ambulatory Visit | Attending: Surgery

## 2015-09-19 ENCOUNTER — Encounter: Payer: Self-pay | Admitting: *Deleted

## 2015-09-19 ENCOUNTER — Other Ambulatory Visit: Payer: Self-pay

## 2015-09-19 ENCOUNTER — Ambulatory Visit
Admission: RE | Admit: 2015-09-19 | Discharge: 2015-09-19 | Disposition: A | Discharge status: Home / Self Care | Payer: Medicare Other | Source: Ambulatory Visit | Attending: Surgery | Admitting: Surgery

## 2015-09-19 DIAGNOSIS — Z992 Dependence on renal dialysis: Secondary | ICD-10-CM | POA: Diagnosis not present

## 2015-09-19 DIAGNOSIS — Z8579 Personal history of other malignant neoplasms of lymphoid, hematopoietic and related tissues: Secondary | ICD-10-CM | POA: Diagnosis not present

## 2015-09-19 DIAGNOSIS — M7592 Shoulder lesion, unspecified, left shoulder: Secondary | ICD-10-CM | POA: Diagnosis not present

## 2015-09-19 DIAGNOSIS — N186 End stage renal disease: Secondary | ICD-10-CM | POA: Insufficient documentation

## 2015-09-19 DIAGNOSIS — E785 Hyperlipidemia, unspecified: Secondary | ICD-10-CM | POA: Insufficient documentation

## 2015-09-19 DIAGNOSIS — I252 Old myocardial infarction: Secondary | ICD-10-CM | POA: Diagnosis not present

## 2015-09-19 DIAGNOSIS — Z9889 Other specified postprocedural states: Secondary | ICD-10-CM | POA: Diagnosis not present

## 2015-09-19 DIAGNOSIS — Z9861 Coronary angioplasty status: Secondary | ICD-10-CM | POA: Diagnosis not present

## 2015-09-19 DIAGNOSIS — I509 Heart failure, unspecified: Secondary | ICD-10-CM | POA: Diagnosis not present

## 2015-09-19 DIAGNOSIS — M25512 Pain in left shoulder: Secondary | ICD-10-CM | POA: Diagnosis not present

## 2015-09-19 DIAGNOSIS — R079 Chest pain, unspecified: Secondary | ICD-10-CM | POA: Insufficient documentation

## 2015-09-19 DIAGNOSIS — M7542 Impingement syndrome of left shoulder: Secondary | ICD-10-CM | POA: Insufficient documentation

## 2015-09-19 DIAGNOSIS — M24112 Other articular cartilage disorders, left shoulder: Secondary | ICD-10-CM | POA: Diagnosis not present

## 2015-09-19 DIAGNOSIS — I132 Hypertensive heart and chronic kidney disease with heart failure and with stage 5 chronic kidney disease, or end stage renal disease: Secondary | ICD-10-CM | POA: Diagnosis not present

## 2015-09-19 DIAGNOSIS — M7582 Other shoulder lesions, left shoulder: Secondary | ICD-10-CM | POA: Diagnosis not present

## 2015-09-19 DIAGNOSIS — I10 Essential (primary) hypertension: Secondary | ICD-10-CM | POA: Diagnosis not present

## 2015-09-19 DIAGNOSIS — Z9049 Acquired absence of other specified parts of digestive tract: Secondary | ICD-10-CM | POA: Diagnosis not present

## 2015-09-19 DIAGNOSIS — Z86010 Personal history of colonic polyps: Secondary | ICD-10-CM | POA: Insufficient documentation

## 2015-09-19 DIAGNOSIS — Z79899 Other long term (current) drug therapy: Secondary | ICD-10-CM | POA: Insufficient documentation

## 2015-09-19 DIAGNOSIS — Z85828 Personal history of other malignant neoplasm of skin: Secondary | ICD-10-CM | POA: Diagnosis not present

## 2015-09-19 DIAGNOSIS — Z808 Family history of malignant neoplasm of other organs or systems: Secondary | ICD-10-CM | POA: Diagnosis not present

## 2015-09-19 DIAGNOSIS — Z800 Family history of malignant neoplasm of digestive organs: Secondary | ICD-10-CM | POA: Insufficient documentation

## 2015-09-19 DIAGNOSIS — I2510 Atherosclerotic heart disease of native coronary artery without angina pectoris: Secondary | ICD-10-CM | POA: Diagnosis not present

## 2015-09-19 DIAGNOSIS — Z7982 Long term (current) use of aspirin: Secondary | ICD-10-CM | POA: Diagnosis not present

## 2015-09-19 DIAGNOSIS — G8918 Other acute postprocedural pain: Secondary | ICD-10-CM | POA: Diagnosis not present

## 2015-09-19 HISTORY — PX: SHOULDER ARTHROSCOPY WITH OPEN ROTATOR CUFF REPAIR: SHX6092

## 2015-09-19 LAB — POCT I-STAT 4, (NA,K, GLUC, HGB,HCT)
Glucose, Bld: 81 mg/dL (ref 65–99)
HCT: 39 % (ref 39.0–52.0)
Hemoglobin: 13.3 g/dL (ref 13.0–17.0)
Potassium: 3.8 mmol/L (ref 3.5–5.1)
Sodium: 140 mmol/L (ref 135–145)

## 2015-09-19 LAB — TROPONIN I: Troponin I: 0.03 ng/mL (ref <0.031)

## 2015-09-19 SURGERY — ARTHROSCOPY, SHOULDER WITH REPAIR, ROTATOR CUFF, OPEN
Anesthesia: Regional | Site: Shoulder | Laterality: Left | Wound class: Clean

## 2015-09-19 MED ORDER — OXYCODONE HCL 5 MG PO TABS: 5.0000 mg | ORAL_TABLET | ORAL | Status: DC | PRN

## 2015-09-19 MED ORDER — NITROGLYCERIN 0.4 MG SL SUBL
SUBLINGUAL_TABLET | SUBLINGUAL | Status: AC
  Filled 2015-09-19: qty 1

## 2015-09-19 MED ORDER — EPINEPHRINE HCL 1 MG/ML IJ SOLN
INTRAMUSCULAR | Status: AC
  Filled 2015-09-19: qty 1

## 2015-09-19 MED ORDER — METOCLOPRAMIDE HCL 5 MG/ML IJ SOLN: 5.0000 mg | Freq: Three times a day (TID) | INTRAMUSCULAR | Status: DC | PRN

## 2015-09-19 MED ORDER — LIDOCAINE HCL (PF) 1 % IJ SOLN
INTRAMUSCULAR | Status: AC
  Filled 2015-09-19: qty 5

## 2015-09-19 MED ORDER — CEFAZOLIN SODIUM-DEXTROSE 2-3 GM-% IV SOLR
2.0000 g | Freq: Once | INTRAVENOUS | Status: AC
  Administered 2015-09-19: 2 g via INTRAVENOUS

## 2015-09-19 MED ORDER — SODIUM CHLORIDE 0.9 % IV SOLN
INTRAVENOUS | Status: DC
  Administered 2015-09-19: 11:00:00 via INTRAVENOUS

## 2015-09-19 MED ORDER — SUCCINYLCHOLINE CHLORIDE 20 MG/ML IJ SOLN
INTRAMUSCULAR | Status: DC | PRN
  Administered 2015-09-19: 10 mg via INTRAVENOUS
  Administered 2015-09-19: 100 mg via INTRAVENOUS

## 2015-09-19 MED ORDER — POTASSIUM CHLORIDE IN NACL 20-0.9 MEQ/L-% IV SOLN: INTRAVENOUS | Status: DC

## 2015-09-19 MED ORDER — ROPIVACAINE HCL 5 MG/ML IJ SOLN
INTRAMUSCULAR | Status: AC
  Filled 2015-09-19: qty 40

## 2015-09-19 MED ORDER — CEFAZOLIN SODIUM-DEXTROSE 2-3 GM-% IV SOLR
INTRAVENOUS | Status: AC
  Filled 2015-09-19: qty 50

## 2015-09-19 MED ORDER — ONDANSETRON HCL 4 MG/2ML IJ SOLN: 4.0000 mg | Freq: Once | INTRAMUSCULAR | Status: DC | PRN

## 2015-09-19 MED ORDER — METOCLOPRAMIDE HCL 10 MG PO TABS: 5.0000 mg | ORAL_TABLET | Freq: Three times a day (TID) | ORAL | Status: DC | PRN

## 2015-09-19 MED ORDER — BUPIVACAINE-EPINEPHRINE (PF) 0.5% -1:200000 IJ SOLN
INTRAMUSCULAR | Status: AC
  Filled 2015-09-19: qty 30

## 2015-09-19 MED ORDER — ACETAMINOPHEN 10 MG/ML IV SOLN
INTRAVENOUS | Status: AC
  Filled 2015-09-19: qty 100

## 2015-09-19 MED ORDER — NITROGLYCERIN 0.4 MG SL SUBL
0.4000 mg | SUBLINGUAL_TABLET | SUBLINGUAL | Status: DC | PRN
  Administered 2015-09-19: 0.4 mg via SUBLINGUAL

## 2015-09-19 MED ORDER — MIDAZOLAM HCL 2 MG/2ML IJ SOLN
1.0000 mg | Freq: Once | INTRAMUSCULAR | Status: AC
  Administered 2015-09-19: 1 mg via INTRAVENOUS

## 2015-09-19 MED ORDER — EPINEPHRINE HCL 1 MG/ML IJ SOLN
INTRAMUSCULAR | Status: DC | PRN
  Administered 2015-09-19: 2 mL

## 2015-09-19 MED ORDER — ONDANSETRON HCL 4 MG PO TABS: 4.0000 mg | ORAL_TABLET | Freq: Four times a day (QID) | ORAL | Status: DC | PRN

## 2015-09-19 MED ORDER — FENTANYL CITRATE (PF) 100 MCG/2ML IJ SOLN
INTRAMUSCULAR | Status: AC
  Filled 2015-09-19: qty 2

## 2015-09-19 MED ORDER — LIDOCAINE HCL (PF) 1 % IJ SOLN
2.0000 mL | Freq: Once | INTRAMUSCULAR | Status: AC
  Administered 2015-09-19: 2 mL via INTRADERMAL

## 2015-09-19 MED ORDER — MIDAZOLAM HCL 5 MG/5ML IJ SOLN
INTRAMUSCULAR | Status: AC
  Filled 2015-09-19: qty 5

## 2015-09-19 MED ORDER — BUPIVACAINE-EPINEPHRINE 0.5% -1:200000 IJ SOLN
INTRAMUSCULAR | Status: DC | PRN
  Administered 2015-09-19: 20 mL

## 2015-09-19 MED ORDER — PROPOFOL 10 MG/ML IV BOLUS
INTRAVENOUS | Status: DC | PRN
  Administered 2015-09-19: 150 mg via INTRAVENOUS

## 2015-09-19 MED ORDER — FENTANYL CITRATE (PF) 100 MCG/2ML IJ SOLN: 25.0000 ug | INTRAMUSCULAR | Status: DC | PRN

## 2015-09-19 MED ORDER — FENTANYL CITRATE (PF) 100 MCG/2ML IJ SOLN
50.0000 ug | Freq: Once | INTRAMUSCULAR | Status: AC
  Administered 2015-09-19: 50 ug via INTRAVENOUS

## 2015-09-19 MED ORDER — EPINEPHRINE HCL 1 MG/ML IJ SOLN
INTRAMUSCULAR | Status: AC
  Filled 2015-09-19: qty 2

## 2015-09-19 MED ORDER — ONDANSETRON HCL 4 MG/2ML IJ SOLN: 4.0000 mg | Freq: Four times a day (QID) | INTRAMUSCULAR | Status: DC | PRN

## 2015-09-19 MED ORDER — LIDOCAINE HCL (CARDIAC) 20 MG/ML IV SOLN
INTRAVENOUS | Status: DC | PRN
  Administered 2015-09-19: 30 mg via INTRAVENOUS

## 2015-09-19 MED ORDER — LIDOCAINE HCL 2 % IJ SOLN: 0.1000 mL | Freq: Once | INTRAMUSCULAR | Status: DC

## 2015-09-19 MED ORDER — ROCURONIUM BROMIDE 100 MG/10ML IV SOLN
INTRAVENOUS | Status: DC | PRN
  Administered 2015-09-19 (×2): 10 mg via INTRAVENOUS

## 2015-09-19 SURGICAL SUPPLY — 47 items
BIT DRILL JUGRKNT W/NDL BIT2.9 (DRILL) IMPLANT
BLADE FULL RADIUS 3.5 (BLADE) ×3 IMPLANT
BLADE SHAVER 4.5X7 STR FR (MISCELLANEOUS) IMPLANT
BUR ACROMIONIZER 4.0 (BURR) ×3 IMPLANT
BUR BR 5.5 WIDE MOUTH (BURR) IMPLANT
CANNULA 8.5X75 THRED (CANNULA) IMPLANT
CANNULA SHAVER 8MMX76MM (CANNULA) ×3 IMPLANT
CHLORAPREP W/TINT 26ML (MISCELLANEOUS) ×6 IMPLANT
COVER MAYO STAND STRL (DRAPES) ×3 IMPLANT
DRAPE IMP U-DRAPE 54X76 (DRAPES) ×3 IMPLANT
DRAPE SURG 17X11 SM STRL (DRAPES) ×3 IMPLANT
DRILL JUGGERKNOT W/NDL BIT 2.9 (DRILL)
DRSG OPSITE POSTOP 3X4 (GAUZE/BANDAGES/DRESSINGS) ×9 IMPLANT
DRSG OPSITE POSTOP 4X8 (GAUZE/BANDAGES/DRESSINGS) IMPLANT
GAUZE PETRO XEROFOAM 1X8 (MISCELLANEOUS) IMPLANT
GAUZE SPONGE 4X4 12PLY STRL (GAUZE/BANDAGES/DRESSINGS) ×3 IMPLANT
GLOVE BIO SURGEON STRL SZ7.5 (GLOVE) ×6 IMPLANT
GLOVE BIO SURGEON STRL SZ8 (GLOVE) ×6 IMPLANT
GLOVE BIOGEL PI IND STRL 8 (GLOVE) ×1 IMPLANT
GLOVE BIOGEL PI INDICATOR 8 (GLOVE) ×2
GLOVE INDICATOR 8.0 STRL GRN (GLOVE) ×3 IMPLANT
GOWN STRL REUS W/ TWL LRG LVL3 (GOWN DISPOSABLE) ×2 IMPLANT
GOWN STRL REUS W/ TWL XL LVL3 (GOWN DISPOSABLE) ×1 IMPLANT
GOWN STRL REUS W/TWL LRG LVL3 (GOWN DISPOSABLE) ×4
GOWN STRL REUS W/TWL XL LVL3 (GOWN DISPOSABLE) ×2
GRASPER SUT 15 45D LOW PRO (SUTURE) IMPLANT
IV LACTATED RINGER IRRG 3000ML (IV SOLUTION) ×4
IV LR IRRIG 3000ML ARTHROMATIC (IV SOLUTION) ×2 IMPLANT
MANIFOLD NEPTUNE II (INSTRUMENTS) ×3 IMPLANT
MASK FACE SPIDER DISP (MASK) ×3 IMPLANT
MAT BLUE FLOOR 46X72 FLO (MISCELLANEOUS) ×3 IMPLANT
NEEDLE REVERSE CUT 1/2 CRC (NEEDLE) IMPLANT
PACK ARTHROSCOPY SHOULDER (MISCELLANEOUS) ×3 IMPLANT
PAD GROUND ADULT SPLIT (MISCELLANEOUS) ×3 IMPLANT
SLING ARM LRG DEEP (SOFTGOODS) ×3 IMPLANT
SLING ULTRA II LG (MISCELLANEOUS) IMPLANT
STAPLER SKIN PROX 35W (STAPLE) ×3 IMPLANT
STRAP SAFETY BODY (MISCELLANEOUS) ×6 IMPLANT
SUT ETHIBOND 0 MO6 C/R (SUTURE) ×3 IMPLANT
SUT PROLENE 4 0 PS 2 18 (SUTURE) ×3 IMPLANT
SUT VIC AB 2-0 CT1 27 (SUTURE) ×4
SUT VIC AB 2-0 CT1 TAPERPNT 27 (SUTURE) ×2 IMPLANT
TAPE MICROFOAM 4IN (TAPE) IMPLANT
TUBING ARTHRO INFLOW-ONLY STRL (TUBING) ×3 IMPLANT
TUBING CONNECTING 10 (TUBING) ×2 IMPLANT
TUBING CONNECTING 10' (TUBING) ×1
WAND HAND CNTRL MULTIVAC 90 (MISCELLANEOUS) ×3 IMPLANT

## 2015-09-19 NOTE — Anesthesia Preprocedure Evaluation (Signed)
Anesthesia Evaluation  Patient identified by MRN, date of birth, ID band Patient awake    Reviewed: Allergy & Precautions, NPO status , Patient's Chart, lab work & pertinent test results  History of Anesthesia Complications Negative for: history of anesthetic complications  Airway Mallampati: III       Dental  (+) Teeth Intact   Pulmonary COPD (mild, no inhalers), Current Smoker,           Cardiovascular hypertension, Pt. on medications + CAD, + Past MI and + Cardiac Stents       Neuro/Psych CVA (L hand still weak), Residual Symptoms    GI/Hepatic Neg liver ROS, GERD  Medicated and Controlled,  Endo/Other  negative endocrine ROS  Renal/GU Dialysis and ESRFRenal disease     Musculoskeletal   Abdominal   Peds  Hematology  (+) anemia ,   Anesthesia Other Findings   Reproductive/Obstetrics                             Anesthesia Physical Anesthesia Plan  ASA: III  Anesthesia Plan: General and Regional   Post-op Pain Management: GA combined w/ Regional for post-op pain   Induction: Intravenous  Airway Management Planned: Oral ETT  Additional Equipment:   Intra-op Plan:   Post-operative Plan:   Informed Consent: I have reviewed the patients History and Physical, chart, labs and discussed the procedure including the risks, benefits and alternatives for the proposed anesthesia with the patient or authorized representative who has indicated his/her understanding and acceptance.     Plan Discussed with:   Anesthesia Plan Comments:         Anesthesia Quick Evaluation

## 2015-09-19 NOTE — Anesthesia Procedure Notes (Addendum)
Procedure Name: Intubation Date/Time: 09/19/2015 12:35 PM Performed by: Delaney Meigs Pre-anesthesia Checklist: Patient identified, Emergency Drugs available, Suction available, Patient being monitored and Timeout performed Patient Re-evaluated:Patient Re-evaluated prior to inductionOxygen Delivery Method: Circle system utilized Preoxygenation: Pre-oxygenation with 100% oxygen Intubation Type: IV induction Ventilation: Mask ventilation without difficulty Laryngoscope Size: Mac and 3 Tube type: Oral Tube size: 7.5 mm Number of attempts: 1 Airway Equipment and Method: Stylet Placement Confirmation: ETT inserted through vocal cords under direct vision,  positive ETCO2 and breath sounds checked- equal and bilateral Secured at: 22 cm Tube secured with: Tape Dental Injury: Teeth and Oropharynx as per pre-operative assessment    Anesthesia Regional Block:  Interscalene brachial plexus block  Pre-Anesthetic Checklist: ,, timeout performed, Correct Patient, Correct Site, Correct Laterality, Correct Procedure, Correct Position, site marked, Risks and benefits discussed,  Surgical consent,  Pre-op evaluation,  At surgeon's request and post-op pain management  Laterality: Left  Prep: alcohol swabs       Needles:  Injection technique: Single-shot  Needle Type: Stimiplex     Needle Length: 5cm 5 cm Needle Gauge: 22 and 22 G  Needle insertion depth: 4 cm   Additional Needles:  Procedures: ultrasound guided (picture in chart) and nerve stimulator Interscalene brachial plexus block  Nerve Stimulator or Paresthesia:  Response: biceps flexion, 0.6 mA,   Additional Responses:   Narrative:  Injection made incrementally with aspirations every 5 mL.  Performed by: Personally   Additional Notes: Functioning IV was confirmed and monitors were applied.  A 60mm 22ga Stimuplex needle was used. Sterile prep and drape,hand hygiene and sterile gloves were used.  Negative aspiration and negative  test dose prior to incremental administration of local anesthetic. The patient tolerated the procedure well.

## 2015-09-19 NOTE — Anesthesia Postprocedure Evaluation (Signed)
  Anesthesia Post-op Note  Patient: Frank Huang  Procedure(s) Performed: Procedure(s): SHOULDER ARTHROSCOPY , subacromial decompression, debridement (Left)  Anesthesia type:General, Regional  Patient location: PACU  Post pain: Pain level controlled  Post assessment: Post-op Vital signs reviewed, Patient's Cardiovascular Status Stable, Respiratory Function Stable, Patent Airway and No signs of Nausea or vomiting  Post vital signs: Reviewed and stable  Last Vitals:  Filed Vitals:   09/19/15 1345  BP:   Pulse:   Temp: 36.2 C  Resp:     Level of consciousness: awake, alert  and patient cooperative  Complications: No apparent anesthesia complications

## 2015-09-19 NOTE — Consult Note (Signed)
[facility] NAME: Frank Huang    MR#:  [id number]  DATE OF BIRTH:  July 02, 1932  DATE OF CONSULT:  09/19/2015  PRIMARY CARE PHYSICIAN: Wilhemena Durie, MD   REQUESTING/REFERRING PHYSICIAN: Dr. Ronelle Nigh  CHIEF COMPLAINT:  No chief complaint on file.  chest pain  HISTORY OF PRESENT ILLNESS:  Maleik Vanderzee  is a 81 y.o. male with a known history of end-stage renal disease on hemodialysis, multiple myeloma, COPD, neuropathy, GERD, history of previous CVA, history of coronary disease, history of previous MI who presented to the hospital for left rotator cuff surgery and postoperatively in the PACU patient was noted to have some chest pain. He describes the chest pain as being pressure in nature located in the center of his chest nonradiating and not associated with any shortness of breath, diaphoresis, palpitations or any other associated symptoms. Patient was given sublingual nitroglycerin and his chest pain since then is improved and resolved. Patient's EKG showed no evidence of acute ST or T-wave changes and clinically he is currently chest pain-free and hemodynamically stable. Hospitalist services were contacted further treatment and evaluation.  PAST MEDICAL HISTORY:   Past Medical History  Diagnosis Date  . Chronic kidney disease   . Cancer ([place])   . Multiple myeloma ([place])   . COPD (chronic obstructive pulmonary disease) ([place])   . Shortness of breath dyspnea   . Neuropathy ([place])   . GERD (gastroesophageal reflux disease)   . Stroke (cerebrum) (HCC)     weakness Lt hand  . Coronary artery disease   . Myocardial infarction [facility]) 2005    PAST SURGICAL HISTOIRY:   Past Surgical History  Procedure Laterality Date  . Total hip arthroplasty Left   . Appendectomy    . Cholecystectomy    . Av fistula placement Left     SOCIAL HISTORY:   Social History  Substance Use Topics  . Smoking status: Current Every Day Smoker -- 0.25  packs/day    Types: Cigarettes  . Smokeless tobacco: Not on file     Comment: smokes about 3 cigarettes per week  . Alcohol Use: No    FAMILY HISTORY:   Family History  Problem Relation Age of Onset  . Alzheimer's disease Mother   . Kidney failure Father   . Bone cancer Sister   . Stomach cancer Sister     DRUG ALLERGIES:  No Known Allergies  REVIEW OF SYSTEMS:   Review of Systems  Constitutional: Negative for fever and weight loss.  HENT: Negative for congestion, nosebleeds and tinnitus.   Eyes: Negative for blurred vision, double vision and redness.  Respiratory: Negative for cough, hemoptysis and shortness of breath.   Cardiovascular: Negative for chest pain, orthopnea, leg swelling and PND.  Gastrointestinal: Negative for nausea, vomiting, abdominal pain, diarrhea and melena.  Genitourinary: Negative for dysuria, urgency and hematuria.  Musculoskeletal: Negative for joint pain and falls.  Neurological: Negative for dizziness, tingling, sensory change, focal weakness, seizures, weakness and headaches.  Endo/Heme/Allergies: Negative for polydipsia. Does not bruise/bleed easily.  Psychiatric/Behavioral: Negative for depression and memory loss. The patient is not nervous/anxious.      MEDICATIONS AT HOME:   Prior to Admission medications   Medication Sig Start Date End Date Taking? Authorizing Provider  donepezil (ARICEPT) 5 MG tablet Take 1 tablet (5 mg total) by mouth at bedtime. 08/29/15  Yes Richard Maceo Pro., MD  lovastatin (MEVACOR) 40 MG tablet TAKE 1 TABLET  BY MOUTH EVERY DAY FOR HIGH CHOLESTEROL 08/10/14  Yes Historical Provider, MD  omeprazole (PRILOSEC) 20 MG capsule Take 20 mg by mouth daily. TAKE 1 CAPSULE BY MOUTH EVERY DAY. 11/15/14  Yes Historical Provider, MD  aspirin EC 81 MG tablet Take 81 mg by mouth daily.    Historical Provider, MD  Multiple Vitamin (MULTI-VITAMINS) TABS Take by mouth.    Historical Provider, MD  oxyCODONE (ROXICODONE) 5 MG  immediate release tablet Take 1-2 tablets (5-10 mg total) by mouth every 4 (four) hours as needed for severe pain. 09/19/15   Corky Mull, MD      VITAL SIGNS:  Blood pressure 135/56, pulse 71, temperature 97.1 F (36.2 C), temperature source Oral, resp. rate 16, height 6' 3" (1.905 m), weight 87.998 kg (194 lb), SpO2 100 %.  PHYSICAL EXAMINATION:  GENERAL:  81 y.o.-year-old patient lying in the bed with no acute distress.  EYES: Pupils equal, round, reactive to light and accommodation. No scleral icterus. Extraocular muscles intact.  HEENT: Head atraumatic, normocephalic. Oropharynx and nasopharynx clear.  NECK:  Supple, no jugular venous distention. No thyroid enlargement, no tenderness.  LUNGS: Normal breath sounds bilaterally, no wheezing, rales, rhonchi . No use of accessory muscles of respiration.  CARDIOVASCULAR: S1, S2, RRR. No murmurs, rubs, gallops, clicks.  ABDOMEN: Soft, nontender, nondistended. Bowel sounds present. No organomegaly or mass.  EXTREMITIES: No pedal edema, cyanosis, or clubbing. Left shoulder dressing from rotator cuff surgery. NEUROLOGIC: Cranial nerves II through XII are intact. No focal motor or sensory deficits appreciated bilaterally  PSYCHIATRIC: The patient is alert and oriented x 3. Good affect SKIN: No obvious rash, lesion, or ulcer.   LABORATORY PANEL:   CBC  Recent Labs Lab 09/15/15 1325 09/19/15 1128  WBC 4.7  --   HGB 12.0* 13.3  HCT 35.0* 39.0  PLT 123*  --    ------------------------------------------------------------------------------------------------------------------  Chemistries   Recent Labs Lab 09/15/15 1325 09/19/15 1128  NA 140 140  K 3.5 3.8  CL 97*  --   CO2 34*  --   GLUCOSE 90 81  BUN 13  --   CREATININE 2.73*  --   CALCIUM 8.8*  --    ------------------------------------------------------------------------------------------------------------------  Cardiac Enzymes No results for input(s): TROPONINI in the  last 168 hours. ------------------------------------------------------------------------------------------------------------------  RADIOLOGY:  No results found.   IMPRESSION AND PLAN:   81 year old male with past medical history of multiple myeloma, end-stage renal disease on hemodialysis, history of coronary disease, history of previous MI, history of previous CVA, GERD, COPD who presented to the hospital for a left rotator cuff surgical repair and postoperatively noted to have chest pain.  #1 chest pain-patient does have risk factors for coronary disease given his previous history of CAD, end-stage renal disease. -His chest pain is also atypical and resolved with some sublingual nitroglycerin. His EKG does not show any evidence of acute ST or T-wave changes. He is clinically now chest pain-free and hemodynamically stable. -I will check a stat troponin and if negative patient can be discharged home and follow up with his cardiologist as an outpatient.  #2 end-stage renal disease on hemodialysis-patient will continue his scheduled dialysis on Monday Wednesday and Friday.  #3 COPD-no evidence of acute exacerbation continue maintenance meds.  #4 GERD-continue omeprazole.  #5 history of CAD-continue aspirin and statin  If cardiac markers are negative the patient can likely be discharged home.  All the records are reviewed and case discussed with Consulting provider. Management plans discussed with the  patient, family and they are in agreement.  CODE STATUS: Full  TOTAL TIME TAKING CARE OF THIS PATIENT: 45 minutes.    Henreitta Leber M.D on 09/19/2015 at 3:06 PM  Between 7am to 6pm - Pager - [phone]  After 6pm go to [email or web address] - password [email or web address] [facility]  Office  [phone]  CC: Primary care Physician: Wilhemena Durie, MD

## 2015-09-19 NOTE — H&P (Signed)
Paper H&P to be scanned into permanent record. H&P reviewed. No changes. 

## 2015-09-19 NOTE — Transfer of Care (Signed)
Immediate Anesthesia Transfer of Care Note  Patient: Frank Huang  Procedure(s) Performed: Procedure(s): SHOULDER ARTHROSCOPY , subacromial decompression, debridement (Left)  Patient Location: PACU  Anesthesia Type:GA combined with regional for post-op pain  Level of Consciousness: sedated  Airway & Oxygen Therapy: Patient Spontanous Breathing and Patient connected to face mask oxygen  Post-op Assessment: Report given to RN and Post -op Vital signs reviewed and stable  Post vital signs: Reviewed and stable  Last Vitals: 13:45 100% sat 170/90 93hr 20 resp  Filed Vitals:   09/19/15 1345  BP:   Pulse:   Temp: 36.2 C  Resp:     Complications: No apparent anesthesia complications

## 2015-09-19 NOTE — Op Note (Signed)
09/19/2015  1:33 PM  Patient:   Frank Huang  Pre-Op Diagnosis:   Impingement/tendinopathy, left shoulder.  Postoperative diagnosis: Impingement/tendinopathy with labral fraying, left shoulder.  Procedure: Arthroscopic labral debridement and arthroscopic subacromial decompression, left shoulder.  Anesthesia: General endotracheal with interscalene block placed preoperatively by the anesthesiologist.  Surgeon:   Pascal Lux, MD  Assistant:   None  Findings: As above. The rotator cuff was in excellent condition, as was the biceps tendon. There was moderate labral fraying anteriorly and superiorly without frank detachment of the labrum. There were grade 2 chondromalacial changes diffusely involving the glenoid and humeral head.  Complications: None  Fluids:   200 cc  Estimated blood loss: 3 cc  Tourniquet time: None  Drains: None  Closure: Staples   Brief clinical note: The patient is an 81 year old male with a prolonged history of left shoulder pain without any apparent injury. The patient's symptoms have progressed despite medications, activity modification, etc. The patient's history and examination are consistent with impingement/tendinopathy without any rotator cuff tear. These findings were confirmed by MRI scan. The patient presents at this time for definitive management of these shoulder symptoms.  Procedure: The patient underwent placement of an interscalene block in the preoperative holding area before he was brought into the operating room and lain in the supine position. After adequate general endotracheal intubation and anesthesia was obtained, the patient was repositioned in the beach chair position using the beach chair positioner. The left shoulder and upper extremity were prepped with ChloraPrep solution before being draped sterilely. Preoperative antibiotics were administered. A timeout was performed to confirm the proper surgical site before  the expected portal sites and incision site were injected with 0.5% Sensorcaine with epinephrine. A posterior portal was created and the glenohumeral joint thoroughly inspected with the findings as described above. An anterior portal was created using an outside-in technique. The labrum and rotator cuff were further probed, again confirming the above-noted findings. The full radius resector was introduced and used to debride the frayed portions of the labrum anteriorly and superiorly. The ArthroCare wand was inserted and used to obtain hemostasis as well as to "anneal" the labrum superiorly and anteriorly. The instruments were removed from the joint after suctioning the excess fluid.  The camera was repositioned through the posterior portal into the subacromial space. A separate lateral portal was created using an outside-in technique. The 3.5 mm full-radius resector was introduced and used to perform a subtotal bursectomy. The ArthroCare wand was then inserted and used to remove the periosteal tissue off the undersurface of the anterior third of the acromion as well as to recess the coracoacromial ligament from its attachment along the anterior and lateral margins of the acromion. The 4.0 mm acromionizing bur was introduced and used to complete the decompression by removing the undersurface of the anterior third of the acromion. The full radius resector was reintroduced to remove any residual bony debris before the ArthroCare wand was reintroduced to obtain hemostasis. The instruments were then removed from the subacromial space after suctioning the excess fluid.  The portal sites were closed using staples. A sterile occlusive dressing was applied to the shoulder before the arm was placed into a shoulder sling. The patient was then awakened, extubated, and returned to the recovery room in satisfactory condition after tolerating the procedure well.

## 2015-09-19 NOTE — Discharge Instructions (Addendum)
Keep dressing dry and intact.  May shower after dressing changed on post-op day #4 (Saturday).  Cover staples with Band-Aids after drying off. Apply ice frequently to shoulder. Keep shoulder immobilizer on at all times except may remove for bathing purposes. Follow-up in 10-14 days or as scheduled.AMBULATORY SURGERY  DISCHARGE INSTRUCTIONS   1) The drugs that you were given will stay in your system until tomorrow so for the next 24 hours you should not:  A) Drive an automobile B) Make any legal decisions C) Drink any alcoholic beverage   2) You may resume regular meals tomorrow.  Today it is better to start with liquids and gradually work up to solid foods.  You may eat anything you prefer, but it is better to start with liquids, then soup and crackers, and gradually work up to solid foods.   3) Please notify your doctor immediately if you have any unusual bleeding, trouble breathing, redness and pain at the surgery site, drainage, fever, or pain not relieved by medication.    4) Additional Instructions:        Please contact your physician with any problems or Same Day Surgery at [phone], Monday through Friday 6 am to 4 pm, or [place] at [facility] number at [phone].AMBULATORY SURGERY  DISCHARGE INSTRUCTIONS   5) The drugs that you were given will stay in your system until tomorrow so for the next 24 hours you should not:  D) Drive an automobile E) Make any legal decisions F) Drink any alcoholic beverage   6) You may resume regular meals tomorrow.  Today it is better to start with liquids and gradually work up to solid foods.  You may eat anything you prefer, but it is better to start with liquids, then soup and crackers, and gradually work up to solid foods.   7) Please notify your doctor immediately if you have any unusual bleeding, trouble breathing, redness and pain at the surgery site, drainage, fever, or pain not relieved by  medication.    8) Additional Instructions:        Please contact your physician with any problems or Same Day Surgery at [phone], Monday through Friday 6 am to 4 pm, or [place] at [facility] number at [phone].

## 2015-09-20 ENCOUNTER — Inpatient Hospital Stay: Payer: Medicare Other | Attending: Hematology and Oncology

## 2015-09-20 ENCOUNTER — Encounter: Payer: Self-pay | Admitting: Surgery

## 2015-09-20 DIAGNOSIS — N186 End stage renal disease: Secondary | ICD-10-CM | POA: Diagnosis not present

## 2015-09-20 DIAGNOSIS — K219 Gastro-esophageal reflux disease without esophagitis: Secondary | ICD-10-CM | POA: Insufficient documentation

## 2015-09-20 DIAGNOSIS — M1990 Unspecified osteoarthritis, unspecified site: Secondary | ICD-10-CM | POA: Insufficient documentation

## 2015-09-20 DIAGNOSIS — F17210 Nicotine dependence, cigarettes, uncomplicated: Secondary | ICD-10-CM | POA: Insufficient documentation

## 2015-09-20 DIAGNOSIS — I2510 Atherosclerotic heart disease of native coronary artery without angina pectoris: Secondary | ICD-10-CM | POA: Insufficient documentation

## 2015-09-20 DIAGNOSIS — I252 Old myocardial infarction: Secondary | ICD-10-CM | POA: Diagnosis not present

## 2015-09-20 DIAGNOSIS — C9000 Multiple myeloma not having achieved remission: Secondary | ICD-10-CM | POA: Insufficient documentation

## 2015-09-20 DIAGNOSIS — M779 Enthesopathy, unspecified: Secondary | ICD-10-CM | POA: Insufficient documentation

## 2015-09-20 DIAGNOSIS — J449 Chronic obstructive pulmonary disease, unspecified: Secondary | ICD-10-CM | POA: Diagnosis not present

## 2015-09-20 DIAGNOSIS — Z79899 Other long term (current) drug therapy: Secondary | ICD-10-CM | POA: Diagnosis not present

## 2015-09-20 DIAGNOSIS — Z888 Allergy status to other drugs, medicaments and biological substances status: Secondary | ICD-10-CM | POA: Insufficient documentation

## 2015-09-20 DIAGNOSIS — Z8673 Personal history of transient ischemic attack (TIA), and cerebral infarction without residual deficits: Secondary | ICD-10-CM | POA: Insufficient documentation

## 2015-09-20 DIAGNOSIS — E538 Deficiency of other specified B group vitamins: Secondary | ICD-10-CM | POA: Diagnosis not present

## 2015-09-20 DIAGNOSIS — Z7982 Long term (current) use of aspirin: Secondary | ICD-10-CM | POA: Insufficient documentation

## 2015-09-20 DIAGNOSIS — D649 Anemia, unspecified: Secondary | ICD-10-CM | POA: Diagnosis not present

## 2015-09-20 DIAGNOSIS — Z992 Dependence on renal dialysis: Secondary | ICD-10-CM | POA: Diagnosis not present

## 2015-09-20 DIAGNOSIS — E611 Iron deficiency: Secondary | ICD-10-CM | POA: Insufficient documentation

## 2015-09-20 LAB — CBC WITH DIFFERENTIAL/PLATELET
Basophils Absolute: 0 10*3/uL (ref 0–0.1)
Basophils Relative: 1 %
Eosinophils Absolute: 0.1 10*3/uL (ref 0–0.7)
Eosinophils Relative: 3 %
HCT: 35.9 % — ABNORMAL LOW (ref 40.0–52.0)
Hemoglobin: 12 g/dL — ABNORMAL LOW (ref 13.0–18.0)
Lymphocytes Relative: 24 %
Lymphs Abs: 1.4 10*3/uL (ref 1.0–3.6)
MCH: 33.5 pg (ref 26.0–34.0)
MCHC: 33.4 g/dL (ref 32.0–36.0)
MCV: 100.5 fL — ABNORMAL HIGH (ref 80.0–100.0)
Monocytes Absolute: 0.3 10*3/uL (ref 0.2–1.0)
Monocytes Relative: 5 %
Neutro Abs: 3.9 10*3/uL (ref 1.4–6.5)
Neutrophils Relative %: 67 %
Platelets: 126 10*3/uL — ABNORMAL LOW (ref 150–440)
RBC: 3.57 MIL/uL — ABNORMAL LOW (ref 4.40–5.90)
RDW: 13.7 % (ref 11.5–14.5)
WBC: 5.8 10*3/uL (ref 3.8–10.6)

## 2015-09-20 LAB — COMPREHENSIVE METABOLIC PANEL
ALT: 5 U/L — ABNORMAL LOW (ref 17–63)
AST: 16 U/L (ref 15–41)
Albumin: 4 g/dL (ref 3.5–5.0)
Alkaline Phosphatase: 71 U/L (ref 38–126)
Anion gap: 13 (ref 5–15)
BUN: 35 mg/dL — ABNORMAL HIGH (ref 6–20)
CO2: 32 mmol/L (ref 22–32)
Calcium: 9.2 mg/dL (ref 8.9–10.3)
Chloride: 96 mmol/L — ABNORMAL LOW (ref 101–111)
Creatinine, Ser: 4.42 mg/dL — ABNORMAL HIGH (ref 0.61–1.24)
GFR calc Af Amer: 13 mL/min — ABNORMAL LOW (ref >60)
GFR calc non Af Amer: 11 mL/min — ABNORMAL LOW (ref >60)
Glucose, Bld: 94 mg/dL (ref 65–99)
Potassium: 3.8 mmol/L (ref 3.5–5.1)
Sodium: 141 mmol/L (ref 135–145)
Total Bilirubin: 0.5 mg/dL (ref 0.3–1.2)
Total Protein: 7.8 g/dL (ref 6.5–8.1)

## 2015-09-21 LAB — PROTEIN ELECTROPHORESIS, SERUM
A/G Ratio: 1.3 (ref 0.7–1.7)
Albumin ELP: 3.9 g/dL (ref 2.9–4.4)
Alpha-1-Globulin: 0.3 g/dL (ref 0.0–0.4)
Alpha-2-Globulin: 0.7 g/dL (ref 0.4–1.0)
Beta Globulin: 1.7 g/dL — ABNORMAL HIGH (ref 0.7–1.3)
Gamma Globulin: 0.4 g/dL (ref 0.4–1.8)
Globulin, Total: 3 g/dL (ref 2.2–3.9)
M-Spike, %: 0.7 g/dL — ABNORMAL HIGH
Total Protein ELP: 6.9 g/dL (ref 6.0–8.5)

## 2015-09-21 LAB — TROPONIN T: TROPONIN T TROPT: 0.04 ng/mL — AB (ref <0.011)

## 2015-09-21 LAB — KAPPA/LAMBDA LIGHT CHAINS
Kappa free light chain: 1006.5 mg/L — ABNORMAL HIGH (ref 3.30–19.40)
Kappa, lambda light chain ratio: 74.5 — ABNORMAL HIGH (ref 0.26–1.65)
Lambda free light chains: 13.51 mg/L (ref 5.71–26.30)

## 2015-09-22 DIAGNOSIS — N186 End stage renal disease: Secondary | ICD-10-CM | POA: Diagnosis not present

## 2015-09-22 DIAGNOSIS — D631 Anemia in chronic kidney disease: Secondary | ICD-10-CM | POA: Diagnosis not present

## 2015-09-25 DIAGNOSIS — N186 End stage renal disease: Secondary | ICD-10-CM | POA: Diagnosis not present

## 2015-09-25 DIAGNOSIS — D631 Anemia in chronic kidney disease: Secondary | ICD-10-CM | POA: Diagnosis not present

## 2015-09-27 ENCOUNTER — Ambulatory Visit: Payer: Medicare Other | Admitting: Hematology and Oncology

## 2015-09-27 ENCOUNTER — Other Ambulatory Visit: Payer: Self-pay | Admitting: Hematology and Oncology

## 2015-09-27 DIAGNOSIS — N186 End stage renal disease: Secondary | ICD-10-CM | POA: Diagnosis not present

## 2015-09-27 DIAGNOSIS — D631 Anemia in chronic kidney disease: Secondary | ICD-10-CM | POA: Diagnosis not present

## 2015-09-28 DIAGNOSIS — N289 Disorder of kidney and ureter, unspecified: Secondary | ICD-10-CM | POA: Diagnosis not present

## 2015-09-28 DIAGNOSIS — E538 Deficiency of other specified B group vitamins: Secondary | ICD-10-CM | POA: Diagnosis not present

## 2015-09-28 DIAGNOSIS — R413 Other amnesia: Secondary | ICD-10-CM | POA: Diagnosis not present

## 2015-09-29 DIAGNOSIS — N186 End stage renal disease: Secondary | ICD-10-CM | POA: Diagnosis not present

## 2015-09-29 DIAGNOSIS — D631 Anemia in chronic kidney disease: Secondary | ICD-10-CM | POA: Diagnosis not present

## 2015-10-02 ENCOUNTER — Other Ambulatory Visit: Payer: Self-pay | Admitting: Neurology

## 2015-10-02 DIAGNOSIS — N289 Disorder of kidney and ureter, unspecified: Secondary | ICD-10-CM | POA: Diagnosis not present

## 2015-10-02 DIAGNOSIS — R413 Other amnesia: Secondary | ICD-10-CM

## 2015-10-02 DIAGNOSIS — D631 Anemia in chronic kidney disease: Secondary | ICD-10-CM | POA: Diagnosis not present

## 2015-10-02 DIAGNOSIS — N186 End stage renal disease: Secondary | ICD-10-CM | POA: Diagnosis not present

## 2015-10-02 DIAGNOSIS — G629 Polyneuropathy, unspecified: Secondary | ICD-10-CM | POA: Diagnosis not present

## 2015-10-03 ENCOUNTER — Encounter: Payer: Self-pay | Admitting: Family Medicine

## 2015-10-03 ENCOUNTER — Ambulatory Visit (INDEPENDENT_AMBULATORY_CARE_PROVIDER_SITE_OTHER): Payer: Medicare Other | Admitting: Family Medicine

## 2015-10-03 VITALS — BP 112/62 | HR 78 | Temp 97.8°F | Resp 16 | Wt 197.0 lb

## 2015-10-03 DIAGNOSIS — R413 Other amnesia: Secondary | ICD-10-CM | POA: Diagnosis not present

## 2015-10-03 DIAGNOSIS — N185 Chronic kidney disease, stage 5: Secondary | ICD-10-CM | POA: Diagnosis not present

## 2015-10-03 DIAGNOSIS — E538 Deficiency of other specified B group vitamins: Secondary | ICD-10-CM

## 2015-10-03 DIAGNOSIS — G629 Polyneuropathy, unspecified: Secondary | ICD-10-CM

## 2015-10-03 MED ORDER — CYANOCOBALAMIN 1000 MCG/ML IJ SOLN
1000.0000 ug | Freq: Once | INTRAMUSCULAR | Status: AC
  Administered 2015-10-03: 1000 ug via INTRAMUSCULAR

## 2015-10-03 NOTE — Progress Notes (Signed)
Patient ID: Frank Huang, male   DOB: 03/07/1932, 81 y.o.   MRN: [id number]    Subjective:  HPI Per Dr. Manuella Ghazi pt is vitamin b12 def. And needs injections. He is also taking oral b12. Dr. Manuella Ghazi also ordered a MRI of the brain, they would like to know if Dr. Rosanna Randy thinks pt needs this test done.   Prior to Admission medications   Medication Sig Start Date End Date Taking? Authorizing Provider  aspirin EC 81 MG tablet Take 81 mg by mouth daily.    Historical Provider, MD  Cyanocobalamin (VITAMIN B 12 PO) Take by mouth.   Yes Historical Provider, MD  donepezil (ARICEPT) 5 MG tablet Take 1 tablet (5 mg total) by mouth at bedtime. 08/29/15   Richard Maceo Pro., MD  lovastatin (MEVACOR) 40 MG tablet TAKE 1 TABLET BY MOUTH EVERY DAY FOR HIGH CHOLESTEROL 08/10/14   Historical Provider, MD  Multiple Vitamin (MULTI-VITAMINS) TABS Take by mouth.    Historical Provider, MD  omeprazole (PRILOSEC) 20 MG capsule Take 20 mg by mouth daily. TAKE 1 CAPSULE BY MOUTH EVERY DAY. 11/15/14   Historical Provider, MD  oxyCODONE (ROXICODONE) 5 MG immediate release tablet Take 1-2 tablets (5-10 mg total) by mouth every 4 (four) hours as needed for severe pain. 09/19/15   Corky Mull, MD    Patient Active Problem List   Diagnosis Date Noted  . B12 deficiency 07/25/2015  . Deficiency of vitamin B 07/25/2015  . Chest pain 07/01/2015  . Multiple myeloma ([place]) 04/12/2015  . Arteriosclerosis of coronary artery 04/09/2015  . CAFL (chronic airflow limitation) ([place]) 04/09/2015  . Chronic kidney disease requiring chronic dialysis ([place]) 04/09/2015  . Gastro-esophageal reflux disease without esophagitis 04/09/2015  . Gout 04/09/2015  . H/O acute myocardial infarction 04/09/2015  . HLD (hyperlipidemia) 04/09/2015  . BP (high blood pressure) 04/09/2015  . Bad memory 04/09/2015  . 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 Kidney failure 04/09/2015  . End-stage renal disease ([place]) 04/09/2015    . Chronic kidney disease (CKD), stage V ([place]) 07/20/2013  . Chronic kidney disease, stage V ([place]) 07/20/2013  . Absolute anemia 03/31/2013  . Neuropathy ([place]) 03/31/2013    Past Medical History  Diagnosis Date  . Chronic kidney disease   . Cancer ([place])   . Multiple myeloma ([place])   . COPD (chronic obstructive pulmonary disease) ([place])   . Shortness of breath dyspnea   . Neuropathy ([place])   . GERD (gastroesophageal reflux disease)   . Stroke (cerebrum) (HCC)     weakness Lt hand  . Coronary artery disease   . Myocardial infarction [facility]) 2005    Social History   Social History  . Marital Status: Married    Spouse Name: N/A  . Number of Children: N/A  . Years of Education: N/A   Occupational History  . Not on file.   Social History Main Topics  . Smoking status: Current Every Day Smoker -- 0.25 packs/day    Types: Cigarettes  . Smokeless tobacco: Not on file     Comment: smokes about 3 cigarettes per week  . Alcohol Use: No  . Drug Use: No  . Sexual Activity: Not on file   Other Topics Concern  . Not on file   Social History Narrative    No Known Allergies  Review of Systems  Constitutional: Negative.   HENT: Negative.   Eyes: Negative.   Respiratory: Negative.   Cardiovascular: Negative.  Gastrointestinal: Negative.   Genitourinary: Negative.   Musculoskeletal: Negative.   Skin: Negative.   Neurological: Negative.   Endo/Heme/Allergies: Negative.   Psychiatric/Behavioral: Negative.     Immunization History  Administered Date(s) Administered  . Influenza, High Dose Seasonal PF 08/03/2015   Objective:  BP 112/62 mmHg  Pulse 78  Temp(Src) 97.8 F (36.6 C) (Oral)  Resp 16  Wt 197 lb (89.359 kg)  Physical Exam  Constitutional: He is oriented to person, place, and time and well-developed, well-nourished, and in no distress.  HENT:  Head: Normocephalic and atraumatic.  Right Ear: External ear normal.  Left Ear: External ear normal.  Nose: Nose  normal.  Mouth/Throat: Oropharynx is clear and moist.  Eyes: Conjunctivae and EOM are normal. Pupils are equal, round, and reactive to light.  Neck: Normal range of motion. Neck supple.  Cardiovascular: Normal rate, regular rhythm, normal heart sounds and intact distal pulses.   Pulmonary/Chest: Effort normal and breath sounds normal.  Abdominal: Soft. Bowel sounds are normal.  Musculoskeletal: Normal range of motion.  Neurological: He is alert and oriented to person, place, and time. He has normal reflexes. Gait normal. GCS score is 15.  Skin: Skin is warm and dry.  Psychiatric: Mood, memory, affect and judgment normal.    Lab Results  Component Value Date   WBC 5.8 09/20/2015   HGB 12.0* 09/20/2015   HCT 35.9* 09/20/2015   PLT 126* 09/20/2015   GLUCOSE 94 09/20/2015   CHOL 163 08/30/2015   TRIG 78 08/30/2015   HDL 43 08/30/2015   LDLCALC 104* 08/30/2015   TSH 1.080 08/30/2015   INR 1.0 12/11/2013   HGBA1C 4.7* 08/30/2015    CMP     Component Value Date/Time   NA 141 09/20/2015 1127   NA 140 09/29/2014 0848   K 3.8 09/20/2015 1127   K 3.8 09/29/2014 0848   CL 96* 09/20/2015 1127   CL 98 09/29/2014 0848   CO2 32 09/20/2015 1127   CO2 35* 09/29/2014 0848   GLUCOSE 94 09/20/2015 1127   GLUCOSE 117* 09/29/2014 0848   BUN 35* 09/20/2015 1127   BUN 23* 09/29/2014 0848   CREATININE 4.42* 09/20/2015 1127   CREATININE 4.49* 09/29/2014 0848   CALCIUM 9.2 09/20/2015 1127   CALCIUM 9.2 11/22/2014 1042   PROT 7.8 09/20/2015 1127   PROT 7.4 09/29/2014 0848   ALBUMIN 4.0 09/20/2015 1127   ALBUMIN 3.3* 09/29/2014 0848   AST 16 09/20/2015 1127   AST 16 09/29/2014 0848   ALT 5* 09/20/2015 1127   ALT 14 09/29/2014 0848   ALKPHOS 71 09/20/2015 1127   ALKPHOS 91 09/29/2014 0848   BILITOT 0.5 09/20/2015 1127   BILITOT 0.6 09/29/2014 0848   GFRNONAA 11* 09/20/2015 1127   GFRNONAA 14* 09/29/2014 0848   GFRNONAA 19* 02/08/2014 2042   GFRAA 13* 09/20/2015 1127   GFRAA 16*  09/29/2014 0848   GFRAA 22* 02/08/2014 2042    Assessment and Plan :  1. B12 deficiency B12 sohot weekly times 4 then RTC 1 month. - cyanocobalamin ((VITAMIN B-12)) injection 1,000 mcg; Inject 1 mL (1,000 mcg total) into the muscle once.  2. Bad memory/Dementia   3. Chronic kidney disease, stage V (HCC)/Multiple Myeloma   4. Neuropathy ([place])  I have done the exam and reviewed the above chart and it is accurate to the best of my knowledge.  Patient was seen and examined by Dr. Miguel Aschoff, and noted scribed by Webb Laws, [place]  MD [facility] 10/03/2015 4:47 PM

## 2015-10-04 ENCOUNTER — Inpatient Hospital Stay (HOSPITAL_BASED_OUTPATIENT_CLINIC_OR_DEPARTMENT_OTHER): Payer: Medicare Other | Admitting: Hematology and Oncology

## 2015-10-04 VITALS — BP 120/66 | HR 71 | Temp 96.3°F | Wt 194.8 lb

## 2015-10-04 DIAGNOSIS — K219 Gastro-esophageal reflux disease without esophagitis: Secondary | ICD-10-CM | POA: Diagnosis not present

## 2015-10-04 DIAGNOSIS — E611 Iron deficiency: Secondary | ICD-10-CM | POA: Diagnosis not present

## 2015-10-04 DIAGNOSIS — C9000 Multiple myeloma not having achieved remission: Secondary | ICD-10-CM | POA: Diagnosis not present

## 2015-10-04 DIAGNOSIS — J449 Chronic obstructive pulmonary disease, unspecified: Secondary | ICD-10-CM | POA: Diagnosis not present

## 2015-10-04 DIAGNOSIS — F17210 Nicotine dependence, cigarettes, uncomplicated: Secondary | ICD-10-CM

## 2015-10-04 DIAGNOSIS — Z7982 Long term (current) use of aspirin: Secondary | ICD-10-CM

## 2015-10-04 DIAGNOSIS — D649 Anemia, unspecified: Secondary | ICD-10-CM | POA: Diagnosis not present

## 2015-10-04 DIAGNOSIS — D631 Anemia in chronic kidney disease: Secondary | ICD-10-CM

## 2015-10-04 DIAGNOSIS — I252 Old myocardial infarction: Secondary | ICD-10-CM

## 2015-10-04 DIAGNOSIS — N189 Chronic kidney disease, unspecified: Secondary | ICD-10-CM

## 2015-10-04 DIAGNOSIS — I2510 Atherosclerotic heart disease of native coronary artery without angina pectoris: Secondary | ICD-10-CM

## 2015-10-04 DIAGNOSIS — Z992 Dependence on renal dialysis: Secondary | ICD-10-CM | POA: Diagnosis not present

## 2015-10-04 DIAGNOSIS — M1990 Unspecified osteoarthritis, unspecified site: Secondary | ICD-10-CM | POA: Diagnosis not present

## 2015-10-04 DIAGNOSIS — Z79899 Other long term (current) drug therapy: Secondary | ICD-10-CM | POA: Diagnosis not present

## 2015-10-04 DIAGNOSIS — N186 End stage renal disease: Secondary | ICD-10-CM

## 2015-10-04 DIAGNOSIS — Z888 Allergy status to other drugs, medicaments and biological substances status: Secondary | ICD-10-CM | POA: Diagnosis not present

## 2015-10-04 DIAGNOSIS — E538 Deficiency of other specified B group vitamins: Secondary | ICD-10-CM

## 2015-10-04 DIAGNOSIS — Z8673 Personal history of transient ischemic attack (TIA), and cerebral infarction without residual deficits: Secondary | ICD-10-CM | POA: Diagnosis not present

## 2015-10-04 DIAGNOSIS — M779 Enthesopathy, unspecified: Secondary | ICD-10-CM | POA: Diagnosis not present

## 2015-10-04 NOTE — Progress Notes (Signed)
[facility] day:  10/04/2015   Chief Complaint: Frank Huang is a 81 y.o. male with smoldering multiple myeloma who is seen for 3 month assessment.  HPI:  The patient was last seen in the medical oncology clinic on 07/05/2015.  At that time, he was seen for 3 month assessment.  He voiced no new complaints.  He denied any bone pain or interval infections.  He was on dialysis.  He described some issues with his shoulder.  MRI on 06/14/2015 revealed supraspinatus and infraspinatus tendinopathy, moderate acromioclavicular arthritis, and adhesive capsulitis.   At last visit, he was documented with B12 deficiency.    B12 was 120 on 07/05/2015.  Folate was 10.8.  He was preauthorized for B12.  He has not received any B12.  Labs on 06/28/2015 revealed a hematocrit 37.0, hemoglobin 12.5, MCV 97.5, platelets 110,000, white count 4300 with an ANC of 2700.  Serum protein electrophoresis revealed an M spike of 0.9 gm/dL.  Kappa free light light chains were 851.66, lambda free light chains 13.34, and ratio of 63.84 (high).  Calcium was 9.4.  Symptomatically, he feels "fine".  He denies any symptoms.   Past Medical History  Diagnosis Date  . Chronic kidney disease   . Cancer ([place])   . Multiple myeloma ([place])   . COPD (chronic obstructive pulmonary disease) ([place])   . Shortness of breath dyspnea   . Neuropathy ([place])   . GERD (gastroesophageal reflux disease)   . Stroke (cerebrum) ([facility])     weakness Lt hand  . Coronary artery disease   . Myocardial infarction [facility]) 2005    Past Surgical History  Procedure Laterality Date  . Total hip arthroplasty Left   . Appendectomy    . Cholecystectomy    . Av fistula placement Left   . Shoulder arthroscopy with open rotator cuff repair Left 09/19/2015    Procedure: SHOULDER ARTHROSCOPY , subacromial decompression, debridement;  Surgeon: Corky Mull, MD;  Location: [facility] ORS;  Service: Orthopedics;  Laterality: Left;     Family History  Problem Relation Age of Onset  . Alzheimer's disease Mother   . Kidney failure Father   . Bone cancer Sister   . Stomach cancer Sister     Social History:  reports that he has been smoking Cigarettes.  He has been smoking about 0.25 packs per day. He does not have any smokeless tobacco history on file. He reports that he does not drink alcohol or use illicit drugs.  The patient is accompanied by his wife today.  Allergies: No Known Allergies  Current Medications: Current Outpatient Prescriptions  Medication Sig Dispense Refill  . aspirin EC 81 MG tablet Take 81 mg by mouth daily.    . Cyanocobalamin (VITAMIN B 12 PO) Take by mouth.    . donepezil (ARICEPT) 5 MG tablet Take 1 tablet (5 mg total) by mouth at bedtime. 90 tablet 3  . lovastatin (MEVACOR) 40 MG tablet TAKE 1 TABLET BY MOUTH EVERY DAY FOR HIGH CHOLESTEROL    . Multiple Vitamin (MULTI-VITAMINS) TABS Take by mouth.    Marland Kitchen omeprazole (PRILOSEC) 20 MG capsule Take 20 mg by mouth daily. TAKE 1 CAPSULE BY MOUTH EVERY DAY.    Marland Kitchen oxyCODONE (ROXICODONE) 5 MG immediate release tablet Take 1-2 tablets (5-10 mg total) by mouth every 4 (four) hours as needed for severe pain. 50 tablet 0   No current facility-administered medications for this visit.    Review  of Systems:  GENERAL:  Feels "fine".  No fevers, sweats or weight loss. PERFORMANCE STATUS (ECOG):  1 HEENT:  No visual changes, runny nose, sore throat, mouth sores or tenderness. Lungs: No shortness of breath or cough.  No hemoptysis. Cardiac:  No chest pain, palpitations, orthopnea, or PND. GI:  No nausea, vomiting, diarrhea, constipation, melena or hematochezia. GU:  On dialysis.  No urgency, frequency, dysuria, or hematuria. Musculoskeletal:  Left hip discomfort s/p replacement.  No back pain.  No muscle tenderness. Extremities:  No pain or swelling. Skin:  No rashes or skin changes. Neuro:  Left foot numbness. No headache,weakness, balance or  coordination issues. Endocrine:  No diabetes, thyroid issues, hot flashes or night sweats. Psych:  No mood changes, depression or anxiety. Pain:  No focal pain. Review of systems:  All other systems reviewed and found to be negative.  Physical Exam: Blood pressure 120/66, pulse 71, temperature 96.3 F (35.7 C), temperature source Tympanic, weight 194 lb 12.4 oz (88.35 kg). GENERAL:  Well developed, well nourished, gentleman sitting comfortably in the exam room in no acute distress. MENTAL STATUS:  Alert and oriented to person, place and time. HEAD:  Thin gray hair.  Normocephalic, atraumatic, face symmetric, no Cushingoid features. EYES:  Glasses.  Pupils equal round and reactive to light and accomodation.  No conjunctivitis or scleral icterus. ENT:  Oropharynx clear without lesion.  Tongue normal. Mucous membranes moist.  RESPIRATORY:  Clear to auscultation without rales, wheezes or rhonchi. CARDIOVASCULAR:  Regular rate and rhythm without murmur, rub or gallop. ABDOMEN:  Soft, non-tender, with active bowel sounds, and no hepatosplenomegaly.  No masses. SKIN:  No rashes, ulcers or lesions. EXTREMITIES: No edema, no skin discoloration or tenderness.  No palpable cords. LYMPH NODES: No palpable cervical, supraclavicular, axillary or inguinal adenopathy  NEUROLOGICAL: Unremarkable. PSYCH:  Appropriate.  No visits with results within 3 Day(s) from this visit. Latest known visit with results is:  Appointment on 09/20/2015  Component Date Value Ref Range Status  . WBC 09/20/2015 5.8  3.8 - 10.6 K/uL Final  . RBC 09/20/2015 3.57* 4.40 - 5.90 MIL/uL Final  . Hemoglobin 09/20/2015 12.0* 13.0 - 18.0 g/dL Final  . HCT 09/20/2015 35.9* 40.0 - 52.0 % Final  . MCV 09/20/2015 100.5* 80.0 - 100.0 fL Final  . MCH 09/20/2015 33.5  26.0 - 34.0 pg Final  . MCHC 09/20/2015 33.4  32.0 - 36.0 g/dL Final  . RDW 09/20/2015 13.7  11.5 - 14.5 % Final  . Platelets 09/20/2015 126* 150 - 440 K/uL Final  .  Neutrophils Relative % 09/20/2015 67   Final  . Neutro Abs 09/20/2015 3.9  1.4 - 6.5 K/uL Final  . Lymphocytes Relative 09/20/2015 24   Final  . Lymphs Abs 09/20/2015 1.4  1.0 - 3.6 K/uL Final  . Monocytes Relative 09/20/2015 5   Final  . Monocytes Absolute 09/20/2015 0.3  0.2 - 1.0 K/uL Final  . Eosinophils Relative 09/20/2015 3   Final  . Eosinophils Absolute 09/20/2015 0.1  0 - 0.7 K/uL Final  . Basophils Relative 09/20/2015 1   Final  . Basophils Absolute 09/20/2015 0.0  0 - 0.1 K/uL Final  . Sodium 09/20/2015 141  135 - 145 mmol/L Final  . Potassium 09/20/2015 3.8  3.5 - 5.1 mmol/L Final  . Chloride 09/20/2015 96* 101 - 111 mmol/L Final  . CO2 09/20/2015 32  22 - 32 mmol/L Final  . Glucose, Bld 09/20/2015 94  65 - 99 mg/dL Final  .  BUN 09/20/2015 35* 6 - 20 mg/dL Final  . Creatinine, Ser 09/20/2015 4.42* 0.61 - 1.24 mg/dL Final  . Calcium 09/20/2015 9.2  8.9 - 10.3 mg/dL Final  . Total Protein 09/20/2015 7.8  6.5 - 8.1 g/dL Final  . Albumin 09/20/2015 4.0  3.5 - 5.0 g/dL Final  . AST 09/20/2015 16  15 - 41 U/L Final  . ALT 09/20/2015 5* 17 - 63 U/L Final  . Alkaline Phosphatase 09/20/2015 71  38 - 126 U/L Final  . Total Bilirubin 09/20/2015 0.5  0.3 - 1.2 mg/dL Final  . GFR calc non Af Amer 09/20/2015 11* >60 mL/min Final  . GFR calc Af Amer 09/20/2015 13* >60 mL/min Final   Comment: (NOTE) The eGFR has been calculated using the CKD EPI equation. This calculation has not been validated in all clinical situations. eGFR's persistently <60 mL/min signify possible Chronic Kidney Disease.   . Anion gap 09/20/2015 13  5 - 15 Final  . Total Protein ELP 09/20/2015 6.9  6.0 - 8.5 g/dL Final  . Albumin ELP 09/20/2015 3.9  2.9 - 4.4 g/dL Final  . Alpha-1-Globulin 09/20/2015 0.3  0.0 - 0.4 g/dL Final  . Alpha-2-Globulin 09/20/2015 0.7  0.4 - 1.0 g/dL Final  . Beta Globulin 09/20/2015 1.7* 0.7 - 1.3 g/dL Final  . Gamma Globulin 09/20/2015 0.4  0.4 - 1.8 g/dL Final  . M-Spike, %  09/20/2015 0.7* Not Observed g/dL Final  . SPE Interp. 09/20/2015 Comment   Final   Comment: (NOTE) The SPE pattern demonstrates a single peak (M-spike) in the beta region which may represent monoclonal protein. This peak may also be caused by the presence of fibrinogen or circulating immune complexes. If clinically indicated, the presence of a monoclonal gammopathy may be confirmed by immunofixation, as well as evaluation of the urine for the presence of Bence-Jones protein. Performed At: [facility] [place], [place] [id number] Lindon Romp MD PY:[id number]   . Comment 09/20/2015 Comment   Final   Comment: (NOTE) Protein electrophoresis scan will follow via computer, mail, or courier delivery.   Marland Kitchen GLOBULIN, TOTAL 09/20/2015 3.0  2.2 - 3.9 g/dL Corrected  . A/G Ratio 09/20/2015 1.3  0.7 - 1.7 Corrected  . Kappa free light chain 09/20/2015 1006.50* 3.30 - 19.40 mg/L Final  . Lamda free light chains 09/20/2015 13.51  5.71 - 26.30 mg/L Final  . Kappa, lamda light chain ratio 09/20/2015 74.50* 0.26 - 1.65 Final   Comment: (NOTE) Performed At: [facility] [address] [place], [place] [id number] Lindon Romp MD PF:[id number]     Assessment:  BROADUS COSTILLA is a 81 y.o. 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  Bone marrow revealed 20% plasma cells. He was treated with thalidomide for 7 months then discontinued in 06/2005 secondary to rash and depression. He was treated briefly with Revlimid in 2013 which was also discontinued secondary to rash.    M spike was 0.5 gm/dL on 06/15/2013, 0.8 gm/dL on 01/19/2014, 0.5 gm/dL on 11/22/2014, 0.9 on 06/28/2015, and 0.7 on 09/20/2015.  Kappa free light chains were 512 in 06/15/2013, 686 11/02/2013, 929 on 01/19/2014, 1212 in 05/10/2014, 1409 on 11/22/2014, 1119 on 02/28/2015, 851 on 06/28/2015, and 1006 on 09/20/2015.  He has subsequently been followed  off therapy.  He had a hip fracture in in 12/2013.  Per notes, pathology revealed no myeloma.  Bone survey on 01/17/2015 revealed a 9 mm lytic  lesion in the frontoparietal region (previously 7 mm).   He has end-stage renal disease unrelated to his myeloma.  He has been on dialysis since 11/2013.   He has anemia likely due to chronic renal insufficiency.  He has a history of iron deficiency. He had a negative colonoscopy and upper endoscopy in 08/2004. Notes indicate he also had a small bowel capsule study. EGD and colonoscopy in 2014 revealed gastric lesions which were cauterized. He receives IV iron and Procrit with dialysis.  Ferritin is elevated (? acute phase reactant).  Ferritin was 1121 on 07/05/2015.  Iron saturation was 30%.  He has B12 deficiency.  B12 was 120 on 07/05/2015.  Folate was 10.8.   He has had issues with his left shoulder.  MRI on 06/14/2015 revealed a tendinopathy, osteoarthritis, and capsulitis.   Symptomatically, he feels well.  He denies bone pain or infections.  Exam is stable.  Plan: 1. Review labs from 09/20/2015. 2. Re-discuss renal insufficiency.  Review etiology with nephrology secondary to free light chains.  Notes indicate etiology of renal failure secondary to arterionephrosclerosis (no biopsy performed secondary to thin cortex). 3. Discuss B12 deficiency and need for supplimentation.  4. Phone follow-up with Dr. Jannifer Hick [phone]; ext [phone]). 5. Anticipate next bone survey on 01/17/2016. 6. RTC 1 week prior to next appointment for labs (CBC with diff, CMP, SPEP, FLCA, ferritin, ESR). 7. RTC in 3 months for MD assessment and review of labs.    Lequita Asal, MD  10/04/2015, 11:35 AM

## 2015-10-09 DIAGNOSIS — D631 Anemia in chronic kidney disease: Secondary | ICD-10-CM | POA: Diagnosis not present

## 2015-10-09 DIAGNOSIS — N186 End stage renal disease: Secondary | ICD-10-CM | POA: Diagnosis not present

## 2015-10-11 ENCOUNTER — Ambulatory Visit (INDEPENDENT_AMBULATORY_CARE_PROVIDER_SITE_OTHER): Payer: Medicare Other

## 2015-10-11 DIAGNOSIS — Z992 Dependence on renal dialysis: Secondary | ICD-10-CM | POA: Diagnosis not present

## 2015-10-11 DIAGNOSIS — D631 Anemia in chronic kidney disease: Secondary | ICD-10-CM | POA: Diagnosis not present

## 2015-10-11 DIAGNOSIS — N186 End stage renal disease: Secondary | ICD-10-CM | POA: Diagnosis not present

## 2015-10-11 DIAGNOSIS — E538 Deficiency of other specified B group vitamins: Secondary | ICD-10-CM

## 2015-10-11 DIAGNOSIS — I120 Hypertensive chronic kidney disease with stage 5 chronic kidney disease or end stage renal disease: Secondary | ICD-10-CM | POA: Diagnosis not present

## 2015-10-11 MED ORDER — CYANOCOBALAMIN 1000 MCG/ML IJ SOLN
1000.0000 ug | Freq: Once | INTRAMUSCULAR | Status: AC
  Administered 2015-10-11: 1000 ug via INTRAMUSCULAR

## 2015-10-11 NOTE — Progress Notes (Signed)
Patient ID: Frank Huang, male   DOB: December 25, 1931, 81 y.o.   MRN: [id number] Vitamin B12 given today. Patient tolerated injection well. Return in 1 week

## 2015-10-13 DIAGNOSIS — N186 End stage renal disease: Secondary | ICD-10-CM | POA: Diagnosis not present

## 2015-10-13 DIAGNOSIS — D689 Coagulation defect, unspecified: Secondary | ICD-10-CM | POA: Diagnosis not present

## 2015-10-16 DIAGNOSIS — D689 Coagulation defect, unspecified: Secondary | ICD-10-CM | POA: Diagnosis not present

## 2015-10-16 DIAGNOSIS — N186 End stage renal disease: Secondary | ICD-10-CM | POA: Diagnosis not present

## 2015-10-17 DIAGNOSIS — G629 Polyneuropathy, unspecified: Secondary | ICD-10-CM | POA: Diagnosis not present

## 2015-10-17 DIAGNOSIS — Z79899 Other long term (current) drug therapy: Secondary | ICD-10-CM | POA: Diagnosis not present

## 2015-10-17 DIAGNOSIS — J449 Chronic obstructive pulmonary disease, unspecified: Secondary | ICD-10-CM | POA: Diagnosis not present

## 2015-10-17 DIAGNOSIS — R200 Anesthesia of skin: Secondary | ICD-10-CM | POA: Diagnosis not present

## 2015-10-17 DIAGNOSIS — R531 Weakness: Secondary | ICD-10-CM | POA: Diagnosis not present

## 2015-10-17 DIAGNOSIS — Z7982 Long term (current) use of aspirin: Secondary | ICD-10-CM | POA: Diagnosis not present

## 2015-10-17 DIAGNOSIS — I129 Hypertensive chronic kidney disease with stage 1 through stage 4 chronic kidney disease, or unspecified chronic kidney disease: Secondary | ICD-10-CM | POA: Diagnosis not present

## 2015-10-17 DIAGNOSIS — M79606 Pain in leg, unspecified: Secondary | ICD-10-CM | POA: Diagnosis not present

## 2015-10-17 DIAGNOSIS — N184 Chronic kidney disease, stage 4 (severe): Secondary | ICD-10-CM | POA: Diagnosis not present

## 2015-10-17 DIAGNOSIS — I252 Old myocardial infarction: Secondary | ICD-10-CM | POA: Diagnosis not present

## 2015-10-17 DIAGNOSIS — I2510 Atherosclerotic heart disease of native coronary artery without angina pectoris: Secondary | ICD-10-CM | POA: Diagnosis not present

## 2015-10-17 DIAGNOSIS — R0989 Other specified symptoms and signs involving the circulatory and respiratory systems: Secondary | ICD-10-CM | POA: Diagnosis not present

## 2015-10-18 ENCOUNTER — Ambulatory Visit: Payer: Medicare Other

## 2015-10-18 DIAGNOSIS — D689 Coagulation defect, unspecified: Secondary | ICD-10-CM | POA: Diagnosis not present

## 2015-10-18 DIAGNOSIS — N186 End stage renal disease: Secondary | ICD-10-CM | POA: Diagnosis not present

## 2015-10-20 ENCOUNTER — Ambulatory Visit
Admission: RE | Admit: 2015-10-20 | Discharge: 2015-10-20 | Disposition: A | Discharge status: Home / Self Care | Payer: Medicare Other | Source: Ambulatory Visit | Attending: Physician Assistant | Admitting: Physician Assistant

## 2015-10-20 ENCOUNTER — Ambulatory Visit (INDEPENDENT_AMBULATORY_CARE_PROVIDER_SITE_OTHER): Payer: Medicare Other | Admitting: Physician Assistant

## 2015-10-20 ENCOUNTER — Encounter: Payer: Self-pay | Admitting: Physician Assistant

## 2015-10-20 ENCOUNTER — Telehealth: Payer: Self-pay

## 2015-10-20 ENCOUNTER — Ambulatory Visit (INDEPENDENT_AMBULATORY_CARE_PROVIDER_SITE_OTHER): Payer: Medicare Other

## 2015-10-20 VITALS — BP 122/70 | HR 62 | Temp 97.5°F | Resp 16 | Wt 194.6 lb

## 2015-10-20 DIAGNOSIS — C9000 Multiple myeloma not having achieved remission: Secondary | ICD-10-CM | POA: Diagnosis not present

## 2015-10-20 DIAGNOSIS — E538 Deficiency of other specified B group vitamins: Secondary | ICD-10-CM

## 2015-10-20 DIAGNOSIS — Z8639 Personal history of other endocrine, nutritional and metabolic disease: Secondary | ICD-10-CM

## 2015-10-20 DIAGNOSIS — Z8739 Personal history of other diseases of the musculoskeletal system and connective tissue: Secondary | ICD-10-CM

## 2015-10-20 DIAGNOSIS — N186 End stage renal disease: Secondary | ICD-10-CM | POA: Diagnosis not present

## 2015-10-20 DIAGNOSIS — M25561 Pain in right knee: Secondary | ICD-10-CM | POA: Diagnosis not present

## 2015-10-20 DIAGNOSIS — M25562 Pain in left knee: Secondary | ICD-10-CM

## 2015-10-20 DIAGNOSIS — D689 Coagulation defect, unspecified: Secondary | ICD-10-CM | POA: Diagnosis not present

## 2015-10-20 DIAGNOSIS — M7042 Prepatellar bursitis, left knee: Secondary | ICD-10-CM | POA: Diagnosis not present

## 2015-10-20 MED ORDER — CYANOCOBALAMIN 1000 MCG/ML IJ SOLN
1000.0000 ug | Freq: Once | INTRAMUSCULAR | Status: AC
  Administered 2015-10-20: 1000 ug via INTRAMUSCULAR

## 2015-10-20 MED ORDER — NAPROXEN 500 MG PO TABS: 500.0000 mg | ORAL_TABLET | Freq: Two times a day (BID) | ORAL | Status: DC

## 2015-10-20 NOTE — Telephone Encounter (Signed)
-----   Message from Mar Daring, [place] sent at 10/20/2015  3:04 PM EST ----- Normal xray of the left knee with exception of osteoarthritis.  Suspect gout vs prepatellar bursitis of the left knee.  Continue Naprosyn as directed.  May ice knee for 20 minutes approx 3 times daily.  Elevate leg.  Call if symptoms worsen or if develops more swelling, fever, chills, nausea or vomiting.

## 2015-10-20 NOTE — Progress Notes (Signed)
Patient: Frank Huang Male    DOB: January 28, 1932   81 y.o.   MRN: [id number] Visit Date: 10/20/2015  Today's Provider: Mar Daring, PA-C   Chief Complaint  Patient presents with  . Knee Pain   Subjective:    Knee Pain  The incident occurred 6 to 12 hours ago (Woke this morning with the pain. Knee cap is red). There was no injury mechanism. The pain is present in the left knee (Just Knee Cap). The pain is at a severity of 7/10. The pain is moderate. The pain has been constant since onset. Associated symptoms include an inability to bear weight (difficult and painful/not unable). Pertinent negatives include no loss of motion, loss of sensation, muscle weakness, numbness or tingling. He reports no foreign bodies present. The symptoms are aggravated by movement (Walking). He has tried nothing for the symptoms.  He does have a history of gout but this was many years ago. He states that he only ever had gout flares in his big toes. He also has multiple myeloma and is followed by a [facility] oncology. He was most recently seen by them on 10/04/2015. Most recent labs were done November 9 and were stable. He has also underwent a bone scan 04/12/2015 which was unremarkable except for the lytic lesions noted in the parietal bone that had been previously seen but had a slight increase in size.     No Known Allergies Previous Medications   ASPIRIN EC 81 MG TABLET    Take 81 mg by mouth daily.   CYANOCOBALAMIN (VITAMIN B 12 PO)    Take by mouth.   DONEPEZIL (ARICEPT) 5 MG TABLET    Take 1 tablet (5 mg total) by mouth at bedtime.   LOVASTATIN (MEVACOR) 40 MG TABLET    TAKE 1 TABLET BY MOUTH EVERY DAY FOR HIGH CHOLESTEROL   MULTIPLE VITAMIN (MULTI-VITAMINS) TABS    Take by mouth.   OMEPRAZOLE (PRILOSEC) 20 MG CAPSULE    Take 20 mg by mouth daily. TAKE 1 CAPSULE BY MOUTH EVERY DAY.    Review of Systems  Constitutional: Negative.  Negative for fever and chills.  HENT:  Negative.   Respiratory: Negative.   Cardiovascular: Negative.   Gastrointestinal: Negative.   Musculoskeletal: Positive for joint swelling (left knee), arthralgias (left knee) and gait problem (antalgic gait due to pain in L knee with walking).  Skin: Positive for color change (left knee).  Neurological: Negative.  Negative for tingling and numbness.    Social History  Substance Use Topics  . Smoking status: Current Every Day Smoker -- 0.25 packs/day    Types: Cigarettes  . Smokeless tobacco: Not on file     Comment: smok                       es about 3 cigarettes per week  . Alcohol Use: No   Objective:   BP 122/70 mmHg  Pulse 62  Temp(Src) 97.5 F (36.4 C) (Oral)  Resp 16  Wt 194 lb 9.6 oz (88.27 kg)  Physical Exam  Constitutional: He appears well-developed and well-nourished. No distress.  HENT:  Head: Normocephalic and atraumatic.  Musculoskeletal:       Right knee: Normal.       Left knee: He exhibits swelling (very mild), erythema and bony tenderness (directly over patella). He exhibits normal range of motion, no effusion, no deformity, no laceration, normal alignment, no LCL  laxity, normal patellar mobility, normal meniscus and no MCL laxity. No tenderness found.  Skin: He is not diaphoretic.  Vitals reviewed.       Assessment & Plan:     1. Left anterior knee pain Left anterior knee pain started this morning when he first awoke. Does not remember any injury to his left knee. He does have a mildly swollen and erythematous tissue over the left patella. He does not have any pain with his Olin Hauser touching this area but he does have pain with palpation of the left knee. I do feel that this is most likely a prepatellar bursitis versus gout. I will treat with Naprosyn 500 mg to be taken twice daily with food 14 days.. This prescription was sent in to [facility] for him. There was not enough swelling or effusion of the left knee for consideration of aspiration. I will also  check his CBC and uric acid. I will also obtain an x-ray of his left knee to make sure that there is no bony abnormality or possible metastases that could be causing this issue. I did advise him to rest and elevate his knee. He may also place ice over the knee 20 minutes and repeat 3-4 times daily. At this time he is exhibiting no other signs of possible infection so I have not prescribed an antibiotic currently. I did advise him however that if he were to develop increasing swelling or pain of the left knee, increasing redness of the left knee, developed fever, chills, nausea or vomiting he is to call the office or go to the hospital if it is after hours. He and his wife both voiced understanding. He is to call the office in 7-10 days if he has no improvement with Naprosyn. - DG Knee Complete 4 Views Left; Future  2. Prepatellar bursitis of left knee See above medical treatment plan.  3. History of gout See above medical treatment plan. - Uric acid  4. Multiple myeloma, remission status unspecified ([place]) See above medical treatment plan. - CBC With Differential       Mar Daring, PA-C  [facility]

## 2015-10-20 NOTE — Patient Instructions (Addendum)
Prepatellar Bursitis With Rehab  Bursitis is a condition that is characterized by inflammation of a bursa. Saunders Revel exists in many areas of the body. They are fluid-filled sacs that lie between a soft tissue (skin, tendon, or ligament) and a bone, and they reduce friction between the structures as well as the stress placed on the soft tissue. Prepatellar bursitis is inflammation of the bursa that lies between the skin and the kneecap (patella). This condition often causes pain over the patella. SYMPTOMS   Pain, tenderness, and/or inflammation over the patella.  Pain that worsens with movement of the knee joint.  Decreased range of motion for the knee joint.  A crackling sound (crepitation) when the bursa is moved or touched.  Occasionally, painless swelling of the bursa.  Fever (when infected). CAUSES  Bursitis is caused by damage to the bursa, which results in an inflammatory response. Common mechanisms of injury include:  Direct trauma to the front of the knee.  Repetitive and/or stressful use of the knee. RISK INCREASES WITH:  Activities in which kneeling and/or falling on one's knees is likely (volleyball or football).  Repetitive and stressful training, especially if it involves running on hills.  Improper training techniques, such as a sudden increase in the intensity, frequency, or duration of training.  Failure to warm up properly before activity.  Poor technique.  Artificial turf. PREVENTION   Avoid kneeling or falling on your knees.  Warm up and stretch properly before activity.  Allow for adequate recovery between workouts.  Maintain physical fitness:  Strength, flexibility, and endurance.  Cardiovascular fitness.  Learn and use proper technique. When possible, have a coach correct improper technique.  Wear properly fitted and padded protective equipment (knee pads). PROGNOSIS  If treated properly, then the symptoms of prepatellar bursitis usually resolve  within 2 weeks. RELATED COMPLICATIONS   Recurrent symptoms that result in a chronic problem.  Prolonged healing time, if improperly treated or reinjured.  Limited range of motion.  Infection of bursa.  Chronic inflammation or scarring of bursa. TREATMENT  Treatment initially involves the use of ice and medication to help reduce pain and inflammation. The use of strengthening and stretching exercises may help reduce pain with activity, especially those of the quadriceps and hamstring muscles. These exercises may be performed at home or with referral to a therapist. Your caregiver may recommend knee pads when you return to playing sports, in order to reduce the stress on the prepatellar bursa. If symptoms persist despite treatment, then your caregiver may drain fluid out with a needle (aspirate) the bursa. If symptoms persist for greater than 6 months despite nonsurgical (conservative) treatment, then surgery may be recommended to remove the bursa.  MEDICATION  If pain medication is necessary, then nonsteroidal anti-inflammatory medications, such as aspirin and ibuprofen, or other minor pain relievers, such as acetaminophen, are often recommended.  Do not take pain medication for 7 days before surgery.  Prescription pain relievers may be given if deemed necessary by your caregiver. Use only as directed and only as much as you need.  Corticosteroid injections may be given by your caregiver. These injections should be reserved for the most serious cases, because they may only be given a certain number of times. HEAT AND COLD  Cold treatment (icing) relieves pain and reduces inflammation. Cold treatment should be applied for 10 to 15 minutes every 2 to 3 hours for inflammation and pain and immediately after any activity that aggravates your symptoms. Use ice packs or massage the  area with a piece of ice (ice massage). °· Heat treatment may be used prior to performing the stretching and  strengthening activities prescribed by your caregiver, physical therapist, or athletic trainer. Use a heat pack or soak the injury in warm water. °SEEK MEDICAL CARE IF: °· Treatment seems to offer no benefit, or the condition worsens. °· Any medications produce adverse side effects. °EXERCISES °RANGE OF MOTION (ROM) AND STRETCHING EXERCISES - Prepatellar Bursitis °These exercises may help you when beginning to rehabilitate your injury. Your symptoms may resolve with or without further involvement from your physician, physical therapist or athletic trainer. While completing these exercises, remember:  °· Restoring tissue flexibility helps normal motion to return to the joints. This allows healthier, less painful movement and activity. °· An effective stretch should be held for at least 30 seconds. °· A stretch should never be painful. You should only feel a gentle lengthening or release in the stretched tissue. °STRETCH - Hamstrings, Standing °· Stand or sit and extend your right / left leg, placing your foot on a chair or foot stool °· Keeping a slight arch in your low back and your hips straight forward. °· Lead with your chest and lean forward at the waist until you feel a gentle stretch in the back of your right / left knee or thigh. (When done correctly, this exercise requires leaning only a small distance.) °· Hold this position for __________ seconds. °Repeat __________ times. Complete this stretch __________ times per day. °STRETCH - Quadriceps, Prone  °· Lie on your stomach on a firm surface, such as a bed or padded floor. °· Bend your right / left knee and grasp your ankle. If you are unable to reach, your ankle or pant leg, use a belt around your foot to lengthen your reach. °· Gently pull your heel toward your buttocks. Your knee should not slide out to the side. You should feel a stretch in the front of your thigh and/or knee. °· Hold this position for __________ seconds. °Repeat __________ times.  Complete this stretch __________ times per day.  °STRETCH - Hamstrings/Adductors, V-Sit  °· Sit on the floor with your legs extended in a large "V," keeping your knees straight. °· With your head and chest upright, bend at your waist reaching for your right foot to stretch your left adductors. °· You should feel a stretch in your left inner thigh. Hold for __________ seconds. °· Return to the upright position to relax your leg muscles. °· Continuing to keep your chest upright, bend straight forward at your waist to stretch your hamstrings. °· You should feel a stretch behind both of your thighs and/or knees. Hold for __________ seconds. °· Return to the upright position to relax your leg muscles. °· Repeat steps 2 through 4. °Repeat __________ times. Complete this exercise __________ times per day.  °STRENGTHENING EXERCISES - Prepatellar Bursitis °· These exercises may help you when beginning to rehabilitate your injury. They may resolve your symptoms with or without further involvement from your physician, physical therapist or athletic trainer. While completing these exercises, remember: °· Muscles can gain both the endurance and the strength needed for everyday activities through controlled exercises. °· Complete these exercises as instructed by your physician, physical therapist or athletic trainer. Progress the resistance and repetitions only as guided. °STRENGTH - Quadriceps, Isometrics °· Lie on your back with your right / left leg extended and your opposite knee bent. °· Gradually tense the muscles in the front of your right /   left thigh. You should see either your kneecap slide up toward your hip or increased dimpling just above the knee. This motion will push the back of the knee down toward the floor/mat/bed on which you are lying.  Hold the muscle as tight as you can without increasing your pain for __________ seconds.  Relax the muscles slowly and completely in between each repetition. Repeat  __________ times. Complete this exercise __________ times per day.  STRENGTH - Quadriceps, Short Arcs   Lie on your back. Place a __________ inch towel roll under your knee so that the knee slightly bends.  Raise only your lower leg by tightening the muscles in the front of your thigh. Do not allow your thigh to rise.  Hold this position for __________ seconds. Repeat __________ times. Complete this exercise __________ times per day.  OPTIONAL ANKLE WEIGHTS: Begin with ____________________, but DO NOT exceed ____________________. Increase in1 lb/0.5 kg increments.  STRENGTH - Quadriceps, Straight Leg Raises  Quality counts! Watch for signs that the quadriceps muscle is working to insure you are strengthening the correct muscles and not "cheating" by substituting with healthier muscles.  Lay on your back with your right / left leg extended and your opposite knee bent.  Tense the muscles in the front of your right / left thigh. You should see either your kneecap slide up or increased dimpling just above the knee. Your thigh may even quiver.  Tighten these muscles even more and raise your leg 4 to 6 inches off the floor. Hold for __________ seconds.  Keeping these muscles tense, lower your leg.  Relax the muscles slowly and completely in between each repetition. Repeat __________ times. Complete this exercise __________ times per day.  STRENGTH - Quadriceps, Step-Ups   Use a thick book, step or step stool that is __________ inches tall.  Holding a wall or counter for balance only, not support.  Slowly step-up with your right / left foot, keeping your knee in line with your hip and foot. Do not allow your knee to bend so far that you cannot see your toes.  Slowly unlock your knee and lower yourself to the starting position. Your muscles, not gravity, should lower you. Repeat __________ times. Complete this exercise __________ times per day.   This information is not intended to replace  advice given to you by your health care provider. Make sure you discuss any questions you have with your health care provider.   Document Released: 10/28/2005 Document Revised: 07/19/2015 Document Reviewed: 02/09/2009 Elsevier Interactive Patient Education 2016 [place].   Gout Gout is an inflammatory arthritis caused by a buildup of uric acid crystals in the joints. Uric acid is a chemical that is normally present in the blood. When the level of uric acid in the blood is too high it can form crystals that deposit in your joints and tissues. This causes joint redness, soreness, and swelling (inflammation). Repeat attacks are common. Over time, uric acid crystals can form into masses (tophi) near a joint, destroying bone and causing disfigurement. Gout is treatable and often preventable. CAUSES  The disease begins with elevated levels of uric acid in the blood. Uric acid is produced by your body when it breaks down a naturally found substance called purines. Certain foods you eat, such as meats and fish, contain high amounts of purines. Causes of an elevated uric acid level include:  Being passed down from parent to child (heredity).  Diseases that cause increased uric acid production (such  as obesity, psoriasis, and certain cancers).  Excessive alcohol use.  Diet, especially diets rich in meat and seafood.  Medicines, including certain cancer-fighting medicines (chemotherapy), water pills (diuretics), and aspirin.  Chronic kidney disease. The kidneys are no longer able to remove uric acid well.  Problems with metabolism. Conditions strongly associated with gout include:  Obesity.  High blood pressure.  High cholesterol.  Diabetes. Not everyone with elevated uric acid levels gets gout. It is not understood why some people get gout and others do not. Surgery, joint injury, and eating too much of certain foods are some of the factors that can lead to gout attacks. SYMPTOMS   An  attack of gout comes on quickly. It causes intense pain with redness, swelling, and warmth in a joint.  Fever can occur.  Often, only one joint is involved. Certain joints are more commonly involved:  Base of the big toe.  Knee.  Ankle.  Wrist.  Finger. Without treatment, an attack usually goes away in a few days to weeks. Between attacks, you usually will not have symptoms, which is different from many other forms of arthritis. DIAGNOSIS  Your caregiver will suspect gout based on your symptoms and exam. In some cases, tests may be recommended. The tests may include:  Blood tests.  Urine tests.  X-rays.  Joint fluid exam. This exam requires a needle to remove fluid from the joint (arthrocentesis). Using a microscope, gout is confirmed when uric acid crystals are seen in the joint fluid. TREATMENT  There are two phases to gout treatment: treating the sudden onset (acute) attack and preventing attacks (prophylaxis).  Treatment of an Acute Attack.  Medicines are used. These include anti-inflammatory medicines or steroid medicines.  An injection of steroid medicine into the affected joint is sometimes necessary.  The painful joint is rested. Movement can worsen the arthritis.  You may use warm or cold treatments on painful joints, depending which works best for you.  Treatment to Prevent Attacks.  If you suffer from frequent gout attacks, your caregiver may advise preventive medicine. These medicines are started after the acute attack subsides. These medicines either help your kidneys eliminate uric acid from your body or decrease your uric acid production. You may need to stay on these medicines for a very long time.  The early phase of treatment with preventive medicine can be associated with an increase in acute gout attacks. For this reason, during the first few months of treatment, your caregiver may also advise you to take medicines usually used for acute gout treatment.  Be sure you understand your caregiver's directions. Your caregiver may make several adjustments to your medicine dose before these medicines are effective.  Discuss dietary treatment with your caregiver or dietitian. Alcohol and drinks high in sugar and fructose and foods such as meat, poultry, and seafood can increase uric acid levels. Your caregiver or dietitian can advise you on drinks and foods that should be limited. HOME CARE INSTRUCTIONS   Do not take aspirin to relieve pain. This raises uric acid levels.  Only take over-the-counter or prescription medicines for pain, discomfort, or fever as directed by your caregiver.  Rest the joint as much as possible. When in bed, keep sheets and blankets off painful areas.  Keep the affected joint raised (elevated).  Apply warm or cold treatments to painful joints. Use of warm or cold treatments depends on which works best for you.  Use crutches if the painful joint is in your leg.  Drink  enough fluids to keep your urine clear or pale yellow. This helps your body get rid of uric acid. Limit alcohol, sugary drinks, and fructose drinks.  Follow your dietary instructions. Pay careful attention to the amount of protein you eat. Your daily diet should emphasize fruits, vegetables, whole grains, and fat-free or low-fat milk products. Discuss the use of coffee, vitamin C, and cherries with your caregiver or dietitian. These may be helpful in lowering uric acid levels.  Maintain a healthy body weight. SEEK MEDICAL CARE IF:   You develop diarrhea, vomiting, or any side effects from medicines.  You do not feel better in 24 hours, or you are getting worse. SEEK IMMEDIATE MEDICAL CARE IF:   Your joint becomes suddenly more tender, and you have chills or a fever. MAKE SURE YOU:   Understand these instructions.  Will watch your condition.  Will get help right away if you are not doing well or get worse.   This information is not intended to  replace advice given to you by your health care provider. Make sure you discuss any questions you have with your health care provider.   Document Released: 10/25/2000 Document Revised: 11/18/2014 Document Reviewed: 06/10/2012 Elsevier Interactive Patient Education [employer].

## 2015-10-20 NOTE — Telephone Encounter (Signed)
Advised patient's wife [name]  As directed below.  Thanks,  -Ojani Berenson

## 2015-10-21 LAB — CBC WITH DIFFERENTIAL
BASOS ABS: 0 10*3/uL (ref 0.0–0.2)
Basos: 0 %
EOS (ABSOLUTE): 0.2 10*3/uL (ref 0.0–0.4)
EOS: 4 %
HEMATOCRIT: 38.1 % (ref 37.5–51.0)
Hemoglobin: 12.6 g/dL (ref 12.6–17.7)
IMMATURE GRANULOCYTES: 0 %
Immature Grans (Abs): 0 10*3/uL (ref 0.0–0.1)
LYMPHS ABS: 1.6 10*3/uL (ref 0.7–3.1)
Lymphs: 35 %
MCH: 32 pg (ref 26.6–33.0)
MCHC: 33.1 g/dL (ref 31.5–35.7)
MCV: 97 fL (ref 79–97)
MONOS ABS: 0.3 10*3/uL (ref 0.1–0.9)
Monocytes: 6 %
NEUTROS PCT: 55 %
Neutrophils Absolute: 2.5 10*3/uL (ref 1.4–7.0)
RBC: 3.94 x10E6/uL — ABNORMAL LOW (ref 4.14–5.80)
RDW: 13.7 % (ref 12.3–15.4)
WBC: 4.6 10*3/uL (ref 3.4–10.8)

## 2015-10-21 LAB — URIC ACID: Uric Acid: 2.1 mg/dL — ABNORMAL LOW (ref 3.7–8.6)

## 2015-10-23 ENCOUNTER — Telehealth: Payer: Self-pay

## 2015-10-23 ENCOUNTER — Ambulatory Visit
Admission: RE | Admit: 2015-10-23 | Discharge: 2015-10-23 | Disposition: A | Discharge status: Home / Self Care | Payer: Medicare Other | Source: Ambulatory Visit | Attending: Neurology | Admitting: Neurology

## 2015-10-23 DIAGNOSIS — R413 Other amnesia: Secondary | ICD-10-CM | POA: Insufficient documentation

## 2015-10-23 DIAGNOSIS — I639 Cerebral infarction, unspecified: Secondary | ICD-10-CM | POA: Diagnosis not present

## 2015-10-23 DIAGNOSIS — I638 Other cerebral infarction: Secondary | ICD-10-CM | POA: Insufficient documentation

## 2015-10-23 DIAGNOSIS — N186 End stage renal disease: Secondary | ICD-10-CM | POA: Diagnosis not present

## 2015-10-23 DIAGNOSIS — D689 Coagulation defect, unspecified: Secondary | ICD-10-CM | POA: Diagnosis not present

## 2015-10-23 NOTE — Telephone Encounter (Signed)
Frank Huang patient's wife advised as directed below. Per Frank Huang patient is doing better.  Thanks,  -Kaydence Menard

## 2015-10-23 NOTE — Telephone Encounter (Signed)
-----   Message from Mar Daring, [place] sent at 10/23/2015  8:18 AM EST ----- Uric acid level is low and WBC count is normal.  Most likely not infectious nor gout.  It is most likely a prepatellar bursitis.  Treatment is still similar to gout with antiinflammatories, rest, elevation and ice for swelling.  Continue current treatment and we can follow up to see how it is doing at your scheduled follow up.

## 2015-10-25 ENCOUNTER — Ambulatory Visit (INDEPENDENT_AMBULATORY_CARE_PROVIDER_SITE_OTHER): Payer: Medicare Other

## 2015-10-25 DIAGNOSIS — E538 Deficiency of other specified B group vitamins: Secondary | ICD-10-CM | POA: Diagnosis not present

## 2015-10-25 DIAGNOSIS — N186 End stage renal disease: Secondary | ICD-10-CM | POA: Diagnosis not present

## 2015-10-25 DIAGNOSIS — D689 Coagulation defect, unspecified: Secondary | ICD-10-CM | POA: Diagnosis not present

## 2015-10-25 MED ORDER — CYANOCOBALAMIN 1000 MCG/ML IJ SOLN
1000.0000 ug | Freq: Once | INTRAMUSCULAR | Status: AC
  Administered 2015-10-25: 1000 ug via INTRAMUSCULAR

## 2015-10-26 ENCOUNTER — Other Ambulatory Visit: Payer: Self-pay

## 2015-10-26 MED ORDER — OMEPRAZOLE 20 MG PO CPDR: 20.0000 mg | DELAYED_RELEASE_CAPSULE | Freq: Every day | ORAL | Status: DC

## 2015-10-30 DIAGNOSIS — N186 End stage renal disease: Secondary | ICD-10-CM | POA: Diagnosis not present

## 2015-10-30 DIAGNOSIS — D689 Coagulation defect, unspecified: Secondary | ICD-10-CM | POA: Diagnosis not present

## 2015-10-31 ENCOUNTER — Encounter: Payer: Self-pay | Admitting: Family Medicine

## 2015-10-31 ENCOUNTER — Ambulatory Visit (INDEPENDENT_AMBULATORY_CARE_PROVIDER_SITE_OTHER): Payer: Medicare Other | Admitting: Family Medicine

## 2015-10-31 VITALS — BP 118/58 | HR 66 | Temp 97.5°F | Resp 16 | Wt 199.0 lb

## 2015-10-31 DIAGNOSIS — E538 Deficiency of other specified B group vitamins: Secondary | ICD-10-CM | POA: Diagnosis not present

## 2015-10-31 DIAGNOSIS — R413 Other amnesia: Secondary | ICD-10-CM | POA: Diagnosis not present

## 2015-10-31 NOTE — Progress Notes (Signed)
Patient ID: Frank Huang, male   DOB: 09/29/1932, 81 y.o.   MRN: [id number]    Subjective:  HPI Pt is here for a Vitamin B12 Deficiency follow up. He has been taking injections every week for 4 weeks and is here to follow up and see if his levels are good enough to go to 1 month in between injections. Pt reports that he does feel better since starting them and he feels good today.   Since he was last seen he saw [facility] for bursitis of left knee. Started Naproxen and he reports that it is also doing better. Xray showed OA.   Wife reports that his memory is no better, but not worse at this point.   Prior to Admission medications   Medication Sig Start Date End Date Taking? Authorizing Provider  aspirin EC 81 MG tablet Take 81 mg by mouth daily.   Yes Historical Provider, MD  Cyanocobalamin (VITAMIN B 12 PO) Take by mouth.   Yes Historical Provider, MD  donepezil (ARICEPT) 5 MG tablet Take 1 tablet (5 mg total) by mouth at bedtime. 08/29/15  Yes Richard Maceo Pro., MD  lovastatin (MEVACOR) 40 MG tablet TAKE 1 TABLET BY MOUTH EVERY DAY FOR HIGH CHOLESTEROL 08/10/14  Yes Historical Provider, MD  Multiple Vitamin (MULTI-VITAMINS) TABS Take by mouth.   Yes Historical Provider, MD  naproxen (NAPROSYN) 500 MG tablet Take 1 tablet (500 mg total) by mouth 2 (two) times daily with a meal. 10/20/15  Yes Clearnce Sorrel Burnette, PA-C  omeprazole (PRILOSEC) 20 MG capsule Take 1 capsule (20 mg total) by mouth daily. TAKE 1 CAPSULE BY MOUTH EVERY DAY. 10/26/15  Yes [name]., MD    Patient Active Problem List   Diagnosis Date Noted  . B12 deficiency 07/25/2015  . Deficiency of vitamin B 07/25/2015  . Chest pain 07/01/2015  . Multiple myeloma ([place]) 04/12/2015  . Arteriosclerosis of coronary artery 04/09/2015  . CAFL (chronic airflow limitation) ([place]) 04/09/2015  . Chronic kidney disease requiring chronic dialysis ([place]) 04/09/2015  . Gastro-esophageal reflux disease without esophagitis 04/09/2015    . Gout 04/09/2015  . H/O acute myocardial infarction 04/09/2015  . HLD (hyperlipidemia) 04/09/2015  . BP (high blood pressure) 04/09/2015  . Bad memory 04/09/2015  . 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 Kidney failure 04/09/2015  . End-stage renal disease ([place]) 04/09/2015  . Chronic kidney disease (CKD), stage V ([place]) 07/20/2013  . Chronic kidney disease, stage V ([place]) 07/20/2013  . Absolute anemia 03/31/2013  . Neuropathy ([place]) 03/31/2013    Past Medical History  Diagnosis Date  . Chronic kidney disease   . Cancer ([place])   . Multiple myeloma ([place])   . COPD (chronic obstructive pulmonary disease) ([place])   . Shortness of breath dyspnea   . Neuropathy ([place])   . GERD (gastroesophageal reflux disease)   . Stroke (cerebrum) ([facility])     weakness Lt hand  . Coronary artery disease   . Myocardial infarction [facility]) 2005    Social History   Social History  . Marital Status: Married    Spouse Name: N/A  . Number of Children: N/A  . Years of Education: N/A   Occupational History  . Not on file.   Social History Main Topics  . Smoking status: Current Every Day Smoker -- 0.25 packs/day    Types: Cigarettes  . Smokeless tobacco: Not on file     Comment: smokes about 3 cigarettes  per week  . Alcohol Use: No  . Drug Use: No  . Sexual Activity: Not on file   Other Topics Concern  . Not on file   Social History Narrative    No Known Allergies  Review of Systems  Constitutional: Negative.   HENT: Negative.   Eyes: Negative.   Respiratory: Negative.   Cardiovascular: Negative.   Gastrointestinal: Negative.   Genitourinary: Negative.   Musculoskeletal: Positive for joint pain.  Skin: Negative.   Neurological: Negative.   Endo/Heme/Allergies: Negative.   Psychiatric/Behavioral: Positive for memory loss.    Immunization History  Administered Date(s) Administered  . Influenza, High Dose Seasonal PF 08/03/2015   Objective:  BP  118/58 mmHg  Pulse 66  Temp(Src) 97.5 F (36.4 C) (Oral)  Resp 16  Wt 199 lb (90.266 kg)  Physical Exam  Constitutional: He is oriented to person, place, and time and well-developed, well-nourished, and in no distress.  HENT:  Head: Normocephalic and atraumatic.  Right Ear: External ear normal.  Left Ear: External ear normal.  Nose: Nose normal.  Eyes: Conjunctivae and EOM are normal. Pupils are equal, round, and reactive to light.  Neck: Normal range of motion. Neck supple.  Cardiovascular: Normal rate, regular rhythm and intact distal pulses.   Murmur (3/6 systolic murmur) heard. Pulmonary/Chest: Effort normal and breath sounds normal.  Abdominal: Soft. Bowel sounds are normal.  Musculoskeletal: Normal range of motion.  Neurological: He is alert and oriented to person, place, and time. He has normal reflexes. Gait normal. GCS score is 15.  Skin: Skin is warm and dry.  Psychiatric: Mood, memory, affect and judgment normal.    Lab Results  Component Value Date   WBC 4.6 10/20/2015   HGB 12.0* 09/20/2015   HCT 38.1 10/20/2015   PLT 126* 09/20/2015   GLUCOSE 94 09/20/2015   CHOL 163 08/30/2015   TRIG 78 08/30/2015   HDL 43 08/30/2015   LDLCALC 104* 08/30/2015   TSH 1.080 08/30/2015   INR 1.0 12/11/2013   HGBA1C 4.7* 08/30/2015    CMP     Component Value Date/Time   NA 141 09/20/2015 1127   NA 140 09/29/2014 0848   K 3.8 09/20/2015 1127   K 3.8 09/29/2014 0848   CL 96* 09/20/2015 1127   CL 98 09/29/2014 0848   CO2 32 09/20/2015 1127   CO2 35* 09/29/2014 0848   GLUCOSE 94 09/20/2015 1127   GLUCOSE 117* 09/29/2014 0848   BUN 35* 09/20/2015 1127   BUN 23* 09/29/2014 0848   CREATININE 4.42* 09/20/2015 1127   CREATININE 4.49* 09/29/2014 0848   CALCIUM 9.2 09/20/2015 1127   CALCIUM 9.2 11/22/2014 1042   PROT 7.8 09/20/2015 1127   PROT 7.4 09/29/2014 0848   ALBUMIN 4.0 09/20/2015 1127   ALBUMIN 3.3* 09/29/2014 0848   AST 16 09/20/2015 1127   AST 16 09/29/2014  0848   ALT 5* 09/20/2015 1127   ALT 14 09/29/2014 0848   ALKPHOS 71 09/20/2015 1127   ALKPHOS 91 09/29/2014 0848   BILITOT 0.5 09/20/2015 1127   BILITOT 0.6 09/29/2014 0848   GFRNONAA 11* 09/20/2015 1127   GFRNONAA 14* 09/29/2014 0848   GFRNONAA 19* 02/08/2014 2042   GFRAA 13* 09/20/2015 1127   GFRAA 16* 09/29/2014 0848   GFRAA 22* 02/08/2014 2042    Assessment and Plan :  1. B12 deficiency Feeling better on injections. Start monthly injections now.   2. Bad memory/MCI/early Alzheimer's disease. MMSE next OV. 3. Old CVA This is found  on MRI from last week. 4. Hypertensive nephropathy with end-stage renal disease on dialysis I have done the exam and reviewed the above chart and it is accurate to the best of my knowledge. 5.HLD 6.OA I have done the exam and reviewed the above chart and it is accurate to the best of my knowledge.  Patient was seen and examined by Dr. Miguel Aschoff, and noted scribed by Webb Laws, [place] MD [facility] 10/31/2015 11:04 AM

## 2015-11-01 ENCOUNTER — Ambulatory Visit: Payer: Medicare Other

## 2015-11-01 DIAGNOSIS — N186 End stage renal disease: Secondary | ICD-10-CM | POA: Diagnosis not present

## 2015-11-01 DIAGNOSIS — D689 Coagulation defect, unspecified: Secondary | ICD-10-CM | POA: Diagnosis not present

## 2015-11-03 DIAGNOSIS — N186 End stage renal disease: Secondary | ICD-10-CM | POA: Diagnosis not present

## 2015-11-03 DIAGNOSIS — D689 Coagulation defect, unspecified: Secondary | ICD-10-CM | POA: Diagnosis not present

## 2015-11-06 DIAGNOSIS — N186 End stage renal disease: Secondary | ICD-10-CM | POA: Diagnosis not present

## 2015-11-06 DIAGNOSIS — D689 Coagulation defect, unspecified: Secondary | ICD-10-CM | POA: Diagnosis not present

## 2015-11-08 DIAGNOSIS — N186 End stage renal disease: Secondary | ICD-10-CM | POA: Diagnosis not present

## 2015-11-08 DIAGNOSIS — D689 Coagulation defect, unspecified: Secondary | ICD-10-CM | POA: Diagnosis not present

## 2015-11-09 DIAGNOSIS — I871 Compression of vein: Secondary | ICD-10-CM | POA: Diagnosis not present

## 2015-11-09 DIAGNOSIS — T82858A Stenosis of vascular prosthetic devices, implants and grafts, initial encounter: Secondary | ICD-10-CM | POA: Diagnosis not present

## 2015-11-11 DIAGNOSIS — Z992 Dependence on renal dialysis: Secondary | ICD-10-CM | POA: Diagnosis not present

## 2015-11-11 DIAGNOSIS — I120 Hypertensive chronic kidney disease with stage 5 chronic kidney disease or end stage renal disease: Secondary | ICD-10-CM | POA: Diagnosis not present

## 2015-11-11 DIAGNOSIS — N186 End stage renal disease: Secondary | ICD-10-CM | POA: Diagnosis not present

## 2015-11-13 DIAGNOSIS — D689 Coagulation defect, unspecified: Secondary | ICD-10-CM | POA: Diagnosis not present

## 2015-11-13 DIAGNOSIS — N186 End stage renal disease: Secondary | ICD-10-CM | POA: Diagnosis not present

## 2015-11-13 DIAGNOSIS — D509 Iron deficiency anemia, unspecified: Secondary | ICD-10-CM | POA: Diagnosis not present

## 2015-11-13 DIAGNOSIS — D631 Anemia in chronic kidney disease: Secondary | ICD-10-CM | POA: Diagnosis not present

## 2015-11-15 DIAGNOSIS — D509 Iron deficiency anemia, unspecified: Secondary | ICD-10-CM | POA: Diagnosis not present

## 2015-11-15 DIAGNOSIS — N186 End stage renal disease: Secondary | ICD-10-CM | POA: Diagnosis not present

## 2015-11-15 DIAGNOSIS — D631 Anemia in chronic kidney disease: Secondary | ICD-10-CM | POA: Diagnosis not present

## 2015-11-15 DIAGNOSIS — D689 Coagulation defect, unspecified: Secondary | ICD-10-CM | POA: Diagnosis not present

## 2015-11-20 DIAGNOSIS — D689 Coagulation defect, unspecified: Secondary | ICD-10-CM | POA: Diagnosis not present

## 2015-11-20 DIAGNOSIS — D631 Anemia in chronic kidney disease: Secondary | ICD-10-CM | POA: Diagnosis not present

## 2015-11-20 DIAGNOSIS — D509 Iron deficiency anemia, unspecified: Secondary | ICD-10-CM | POA: Diagnosis not present

## 2015-11-20 DIAGNOSIS — N186 End stage renal disease: Secondary | ICD-10-CM | POA: Diagnosis not present

## 2015-11-22 ENCOUNTER — Ambulatory Visit (INDEPENDENT_AMBULATORY_CARE_PROVIDER_SITE_OTHER): Payer: Medicare Other

## 2015-11-22 DIAGNOSIS — D509 Iron deficiency anemia, unspecified: Secondary | ICD-10-CM | POA: Diagnosis not present

## 2015-11-22 DIAGNOSIS — E538 Deficiency of other specified B group vitamins: Secondary | ICD-10-CM

## 2015-11-22 DIAGNOSIS — N186 End stage renal disease: Secondary | ICD-10-CM | POA: Diagnosis not present

## 2015-11-22 DIAGNOSIS — D631 Anemia in chronic kidney disease: Secondary | ICD-10-CM | POA: Diagnosis not present

## 2015-11-22 DIAGNOSIS — D689 Coagulation defect, unspecified: Secondary | ICD-10-CM | POA: Diagnosis not present

## 2015-11-22 MED ORDER — CYANOCOBALAMIN 1000 MCG/ML IJ SOLN
1000.0000 ug | Freq: Once | INTRAMUSCULAR | Status: AC
  Administered 2015-11-22: 1000 ug via INTRAMUSCULAR

## 2015-11-24 DIAGNOSIS — D631 Anemia in chronic kidney disease: Secondary | ICD-10-CM | POA: Diagnosis not present

## 2015-11-24 DIAGNOSIS — D689 Coagulation defect, unspecified: Secondary | ICD-10-CM | POA: Diagnosis not present

## 2015-11-24 DIAGNOSIS — N186 End stage renal disease: Secondary | ICD-10-CM | POA: Diagnosis not present

## 2015-11-24 DIAGNOSIS — D509 Iron deficiency anemia, unspecified: Secondary | ICD-10-CM | POA: Diagnosis not present

## 2015-11-27 DIAGNOSIS — N186 End stage renal disease: Secondary | ICD-10-CM | POA: Diagnosis not present

## 2015-11-27 DIAGNOSIS — D509 Iron deficiency anemia, unspecified: Secondary | ICD-10-CM | POA: Diagnosis not present

## 2015-11-27 DIAGNOSIS — D631 Anemia in chronic kidney disease: Secondary | ICD-10-CM | POA: Diagnosis not present

## 2015-11-27 DIAGNOSIS — D689 Coagulation defect, unspecified: Secondary | ICD-10-CM | POA: Diagnosis not present

## 2015-11-29 DIAGNOSIS — D509 Iron deficiency anemia, unspecified: Secondary | ICD-10-CM | POA: Diagnosis not present

## 2015-11-29 DIAGNOSIS — N186 End stage renal disease: Secondary | ICD-10-CM | POA: Diagnosis not present

## 2015-11-29 DIAGNOSIS — D631 Anemia in chronic kidney disease: Secondary | ICD-10-CM | POA: Diagnosis not present

## 2015-11-29 DIAGNOSIS — D689 Coagulation defect, unspecified: Secondary | ICD-10-CM | POA: Diagnosis not present

## 2015-12-01 DIAGNOSIS — D631 Anemia in chronic kidney disease: Secondary | ICD-10-CM | POA: Diagnosis not present

## 2015-12-01 DIAGNOSIS — N186 End stage renal disease: Secondary | ICD-10-CM | POA: Diagnosis not present

## 2015-12-01 DIAGNOSIS — D509 Iron deficiency anemia, unspecified: Secondary | ICD-10-CM | POA: Diagnosis not present

## 2015-12-01 DIAGNOSIS — D689 Coagulation defect, unspecified: Secondary | ICD-10-CM | POA: Diagnosis not present

## 2015-12-04 DIAGNOSIS — D689 Coagulation defect, unspecified: Secondary | ICD-10-CM | POA: Diagnosis not present

## 2015-12-04 DIAGNOSIS — D509 Iron deficiency anemia, unspecified: Secondary | ICD-10-CM | POA: Diagnosis not present

## 2015-12-04 DIAGNOSIS — D631 Anemia in chronic kidney disease: Secondary | ICD-10-CM | POA: Diagnosis not present

## 2015-12-04 DIAGNOSIS — N186 End stage renal disease: Secondary | ICD-10-CM | POA: Diagnosis not present

## 2015-12-06 DIAGNOSIS — D509 Iron deficiency anemia, unspecified: Secondary | ICD-10-CM | POA: Diagnosis not present

## 2015-12-06 DIAGNOSIS — D689 Coagulation defect, unspecified: Secondary | ICD-10-CM | POA: Diagnosis not present

## 2015-12-06 DIAGNOSIS — N186 End stage renal disease: Secondary | ICD-10-CM | POA: Diagnosis not present

## 2015-12-06 DIAGNOSIS — D631 Anemia in chronic kidney disease: Secondary | ICD-10-CM | POA: Diagnosis not present

## 2015-12-08 DIAGNOSIS — N186 End stage renal disease: Secondary | ICD-10-CM | POA: Diagnosis not present

## 2015-12-08 DIAGNOSIS — D509 Iron deficiency anemia, unspecified: Secondary | ICD-10-CM | POA: Diagnosis not present

## 2015-12-08 DIAGNOSIS — D631 Anemia in chronic kidney disease: Secondary | ICD-10-CM | POA: Diagnosis not present

## 2015-12-08 DIAGNOSIS — D689 Coagulation defect, unspecified: Secondary | ICD-10-CM | POA: Diagnosis not present

## 2015-12-11 DIAGNOSIS — N186 End stage renal disease: Secondary | ICD-10-CM | POA: Diagnosis not present

## 2015-12-11 DIAGNOSIS — D509 Iron deficiency anemia, unspecified: Secondary | ICD-10-CM | POA: Diagnosis not present

## 2015-12-11 DIAGNOSIS — D631 Anemia in chronic kidney disease: Secondary | ICD-10-CM | POA: Diagnosis not present

## 2015-12-11 DIAGNOSIS — D689 Coagulation defect, unspecified: Secondary | ICD-10-CM | POA: Diagnosis not present

## 2015-12-12 DIAGNOSIS — N186 End stage renal disease: Secondary | ICD-10-CM | POA: Diagnosis not present

## 2015-12-12 DIAGNOSIS — Z992 Dependence on renal dialysis: Secondary | ICD-10-CM | POA: Diagnosis not present

## 2015-12-12 DIAGNOSIS — I120 Hypertensive chronic kidney disease with stage 5 chronic kidney disease or end stage renal disease: Secondary | ICD-10-CM | POA: Diagnosis not present

## 2015-12-13 DIAGNOSIS — D689 Coagulation defect, unspecified: Secondary | ICD-10-CM | POA: Diagnosis not present

## 2015-12-13 DIAGNOSIS — D631 Anemia in chronic kidney disease: Secondary | ICD-10-CM | POA: Diagnosis not present

## 2015-12-13 DIAGNOSIS — N186 End stage renal disease: Secondary | ICD-10-CM | POA: Diagnosis not present

## 2015-12-13 DIAGNOSIS — D509 Iron deficiency anemia, unspecified: Secondary | ICD-10-CM | POA: Diagnosis not present

## 2015-12-15 DIAGNOSIS — N186 End stage renal disease: Secondary | ICD-10-CM | POA: Diagnosis not present

## 2015-12-15 DIAGNOSIS — D689 Coagulation defect, unspecified: Secondary | ICD-10-CM | POA: Diagnosis not present

## 2015-12-15 DIAGNOSIS — D509 Iron deficiency anemia, unspecified: Secondary | ICD-10-CM | POA: Diagnosis not present

## 2015-12-15 DIAGNOSIS — D631 Anemia in chronic kidney disease: Secondary | ICD-10-CM | POA: Diagnosis not present

## 2015-12-18 DIAGNOSIS — N186 End stage renal disease: Secondary | ICD-10-CM | POA: Diagnosis not present

## 2015-12-18 DIAGNOSIS — D509 Iron deficiency anemia, unspecified: Secondary | ICD-10-CM | POA: Diagnosis not present

## 2015-12-18 DIAGNOSIS — D689 Coagulation defect, unspecified: Secondary | ICD-10-CM | POA: Diagnosis not present

## 2015-12-18 DIAGNOSIS — D631 Anemia in chronic kidney disease: Secondary | ICD-10-CM | POA: Diagnosis not present

## 2015-12-20 ENCOUNTER — Ambulatory Visit (INDEPENDENT_AMBULATORY_CARE_PROVIDER_SITE_OTHER): Payer: Medicare Other

## 2015-12-20 DIAGNOSIS — D689 Coagulation defect, unspecified: Secondary | ICD-10-CM | POA: Diagnosis not present

## 2015-12-20 DIAGNOSIS — D509 Iron deficiency anemia, unspecified: Secondary | ICD-10-CM | POA: Diagnosis not present

## 2015-12-20 DIAGNOSIS — E538 Deficiency of other specified B group vitamins: Secondary | ICD-10-CM | POA: Diagnosis not present

## 2015-12-20 DIAGNOSIS — D631 Anemia in chronic kidney disease: Secondary | ICD-10-CM | POA: Diagnosis not present

## 2015-12-20 DIAGNOSIS — N186 End stage renal disease: Secondary | ICD-10-CM | POA: Diagnosis not present

## 2015-12-20 MED ORDER — CYANOCOBALAMIN 1000 MCG/ML IJ SOLN
1000.0000 ug | Freq: Once | INTRAMUSCULAR | Status: AC
  Administered 2015-12-20: 1000 ug via INTRAMUSCULAR

## 2015-12-22 DIAGNOSIS — D689 Coagulation defect, unspecified: Secondary | ICD-10-CM | POA: Diagnosis not present

## 2015-12-22 DIAGNOSIS — D631 Anemia in chronic kidney disease: Secondary | ICD-10-CM | POA: Diagnosis not present

## 2015-12-22 DIAGNOSIS — D509 Iron deficiency anemia, unspecified: Secondary | ICD-10-CM | POA: Diagnosis not present

## 2015-12-22 DIAGNOSIS — N186 End stage renal disease: Secondary | ICD-10-CM | POA: Diagnosis not present

## 2015-12-25 DIAGNOSIS — D689 Coagulation defect, unspecified: Secondary | ICD-10-CM | POA: Diagnosis not present

## 2015-12-25 DIAGNOSIS — D509 Iron deficiency anemia, unspecified: Secondary | ICD-10-CM | POA: Diagnosis not present

## 2015-12-25 DIAGNOSIS — D631 Anemia in chronic kidney disease: Secondary | ICD-10-CM | POA: Diagnosis not present

## 2015-12-25 DIAGNOSIS — N186 End stage renal disease: Secondary | ICD-10-CM | POA: Diagnosis not present

## 2015-12-27 DIAGNOSIS — D509 Iron deficiency anemia, unspecified: Secondary | ICD-10-CM | POA: Diagnosis not present

## 2015-12-27 DIAGNOSIS — D689 Coagulation defect, unspecified: Secondary | ICD-10-CM | POA: Diagnosis not present

## 2015-12-27 DIAGNOSIS — D631 Anemia in chronic kidney disease: Secondary | ICD-10-CM | POA: Diagnosis not present

## 2015-12-27 DIAGNOSIS — N186 End stage renal disease: Secondary | ICD-10-CM | POA: Diagnosis not present

## 2015-12-28 ENCOUNTER — Other Ambulatory Visit: Payer: Medicare Other

## 2015-12-29 DIAGNOSIS — D509 Iron deficiency anemia, unspecified: Secondary | ICD-10-CM | POA: Diagnosis not present

## 2015-12-29 DIAGNOSIS — D631 Anemia in chronic kidney disease: Secondary | ICD-10-CM | POA: Diagnosis not present

## 2015-12-29 DIAGNOSIS — D689 Coagulation defect, unspecified: Secondary | ICD-10-CM | POA: Diagnosis not present

## 2015-12-29 DIAGNOSIS — N186 End stage renal disease: Secondary | ICD-10-CM | POA: Diagnosis not present

## 2015-12-31 ENCOUNTER — Encounter: Payer: Self-pay | Admitting: Hematology and Oncology

## 2016-01-01 DIAGNOSIS — D631 Anemia in chronic kidney disease: Secondary | ICD-10-CM | POA: Diagnosis not present

## 2016-01-01 DIAGNOSIS — D689 Coagulation defect, unspecified: Secondary | ICD-10-CM | POA: Diagnosis not present

## 2016-01-01 DIAGNOSIS — N186 End stage renal disease: Secondary | ICD-10-CM | POA: Diagnosis not present

## 2016-01-01 DIAGNOSIS — D509 Iron deficiency anemia, unspecified: Secondary | ICD-10-CM | POA: Diagnosis not present

## 2016-01-02 ENCOUNTER — Inpatient Hospital Stay: Payer: Medicare Other | Attending: Hematology and Oncology

## 2016-01-02 ENCOUNTER — Ambulatory Visit (INDEPENDENT_AMBULATORY_CARE_PROVIDER_SITE_OTHER): Payer: Medicare Other | Admitting: Family Medicine

## 2016-01-02 VITALS — HR 72 | Temp 97.7°F | Resp 16 | Wt 191.0 lb

## 2016-01-02 DIAGNOSIS — I2510 Atherosclerotic heart disease of native coronary artery without angina pectoris: Secondary | ICD-10-CM | POA: Diagnosis not present

## 2016-01-02 DIAGNOSIS — J449 Chronic obstructive pulmonary disease, unspecified: Secondary | ICD-10-CM | POA: Insufficient documentation

## 2016-01-02 DIAGNOSIS — I252 Old myocardial infarction: Secondary | ICD-10-CM | POA: Diagnosis not present

## 2016-01-02 DIAGNOSIS — M1990 Unspecified osteoarthritis, unspecified site: Secondary | ICD-10-CM | POA: Diagnosis not present

## 2016-01-02 DIAGNOSIS — C9000 Multiple myeloma not having achieved remission: Secondary | ICD-10-CM | POA: Insufficient documentation

## 2016-01-02 DIAGNOSIS — E538 Deficiency of other specified B group vitamins: Secondary | ICD-10-CM | POA: Diagnosis not present

## 2016-01-02 DIAGNOSIS — Z800 Family history of malignant neoplasm of digestive organs: Secondary | ICD-10-CM | POA: Insufficient documentation

## 2016-01-02 DIAGNOSIS — D649 Anemia, unspecified: Secondary | ICD-10-CM | POA: Diagnosis not present

## 2016-01-02 DIAGNOSIS — H6123 Impacted cerumen, bilateral: Secondary | ICD-10-CM | POA: Diagnosis not present

## 2016-01-02 DIAGNOSIS — F17210 Nicotine dependence, cigarettes, uncomplicated: Secondary | ICD-10-CM | POA: Insufficient documentation

## 2016-01-02 DIAGNOSIS — K219 Gastro-esophageal reflux disease without esophagitis: Secondary | ICD-10-CM | POA: Diagnosis not present

## 2016-01-02 DIAGNOSIS — Z7982 Long term (current) use of aspirin: Secondary | ICD-10-CM | POA: Diagnosis not present

## 2016-01-02 DIAGNOSIS — N189 Chronic kidney disease, unspecified: Secondary | ICD-10-CM | POA: Diagnosis not present

## 2016-01-02 DIAGNOSIS — Z79899 Other long term (current) drug therapy: Secondary | ICD-10-CM | POA: Insufficient documentation

## 2016-01-02 DIAGNOSIS — Z9221 Personal history of antineoplastic chemotherapy: Secondary | ICD-10-CM | POA: Insufficient documentation

## 2016-01-02 DIAGNOSIS — H9193 Unspecified hearing loss, bilateral: Secondary | ICD-10-CM | POA: Diagnosis not present

## 2016-01-02 DIAGNOSIS — R413 Other amnesia: Secondary | ICD-10-CM

## 2016-01-02 DIAGNOSIS — D631 Anemia in chronic kidney disease: Secondary | ICD-10-CM

## 2016-01-02 LAB — CBC WITH DIFFERENTIAL/PLATELET
Basophils Absolute: 0 10*3/uL (ref 0–0.1)
Basophils Relative: 0 %
Eosinophils Absolute: 0.1 10*3/uL (ref 0–0.7)
Eosinophils Relative: 2 %
HCT: 35.1 % — ABNORMAL LOW (ref 40.0–52.0)
Hemoglobin: 11.7 g/dL — ABNORMAL LOW (ref 13.0–18.0)
Lymphocytes Relative: 19 %
Lymphs Abs: 1.1 10*3/uL (ref 1.0–3.6)
MCH: 32.9 pg (ref 26.0–34.0)
MCHC: 33.4 g/dL (ref 32.0–36.0)
MCV: 98.4 fL (ref 80.0–100.0)
Monocytes Absolute: 0.3 10*3/uL (ref 0.2–1.0)
Monocytes Relative: 5 %
Neutro Abs: 4.2 10*3/uL (ref 1.4–6.5)
Neutrophils Relative %: 74 %
Platelets: 136 10*3/uL — ABNORMAL LOW (ref 150–440)
RBC: 3.56 MIL/uL — ABNORMAL LOW (ref 4.40–5.90)
RDW: 14.6 % — ABNORMAL HIGH (ref 11.5–14.5)
WBC: 5.8 10*3/uL (ref 3.8–10.6)

## 2016-01-02 LAB — COMPREHENSIVE METABOLIC PANEL
ALT: 8 U/L — ABNORMAL LOW (ref 17–63)
AST: 15 U/L (ref 15–41)
Albumin: 4.2 g/dL (ref 3.5–5.0)
Alkaline Phosphatase: 65 U/L (ref 38–126)
Anion gap: 10 (ref 5–15)
BUN: 35 mg/dL — ABNORMAL HIGH (ref 6–20)
CO2: 31 mmol/L (ref 22–32)
Calcium: 9 mg/dL (ref 8.9–10.3)
Chloride: 94 mmol/L — ABNORMAL LOW (ref 101–111)
Creatinine, Ser: 4.78 mg/dL — ABNORMAL HIGH (ref 0.61–1.24)
GFR calc Af Amer: 12 mL/min — ABNORMAL LOW (ref >60)
GFR calc non Af Amer: 10 mL/min — ABNORMAL LOW (ref >60)
Glucose, Bld: 93 mg/dL (ref 65–99)
Potassium: 3.9 mmol/L (ref 3.5–5.1)
Sodium: 135 mmol/L (ref 135–145)
Total Bilirubin: 1.1 mg/dL (ref 0.3–1.2)
Total Protein: 8 g/dL (ref 6.5–8.1)

## 2016-01-02 LAB — FERRITIN: Ferritin: 743 ng/mL — ABNORMAL HIGH (ref 24–336)

## 2016-01-02 LAB — SEDIMENTATION RATE: Sed Rate: 69 mm/hr — ABNORMAL HIGH (ref 0–20)

## 2016-01-02 MED ORDER — DONEPEZIL HCL 10 MG PO TABS: 10.0000 mg | ORAL_TABLET | Freq: Every day | ORAL | Status: DC

## 2016-01-02 NOTE — Patient Instructions (Signed)
Advised patient and his wife to use Debrox OTC and if patient continues to have trouble we will refer to ENT.

## 2016-01-02 NOTE — Progress Notes (Signed)
Patient ID: Frank Huang, male   DOB: August 06, 1932, 82 y.o.   MRN: [id number]    Subjective:  HPI  Patient is here to get his ears cleaned out. They had a nurse come out to their home through insurance- they do this once a year- and patient was advised that he had a lot of wax in both ears. Patient states that he has noticed decreased hearing, no pain on ringing in his ears.  Prior to Admission medications   Medication Sig Start Date End Date Taking? Authorizing Provider  aspirin EC 81 MG tablet Take 81 mg by mouth daily.    Historical Provider, MD  Cyanocobalamin (VITAMIN B 12 PO) Take by mouth.    Historical Provider, MD  donepezil (ARICEPT) 5 MG tablet Take 1 tablet (5 mg total) by mouth at bedtime. 08/29/15   Richard Maceo Pro., MD  lovastatin (MEVACOR) 40 MG tablet TAKE 1 TABLET BY MOUTH EVERY DAY FOR HIGH CHOLESTEROL 08/10/14   Historical Provider, MD  Multiple Vitamin (MULTI-VITAMINS) TABS Take by mouth.    Historical Provider, MD  naproxen (NAPROSYN) 500 MG tablet Take 1 tablet (500 mg total) by mouth 2 (two) times daily with a meal. 10/20/15   Mar Daring, PA-C  omeprazole (PRILOSEC) 20 MG capsule Take 1 capsule (20 mg total) by mouth daily. TAKE 1 CAPSULE BY MOUTH EVERY DAY. 10/26/15   [name]., MD    Patient Active Problem List   Diagnosis Date Noted  . B12 deficiency 07/25/2015  . Deficiency of vitamin B 07/25/2015  . Chest pain 07/01/2015  . Multiple myeloma ([place]) 04/12/2015  . Arteriosclerosis of coronary artery 04/09/2015  . CAFL (chronic airflow limitation) ([place]) 04/09/2015  . Chronic kidney disease requiring chronic dialysis ([place]) 04/09/2015  . Gastro-esophageal reflux disease without esophagitis 04/09/2015  . Gout 04/09/2015  . H/O acute myocardial infarction 04/09/2015  . HLD (hyperlipidemia) 04/09/2015  . BP (high blood pressure) 04/09/2015  . Bad memory 04/09/2015  . 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  Kidney failure 04/09/2015  . End-stage renal disease ([place]) 04/09/2015  . Chronic kidney disease (CKD), stage V ([place]) 07/20/2013  . Chronic kidney disease, stage V ([place]) 07/20/2013  . Absolute anemia 03/31/2013  . Neuropathy ([place]) 03/31/2013    Past Medical History  Diagnosis Date  . Chronic kidney disease   . Cancer ([place])   . Multiple myeloma ([place])   . COPD (chronic obstructive pulmonary disease) ([place])   . Shortness of breath dyspnea   . Neuropathy ([place])   . GERD (gastroesophageal reflux disease)   . Stroke (cerebrum) ([facility])     weakness Lt hand  . Coronary artery disease   . Myocardial infarction [facility]) 2005    Social History   Social History  . Marital Status: Married    Spouse Name: N/A  . Number of Children: N/A  . Years of Education: N/A   Occupational History  . Not on file.   Social History Main Topics  . Smoking status: Current Every Day Smoker -- 0.25 packs/day    Types: Cigarettes  . Smokeless tobacco: Not on file     Comment: smokes about 3 cigarettes per week  . Alcohol Use: No  . Drug Use: No  . Sexual Activity: Not on file   Other Topics Concern  . Not on file   Social History Narrative    No Known Allergies  Review of Systems  Constitutional: Negative.  HENT: Positive for hearing loss. Negative for ear pain and tinnitus.   Eyes: Negative.   Respiratory: Negative.   Cardiovascular: Negative.   Gastrointestinal: Negative.   Skin: Negative.   Psychiatric/Behavioral: Negative.     Immunization History  Administered Date(s) Administered  . Influenza, High Dose Seasonal PF 08/03/2015   Objective:  Pulse 72  Temp(Src) 97.7 F (36.5 C)  Resp 16  Wt 191 lb (86.637 kg)  Physical Exam  Constitutional: He is well-developed, well-nourished, and in no distress.  HENT:  Head: Normocephalic and atraumatic.  Nose: Nose normal.  Excessive ear wax present bilateral ears.  Eyes: Conjunctivae are normal.  Neck: Neck supple.  Cardiovascular:  Normal rate, regular rhythm and normal heart sounds.   Pulmonary/Chest: Effort normal and breath sounds normal.  Abdominal: Soft.  Skin: Skin is warm.  S Ks present  Psychiatric: Mood, memory, affect and judgment normal.    Lab Results  Component Value Date   WBC 5.8 01/02/2016   HGB 11.7* 01/02/2016   HCT 35.1* 01/02/2016   PLT 136* 01/02/2016   GLUCOSE 93 01/02/2016   CHOL 163 08/30/2015   TRIG 78 08/30/2015   HDL 43 08/30/2015   LDLCALC 104* 08/30/2015   TSH 1.080 08/30/2015   INR 1.0 12/11/2013   HGBA1C 4.7* 08/30/2015    CMP     Component Value Date/Time   NA 135 01/02/2016 1023   NA 140 09/29/2014 0848   K 3.9 01/02/2016 1023   K 3.8 09/29/2014 0848   CL 94* 01/02/2016 1023   CL 98 09/29/2014 0848   CO2 31 01/02/2016 1023   CO2 35* 09/29/2014 0848   GLUCOSE 93 01/02/2016 1023   GLUCOSE 117* 09/29/2014 0848   BUN 35* 01/02/2016 1023   BUN 23* 09/29/2014 0848   CREATININE 4.78* 01/02/2016 1023   CREATININE 4.49* 09/29/2014 0848   CALCIUM 9.0 01/02/2016 1023   CALCIUM 9.2 11/22/2014 1042   PROT 8.0 01/02/2016 1023   PROT 7.4 09/29/2014 0848   ALBUMIN 4.2 01/02/2016 1023   ALBUMIN 3.3* 09/29/2014 0848   AST 15 01/02/2016 1023   AST 16 09/29/2014 0848   ALT 8* 01/02/2016 1023   ALT 14 09/29/2014 0848   ALKPHOS 65 01/02/2016 1023   ALKPHOS 91 09/29/2014 0848   BILITOT 1.1 01/02/2016 1023   BILITOT 0.6 09/29/2014 0848   GFRNONAA 10* 01/02/2016 1023   GFRNONAA 14* 09/29/2014 0848   GFRNONAA 19* 02/08/2014 2042   GFRAA 12* 01/02/2016 1023   GFRAA 16* 09/29/2014 0848   GFRAA 22* 02/08/2014 2042    Assessment and Plan :  1. Decreased hearing of both ears Better after ear irrigation-bilateral.  2. Excess ear wax, bilateral Right ear looks good after irrigation, left ear is better but still is impacted deeper in the ear canal-due to patient's intolerance to further flash it out today advised patient and his wife to use Debrox OTC and if patient continues  to have trouble we will refer to ENT.   3. Bad memory/Dementia Worsening. Increase Aricept to 10 mg. - donepezil (ARICEPT) 10 MG tablet; Take 1 tablet (10 mg total) by mouth at bedtime.  Dispense: 90 tablet; Refill: [facility] 01/02/2016 2:42 PM

## 2016-01-03 LAB — PROTEIN ELECTROPHORESIS, SERUM
A/G Ratio: 1.2 (ref 0.7–1.7)
Albumin ELP: 3.8 g/dL (ref 2.9–4.4)
Alpha-1-Globulin: 0.3 g/dL (ref 0.0–0.4)
Alpha-2-Globulin: 0.8 g/dL (ref 0.4–1.0)
Beta Globulin: 1.8 g/dL — ABNORMAL HIGH (ref 0.7–1.3)
Gamma Globulin: 0.4 g/dL (ref 0.4–1.8)
Globulin, Total: 3.3 g/dL (ref 2.2–3.9)
M-Spike, %: 0.6 g/dL — ABNORMAL HIGH
Total Protein ELP: 7.1 g/dL (ref 6.0–8.5)

## 2016-01-03 LAB — KAPPA/LAMBDA LIGHT CHAINS
Kappa free light chain: 1133.4 mg/L — ABNORMAL HIGH (ref 3.30–19.40)
Kappa, lambda light chain ratio: 59.94 — ABNORMAL HIGH (ref 0.26–1.65)
Lambda free light chains: 18.91 mg/L (ref 5.71–26.30)

## 2016-01-04 ENCOUNTER — Ambulatory Visit: Payer: Medicare Other | Admitting: Hematology and Oncology

## 2016-01-05 DIAGNOSIS — D689 Coagulation defect, unspecified: Secondary | ICD-10-CM | POA: Diagnosis not present

## 2016-01-05 DIAGNOSIS — D509 Iron deficiency anemia, unspecified: Secondary | ICD-10-CM | POA: Diagnosis not present

## 2016-01-05 DIAGNOSIS — N186 End stage renal disease: Secondary | ICD-10-CM | POA: Diagnosis not present

## 2016-01-05 DIAGNOSIS — D631 Anemia in chronic kidney disease: Secondary | ICD-10-CM | POA: Diagnosis not present

## 2016-01-08 ENCOUNTER — Telehealth: Payer: Self-pay | Admitting: Family Medicine

## 2016-01-08 DIAGNOSIS — D509 Iron deficiency anemia, unspecified: Secondary | ICD-10-CM | POA: Diagnosis not present

## 2016-01-08 DIAGNOSIS — N186 End stage renal disease: Secondary | ICD-10-CM | POA: Diagnosis not present

## 2016-01-08 DIAGNOSIS — D689 Coagulation defect, unspecified: Secondary | ICD-10-CM | POA: Diagnosis not present

## 2016-01-08 DIAGNOSIS — D631 Anemia in chronic kidney disease: Secondary | ICD-10-CM | POA: Diagnosis not present

## 2016-01-08 NOTE — Telephone Encounter (Signed)
Medication removed from list and medication list printed.

## 2016-01-08 NOTE — Telephone Encounter (Signed)
Pt's wife [name] would like naproxen (NAPROSYN) 500 MG tablet removed from his medication list and would to pick up an updated list of his medications to take to his appt with the cancer center. Thanks TNP

## 2016-01-09 ENCOUNTER — Encounter: Payer: Self-pay | Admitting: Hematology and Oncology

## 2016-01-09 ENCOUNTER — Inpatient Hospital Stay (HOSPITAL_BASED_OUTPATIENT_CLINIC_OR_DEPARTMENT_OTHER): Payer: Medicare Other | Admitting: Hematology and Oncology

## 2016-01-09 ENCOUNTER — Other Ambulatory Visit: Payer: Self-pay

## 2016-01-09 VITALS — BP 102/61 | HR 64 | Temp 97.3°F | Resp 18 | Ht 75.0 in | Wt 191.6 lb

## 2016-01-09 DIAGNOSIS — Z992 Dependence on renal dialysis: Secondary | ICD-10-CM | POA: Diagnosis not present

## 2016-01-09 DIAGNOSIS — Z79899 Other long term (current) drug therapy: Secondary | ICD-10-CM | POA: Diagnosis not present

## 2016-01-09 DIAGNOSIS — N189 Chronic kidney disease, unspecified: Secondary | ICD-10-CM

## 2016-01-09 DIAGNOSIS — C9000 Multiple myeloma not having achieved remission: Secondary | ICD-10-CM

## 2016-01-09 DIAGNOSIS — F17210 Nicotine dependence, cigarettes, uncomplicated: Secondary | ICD-10-CM

## 2016-01-09 DIAGNOSIS — Z9221 Personal history of antineoplastic chemotherapy: Secondary | ICD-10-CM

## 2016-01-09 DIAGNOSIS — J449 Chronic obstructive pulmonary disease, unspecified: Secondary | ICD-10-CM | POA: Diagnosis not present

## 2016-01-09 DIAGNOSIS — N186 End stage renal disease: Secondary | ICD-10-CM | POA: Diagnosis not present

## 2016-01-09 DIAGNOSIS — E538 Deficiency of other specified B group vitamins: Secondary | ICD-10-CM | POA: Diagnosis not present

## 2016-01-09 DIAGNOSIS — K219 Gastro-esophageal reflux disease without esophagitis: Secondary | ICD-10-CM | POA: Diagnosis not present

## 2016-01-09 DIAGNOSIS — Z800 Family history of malignant neoplasm of digestive organs: Secondary | ICD-10-CM | POA: Diagnosis not present

## 2016-01-09 DIAGNOSIS — D649 Anemia, unspecified: Secondary | ICD-10-CM

## 2016-01-09 DIAGNOSIS — M1990 Unspecified osteoarthritis, unspecified site: Secondary | ICD-10-CM | POA: Diagnosis not present

## 2016-01-09 DIAGNOSIS — R413 Other amnesia: Secondary | ICD-10-CM | POA: Diagnosis not present

## 2016-01-09 DIAGNOSIS — I252 Old myocardial infarction: Secondary | ICD-10-CM

## 2016-01-09 DIAGNOSIS — I2510 Atherosclerotic heart disease of native coronary artery without angina pectoris: Secondary | ICD-10-CM

## 2016-01-09 DIAGNOSIS — Z7982 Long term (current) use of aspirin: Secondary | ICD-10-CM | POA: Diagnosis not present

## 2016-01-09 DIAGNOSIS — I120 Hypertensive chronic kidney disease with stage 5 chronic kidney disease or end stage renal disease: Secondary | ICD-10-CM | POA: Diagnosis not present

## 2016-01-09 NOTE — Progress Notes (Signed)
[facility] day:  01/09/2016   Chief Complaint: Frank Huang is a 82 y.o. male with smoldering multiple myeloma who is seen for 3 month assessment.  HPI:  The patient was last seen in the medical oncology clinic on 10/04/2015.  At that time, he was seen for 3 month assessment.  Symptomatically, he felt well.  He denied bone pain or infections.  Exam was stable.  Per notes, his renal insufficiency appeared unrelated to his myeloma.  We discussed his B12 deficiency and need for supplimentation.  B12 was 120 on 07/05/2015.   His wife notes that he received weekly B12 for a month and has had 2 monthly injections of B12 since his last appointment.  He also takes B12 by mouth.  Labs on 01/02/2016 revealed a hematocrit 35.1, hemoglobin 11.7, MCV 98.4, platelets 136,000, white count 5800 with an ANC of 4200.  CMP revealed a creatinine of 4.78.  Serum protein electrophoresis revealed an M spike of 0.6 gm/dL.  Kappa free light light chains were 1133.40, lambda free light chains 18.91, and ratio of 59.94 (high).  Calcium was 9.0.  Ferritin was 743.  ESR was 69.  Symptomatically, he feels "fine".  He denies any symptoms.  His wife notes that he was recently started on Aricept for his memory.   Past Medical History  Diagnosis Date  . Chronic kidney disease   . Cancer ([place])   . Multiple myeloma ([place])   . COPD (chronic obstructive pulmonary disease) ([place])   . Shortness of breath dyspnea   . Neuropathy ([place])   . GERD (gastroesophageal reflux disease)   . Stroke (cerebrum) ([facility])     weakness Lt hand  . Coronary artery disease   . Myocardial infarction [facility]) 2005    Past Surgical History  Procedure Laterality Date  . Total hip arthroplasty Left   . Appendectomy    . Cholecystectomy    . Av fistula placement Left   . Shoulder arthroscopy with open rotator cuff repair Left 09/19/2015    Procedure: SHOULDER ARTHROSCOPY , subacromial decompression, debridement;   Surgeon: Corky Mull, MD;  Location: [facility] ORS;  Service: Orthopedics;  Laterality: Left;    Family History  Problem Relation Age of Onset  . Alzheimer's disease Mother   . Kidney failure Father   . Bone cancer Sister   . Stomach cancer Sister     Social History:  reports that he has been smoking Cigarettes.  He has been smoking about 0.25 packs per day. He does not have any smokeless tobacco history on file. He reports that he does not drink alcohol or use illicit drugs.  The patient is accompanied by his wife today.  Allergies: No Known Allergies  Current Medications: Current Outpatient Prescriptions  Medication Sig Dispense Refill  . aspirin EC 81 MG tablet Take 81 mg by mouth daily.    . Cyanocobalamin (VITAMIN B 12 PO) Take by mouth.    . donepezil (ARICEPT) 10 MG tablet Take 1 tablet (10 mg total) by mouth at bedtime. 90 tablet 3  . lovastatin (MEVACOR) 40 MG tablet TAKE 1 TABLET BY MOUTH EVERY DAY FOR HIGH CHOLESTEROL    . Multiple Vitamin (MULTI-VITAMINS) TABS Take by mouth.    Marland Kitchen omeprazole (PRILOSEC) 20 MG capsule Take 1 capsule (20 mg total) by mouth daily. TAKE 1 CAPSULE BY MOUTH EVERY DAY. 90 capsule 3   No current facility-administered medications for this visit.    Review  of Systems:  GENERAL:  Feels "fine".  No fevers or sweats.  Weight stable. PERFORMANCE STATUS (ECOG):  1 HEENT:  No visual changes, runny nose, sore throat, mouth sores or tenderness. Lungs: No shortness of breath or cough.  No hemoptysis. Cardiac:  No chest pain, palpitations, orthopnea, or PND. GI:  No nausea, vomiting, diarrhea, constipation, melena or hematochezia. GU:  On dialysis.  No urgency, frequency, dysuria, or hematuria. Musculoskeletal:  Left hip discomfort s/p replacement.  No back pain.  No muscle tenderness. Extremities:  No pain or swelling. Skin:  Bruises easily on baby aspirin.  No rashes or skin changes. Neuro:  Left foot numbness. No headache,weakness, balance or  coordination issues. Endocrine:  No diabetes, thyroid issues, hot flashes or night sweats. Psych:  No mood changes, depression or anxiety. Pain:  No focal pain. Review of systems:  All other systems reviewed and found to be negative.  Physical Exam: Blood pressure 102/61, pulse 64, temperature 97.3 F (36.3 C), temperature source Other (Comment), resp. rate 18, height 6' 3"  (1.905 m), weight 191 lb 9.3 oz (86.9 kg). GENERAL:  Well developed, well nourished, gentleman sitting comfortably in the exam room in no acute distress. MENTAL STATUS:  Alert and oriented to person, place and time. HEAD:  Thin gray hair.  Male pattern baldness.  Normocephalic, atraumatic, face symmetric, no Cushingoid features. EYES:  Silver rimmed glasses.  Blue eyes.  Pupils equal round and reactive to light and accomodation.  No conjunctivitis or scleral icterus. ENT:  Oropharynx clear without lesion.  Tongue normal. Mucous membranes moist.  RESPIRATORY:  Clear to auscultation without rales, wheezes or rhonchi. CARDIOVASCULAR:  Regular rate and rhythm without murmur, rub or gallop. ABDOMEN:  Soft, non-tender, with active bowel sounds, and no hepatosplenomegaly.  No masses. SKIN:  No rashes, ulcers or lesions. EXTREMITIES: No edema, no skin discoloration or tenderness.  No palpable cords. LYMPH NODES: No palpable cervical, supraclavicular, axillary or inguinal adenopathy  NEUROLOGICAL: Unremarkable. PSYCH:  Appropriate.  No visits with results within 3 Day(s) from this visit. Latest known visit with results is:  Appointment on 01/02/2016  Component Date Value Ref Range Status  . WBC 01/02/2016 5.8  3.8 - 10.6 K/uL Final  . RBC 01/02/2016 3.56* 4.40 - 5.90 MIL/uL Final  . Hemoglobin 01/02/2016 11.7* 13.0 - 18.0 g/dL Final  . HCT 01/02/2016 35.1* 40.0 - 52.0 % Final  . MCV 01/02/2016 98.4  80.0 - 100.0 fL Final  . MCH 01/02/2016 32.9  26.0 - 34.0 pg Final  . MCHC 01/02/2016 33.4  32.0 - 36.0 g/dL Final  . RDW  01/02/2016 14.6* 11.5 - 14.5 % Final  . Platelets 01/02/2016 136* 150 - 440 K/uL Final  . Neutrophils Relative % 01/02/2016 74   Final  . Neutro Abs 01/02/2016 4.2  1.4 - 6.5 K/uL Final  . Lymphocytes Relative 01/02/2016 19   Final  . Lymphs Abs 01/02/2016 1.1  1.0 - 3.6 K/uL Final  . Monocytes Relative 01/02/2016 5   Final  . Monocytes Absolute 01/02/2016 0.3  0.2 - 1.0 K/uL Final  . Eosinophils Relative 01/02/2016 2   Final  . Eosinophils Absolute 01/02/2016 0.1  0 - 0.7 K/uL Final  . Basophils Relative 01/02/2016 0   Final  . Basophils Absolute 01/02/2016 0.0  0 - 0.1 K/uL Final  . Sodium 01/02/2016 135  135 - 145 mmol/L Final  . Potassium 01/02/2016 3.9  3.5 - 5.1 mmol/L Final  . Chloride 01/02/2016 94* 101 - 111 mmol/L  Final  . CO2 01/02/2016 31  22 - 32 mmol/L Final  . Glucose, Bld 01/02/2016 93  65 - 99 mg/dL Final  . BUN 01/02/2016 35* 6 - 20 mg/dL Final  . Creatinine, Ser 01/02/2016 4.78* 0.61 - 1.24 mg/dL Final  . Calcium 01/02/2016 9.0  8.9 - 10.3 mg/dL Final  . Total Protein 01/02/2016 8.0  6.5 - 8.1 g/dL Final  . Albumin 01/02/2016 4.2  3.5 - 5.0 g/dL Final  . AST 01/02/2016 15  15 - 41 U/L Final  . ALT 01/02/2016 8* 17 - 63 U/L Final  . Alkaline Phosphatase 01/02/2016 65  38 - 126 U/L Final  . Total Bilirubin 01/02/2016 1.1  0.3 - 1.2 mg/dL Final  . GFR calc non Af Amer 01/02/2016 10* >60 mL/min Final  . GFR calc Af Amer 01/02/2016 12* >60 mL/min Final   Comment: (NOTE) The eGFR has been calculated using the CKD EPI equation. This calculation has not been validated in all clinical situations. eGFR's persistently <60 mL/min signify possible Chronic Kidney Disease.   . Anion gap 01/02/2016 10  5 - 15 Final  . Total Protein ELP 01/02/2016 7.1  6.0 - 8.5 g/dL Final  . Albumin ELP 01/02/2016 3.8  2.9 - 4.4 g/dL Final  . Alpha-1-Globulin 01/02/2016 0.3  0.0 - 0.4 g/dL Final  . Alpha-2-Globulin 01/02/2016 0.8  0.4 - 1.0 g/dL Final  . Beta Globulin 01/02/2016 1.8* 0.7 -  1.3 g/dL Final  . Gamma Globulin 01/02/2016 0.4  0.4 - 1.8 g/dL Final  . M-Spike, % 01/02/2016 0.6* Not Observed g/dL Final  . SPE Interp. 01/02/2016 Comment   Final   Comment: (NOTE) The SPE pattern demonstrates a single peak (M-spike) in the beta region which may represent monoclonal protein. This peak may also be caused by the presence of fibrinogen or circulating immune complexes. If clinically indicated, the presence of a monoclonal gammopathy may be confirmed by immunofixation, as well as evaluation of the urine for the presence of Bence-Jones protein. Performed At: [facility] [place], [place] [id number] Lindon Romp MD [id number]   . Comment 01/02/2016 Comment   Final   Comment: (NOTE) Protein electrophoresis scan will follow via computer, mail, or courier delivery.   Marland Kitchen GLOBULIN, TOTAL 01/02/2016 3.3  2.2 - 3.9 g/dL Corrected  . A/G Ratio 01/02/2016 1.2  0.7 - 1.7 Corrected  . Kappa free light chain 01/02/2016 1133.40* 3.30 - 19.40 mg/L Final  . Lamda free light chains 01/02/2016 18.91  5.71 - 26.30 mg/L Final  . Kappa, lamda light chain ratio 01/02/2016 59.94* 0.26 - 1.65 Final   Comment: (NOTE) Performed At: [facility] [place], [place] [id number] Lindon Romp MD [id number]   . Ferritin 01/02/2016 743* 24 - 336 ng/mL Final  . Sed Rate 01/02/2016 69* 0 - 20 mm/hr Final    Assessment:  XZAVION DOSWELL is a 82 y.o. 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  Bone marrow revealed 20% plasma cells. He was treated with thalidomide for 7 months then discontinued in 06/2005 secondary to rash and depression. He was treated briefly with Revlimid in 2013 which was also discontinued secondary to rash.    M spike was 0.5 gm/dL on 06/15/2013, 0.8 gm/dL on 01/19/2014, 0.5 gm/dL on 11/22/2014, 0.9 on 06/28/2015, 0.7 on 09/20/2015, and 0.6 on 01/02/2016.  Kappa free light  chains were 512 in 06/15/2013, 686 11/02/2013, 929 on 01/19/2014,  1212 in 05/10/2014, 1409 on 11/22/2014, 1119 on 02/28/2015, 851 (ratio 63.84) on 06/28/2015, 1006 (ratio 74.50) on 09/20/2015, and 1133 (ratio 59.94) on 01/02/2016.  He has subsequently been followed off therapy.  He had a hip fracture in in 12/2013.  Per notes, pathology revealed no myeloma.  Bone survey on 01/17/2015 revealed a 9 mm lytic lesion in the frontoparietal region (previously 7 mm).   He has end-stage renal disease unrelated to his myeloma.  He has been on dialysis since 11/2013.   He has anemia likely due to chronic renal insufficiency.  He has a history of iron deficiency. He had a negative colonoscopy and upper endoscopy in 08/2004. Notes indicate he also had a small bowel capsule study. EGD and colonoscopy in 2014 revealed gastric lesions which were cauterized. He receives IV iron and Procrit with dialysis.  Ferritin is elevated (? acute phase reactant as ESR elevated).  Ferritin was 1121 on 07/05/2015 and 743 on 01/02/2016.  Iron saturation was 30% on 07/05/2015.  He has B12 deficiency.  B12 was 120 on 07/05/2015.  Folate was 10.8.  He is on B12 IM monthy as well as oral B12.   He has a history of left shoulder discomfort.  MRI on 06/14/2015 revealed a tendinopathy, osteoarthritis, and capsulitis.   Symptomatically, he denies any complaint.  He denies bone pain or infections.  Exam is stable.  M spike is decreasing.  Plan: 1. Review labs from 01/02/2016. 2. Re-discuss renal insufficiency.  Etiology appears unrelated to smoldering myeloma.  Discuss plan for follow-up every 3 months and ongoing assessment of any end-organ disease.  Discuss follow-up bone survey. 3. Continue B12 supplimentation.  4. Schedule bone survey on 04/11/2016. 5. RTC 1 week prior to next appointment for labs (CBC with diff, SPEP, FLCA). 6. RTC in 3 months for MD assessment, review of labs, and bone survey.    Lequita Asal, MD   01/09/2016, 11:41 AM

## 2016-01-10 DIAGNOSIS — D509 Iron deficiency anemia, unspecified: Secondary | ICD-10-CM | POA: Diagnosis not present

## 2016-01-10 DIAGNOSIS — D631 Anemia in chronic kidney disease: Secondary | ICD-10-CM | POA: Diagnosis not present

## 2016-01-10 DIAGNOSIS — C44622 Squamous cell carcinoma of skin of right upper limb, including shoulder: Secondary | ICD-10-CM | POA: Diagnosis not present

## 2016-01-10 DIAGNOSIS — L820 Inflamed seborrheic keratosis: Secondary | ICD-10-CM | POA: Diagnosis not present

## 2016-01-10 DIAGNOSIS — D485 Neoplasm of uncertain behavior of skin: Secondary | ICD-10-CM | POA: Diagnosis not present

## 2016-01-10 DIAGNOSIS — L570 Actinic keratosis: Secondary | ICD-10-CM | POA: Diagnosis not present

## 2016-01-10 DIAGNOSIS — D689 Coagulation defect, unspecified: Secondary | ICD-10-CM | POA: Diagnosis not present

## 2016-01-10 DIAGNOSIS — N186 End stage renal disease: Secondary | ICD-10-CM | POA: Diagnosis not present

## 2016-01-12 DIAGNOSIS — N186 End stage renal disease: Secondary | ICD-10-CM | POA: Diagnosis not present

## 2016-01-12 DIAGNOSIS — D689 Coagulation defect, unspecified: Secondary | ICD-10-CM | POA: Diagnosis not present

## 2016-01-12 DIAGNOSIS — D631 Anemia in chronic kidney disease: Secondary | ICD-10-CM | POA: Diagnosis not present

## 2016-01-12 DIAGNOSIS — D509 Iron deficiency anemia, unspecified: Secondary | ICD-10-CM | POA: Diagnosis not present

## 2016-01-15 DIAGNOSIS — M79674 Pain in right toe(s): Secondary | ICD-10-CM | POA: Diagnosis not present

## 2016-01-15 DIAGNOSIS — D689 Coagulation defect, unspecified: Secondary | ICD-10-CM | POA: Diagnosis not present

## 2016-01-15 DIAGNOSIS — D509 Iron deficiency anemia, unspecified: Secondary | ICD-10-CM | POA: Diagnosis not present

## 2016-01-15 DIAGNOSIS — M79675 Pain in left toe(s): Secondary | ICD-10-CM | POA: Diagnosis not present

## 2016-01-15 DIAGNOSIS — N186 End stage renal disease: Secondary | ICD-10-CM | POA: Diagnosis not present

## 2016-01-15 DIAGNOSIS — D631 Anemia in chronic kidney disease: Secondary | ICD-10-CM | POA: Diagnosis not present

## 2016-01-15 DIAGNOSIS — I739 Peripheral vascular disease, unspecified: Secondary | ICD-10-CM | POA: Diagnosis not present

## 2016-01-15 DIAGNOSIS — B351 Tinea unguium: Secondary | ICD-10-CM | POA: Diagnosis not present

## 2016-01-17 ENCOUNTER — Ambulatory Visit (INDEPENDENT_AMBULATORY_CARE_PROVIDER_SITE_OTHER): Payer: Medicare Other

## 2016-01-17 DIAGNOSIS — E538 Deficiency of other specified B group vitamins: Secondary | ICD-10-CM

## 2016-01-17 DIAGNOSIS — D509 Iron deficiency anemia, unspecified: Secondary | ICD-10-CM | POA: Diagnosis not present

## 2016-01-17 DIAGNOSIS — D689 Coagulation defect, unspecified: Secondary | ICD-10-CM | POA: Diagnosis not present

## 2016-01-17 DIAGNOSIS — N186 End stage renal disease: Secondary | ICD-10-CM | POA: Diagnosis not present

## 2016-01-17 DIAGNOSIS — D631 Anemia in chronic kidney disease: Secondary | ICD-10-CM | POA: Diagnosis not present

## 2016-01-17 MED ORDER — CYANOCOBALAMIN 1000 MCG/ML IJ SOLN
1000.0000 ug | Freq: Once | INTRAMUSCULAR | Status: AC
  Administered 2016-01-17: 1000 ug via INTRAMUSCULAR

## 2016-01-22 DIAGNOSIS — D631 Anemia in chronic kidney disease: Secondary | ICD-10-CM | POA: Diagnosis not present

## 2016-01-22 DIAGNOSIS — D509 Iron deficiency anemia, unspecified: Secondary | ICD-10-CM | POA: Diagnosis not present

## 2016-01-22 DIAGNOSIS — D689 Coagulation defect, unspecified: Secondary | ICD-10-CM | POA: Diagnosis not present

## 2016-01-22 DIAGNOSIS — N186 End stage renal disease: Secondary | ICD-10-CM | POA: Diagnosis not present

## 2016-01-23 DIAGNOSIS — R079 Chest pain, unspecified: Secondary | ICD-10-CM | POA: Diagnosis not present

## 2016-01-23 DIAGNOSIS — I498 Other specified cardiac arrhythmias: Secondary | ICD-10-CM | POA: Diagnosis not present

## 2016-01-23 DIAGNOSIS — I10 Essential (primary) hypertension: Secondary | ICD-10-CM | POA: Diagnosis not present

## 2016-01-23 DIAGNOSIS — E7800 Pure hypercholesterolemia, unspecified: Secondary | ICD-10-CM | POA: Diagnosis not present

## 2016-01-23 DIAGNOSIS — I2510 Atherosclerotic heart disease of native coronary artery without angina pectoris: Secondary | ICD-10-CM | POA: Diagnosis not present

## 2016-01-23 DIAGNOSIS — N185 Chronic kidney disease, stage 5: Secondary | ICD-10-CM | POA: Diagnosis not present

## 2016-01-24 DIAGNOSIS — D509 Iron deficiency anemia, unspecified: Secondary | ICD-10-CM | POA: Diagnosis not present

## 2016-01-24 DIAGNOSIS — D631 Anemia in chronic kidney disease: Secondary | ICD-10-CM | POA: Diagnosis not present

## 2016-01-24 DIAGNOSIS — N186 End stage renal disease: Secondary | ICD-10-CM | POA: Diagnosis not present

## 2016-01-24 DIAGNOSIS — D689 Coagulation defect, unspecified: Secondary | ICD-10-CM | POA: Diagnosis not present

## 2016-01-26 DIAGNOSIS — D689 Coagulation defect, unspecified: Secondary | ICD-10-CM | POA: Diagnosis not present

## 2016-01-26 DIAGNOSIS — I481 Persistent atrial fibrillation: Secondary | ICD-10-CM | POA: Diagnosis not present

## 2016-01-26 DIAGNOSIS — D509 Iron deficiency anemia, unspecified: Secondary | ICD-10-CM | POA: Diagnosis not present

## 2016-01-26 DIAGNOSIS — N186 End stage renal disease: Secondary | ICD-10-CM | POA: Diagnosis not present

## 2016-01-26 DIAGNOSIS — D631 Anemia in chronic kidney disease: Secondary | ICD-10-CM | POA: Diagnosis not present

## 2016-01-31 DIAGNOSIS — N186 End stage renal disease: Secondary | ICD-10-CM | POA: Diagnosis not present

## 2016-01-31 DIAGNOSIS — D509 Iron deficiency anemia, unspecified: Secondary | ICD-10-CM | POA: Diagnosis not present

## 2016-01-31 DIAGNOSIS — D689 Coagulation defect, unspecified: Secondary | ICD-10-CM | POA: Diagnosis not present

## 2016-01-31 DIAGNOSIS — D631 Anemia in chronic kidney disease: Secondary | ICD-10-CM | POA: Diagnosis not present

## 2016-02-01 DIAGNOSIS — R079 Chest pain, unspecified: Secondary | ICD-10-CM | POA: Diagnosis not present

## 2016-02-05 DIAGNOSIS — D509 Iron deficiency anemia, unspecified: Secondary | ICD-10-CM | POA: Diagnosis not present

## 2016-02-05 DIAGNOSIS — D631 Anemia in chronic kidney disease: Secondary | ICD-10-CM | POA: Diagnosis not present

## 2016-02-05 DIAGNOSIS — N186 End stage renal disease: Secondary | ICD-10-CM | POA: Diagnosis not present

## 2016-02-05 DIAGNOSIS — I4819 Other persistent atrial fibrillation: Secondary | ICD-10-CM | POA: Insufficient documentation

## 2016-02-05 DIAGNOSIS — D689 Coagulation defect, unspecified: Secondary | ICD-10-CM | POA: Diagnosis not present

## 2016-02-06 ENCOUNTER — Ambulatory Visit: Payer: Medicare Other | Admitting: Family Medicine

## 2016-02-07 DIAGNOSIS — N186 End stage renal disease: Secondary | ICD-10-CM | POA: Diagnosis not present

## 2016-02-07 DIAGNOSIS — D509 Iron deficiency anemia, unspecified: Secondary | ICD-10-CM | POA: Diagnosis not present

## 2016-02-07 DIAGNOSIS — D689 Coagulation defect, unspecified: Secondary | ICD-10-CM | POA: Diagnosis not present

## 2016-02-07 DIAGNOSIS — D631 Anemia in chronic kidney disease: Secondary | ICD-10-CM | POA: Diagnosis not present

## 2016-02-09 DIAGNOSIS — I120 Hypertensive chronic kidney disease with stage 5 chronic kidney disease or end stage renal disease: Secondary | ICD-10-CM | POA: Diagnosis not present

## 2016-02-09 DIAGNOSIS — Z992 Dependence on renal dialysis: Secondary | ICD-10-CM | POA: Diagnosis not present

## 2016-02-09 DIAGNOSIS — D631 Anemia in chronic kidney disease: Secondary | ICD-10-CM | POA: Diagnosis not present

## 2016-02-09 DIAGNOSIS — D509 Iron deficiency anemia, unspecified: Secondary | ICD-10-CM | POA: Diagnosis not present

## 2016-02-09 DIAGNOSIS — D689 Coagulation defect, unspecified: Secondary | ICD-10-CM | POA: Diagnosis not present

## 2016-02-09 DIAGNOSIS — N186 End stage renal disease: Secondary | ICD-10-CM | POA: Diagnosis not present

## 2016-02-12 DIAGNOSIS — D689 Coagulation defect, unspecified: Secondary | ICD-10-CM | POA: Diagnosis not present

## 2016-02-12 DIAGNOSIS — D631 Anemia in chronic kidney disease: Secondary | ICD-10-CM | POA: Diagnosis not present

## 2016-02-12 DIAGNOSIS — N186 End stage renal disease: Secondary | ICD-10-CM | POA: Diagnosis not present

## 2016-02-12 DIAGNOSIS — D509 Iron deficiency anemia, unspecified: Secondary | ICD-10-CM | POA: Diagnosis not present

## 2016-02-14 ENCOUNTER — Ambulatory Visit (INDEPENDENT_AMBULATORY_CARE_PROVIDER_SITE_OTHER): Payer: Medicare Other

## 2016-02-14 DIAGNOSIS — E538 Deficiency of other specified B group vitamins: Secondary | ICD-10-CM | POA: Diagnosis not present

## 2016-02-14 MED ORDER — CYANOCOBALAMIN 1000 MCG/ML IJ SOLN
1000.0000 ug | Freq: Once | INTRAMUSCULAR | Status: AC
  Administered 2016-02-14: 1000 ug via INTRAMUSCULAR

## 2016-02-16 DIAGNOSIS — D689 Coagulation defect, unspecified: Secondary | ICD-10-CM | POA: Diagnosis not present

## 2016-02-16 DIAGNOSIS — D631 Anemia in chronic kidney disease: Secondary | ICD-10-CM | POA: Diagnosis not present

## 2016-02-16 DIAGNOSIS — N186 End stage renal disease: Secondary | ICD-10-CM | POA: Diagnosis not present

## 2016-02-16 DIAGNOSIS — D509 Iron deficiency anemia, unspecified: Secondary | ICD-10-CM | POA: Diagnosis not present

## 2016-02-19 DIAGNOSIS — D509 Iron deficiency anemia, unspecified: Secondary | ICD-10-CM | POA: Diagnosis not present

## 2016-02-19 DIAGNOSIS — N186 End stage renal disease: Secondary | ICD-10-CM | POA: Diagnosis not present

## 2016-02-19 DIAGNOSIS — D689 Coagulation defect, unspecified: Secondary | ICD-10-CM | POA: Diagnosis not present

## 2016-02-19 DIAGNOSIS — D631 Anemia in chronic kidney disease: Secondary | ICD-10-CM | POA: Diagnosis not present

## 2016-02-20 DIAGNOSIS — I10 Essential (primary) hypertension: Secondary | ICD-10-CM | POA: Diagnosis not present

## 2016-02-20 DIAGNOSIS — I2510 Atherosclerotic heart disease of native coronary artery without angina pectoris: Secondary | ICD-10-CM | POA: Diagnosis not present

## 2016-02-20 DIAGNOSIS — E7800 Pure hypercholesterolemia, unspecified: Secondary | ICD-10-CM | POA: Diagnosis not present

## 2016-02-20 DIAGNOSIS — N186 End stage renal disease: Secondary | ICD-10-CM | POA: Diagnosis not present

## 2016-02-20 DIAGNOSIS — I481 Persistent atrial fibrillation: Secondary | ICD-10-CM | POA: Diagnosis not present

## 2016-02-21 DIAGNOSIS — D509 Iron deficiency anemia, unspecified: Secondary | ICD-10-CM | POA: Diagnosis not present

## 2016-02-21 DIAGNOSIS — D689 Coagulation defect, unspecified: Secondary | ICD-10-CM | POA: Diagnosis not present

## 2016-02-21 DIAGNOSIS — N186 End stage renal disease: Secondary | ICD-10-CM | POA: Diagnosis not present

## 2016-02-21 DIAGNOSIS — D631 Anemia in chronic kidney disease: Secondary | ICD-10-CM | POA: Diagnosis not present

## 2016-02-23 DIAGNOSIS — D509 Iron deficiency anemia, unspecified: Secondary | ICD-10-CM | POA: Diagnosis not present

## 2016-02-23 DIAGNOSIS — D631 Anemia in chronic kidney disease: Secondary | ICD-10-CM | POA: Diagnosis not present

## 2016-02-23 DIAGNOSIS — D689 Coagulation defect, unspecified: Secondary | ICD-10-CM | POA: Diagnosis not present

## 2016-02-23 DIAGNOSIS — N186 End stage renal disease: Secondary | ICD-10-CM | POA: Diagnosis not present

## 2016-02-26 DIAGNOSIS — C44622 Squamous cell carcinoma of skin of right upper limb, including shoulder: Secondary | ICD-10-CM | POA: Diagnosis not present

## 2016-02-26 DIAGNOSIS — D689 Coagulation defect, unspecified: Secondary | ICD-10-CM | POA: Diagnosis not present

## 2016-02-26 DIAGNOSIS — D509 Iron deficiency anemia, unspecified: Secondary | ICD-10-CM | POA: Diagnosis not present

## 2016-02-26 DIAGNOSIS — N186 End stage renal disease: Secondary | ICD-10-CM | POA: Diagnosis not present

## 2016-02-26 DIAGNOSIS — D631 Anemia in chronic kidney disease: Secondary | ICD-10-CM | POA: Diagnosis not present

## 2016-02-27 ENCOUNTER — Ambulatory Visit (INDEPENDENT_AMBULATORY_CARE_PROVIDER_SITE_OTHER): Payer: Medicare Other | Admitting: Family Medicine

## 2016-02-27 VITALS — BP 130/58 | HR 64 | Temp 97.9°F | Resp 16 | Wt 195.0 lb

## 2016-02-27 DIAGNOSIS — K219 Gastro-esophageal reflux disease without esophagitis: Secondary | ICD-10-CM | POA: Diagnosis not present

## 2016-02-27 DIAGNOSIS — N185 Chronic kidney disease, stage 5: Secondary | ICD-10-CM

## 2016-02-27 DIAGNOSIS — R413 Other amnesia: Secondary | ICD-10-CM

## 2016-02-27 NOTE — Progress Notes (Signed)
Patient ID: Frank Huang, male   DOB: Apr 19, 1932, 82 y.o.   MRN: [id number]    Subjective:  HPI Pt is here for a 6 month follow up for Dementia. Aricept was increased to 10 mg daily at last OV. Wife says that pt is about the same, no worse, no better. Pt had skin cancer removed from hand yesterday and will have them removed next week along with his B12 injection.   Prior to Admission medications   Medication Sig Start Date End Date Taking? Authorizing Provider  aspirin EC 81 MG tablet Take 81 mg by mouth daily.   Yes Historical Provider, MD  Cyanocobalamin (VITAMIN B 12 PO) Take by mouth.   Yes Historical Provider, MD  donepezil (ARICEPT) 10 MG tablet Take 1 tablet (10 mg total) by mouth at bedtime. 01/02/16  Yes  Maceo Pro., MD  lovastatin (MEVACOR) 40 MG tablet TAKE 1 TABLET BY MOUTH EVERY DAY FOR HIGH CHOLESTEROL 08/10/14  Yes Historical Provider, MD  Multiple Vitamin (MULTI-VITAMINS) TABS Take by mouth.   Yes Historical Provider, MD  omeprazole (PRILOSEC) 20 MG capsule Take 1 capsule (20 mg total) by mouth daily. TAKE 1 CAPSULE BY MOUTH EVERY DAY. 10/26/15  Yes [name]., MD    Patient Active Problem List   Diagnosis Date Noted  . Persistent atrial fibrillation ([place]) 02/05/2016  . B12 deficiency 07/25/2015  . Deficiency of vitamin B 07/25/2015  . Chest pain 07/01/2015  . Multiple myeloma ([place]) 04/12/2015  . Arteriosclerosis of coronary artery 04/09/2015  . CAFL (chronic airflow limitation) ([place]) 04/09/2015  . Chronic kidney disease requiring chronic dialysis ([place]) 04/09/2015  . Gastro-esophageal reflux disease without esophagitis 04/09/2015  . Gout 04/09/2015  . H/O acute myocardial infarction 04/09/2015  . HLD (hyperlipidemia) 04/09/2015  . BP (high blood pressure) 04/09/2015  . Bad memory 04/09/2015  . 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 Kidney failure 04/09/2015  . End-stage renal disease ([place]) 04/09/2015  . Chronic  kidney disease (CKD), stage V ([place]) 07/20/2013  . Chronic kidney disease, stage V ([place]) 07/20/2013  . Absolute anemia 03/31/2013  . Neuropathy ([place]) 03/31/2013    Past Medical History  Diagnosis Date  . Chronic kidney disease   . Cancer ([place])   . Multiple myeloma ([place])   . COPD (chronic obstructive pulmonary disease) ([place])   . Shortness of breath dyspnea   . Neuropathy ([place])   . GERD (gastroesophageal reflux disease)   . Stroke (cerebrum) (HCC)     weakness Lt hand  . Coronary artery disease   . Myocardial infarction [facility] [employer]) 2005    Social History   Social History  . Marital Status: Married    Spouse Name: N/A  . Number of Children: N/A  . Years of Education: N/A   Occupational History  . Not on file.   Social History Main Topics  . Smoking status: Current Every Day Smoker -- 0.25 packs/day    Types: Cigarettes  . Smokeless tobacco: Not on file     Comment: smokes about 3 cigarettes per week  . Alcohol Use: No  . Drug Use: No  . Sexual Activity: Not on file   Other Topics Concern  . Not on file   Social History Narrative    No Known Allergies  Review of Systems  Constitutional: Negative.   HENT: Negative.   Eyes: Negative.   Respiratory: Negative.   Cardiovascular: Negative.   Gastrointestinal: Negative.   Genitourinary: Negative.  Musculoskeletal: Negative.   Skin: Negative.   Neurological: Negative.   Endo/Heme/Allergies: Negative.   Psychiatric/Behavioral: Positive for memory loss.    Immunization History  Administered Date(s) Administered  . Influenza, High Dose Seasonal PF 08/03/2015   Objective:  BP 130/58 mmHg  Pulse 64  Temp(Src) 97.9 F (36.6 C) (Oral)  Resp 16  Wt 195 lb (88.451 kg)  Physical Exam  Constitutional: He is oriented to person, place, and time and well-developed, well-nourished, and in no distress.  HENT:  Head: Normocephalic and atraumatic.  Right Ear: External ear normal.  Left Ear: External ear normal.  Nose:  Nose normal.  Eyes: Conjunctivae and EOM are normal. Pupils are equal, round, and reactive to light.  Neck: Normal range of motion. Neck supple.  Cardiovascular: Normal rate, regular rhythm, normal heart sounds and intact distal pulses.   Pulmonary/Chest: Effort normal and breath sounds normal.  Abdominal: Soft.  Musculoskeletal: Normal range of motion.  Neurological: He is alert and oriented to person, place, and time. Gait normal. GCS score is 15.  Skin: Skin is warm and dry.  Psychiatric: Mood, memory, affect and judgment normal.    Lab Results  Component Value Date   WBC 5.8 01/02/2016   HGB 11.7* 01/02/2016   HCT 35.1* 01/02/2016   PLT 136* 01/02/2016   GLUCOSE 93 01/02/2016   CHOL 163 08/30/2015   TRIG 78 08/30/2015   HDL 43 08/30/2015   LDLCALC 104* 08/30/2015   TSH 1.080 08/30/2015   INR 1.0 12/11/2013   HGBA1C 4.7* 08/30/2015    CMP     Component Value Date/Time   NA 135 01/02/2016 1023   NA 140 09/29/2014 0848   K 3.9 01/02/2016 1023   K 3.8 09/29/2014 0848   CL 94* 01/02/2016 1023   CL 98 09/29/2014 0848   CO2 31 01/02/2016 1023   CO2 35* 09/29/2014 0848   GLUCOSE 93 01/02/2016 1023   GLUCOSE 117* 09/29/2014 0848   BUN 35* 01/02/2016 1023   BUN 23* 09/29/2014 0848   CREATININE 4.78* 01/02/2016 1023   CREATININE 4.49* 09/29/2014 0848   CALCIUM 9.0 01/02/2016 1023   CALCIUM 9.2 11/22/2014 1042   PROT 8.0 01/02/2016 1023   PROT 7.4 09/29/2014 0848   ALBUMIN 4.2 01/02/2016 1023   ALBUMIN 3.3* 09/29/2014 0848   AST 15 01/02/2016 1023   AST 16 09/29/2014 0848   ALT 8* 01/02/2016 1023   ALT 14 09/29/2014 0848   ALKPHOS 65 01/02/2016 1023   ALKPHOS 91 09/29/2014 0848   BILITOT 1.1 01/02/2016 1023   BILITOT 0.6 09/29/2014 0848   GFRNONAA 10* 01/02/2016 1023   GFRNONAA 14* 09/29/2014 0848   GFRNONAA 19* 02/08/2014 2042   GFRAA 12* 01/02/2016 1023   GFRAA 16* 09/29/2014 0848   GFRAA 22* 02/08/2014 2042    Assessment and Plan :  1. Bad memory/Early  Alzheimer Disease Stable. Follow up with Neurology.   2. Chronic kidney disease (CKD), stage V (HCC)/on hemodialysis   3. Gastro-esophageal reflux disease without esophagitis  I have done the exam and reviewed the above chart and it is accurate to the best of my knowledge.   Patient was seen and examined by Dr. Miguel Aschoff, and noted scribed by Webb Laws, [place] MD [facility] 02/27/2016 11:10 AM

## 2016-02-28 DIAGNOSIS — N186 End stage renal disease: Secondary | ICD-10-CM | POA: Diagnosis not present

## 2016-02-28 DIAGNOSIS — D689 Coagulation defect, unspecified: Secondary | ICD-10-CM | POA: Diagnosis not present

## 2016-02-28 DIAGNOSIS — D509 Iron deficiency anemia, unspecified: Secondary | ICD-10-CM | POA: Diagnosis not present

## 2016-02-28 DIAGNOSIS — D631 Anemia in chronic kidney disease: Secondary | ICD-10-CM | POA: Diagnosis not present

## 2016-02-29 DIAGNOSIS — E538 Deficiency of other specified B group vitamins: Secondary | ICD-10-CM | POA: Diagnosis not present

## 2016-02-29 DIAGNOSIS — Z8673 Personal history of transient ischemic attack (TIA), and cerebral infarction without residual deficits: Secondary | ICD-10-CM | POA: Diagnosis not present

## 2016-02-29 DIAGNOSIS — G629 Polyneuropathy, unspecified: Secondary | ICD-10-CM | POA: Diagnosis not present

## 2016-02-29 DIAGNOSIS — R413 Other amnesia: Secondary | ICD-10-CM | POA: Diagnosis not present

## 2016-03-01 DIAGNOSIS — N186 End stage renal disease: Secondary | ICD-10-CM | POA: Diagnosis not present

## 2016-03-01 DIAGNOSIS — D689 Coagulation defect, unspecified: Secondary | ICD-10-CM | POA: Diagnosis not present

## 2016-03-01 DIAGNOSIS — D631 Anemia in chronic kidney disease: Secondary | ICD-10-CM | POA: Diagnosis not present

## 2016-03-01 DIAGNOSIS — D509 Iron deficiency anemia, unspecified: Secondary | ICD-10-CM | POA: Diagnosis not present

## 2016-03-04 DIAGNOSIS — D509 Iron deficiency anemia, unspecified: Secondary | ICD-10-CM | POA: Diagnosis not present

## 2016-03-04 DIAGNOSIS — N186 End stage renal disease: Secondary | ICD-10-CM | POA: Diagnosis not present

## 2016-03-04 DIAGNOSIS — D631 Anemia in chronic kidney disease: Secondary | ICD-10-CM | POA: Diagnosis not present

## 2016-03-04 DIAGNOSIS — D689 Coagulation defect, unspecified: Secondary | ICD-10-CM | POA: Diagnosis not present

## 2016-03-05 ENCOUNTER — Emergency Department
Admission: EM | Admit: 2016-03-05 | Discharge: 2016-03-05 | Disposition: A | Discharge status: Home / Self Care | Payer: Medicare Other | Attending: Emergency Medicine | Admitting: Emergency Medicine

## 2016-03-05 ENCOUNTER — Emergency Department: Payer: Medicare Other

## 2016-03-05 ENCOUNTER — Encounter: Payer: Self-pay | Admitting: *Deleted

## 2016-03-05 DIAGNOSIS — S20211A Contusion of right front wall of thorax, initial encounter: Secondary | ICD-10-CM | POA: Diagnosis not present

## 2016-03-05 DIAGNOSIS — W182XXA Fall in (into) shower or empty bathtub, initial encounter: Secondary | ICD-10-CM | POA: Diagnosis not present

## 2016-03-05 DIAGNOSIS — E785 Hyperlipidemia, unspecified: Secondary | ICD-10-CM | POA: Diagnosis not present

## 2016-03-05 DIAGNOSIS — S299XXA Unspecified injury of thorax, initial encounter: Secondary | ICD-10-CM | POA: Diagnosis present

## 2016-03-05 DIAGNOSIS — F17210 Nicotine dependence, cigarettes, uncomplicated: Secondary | ICD-10-CM | POA: Insufficient documentation

## 2016-03-05 DIAGNOSIS — Y929 Unspecified place or not applicable: Secondary | ICD-10-CM | POA: Diagnosis not present

## 2016-03-05 DIAGNOSIS — I120 Hypertensive chronic kidney disease with stage 5 chronic kidney disease or end stage renal disease: Secondary | ICD-10-CM | POA: Insufficient documentation

## 2016-03-05 DIAGNOSIS — Z8579 Personal history of other malignant neoplasms of lymphoid, hematopoietic and related tissues: Secondary | ICD-10-CM | POA: Diagnosis not present

## 2016-03-05 DIAGNOSIS — I252 Old myocardial infarction: Secondary | ICD-10-CM | POA: Insufficient documentation

## 2016-03-05 DIAGNOSIS — Z79899 Other long term (current) drug therapy: Secondary | ICD-10-CM | POA: Insufficient documentation

## 2016-03-05 DIAGNOSIS — Y9389 Activity, other specified: Secondary | ICD-10-CM | POA: Insufficient documentation

## 2016-03-05 DIAGNOSIS — I481 Persistent atrial fibrillation: Secondary | ICD-10-CM | POA: Diagnosis not present

## 2016-03-05 DIAGNOSIS — R0781 Pleurodynia: Secondary | ICD-10-CM | POA: Diagnosis not present

## 2016-03-05 DIAGNOSIS — Y999 Unspecified external cause status: Secondary | ICD-10-CM | POA: Insufficient documentation

## 2016-03-05 DIAGNOSIS — J449 Chronic obstructive pulmonary disease, unspecified: Secondary | ICD-10-CM | POA: Insufficient documentation

## 2016-03-05 DIAGNOSIS — Z7982 Long term (current) use of aspirin: Secondary | ICD-10-CM | POA: Insufficient documentation

## 2016-03-05 DIAGNOSIS — Z992 Dependence on renal dialysis: Secondary | ICD-10-CM | POA: Insufficient documentation

## 2016-03-05 DIAGNOSIS — W19XXXA Unspecified fall, initial encounter: Secondary | ICD-10-CM

## 2016-03-05 DIAGNOSIS — N186 End stage renal disease: Secondary | ICD-10-CM | POA: Insufficient documentation

## 2016-03-05 DIAGNOSIS — Z8673 Personal history of transient ischemic attack (TIA), and cerebral infarction without residual deficits: Secondary | ICD-10-CM | POA: Insufficient documentation

## 2016-03-05 DIAGNOSIS — I2510 Atherosclerotic heart disease of native coronary artery without angina pectoris: Secondary | ICD-10-CM | POA: Insufficient documentation

## 2016-03-05 NOTE — ED Notes (Signed)
Pt discharged home after verbalizing understanding of discharge instructions; nad noted. 

## 2016-03-05 NOTE — Discharge Instructions (Signed)

## 2016-03-05 NOTE — ED Notes (Signed)
States he was getting out of the shower and slipped on a rug, denies any LOc or hitting his head, states mid back pain

## 2016-03-05 NOTE — ED Provider Notes (Signed)
[facility] Emergency Department Provider Note  ____________________________________________  Time seen: Approximately 4:20 PM  I have reviewed the triage vital signs and the nursing notes.   HISTORY  Chief Complaint Back Pain    HPI Frank Huang is a 82 y.o. male who presents for evaluation of right posterior back pain after falling and hitting the tub earlier this morning. Patient reports it hurts to take a deep breath or when changing positions from sitting to standing.   Past Medical History  Diagnosis Date  . Chronic kidney disease   . Cancer ([place])   . Multiple myeloma ([place])   . COPD (chronic obstructive pulmonary disease) ([place])   . Shortness of breath dyspnea   . Neuropathy ([place])   . GERD (gastroesophageal reflux disease)   . Stroke (cerebrum) (HCC)     weakness Lt hand  . Coronary artery disease   . Myocardial infarction [facility]) 2005    Patient Active Problem List   Diagnosis Date Noted  . Persistent atrial fibrillation ([place]) 02/05/2016  . B12 deficiency 07/25/2015  . Deficiency of vitamin B 07/25/2015  . Chest pain 07/01/2015  . Multiple myeloma ([place]) 04/12/2015  . Arteriosclerosis of coronary artery 04/09/2015  . CAFL (chronic airflow limitation) ([place]) 04/09/2015  . Chronic kidney disease requiring chronic dialysis ([place]) 04/09/2015  . Gastro-esophageal reflux disease without esophagitis 04/09/2015  . Gout 04/09/2015  . H/O acute myocardial infarction 04/09/2015  . HLD (hyperlipidemia) 04/09/2015  . BP (high blood pressure) 04/09/2015  . Bad memory 04/09/2015  . 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 Kidney failure 04/09/2015  . End-stage renal disease ([place]) 04/09/2015  . Chronic kidney disease (CKD), stage V ([place]) 07/20/2013  . Chronic kidney disease, stage V ([place]) 07/20/2013  . Absolute anemia 03/31/2013  . Neuropathy ([place]) 03/31/2013    Past Surgical History  Procedure Laterality Date  .  Total hip arthroplasty Left   . Appendectomy    . Cholecystectomy    . Av fistula placement Left   . Shoulder arthroscopy with open rotator cuff repair Left 09/19/2015    Procedure: SHOULDER ARTHROSCOPY , subacromial decompression, debridement;  Surgeon: Corky Mull, MD;  Location: [facility] ORS;  Service: Orthopedics;  Laterality: Left;    Current Outpatient Rx  Name  Route  Sig  Dispense  Refill  . aspirin EC 81 MG tablet   Oral   Take 81 mg by mouth daily.         . Cyanocobalamin (VITAMIN B 12 PO)   Oral   Take by mouth.         . donepezil (ARICEPT) 10 MG tablet   Oral   Take 1 tablet (10 mg total) by mouth at bedtime.   90 tablet   3   . lovastatin (MEVACOR) 40 MG tablet      TAKE 1 TABLET BY MOUTH EVERY DAY FOR HIGH CHOLESTEROL         . Multiple Vitamin (MULTI-VITAMINS) TABS   Oral   Take by mouth.         Marland Kitchen omeprazole (PRILOSEC) 20 MG capsule   Oral   Take 1 capsule (20 mg total) by mouth daily. TAKE 1 CAPSULE BY MOUTH EVERY DAY.   90 capsule   3     Allergies Review of patient's allergies indicates no known allergies.  Family History  Problem Relation Age of Onset  . Alzheimer's disease Mother   . Kidney failure Father   .  Bone cancer Sister   . Stomach cancer Sister     Social History Social History  Substance Use Topics  . Smoking status: Current Every Day Smoker -- 0.25 packs/day    Types: Cigarettes  . Smokeless tobacco: None     Comment: smokes about 3 cigarettes per week  . Alcohol Use: No    Review of Systems Constitutional: No fever/chills Eyes: No visual changes. ENT: No sore throat. Cardiovascular: Denies chest pain. Respiratory: Denies shortness of breath. Gastrointestinal: No abdominal pain.  No nausea, no vomiting.  No diarrhea.  No constipation. Genitourinary: Negative for dysuria. Musculoskeletal: Positive for right lower back pain. Skin: Negative for rash. Neurological: Negative for headaches, focal weakness or  numbness.  10-point ROS otherwise negative.  ____________________________________________   PHYSICAL EXAM:  VITAL SIGNS: ED Triage Vitals  Enc Vitals Group     BP 03/05/16 1545 113/72 mmHg     Pulse Rate 03/05/16 1545 73     Resp 03/05/16 1545 18     Temp 03/05/16 1545 98.4 F (36.9 C)     Temp Source 03/05/16 1545 Oral     SpO2 --      Weight 03/05/16 1545 194 lb (87.998 kg)     Height 03/05/16 1545 _0  (1.905 m)     Head Cir --      Peak Flow --      Pain Score 03/05/16 1546 4     Pain Loc --      Pain Edu? --      Excl. in [place]? --     Constitutional: Alert and oriented. Well appearing and in no acute distress. Neck: No stridor. Full range of motion nontender  Cardiovascular: Normal rate, regular rhythm. Grossly normal heart sounds.  Good peripheral circulation. Respiratory: Normal respiratory effort.  No retractions. Lungs CTAB. Musculoskeletal: No lower extremity tenderness nor edema.  No joint effusions.Point tenderness noted at the right posterior rib cage. Neurologic:  Normal speech and language. No gross focal neurologic deficits are appreciated. No gait instability. Skin:  Skin is warm, dry and intact. No rash noted.No ecchymosis or bruising noted. Psychiatric: Mood and affect are normal. Speech and behavior are normal.  ____________________________________________   LABS (all labs ordered are listed, but only abnormal results are displayed)  Labs Reviewed - No data to display ____________________________________________  RADIOLOGY  No acute osseous findings. ____________________________________________   PROCEDURES  Procedure(s) performed: None  Critical Care performed: No  ____________________________________________   INITIAL IMPRESSION / ASSESSMENT AND PLAN / ED COURSE  Pertinent labs & imaging results that were available during my care of the patient were reviewed by me and considered in my medical decision making (see chart for  details).  Status post fall with posterior rib contusion. No acute fracture noted. Patient follow-up PCP or return to ER with any worsening symptomology. ____________________________________________   FINAL CLINICAL IMPRESSION(S) / ED DIAGNOSES  Final diagnoses:  Fall, initial encounter  Contusion of rib, right, initial encounter     This chart was dictated using voice recognition software/Dragon. Despite best efforts to proofread, errors can occur which can change the meaning. Any change was purely unintentional.   Arlyss Repress, PA-C 03/05/16 1835  Orbie Pyo, MD 03/05/16 2127

## 2016-03-08 DIAGNOSIS — D509 Iron deficiency anemia, unspecified: Secondary | ICD-10-CM | POA: Diagnosis not present

## 2016-03-08 DIAGNOSIS — D631 Anemia in chronic kidney disease: Secondary | ICD-10-CM | POA: Diagnosis not present

## 2016-03-08 DIAGNOSIS — N186 End stage renal disease: Secondary | ICD-10-CM | POA: Diagnosis not present

## 2016-03-08 DIAGNOSIS — D689 Coagulation defect, unspecified: Secondary | ICD-10-CM | POA: Diagnosis not present

## 2016-03-10 DIAGNOSIS — Z992 Dependence on renal dialysis: Secondary | ICD-10-CM | POA: Diagnosis not present

## 2016-03-10 DIAGNOSIS — I120 Hypertensive chronic kidney disease with stage 5 chronic kidney disease or end stage renal disease: Secondary | ICD-10-CM | POA: Diagnosis not present

## 2016-03-10 DIAGNOSIS — N186 End stage renal disease: Secondary | ICD-10-CM | POA: Diagnosis not present

## 2016-03-11 DIAGNOSIS — D509 Iron deficiency anemia, unspecified: Secondary | ICD-10-CM | POA: Diagnosis not present

## 2016-03-11 DIAGNOSIS — N186 End stage renal disease: Secondary | ICD-10-CM | POA: Diagnosis not present

## 2016-03-11 DIAGNOSIS — D689 Coagulation defect, unspecified: Secondary | ICD-10-CM | POA: Diagnosis not present

## 2016-03-12 ENCOUNTER — Ambulatory Visit (INDEPENDENT_AMBULATORY_CARE_PROVIDER_SITE_OTHER): Payer: Medicare Other | Admitting: Family Medicine

## 2016-03-12 ENCOUNTER — Ambulatory Visit
Admission: RE | Admit: 2016-03-12 | Discharge: 2016-03-12 | Disposition: A | Discharge status: Home / Self Care | Payer: Medicare Other | Source: Ambulatory Visit | Attending: Family Medicine | Admitting: Family Medicine

## 2016-03-12 ENCOUNTER — Encounter: Payer: Self-pay | Admitting: Family Medicine

## 2016-03-12 VITALS — BP 122/60 | HR 84 | Temp 97.9°F | Resp 16 | Wt 194.0 lb

## 2016-03-12 DIAGNOSIS — R05 Cough: Secondary | ICD-10-CM | POA: Diagnosis not present

## 2016-03-12 DIAGNOSIS — J441 Chronic obstructive pulmonary disease with (acute) exacerbation: Secondary | ICD-10-CM | POA: Insufficient documentation

## 2016-03-12 DIAGNOSIS — R059 Cough, unspecified: Secondary | ICD-10-CM

## 2016-03-12 DIAGNOSIS — R110 Nausea: Secondary | ICD-10-CM

## 2016-03-12 MED ORDER — DOXYCYCLINE HYCLATE 100 MG PO TABS: 100.0000 mg | ORAL_TABLET | Freq: Two times a day (BID) | ORAL | Status: DC

## 2016-03-12 MED ORDER — PREDNISONE 10 MG (21) PO TBPK: 10.0000 mg | ORAL_TABLET | Freq: Every day | ORAL | Status: DC

## 2016-03-12 MED ORDER — PROMETHAZINE HCL 25 MG PO TABS: 25.0000 mg | ORAL_TABLET | ORAL | Status: DC | PRN

## 2016-03-12 NOTE — Progress Notes (Signed)
Patient ID: Frank Huang, male   DOB: November 13, 1931, 82 y.o.   MRN: [id number]    Subjective:  HPI Pt is here today for cough and stomach discomfort. He reports that the cough started about 4 days ago and has gotten worse. It is usually a dry cough but sometimes will get sputum up, but does not remember what color it was. He reports that he coughed so hard that he felt like he was going to vomit. Pt reports that he feel weak and off balance, the last time he remembered feeling close to this way was when he had pneumonia. He reports that this morning his right ear started hurting. Wife is concerned about the ear because he had a multiple myoma on the same side of his head that the ear is hurting.   Abdominal discomfort started 1 day ago. He reports that it is worse when he lays down flat. When he gets up from a chair or coughs, he hurts. The "discomfort" kept him away last night. He thinks this is related to the cough. No fever. No hemoptysis.  Prior to Admission medications   Medication Sig Start Date End Date Taking? Authorizing Provider  aspirin EC 81 MG tablet Take 81 mg by mouth daily.   Yes Historical Provider, MD  Cyanocobalamin (VITAMIN B 12 PO) Take by mouth.   Yes Historical Provider, MD  donepezil (ARICEPT) 10 MG tablet Take 1 tablet (10 mg total) by mouth at bedtime. 01/02/16  Yes Richard Maceo Pro., MD  lovastatin (MEVACOR) 40 MG tablet TAKE 1 TABLET BY MOUTH EVERY DAY FOR HIGH CHOLESTEROL 08/10/14  Yes Historical Provider, MD  Multiple Vitamin (MULTI-VITAMINS) TABS Take by mouth.   Yes Historical Provider, MD  omeprazole (PRILOSEC) 20 MG capsule Take 1 capsule (20 mg total) by mouth daily. TAKE 1 CAPSULE BY MOUTH EVERY DAY. 10/26/15  Yes [name]., MD    Patient Active Problem List   Diagnosis Date Noted  . Persistent atrial fibrillation ([place]) 02/05/2016  . B12 deficiency 07/25/2015  . Deficiency of vitamin B 07/25/2015  . Chest pain 07/01/2015  . Multiple myeloma ([place])  04/12/2015  . Arteriosclerosis of coronary artery 04/09/2015  . CAFL (chronic airflow limitation) ([place]) 04/09/2015  . Chronic kidney disease requiring chronic dialysis ([place]) 04/09/2015  . Gastro-esophageal reflux disease without esophagitis 04/09/2015  . Gout 04/09/2015  . H/O acute myocardial infarction 04/09/2015  . HLD (hyperlipidemia) 04/09/2015  . BP (high blood pressure) 04/09/2015  . Bad memory 04/09/2015  . 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 Kidney failure 04/09/2015  . End-stage renal disease ([place]) 04/09/2015  . Chronic kidney disease (CKD), stage V ([place]) 07/20/2013  . Chronic kidney disease, stage V ([place]) 07/20/2013  . Absolute anemia 03/31/2013  . Neuropathy ([place]) 03/31/2013    Past Medical History  Diagnosis Date  . Chronic kidney disease   . Cancer ([place])   . Multiple myeloma ([place])   . COPD (chronic obstructive pulmonary disease) ([place])   . Shortness of breath dyspnea   . Neuropathy ([place])   . GERD (gastroesophageal reflux disease)   . Stroke (cerebrum) (HCC)     weakness Lt hand  . Coronary artery disease   . Myocardial infarction [facility]) 2005    Social History   Social History  . Marital Status: Married    Spouse Name: N/A  . Number of Children: N/A  . Years of Education: N/A   Occupational History  .  Not on file.   Social History Main Topics  . Smoking status: Current Every Day Smoker -- 0.25 packs/day    Types: Cigarettes  . Smokeless tobacco: Not on file     Comment: smokes about 3 cigarettes per week  . Alcohol Use: No  . Drug Use: No  . Sexual Activity: Not on file   Other Topics Concern  . Not on file   Social History Narrative    No Known Allergies  Review of Systems  Constitutional: Positive for malaise/fatigue.  HENT: Positive for congestion and ear pain.        Post nasal drainage.  Eyes: Negative.   Respiratory: Positive for cough.   Cardiovascular: Negative.   Gastrointestinal:  Negative.   Genitourinary: Negative.   Musculoskeletal: Negative.   Skin: Negative.   Neurological: Positive for weakness.  Endo/Heme/Allergies: Negative.   Psychiatric/Behavioral: Negative.     Immunization History  Administered Date(s) Administered  . Influenza, High Dose Seasonal PF 08/03/2015   Objective:  BP 122/60 mmHg  Pulse 84  Temp(Src) 97.9 F (36.6 C) (Oral)  Resp 16  Wt 194 lb (87.998 kg)  SpO2 97%  Physical Exam  Constitutional: He is oriented to person, place, and time and well-developed, well-nourished, and in no distress.  HENT:  Head: Normocephalic and atraumatic.  Right Ear: External ear normal.  Left Ear: External ear normal.  Nose: Nose normal.  Mouth/Throat: Oropharynx is clear and moist.  Cardiovascular: Normal rate, regular rhythm, normal heart sounds and intact distal pulses.   Pulmonary/Chest: Effort normal and breath sounds normal.  Abdominal: Soft. Bowel sounds are normal. There is tenderness.  Musculoskeletal: Normal range of motion.  Lymphadenopathy:    He has cervical adenopathy (mid anterior right ).  Neurological: He is alert and oriented to person, place, and time. He has normal reflexes. Gait normal. GCS score is 15.  Skin: Skin is warm and dry.  Psychiatric: Mood, memory, affect and judgment normal.    Lab Results  Component Value Date   WBC 5.8 01/02/2016   HGB 11.7* 01/02/2016   HCT 35.1* 01/02/2016   PLT 136* 01/02/2016   GLUCOSE 93 01/02/2016   CHOL 163 08/30/2015   TRIG 78 08/30/2015   HDL 43 08/30/2015   LDLCALC 104* 08/30/2015   TSH 1.080 08/30/2015   INR 1.0 12/11/2013   HGBA1C 4.7* 08/30/2015    CMP     Component Value Date/Time   NA 135 01/02/2016 1023   NA 140 09/29/2014 0848   K 3.9 01/02/2016 1023   K 3.8 09/29/2014 0848   CL 94* 01/02/2016 1023   CL 98 09/29/2014 0848   CO2 31 01/02/2016 1023   CO2 35* 09/29/2014 0848   GLUCOSE 93 01/02/2016 1023   GLUCOSE 117* 09/29/2014 0848   BUN 35* 01/02/2016  1023   BUN 23* 09/29/2014 0848   CREATININE 4.78* 01/02/2016 1023   CREATININE 4.49* 09/29/2014 0848   CALCIUM 9.0 01/02/2016 1023   CALCIUM 9.2 11/22/2014 1042   PROT 8.0 01/02/2016 1023   PROT 7.4 09/29/2014 0848   ALBUMIN 4.2 01/02/2016 1023   ALBUMIN 3.3* 09/29/2014 0848   AST 15 01/02/2016 1023   AST 16 09/29/2014 0848   ALT 8* 01/02/2016 1023   ALT 14 09/29/2014 0848   ALKPHOS 65 01/02/2016 1023   ALKPHOS 91 09/29/2014 0848   BILITOT 1.1 01/02/2016 1023   BILITOT 0.6 09/29/2014 0848   GFRNONAA 10* 01/02/2016 1023   GFRNONAA 14* 09/29/2014 0848   GFRNONAA  19* 02/08/2014 2042   GFRAA 12* 01/02/2016 1023   GFRAA 16* 09/29/2014 0848   GFRAA 22* 02/08/2014 2042    Assessment and Plan :  1. Cough  - DG Chest 2 View; Future - promethazine (PHENERGAN) 25 MG tablet; Take 1 tablet (25 mg total) by mouth every 4 (four) hours as needed for nausea or vomiting (nausea or cough).  Dispense: 45 tablet; Refill: 0  2. COPD exacerbation ([place])  - DG Chest 2 View; Future - doxycycline (VIBRA-TABS) 100 MG tablet; Take 1 tablet (100 mg total) by mouth 2 (two) times daily.  Dispense: 20 tablet; Refill: 0 - predniSONE (STERAPRED UNI-PAK 21 TAB) 10 MG (21) TBPK tablet; Take 1 tablet (10 mg total) by mouth daily.  Dispense: 21 tablet; Refill: 0  3. Nausea This is resolved. We'll write the Phenergan so if it recurs. - promethazine (PHENERGAN) 25 MG tablet; Take 1 tablet (25 mg total) by mouth every 4 (four) hours as needed for nausea or vomiting (nausea or cough).  Dispense: 45 tablet; Refill: 0 4. Renal failure On hemodialysis 5. Early Alzheimer's disease 6. Abdominal pain This is a very mild today. I think it might be muscular due to the coughing. Follow-up later this week. I have done the exam and reviewed the above chart and it is accurate to the best of my knowledge.  Patient was seen and examined by Dr. Miguel Aschoff, and noted scribed by Webb Laws, [place]  MD [facility] 03/12/2016 11:02 AM

## 2016-03-12 NOTE — Patient Instructions (Addendum)
Increase fluids and Robitussin DM for cough

## 2016-03-13 ENCOUNTER — Ambulatory Visit: Payer: Self-pay

## 2016-03-14 ENCOUNTER — Ambulatory Visit (INDEPENDENT_AMBULATORY_CARE_PROVIDER_SITE_OTHER): Payer: Medicare Other | Admitting: Family Medicine

## 2016-03-14 VITALS — BP 118/64 | HR 86 | Temp 98.4°F | Resp 14 | Wt 194.0 lb

## 2016-03-14 DIAGNOSIS — E538 Deficiency of other specified B group vitamins: Secondary | ICD-10-CM | POA: Diagnosis not present

## 2016-03-14 DIAGNOSIS — W19XXXD Unspecified fall, subsequent encounter: Secondary | ICD-10-CM

## 2016-03-14 DIAGNOSIS — R234 Changes in skin texture: Secondary | ICD-10-CM

## 2016-03-14 DIAGNOSIS — R413 Other amnesia: Secondary | ICD-10-CM

## 2016-03-14 MED ORDER — CYANOCOBALAMIN 1000 MCG/ML IJ SOLN
1000.0000 ug | Freq: Once | INTRAMUSCULAR | Status: AC
  Administered 2016-03-14: 1000 ug via INTRAMUSCULAR

## 2016-03-14 NOTE — Progress Notes (Signed)
Patient ID: Frank Huang, male   DOB: [birth year], 82 y.o.   MRN: [id number]    Subjective:  HPI  Patient is here for Vitamin B12 injection.  Patient is also to follow up on memory impairment and falls. He was suppose to have had his sutures taking out today from his right hand but patient accidentally scrapped the area and ended up getting sutures out with tweezers because they already broke off.  Prior to Admission medications   Medication Sig Start Date End Date Taking? Authorizing Provider  aspirin EC 81 MG tablet Take 81 mg by mouth daily.   Yes Historical Provider, MD  Cyanocobalamin (VITAMIN B 12 PO) Take by mouth.   Yes Historical Provider, MD  donepezil (ARICEPT) 10 MG tablet Take 1 tablet (10 mg total) by mouth at bedtime. 01/02/16  Yes Richard Maceo Pro., MD  doxycycline (VIBRA-TABS) 100 MG tablet Take 1 tablet (100 mg total) by mouth 2 (two) times daily. 03/12/16  Yes Richard Maceo Pro., MD  lovastatin (MEVACOR) 40 MG tablet TAKE 1 TABLET BY MOUTH EVERY DAY FOR HIGH CHOLESTEROL 08/10/14  Yes Historical Provider, MD  Multiple Vitamin (MULTI-VITAMINS) TABS Take by mouth.   Yes Historical Provider, MD  omeprazole (PRILOSEC) 20 MG capsule Take 1 capsule (20 mg total) by mouth daily. TAKE 1 CAPSULE BY MOUTH EVERY DAY. 10/26/15  Yes Richard Maceo Pro., MD  predniSONE (STERAPRED UNI-PAK 21 TAB) 10 MG (21) TBPK tablet Take 1 tablet (10 mg total) by mouth daily. 03/12/16  Yes Richard Maceo Pro., MD  promethazine (PHENERGAN) 25 MG tablet Take 1 tablet (25 mg total) by mouth every 4 (four) hours as needed for nausea or vomiting (nausea or cough). 03/12/16  Yes Richard Maceo Pro., MD    Patient Active Problem List   Diagnosis Date Noted  . Persistent atrial fibrillation ([place]) 02/05/2016  . B12 deficiency 07/25/2015  . Deficiency of vitamin B 07/25/2015  . Chest pain 07/01/2015  . Multiple myeloma ([place]) 04/12/2015  . Arteriosclerosis of coronary artery 04/09/2015  . CAFL (chronic  airflow limitation) ([place]) 04/09/2015  . Chronic kidney disease requiring chronic dialysis ([place]) 04/09/2015  . Gastro-esophageal reflux disease without esophagitis 04/09/2015  . Gout 04/09/2015  . H/O acute myocardial infarction 04/09/2015  . HLD (hyperlipidemia) 04/09/2015  . BP (high blood pressure) 04/09/2015  . Bad memory 04/09/2015  . 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 Kidney failure 04/09/2015  . End-stage renal disease ([place]) 04/09/2015  . Chronic kidney disease (CKD), stage V ([place]) 07/20/2013  . Chronic kidney disease, stage V ([place]) 07/20/2013  . Absolute anemia 03/31/2013  . Neuropathy ([place]) 03/31/2013    Past Medical History  Diagnosis Date  . Chronic kidney disease   . Cancer ([place])   . Multiple myeloma ([place])   . COPD (chronic obstructive pulmonary disease) ([place])   . Shortness of breath dyspnea   . Neuropathy ([place])   . GERD (gastroesophageal reflux disease)   . Stroke (cerebrum) ([facility])     weakness Lt hand  . Coronary artery disease   . Myocardial infarction [facility]) 2005    Social History   Social History  . Marital Status: Married    Spouse Name: N/A  . Number of Children: N/A  . Years of Education: N/A   Occupational History  . Not on file.   Social History Main Topics  . Smoking status: Current Every Day Smoker -- 0.25 packs/day  Types: Cigarettes  . Smokeless tobacco: Not on file     Comment: smokes about 3 cigarettes per week  . Alcohol Use: No  . Drug Use: No  . Sexual Activity: Not on file   Other Topics Concern  . Not on file   Social History Narrative    No Known Allergies  Review of Systems  Constitutional: Negative.   Respiratory: Negative.   Cardiovascular: Negative.   Skin: Negative.   Neurological: Negative.   Psychiatric/Behavioral: Positive for memory loss.    Immunization History  Administered Date(s) Administered  . Influenza, High Dose Seasonal PF 08/03/2015   Objective:  BP  118/64 mmHg  Pulse 86  Temp(Src) 98.4 F (36.9 C)  Resp 14  Wt 194 lb (87.998 kg)  Physical Exam  Constitutional: He is oriented to person, place, and time and well-developed, well-nourished, and in no distress.  HENT:  Head: Normocephalic and atraumatic.  Right Ear: External ear normal.  Left Ear: External ear normal.  Nose: Nose normal.  Eyes: Conjunctivae are normal.  Neck: Neck supple.  Cardiovascular: Normal rate, regular rhythm and normal heart sounds.   Pulmonary/Chest: Effort normal and breath sounds normal.  Abdominal: Soft.  Neurological: He is alert and oriented to person, place, and time. Gait normal.  Wide based  gait.  Skin: Skin is warm and dry.  Psychiatric: Mood, affect and judgment normal.    Lab Results  Component Value Date   WBC 5.8 01/02/2016   HGB 11.7* 01/02/2016   HCT 35.1* 01/02/2016   PLT 136* 01/02/2016   GLUCOSE 93 01/02/2016   CHOL 163 08/30/2015   TRIG 78 08/30/2015   HDL 43 08/30/2015   LDLCALC 104* 08/30/2015   TSH 1.080 08/30/2015   INR 1.0 12/11/2013   HGBA1C 4.7* 08/30/2015    CMP     Component Value Date/Time   NA 135 01/02/2016 1023   NA 140 09/29/2014 0848   K 3.9 01/02/2016 1023   K 3.8 09/29/2014 0848   CL 94* 01/02/2016 1023   CL 98 09/29/2014 0848   CO2 31 01/02/2016 1023   CO2 35* 09/29/2014 0848   GLUCOSE 93 01/02/2016 1023   GLUCOSE 117* 09/29/2014 0848   BUN 35* 01/02/2016 1023   BUN 23* 09/29/2014 0848   CREATININE 4.78* 01/02/2016 1023   CREATININE 4.49* 09/29/2014 0848   CALCIUM 9.0 01/02/2016 1023   CALCIUM 9.2 11/22/2014 1042   PROT 8.0 01/02/2016 1023   PROT 7.4 09/29/2014 0848   ALBUMIN 4.2 01/02/2016 1023   ALBUMIN 3.3* 09/29/2014 0848   AST 15 01/02/2016 1023   AST 16 09/29/2014 0848   ALT 8* 01/02/2016 1023   ALT 14 09/29/2014 0848   ALKPHOS 65 01/02/2016 1023   ALKPHOS 91 09/29/2014 0848   BILITOT 1.1 01/02/2016 1023   BILITOT 0.6 09/29/2014 0848   GFRNONAA 10* 01/02/2016 1023   GFRNONAA  14* 09/29/2014 0848   GFRNONAA 19* 02/08/2014 2042   GFRAA 12* 01/02/2016 1023   GFRAA 16* 09/29/2014 0848   GFRAA 22* 02/08/2014 2042    Assessment and Plan :  1. B12 deficiency B12 injection administered today. - cyanocobalamin ((VITAMIN B-12)) injection 1,000 mcg; Inject 1 mL (1,000 mcg total) into the muscle once.  2. Memory change/Alzheimer's disease Stable. Patient is aware that this a problem.  3. Falls, subsequent encounter Discussed preventions for the falls, also advised patient and his wife that throw rugs and cords that could be the cause of patient falling to be removed.  4. Scab On right hand near thumb area, healing well so far. There may be 1 suture still present but hard to tell today due to the scab, will follow. I have done the exam and reviewed the above chart and it is accurate to the best of my knowledge.  Patient was seen and examined by Dr. Eulas Post and note was scribed by Theressa Millard, RMA.    Miguel Aschoff MD [facility] 03/14/2016 11:03 AM

## 2016-03-15 DIAGNOSIS — N186 End stage renal disease: Secondary | ICD-10-CM | POA: Diagnosis not present

## 2016-03-15 DIAGNOSIS — D689 Coagulation defect, unspecified: Secondary | ICD-10-CM | POA: Diagnosis not present

## 2016-03-15 DIAGNOSIS — D509 Iron deficiency anemia, unspecified: Secondary | ICD-10-CM | POA: Diagnosis not present

## 2016-03-18 DIAGNOSIS — D689 Coagulation defect, unspecified: Secondary | ICD-10-CM | POA: Diagnosis not present

## 2016-03-18 DIAGNOSIS — N186 End stage renal disease: Secondary | ICD-10-CM | POA: Diagnosis not present

## 2016-03-18 DIAGNOSIS — D509 Iron deficiency anemia, unspecified: Secondary | ICD-10-CM | POA: Diagnosis not present

## 2016-03-22 DIAGNOSIS — D509 Iron deficiency anemia, unspecified: Secondary | ICD-10-CM | POA: Diagnosis not present

## 2016-03-22 DIAGNOSIS — D689 Coagulation defect, unspecified: Secondary | ICD-10-CM | POA: Diagnosis not present

## 2016-03-22 DIAGNOSIS — N186 End stage renal disease: Secondary | ICD-10-CM | POA: Diagnosis not present

## 2016-03-25 DIAGNOSIS — D689 Coagulation defect, unspecified: Secondary | ICD-10-CM | POA: Diagnosis not present

## 2016-03-25 DIAGNOSIS — N186 End stage renal disease: Secondary | ICD-10-CM | POA: Diagnosis not present

## 2016-03-25 DIAGNOSIS — D509 Iron deficiency anemia, unspecified: Secondary | ICD-10-CM | POA: Diagnosis not present

## 2016-03-27 ENCOUNTER — Other Ambulatory Visit: Payer: Self-pay | Admitting: Family Medicine

## 2016-03-27 ENCOUNTER — Telehealth: Payer: Self-pay | Admitting: Family Medicine

## 2016-03-27 DIAGNOSIS — D689 Coagulation defect, unspecified: Secondary | ICD-10-CM | POA: Diagnosis not present

## 2016-03-27 DIAGNOSIS — N186 End stage renal disease: Secondary | ICD-10-CM | POA: Diagnosis not present

## 2016-03-27 DIAGNOSIS — D509 Iron deficiency anemia, unspecified: Secondary | ICD-10-CM | POA: Diagnosis not present

## 2016-03-27 MED ORDER — NAPROXEN 500 MG PO TABS: 500.0000 mg | ORAL_TABLET | Freq: Two times a day (BID) | ORAL | Status: DC

## 2016-03-27 NOTE — Telephone Encounter (Signed)
Frank Huang wife [name]

## 2016-03-27 NOTE — Telephone Encounter (Signed)
This is a pt's of Dr. Alben Spittle. Pt's wife [name] stated that pt is having the same knee pain he was having when he had an OV on 10/20/15. [name] stated that it is warm, red, and painful to the touch. [name] stated pt has a history of gout. [name] wanted to see if something for gout could be called into [facility]. Pt took naproxen (NAPROSYN) 500 MG tablet when he was in 10/20/15 and [name] stated it did help. I did advise that Dr. Rosanna Randy is out of the office until 04/09/16. Please advise. Thanks TNP

## 2016-03-27 NOTE — Telephone Encounter (Signed)
Can send rf for naprosyn 500mg  one twice a day as needed for knee pain, #14, no refills Go to ER or urgent care if it does not rapidly improve with naprosyn, if it gets worse, or if any fever.

## 2016-04-01 ENCOUNTER — Ambulatory Visit (INDEPENDENT_AMBULATORY_CARE_PROVIDER_SITE_OTHER): Payer: Medicare Other | Admitting: Physician Assistant

## 2016-04-01 ENCOUNTER — Encounter: Payer: Self-pay | Admitting: Physician Assistant

## 2016-04-01 ENCOUNTER — Telehealth: Payer: Self-pay | Admitting: *Deleted

## 2016-04-01 DIAGNOSIS — M7052 Other bursitis of knee, left knee: Secondary | ICD-10-CM | POA: Diagnosis not present

## 2016-04-01 DIAGNOSIS — D509 Iron deficiency anemia, unspecified: Secondary | ICD-10-CM | POA: Diagnosis not present

## 2016-04-01 DIAGNOSIS — N186 End stage renal disease: Secondary | ICD-10-CM | POA: Diagnosis not present

## 2016-04-01 DIAGNOSIS — D689 Coagulation defect, unspecified: Secondary | ICD-10-CM | POA: Diagnosis not present

## 2016-04-01 MED ORDER — PREDNISONE 10 MG (21) PO TBPK: ORAL_TABLET | ORAL | Status: DC

## 2016-04-01 NOTE — Telephone Encounter (Addendum)
Called to report that he is having a problem with his knee adn was told to call if he has bone pains. Asking if he needs to call PCP or come here.

## 2016-04-01 NOTE — Patient Instructions (Signed)
Prepatellar Bursitis With Rehab  Bursitis is a condition that is characterized by inflammation of a bursa. Saunders Revel exists in many areas of the body. They are fluid-filled sacs that lie between a soft tissue (skin, tendon, or ligament) and a bone, and they reduce friction between the structures as well as the stress placed on the soft tissue. Prepatellar bursitis is inflammation of the bursa that lies between the skin and the kneecap (patella). This condition often causes pain over the patella. SYMPTOMS   Pain, tenderness, and/or inflammation over the patella.  Pain that worsens with movement of the knee joint.  Decreased range of motion for the knee joint.  A crackling sound (crepitation) when the bursa is moved or touched.  Occasionally, painless swelling of the bursa.  Fever (when infected). CAUSES  Bursitis is caused by damage to the bursa, which results in an inflammatory response. Common mechanisms of injury include:  Direct trauma to the front of the knee.  Repetitive and/or stressful use of the knee. RISK INCREASES WITH:  Activities in which kneeling and/or falling on one's knees is likely (volleyball or football).  Repetitive and stressful training, especially if it involves running on hills.  Improper training techniques, such as a sudden increase in the intensity, frequency, or duration of training.  Failure to warm up properly before activity.  Poor technique.  Artificial turf. PREVENTION   Avoid kneeling or falling on your knees.  Warm up and stretch properly before activity.  Allow for adequate recovery between workouts.  Maintain physical fitness:  Strength, flexibility, and endurance.  Cardiovascular fitness.  Learn and use proper technique. When possible, have a coach correct improper technique.  Wear properly fitted and padded protective equipment (knee pads). PROGNOSIS  If treated properly, then the symptoms of prepatellar bursitis usually resolve  within 2 weeks. RELATED COMPLICATIONS   Recurrent symptoms that result in a chronic problem.  Prolonged healing time, if improperly treated or reinjured.  Limited range of motion.  Infection of bursa.  Chronic inflammation or scarring of bursa. TREATMENT  Treatment initially involves the use of ice and medication to help reduce pain and inflammation. The use of strengthening and stretching exercises may help reduce pain with activity, especially those of the quadriceps and hamstring muscles. These exercises may be performed at home or with referral to a therapist. Your caregiver may recommend knee pads when you return to playing sports, in order to reduce the stress on the prepatellar bursa. If symptoms persist despite treatment, then your caregiver may drain fluid out with a needle (aspirate) the bursa. If symptoms persist for greater than 6 months despite nonsurgical (conservative) treatment, then surgery may be recommended to remove the bursa.  MEDICATION  If pain medication is necessary, then nonsteroidal anti-inflammatory medications, such as aspirin and ibuprofen, or other minor pain relievers, such as acetaminophen, are often recommended.  Do not take pain medication for 7 days before surgery.  Prescription pain relievers may be given if deemed necessary by your caregiver. Use only as directed and only as much as you need.  Corticosteroid injections may be given by your caregiver. These injections should be reserved for the most serious cases, because they may only be given a certain number of times. HEAT AND COLD  Cold treatment (icing) relieves pain and reduces inflammation. Cold treatment should be applied for 10 to 15 minutes every 2 to 3 hours for inflammation and pain and immediately after any activity that aggravates your symptoms. Use ice packs or massage the  area with a piece of ice (ice massage). °· Heat treatment may be used prior to performing the stretching and  strengthening activities prescribed by your caregiver, physical therapist, or athletic trainer. Use a heat pack or soak the injury in warm water. °SEEK MEDICAL CARE IF: °· Treatment seems to offer no benefit, or the condition worsens. °· Any medications produce adverse side effects. °EXERCISES °RANGE OF MOTION (ROM) AND STRETCHING EXERCISES - Prepatellar Bursitis °These exercises may help you when beginning to rehabilitate your injury. Your symptoms may resolve with or without further involvement from your physician, physical therapist or athletic trainer. While completing these exercises, remember:  °· Restoring tissue flexibility helps normal motion to return to the joints. This allows healthier, less painful movement and activity. °· An effective stretch should be held for at least 30 seconds. °· A stretch should never be painful. You should only feel a gentle lengthening or release in the stretched tissue. °STRETCH - Hamstrings, Standing °· Stand or sit and extend your right / left leg, placing your foot on a chair or foot stool °· Keeping a slight arch in your low back and your hips straight forward. °· Lead with your chest and lean forward at the waist until you feel a gentle stretch in the back of your right / left knee or thigh. (When done correctly, this exercise requires leaning only a small distance.) °· Hold this position for __________ seconds. °Repeat __________ times. Complete this stretch __________ times per day. °STRETCH - Quadriceps, Prone  °· Lie on your stomach on a firm surface, such as a bed or padded floor. °· Bend your right / left knee and grasp your ankle. If you are unable to reach, your ankle or pant leg, use a belt around your foot to lengthen your reach. °· Gently pull your heel toward your buttocks. Your knee should not slide out to the side. You should feel a stretch in the front of your thigh and/or knee. °· Hold this position for __________ seconds. °Repeat __________ times.  Complete this stretch __________ times per day.  °STRETCH - Hamstrings/Adductors, V-Sit  °· Sit on the floor with your legs extended in a large "V," keeping your knees straight. °· With your head and chest upright, bend at your waist reaching for your right foot to stretch your left adductors. °· You should feel a stretch in your left inner thigh. Hold for __________ seconds. °· Return to the upright position to relax your leg muscles. °· Continuing to keep your chest upright, bend straight forward at your waist to stretch your hamstrings. °· You should feel a stretch behind both of your thighs and/or knees. Hold for __________ seconds. °· Return to the upright position to relax your leg muscles. °· Repeat steps 2 through 4. °Repeat __________ times. Complete this exercise __________ times per day.  °STRENGTHENING EXERCISES - Prepatellar Bursitis °· These exercises may help you when beginning to rehabilitate your injury. They may resolve your symptoms with or without further involvement from your physician, physical therapist or athletic trainer. While completing these exercises, remember: °· Muscles can gain both the endurance and the strength needed for everyday activities through controlled exercises. °· Complete these exercises as instructed by your physician, physical therapist or athletic trainer. Progress the resistance and repetitions only as guided. °STRENGTH - Quadriceps, Isometrics °· Lie on your back with your right / left leg extended and your opposite knee bent. °· Gradually tense the muscles in the front of your right /   left thigh. You should see either your kneecap slide up toward your hip or increased dimpling just above the knee. This motion will push the back of the knee down toward the floor/mat/bed on which you are lying.  Hold the muscle as tight as you can without increasing your pain for __________ seconds.  Relax the muscles slowly and completely in between each repetition. Repeat  __________ times. Complete this exercise __________ times per day.  STRENGTH - Quadriceps, Short Arcs   Lie on your back. Place a __________ inch towel roll under your knee so that the knee slightly bends.  Raise only your lower leg by tightening the muscles in the front of your thigh. Do not allow your thigh to rise.  Hold this position for __________ seconds. Repeat __________ times. Complete this exercise __________ times per day.  OPTIONAL ANKLE WEIGHTS: Begin with ____________________, but DO NOT exceed ____________________. Increase in1 lb/0.5 kg increments.  STRENGTH - Quadriceps, Straight Leg Raises  Quality counts! Watch for signs that the quadriceps muscle is working to insure you are strengthening the correct muscles and not "cheating" by substituting with healthier muscles.  Lay on your back with your right / left leg extended and your opposite knee bent.  Tense the muscles in the front of your right / left thigh. You should see either your kneecap slide up or increased dimpling just above the knee. Your thigh may even quiver.  Tighten these muscles even more and raise your leg 4 to 6 inches off the floor. Hold for __________ seconds.  Keeping these muscles tense, lower your leg.  Relax the muscles slowly and completely in between each repetition. Repeat __________ times. Complete this exercise __________ times per day.  STRENGTH - Quadriceps, Step-Ups   Use a thick book, step or step stool that is __________ inches tall.  Holding a wall or counter for balance only, not support.  Slowly step-up with your right / left foot, keeping your knee in line with your hip and foot. Do not allow your knee to bend so far that you cannot see your toes.  Slowly unlock your knee and lower yourself to the starting position. Your muscles, not gravity, should lower you. Repeat __________ times. Complete this exercise __________ times per day.   This information is not intended to replace  advice given to you by your health care provider. Make sure you discuss any questions you have with your health care provider.   Document Released: 10/28/2005 Document Revised: 07/19/2015 Document Reviewed: 02/09/2009 Elsevier Interactive Patient Education [employer].

## 2016-04-01 NOTE — Telephone Encounter (Signed)
Per Dr Mike Gip, pt is to contact PCP. Mrs Mccart informed and stated she just wanted to check

## 2016-04-01 NOTE — Progress Notes (Signed)
Patient: Frank Huang Male    DOB: 07-11-1932   82 y.o.   MRN: [id number] Visit Date: 04/01/2016  Today's Provider: Mar Daring, PA-C   Chief Complaint  Patient presents with  . Knee Pain   Subjective:    Knee Pain  The incident occurred more than 1 week ago. There was no injury mechanism. The pain is present in the left knee. The quality of the pain is described as aching. The pain is at a severity of 10/10. The pain is severe. The pain has been constant since onset. Associated symptoms comments: Just aching at the knee cap. Started with soreness. Now the back of his knee hurts also and is a little swollen. Is gets hard to walk patient reports.. He reports no foreign bodies present. The symptoms are aggravated by weight bearing and movement. Treatments tried: Naprosyn.   Similar to "attack" he had in 10/2015. Naproxen had worked well previously but has not worked currently and he is on day 5. Xray and uric acid level in Dec 2016 were all normal. Believed to be suprapatellar bursitis.    No Known Allergies Previous Medications   ASPIRIN EC 81 MG TABLET    Take 81 mg by mouth daily.   CYANOCOBALAMIN (VITAMIN B 12 PO)    Take by mouth.   DONEPEZIL (ARICEPT) 10 MG TABLET    Take 1 tablet (10 mg total) by mouth at bedtime.   DOXYCYCLINE (VIBRA-TABS) 100 MG TABLET    Take 1 tablet (100 mg total) by mouth 2 (two) times daily.   LOVASTATIN (MEVACOR) 40 MG TABLET    TAKE 1 TABLET BY MOUTH EVERY DAY FOR HIGH CHOLESTEROL   MULTIPLE VITAMIN (MULTI-VITAMINS) TABS    Take by mouth. Reported on 04/01/2016   NAPROXEN (NAPROSYN) 500 MG TABLET    Take 1 tablet (500 mg total) by mouth 2 (two) times daily with a meal.   OMEPRAZOLE (PRILOSEC) 20 MG CAPSULE    Take 1 capsule (20 mg total) by mouth daily. TAKE 1 CAPSULE BY MOUTH EVERY DAY.   PREDNISONE (STERAPRED UNI-PAK 21 TAB) 10 MG (21) TBPK TABLET    Take 1 tablet (10 mg total) by mouth daily.   PROMETHAZINE (PHENERGAN) 25 MG TABLET    Take 1  tablet (25 mg total) by mouth every 4 (four) hours as needed for nausea or vomiting (nausea or cough).    Review of Systems  Constitutional: Negative.   Respiratory: Negative.   Cardiovascular: Negative for chest pain, palpitations and leg swelling.  Gastrointestinal: Negative.   Musculoskeletal: Positive for joint swelling (left knee) and arthralgias (left knee).    Social History  Substance Use Topics  . Smoking status: Current Every Day Smoker -- 0.25 packs/day    Types: Cigarettes  . Smokeless tobacco: Not on file     Comment: smokes about 3 cigarettes per week  . Alcohol Use: No   Objective:   BP 130/60 mmHg  Pulse 80  Temp(Src) 97.2 F (36.2 C) (Oral)  Resp 16  Wt 192 lb 3.2 oz (87.181 kg)  Physical Exam  Constitutional: He appears well-developed and well-nourished. No distress.  HENT:  Head: Normocephalic and atraumatic.  Neck: Normal range of motion. Neck supple.  Cardiovascular: Normal rate, regular rhythm and normal heart sounds.  Exam reveals no gallop and no friction rub.   No murmur heard. Pulmonary/Chest: Effort normal and breath sounds normal. No respiratory distress. He has no wheezes. He has no  rales.  Musculoskeletal:       Right knee: Normal.       Left knee: He exhibits swelling and erythema. He exhibits normal range of motion (just has to move slower through motions), no effusion, normal alignment, no LCL laxity, normal patellar mobility, no bony tenderness, normal meniscus and no MCL laxity. Tenderness (over patella) found.  Skin: He is not diaphoretic.  Vitals reviewed.       Assessment & Plan:     1. Suprapatellar bursitis of left knee Being that he has not responded to naproxen I will treat him with an oral prednisone taper as below. He is to discontinue naproxen. We did discuss a cortisone injection vs oral and he and his wife prefer to try oral treatment first. If still no relief may consider steroid injection. He is to call if no  improvement. - predniSONE (STERAPRED UNI-PAK 21 TAB) 10 MG (21) TBPK tablet; Take as directed on package directions.  Dispense: 21 tablet; Refill: 0       Mar Daring, PA-C  [facility]

## 2016-04-05 DIAGNOSIS — D509 Iron deficiency anemia, unspecified: Secondary | ICD-10-CM | POA: Diagnosis not present

## 2016-04-05 DIAGNOSIS — D689 Coagulation defect, unspecified: Secondary | ICD-10-CM | POA: Diagnosis not present

## 2016-04-05 DIAGNOSIS — N186 End stage renal disease: Secondary | ICD-10-CM | POA: Diagnosis not present

## 2016-04-08 DIAGNOSIS — N186 End stage renal disease: Secondary | ICD-10-CM | POA: Diagnosis not present

## 2016-04-08 DIAGNOSIS — D509 Iron deficiency anemia, unspecified: Secondary | ICD-10-CM | POA: Diagnosis not present

## 2016-04-08 DIAGNOSIS — D689 Coagulation defect, unspecified: Secondary | ICD-10-CM | POA: Diagnosis not present

## 2016-04-10 ENCOUNTER — Ambulatory Visit (INDEPENDENT_AMBULATORY_CARE_PROVIDER_SITE_OTHER): Payer: Medicare Other

## 2016-04-10 DIAGNOSIS — Z992 Dependence on renal dialysis: Secondary | ICD-10-CM | POA: Diagnosis not present

## 2016-04-10 DIAGNOSIS — E538 Deficiency of other specified B group vitamins: Secondary | ICD-10-CM

## 2016-04-10 DIAGNOSIS — I120 Hypertensive chronic kidney disease with stage 5 chronic kidney disease or end stage renal disease: Secondary | ICD-10-CM | POA: Diagnosis not present

## 2016-04-10 DIAGNOSIS — D689 Coagulation defect, unspecified: Secondary | ICD-10-CM | POA: Diagnosis not present

## 2016-04-10 DIAGNOSIS — D509 Iron deficiency anemia, unspecified: Secondary | ICD-10-CM | POA: Diagnosis not present

## 2016-04-10 DIAGNOSIS — N186 End stage renal disease: Secondary | ICD-10-CM | POA: Diagnosis not present

## 2016-04-10 MED ORDER — CYANOCOBALAMIN 1000 MCG/ML IJ SOLN
1000.0000 ug | Freq: Once | INTRAMUSCULAR | Status: AC
Start: 2016-04-10 — End: 2016-04-10
  Administered 2016-04-10: 1000 ug via INTRAMUSCULAR

## 2016-04-11 ENCOUNTER — Ambulatory Visit
Admission: RE | Admit: 2016-04-11 | Discharge: 2016-04-11 | Disposition: A | Discharge status: Home / Self Care | Payer: Medicare Other | Source: Ambulatory Visit | Attending: Hematology and Oncology | Admitting: Hematology and Oncology

## 2016-04-11 ENCOUNTER — Ambulatory Visit
Admission: RE | Admit: 2016-04-11 | Discharge: 2016-04-11 | Disposition: A | Discharge status: Home / Self Care | Payer: Medicare Other | Source: Ambulatory Visit | Attending: Family Medicine | Admitting: Family Medicine

## 2016-04-11 ENCOUNTER — Ambulatory Visit (INDEPENDENT_AMBULATORY_CARE_PROVIDER_SITE_OTHER): Payer: Medicare Other | Admitting: Family Medicine

## 2016-04-11 VITALS — BP 132/50 | HR 84 | Temp 98.4°F | Resp 18 | Wt 195.0 lb

## 2016-04-11 DIAGNOSIS — M8580 Other specified disorders of bone density and structure, unspecified site: Secondary | ICD-10-CM | POA: Insufficient documentation

## 2016-04-11 DIAGNOSIS — C9000 Multiple myeloma not having achieved remission: Secondary | ICD-10-CM | POA: Diagnosis not present

## 2016-04-11 DIAGNOSIS — I739 Peripheral vascular disease, unspecified: Secondary | ICD-10-CM | POA: Insufficient documentation

## 2016-04-11 DIAGNOSIS — M79606 Pain in leg, unspecified: Secondary | ICD-10-CM

## 2016-04-11 DIAGNOSIS — M25472 Effusion, left ankle: Secondary | ICD-10-CM | POA: Diagnosis not present

## 2016-04-11 DIAGNOSIS — M25471 Effusion, right ankle: Secondary | ICD-10-CM | POA: Diagnosis not present

## 2016-04-11 DIAGNOSIS — M7989 Other specified soft tissue disorders: Secondary | ICD-10-CM | POA: Diagnosis not present

## 2016-04-11 MED ORDER — FUROSEMIDE 20 MG PO TABS: 20.0000 mg | ORAL_TABLET | Freq: Every day | ORAL | Status: DC

## 2016-04-11 NOTE — Progress Notes (Signed)
Patient ID: Frank Huang, male   DOB: 1932-10-16, 82 y.o.   MRN: [id number]   Frank Huang  MRN: [id number] DOB: [birth year]  Subjective:  HPI   The patient is an 82 year old male who presents today for evaluation of pedal edema.  The patient states that he has had swelling in both ankles since yesterday morning.  He states that now it has moved up into his legs.  He denies any shortness of breath or unusual fatigue.  However his wife thinks he may be more short of breath.  He denies any changes in his diet or activity.  He notes that the left side is worse than the right.  He also complains that he has tenderness just below the calf muscle of both legs.  Patient Active Problem List   Diagnosis Date Noted  . Persistent atrial fibrillation ([place]) 02/05/2016  . B12 deficiency 07/25/2015  . Deficiency of vitamin B 07/25/2015  . Chest pain 07/01/2015  . Multiple myeloma ([place]) 04/12/2015  . Arteriosclerosis of coronary artery 04/09/2015  . CAFL (chronic airflow limitation) ([place]) 04/09/2015  . Chronic kidney disease requiring chronic dialysis ([place]) 04/09/2015  . Gastro-esophageal reflux disease without esophagitis 04/09/2015  . Gout 04/09/2015  . H/O acute myocardial infarction 04/09/2015  . HLD (hyperlipidemia) 04/09/2015  . BP (high blood pressure) 04/09/2015  . Bad memory 04/09/2015  . 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 Kidney failure 04/09/2015  . End-stage renal disease ([place]) 04/09/2015  . Chronic kidney disease (CKD), stage V ([place]) 07/20/2013  . Chronic kidney disease, stage V ([place]) 07/20/2013  . Absolute anemia 03/31/2013  . Neuropathy ([place]) 03/31/2013    Past Medical History  Diagnosis Date  . Chronic kidney disease   . Cancer ([place])   . Multiple myeloma ([place])   . COPD (chronic obstructive pulmonary disease) ([place])   . Shortness of breath dyspnea   . Neuropathy ([place])   . GERD (gastroesophageal reflux disease)   . Stroke (cerebrum) (HCC)     weakness Lt hand  . Coronary artery disease   . Myocardial infarction [facility]) 2005    Social History   Social History  . Marital Status: Married    Spouse Name: N/A  . Number of Children: N/A  . Years of Education: N/A   Occupational History  . Not on file.   Social History Main Topics  . Smoking status: Current Every Day Smoker -- 0.25 packs/day    Types: Cigarettes  . Smokeless tobacco: Not on file     Comment: smokes about 3 cigarettes per week  . Alcohol Use: No  . Drug Use: No  . Sexual Activity: Not on file   Other Topics Concern  . Not on file   Social History Narrative    Outpatient Prescriptions Prior to Visit  Medication Sig Dispense Refill  . aspirin EC 81 MG tablet Take 81 mg by mouth daily.    . Cyanocobalamin (VITAMIN B 12 PO) Take by mouth.    . donepezil (ARICEPT) 10 MG tablet Take 1 tablet (10 mg total) by mouth at bedtime. 90 tablet 3  . lovastatin (MEVACOR) 40 MG tablet TAKE 1 TABLET BY MOUTH EVERY DAY FOR HIGH CHOLESTEROL    . Multiple Vitamin (MULTI-VITAMINS) TABS Take by mouth. Reported on 04/01/2016    . naproxen (NAPROSYN) 500 MG tablet Take 1 tablet (500 mg total) by mouth 2 (two) times daily with a meal. 14 tablet 0  .  omeprazole (PRILOSEC) 20 MG capsule Take 1 capsule (20 mg total) by mouth daily. TAKE 1 CAPSULE BY MOUTH EVERY DAY. 90 capsule 3  . promethazine (PHENERGAN) 25 MG tablet Take 1 tablet (25 mg total) by mouth every 4 (four) hours as needed for nausea or vomiting (nausea or cough). 45 tablet 0  . doxycycline (VIBRA-TABS) 100 MG tablet Take 1 tablet (100 mg total) by mouth 2 (two) times daily. (Patient not taking: Reported on 04/01/2016) 20 tablet 0  . predniSONE (STERAPRED UNI-PAK 21 TAB) 10 MG (21) TBPK tablet Take as directed on package directions. 21 tablet 0   No facility-administered medications prior to visit.    No Known Allergies  Review of Systems  Constitutional: Negative for fever and chills. Malaise/fatigue: chronic  unchanged.  Respiratory: Negative for cough, hemoptysis, sputum production, shortness of breath and wheezing.   Cardiovascular: Positive for leg swelling. Negative for chest pain, palpitations, orthopnea, claudication and PND.  Neurological: Negative for weakness.   Objective:  BP 132/50 mmHg  Pulse 84  Temp(Src) 98.4 F (36.9 C) (Oral)  Resp 18  Wt 195 lb (88.451 kg)  SpO2 96%  Physical Exam  Constitutional: He is well-developed, well-nourished, and in no distress.  HENT:  Head: Normocephalic and atraumatic.  Right Ear: External ear normal.  Left Ear: External ear normal.  Eyes: Conjunctivae are normal.  Neck: Neck supple.  Cardiovascular: Normal rate, regular rhythm and normal heart sounds.   Pulmonary/Chest: Effort normal and breath sounds normal.  Abdominal: Soft.  Musculoskeletal: He exhibits edema (2+ on the left and 1+ on the right).  Left 14.75in Right 14.75 in Cord on left, negative [place]  Neurological: He is alert.  Psychiatric: Mood and affect normal.    Assessment and Plan :  1. Swelling of both ankles  - CBC with Differential/Platelet - Renal function panel - B Nat Peptide - Ultrasound doppler venous legs bilat; Future - furosemide (LASIX) 20 MG tablet; Take 1 tablet (20 mg total) by mouth daily.  Dispense: 30 tablet; Refill: 3 - US Venous Img Lower Bilateral; Future RTC 3-5 days. 2. Pain of lower extremity, unspecified laterality  - CBC with Differential/Platelet - Renal function panel - B Nat Peptide - Ultrasound doppler venous legs bilat; Future - US Venous Img Lower Bilateral; Future 3.CKD--on hemodialysis   Miguel Aschoff MD [facility] 04/11/2016 2:23 PM

## 2016-04-12 DIAGNOSIS — D631 Anemia in chronic kidney disease: Secondary | ICD-10-CM | POA: Diagnosis not present

## 2016-04-12 DIAGNOSIS — D689 Coagulation defect, unspecified: Secondary | ICD-10-CM | POA: Diagnosis not present

## 2016-04-12 DIAGNOSIS — N186 End stage renal disease: Secondary | ICD-10-CM | POA: Diagnosis not present

## 2016-04-12 DIAGNOSIS — D509 Iron deficiency anemia, unspecified: Secondary | ICD-10-CM | POA: Diagnosis not present

## 2016-04-12 LAB — RENAL FUNCTION PANEL
ALBUMIN: 3.9 g/dL (ref 3.5–4.7)
BUN/Creatinine Ratio: 9 — ABNORMAL LOW (ref 10–24)
BUN: 33 mg/dL — AB (ref 8–27)
CO2: 27 mmol/L (ref 18–29)
CREATININE: 3.83 mg/dL — AB (ref 0.76–1.27)
Calcium: 9.3 mg/dL (ref 8.6–10.2)
Chloride: 95 mmol/L — ABNORMAL LOW (ref 96–106)
GFR, EST AFRICAN AMERICAN: 16 mL/min/{1.73_m2} — AB (ref >59)
GFR, EST NON AFRICAN AMERICAN: 14 mL/min/{1.73_m2} — AB (ref >59)
GLUCOSE: 91 mg/dL (ref 65–99)
POTASSIUM: 4.5 mmol/L (ref 3.5–5.2)
Phosphorus: 3.8 mg/dL (ref 2.5–4.5)
Sodium: 140 mmol/L (ref 134–144)

## 2016-04-12 LAB — CBC WITH DIFFERENTIAL/PLATELET
BASOS ABS: 0 10*3/uL (ref 0.0–0.2)
BASOS: 0 %
EOS (ABSOLUTE): 0.1 10*3/uL (ref 0.0–0.4)
Eos: 2 %
HEMOGLOBIN: 10.1 g/dL — AB (ref 12.6–17.7)
Hematocrit: 31.5 % — ABNORMAL LOW (ref 37.5–51.0)
Immature Grans (Abs): 0 10*3/uL (ref 0.0–0.1)
Immature Granulocytes: 1 %
LYMPHS ABS: 1.2 10*3/uL (ref 0.7–3.1)
Lymphs: 21 %
MCH: 30.6 pg (ref 26.6–33.0)
MCHC: 32.1 g/dL (ref 31.5–35.7)
MCV: 96 fL (ref 79–97)
MONOCYTES: 5 %
Monocytes Absolute: 0.3 10*3/uL (ref 0.1–0.9)
NEUTROS ABS: 4.2 10*3/uL (ref 1.4–7.0)
Neutrophils: 71 %
Platelets: 150 10*3/uL (ref 150–379)
RBC: 3.3 x10E6/uL — ABNORMAL LOW (ref 4.14–5.80)
RDW: 14.8 % (ref 12.3–15.4)
WBC: 6 10*3/uL (ref 3.4–10.8)

## 2016-04-12 LAB — BRAIN NATRIURETIC PEPTIDE: BNP: 763.7 pg/mL — ABNORMAL HIGH (ref 0.0–100.0)

## 2016-04-13 ENCOUNTER — Ambulatory Visit: Payer: Medicare Other | Admitting: Family Medicine

## 2016-04-15 DIAGNOSIS — D689 Coagulation defect, unspecified: Secondary | ICD-10-CM | POA: Diagnosis not present

## 2016-04-15 DIAGNOSIS — D631 Anemia in chronic kidney disease: Secondary | ICD-10-CM | POA: Diagnosis not present

## 2016-04-15 DIAGNOSIS — N186 End stage renal disease: Secondary | ICD-10-CM | POA: Diagnosis not present

## 2016-04-15 DIAGNOSIS — D509 Iron deficiency anemia, unspecified: Secondary | ICD-10-CM | POA: Diagnosis not present

## 2016-04-16 ENCOUNTER — Other Ambulatory Visit: Payer: Medicare Other

## 2016-04-16 ENCOUNTER — Inpatient Hospital Stay: Payer: Medicare Other | Attending: Oncology

## 2016-04-16 DIAGNOSIS — N281 Cyst of kidney, acquired: Secondary | ICD-10-CM | POA: Insufficient documentation

## 2016-04-16 DIAGNOSIS — D472 Monoclonal gammopathy: Secondary | ICD-10-CM | POA: Diagnosis not present

## 2016-04-16 DIAGNOSIS — Z888 Allergy status to other drugs, medicaments and biological substances status: Secondary | ICD-10-CM | POA: Insufficient documentation

## 2016-04-16 DIAGNOSIS — Z79899 Other long term (current) drug therapy: Secondary | ICD-10-CM | POA: Diagnosis not present

## 2016-04-16 DIAGNOSIS — M19012 Primary osteoarthritis, left shoulder: Secondary | ICD-10-CM | POA: Insufficient documentation

## 2016-04-16 DIAGNOSIS — E538 Deficiency of other specified B group vitamins: Secondary | ICD-10-CM | POA: Insufficient documentation

## 2016-04-16 DIAGNOSIS — Z8673 Personal history of transient ischemic attack (TIA), and cerebral infarction without residual deficits: Secondary | ICD-10-CM | POA: Insufficient documentation

## 2016-04-16 DIAGNOSIS — I252 Old myocardial infarction: Secondary | ICD-10-CM | POA: Diagnosis not present

## 2016-04-16 DIAGNOSIS — R600 Localized edema: Secondary | ICD-10-CM | POA: Insufficient documentation

## 2016-04-16 DIAGNOSIS — Z800 Family history of malignant neoplasm of digestive organs: Secondary | ICD-10-CM | POA: Insufficient documentation

## 2016-04-16 DIAGNOSIS — J449 Chronic obstructive pulmonary disease, unspecified: Secondary | ICD-10-CM | POA: Insufficient documentation

## 2016-04-16 DIAGNOSIS — N200 Calculus of kidney: Secondary | ICD-10-CM | POA: Diagnosis not present

## 2016-04-16 DIAGNOSIS — C9000 Multiple myeloma not having achieved remission: Secondary | ICD-10-CM

## 2016-04-16 DIAGNOSIS — R948 Abnormal results of function studies of other organs and systems: Secondary | ICD-10-CM | POA: Insufficient documentation

## 2016-04-16 DIAGNOSIS — K219 Gastro-esophageal reflux disease without esophagitis: Secondary | ICD-10-CM | POA: Insufficient documentation

## 2016-04-16 DIAGNOSIS — D649 Anemia, unspecified: Secondary | ICD-10-CM | POA: Insufficient documentation

## 2016-04-16 DIAGNOSIS — Z992 Dependence on renal dialysis: Secondary | ICD-10-CM | POA: Diagnosis not present

## 2016-04-16 DIAGNOSIS — Z9221 Personal history of antineoplastic chemotherapy: Secondary | ICD-10-CM | POA: Insufficient documentation

## 2016-04-16 DIAGNOSIS — R918 Other nonspecific abnormal finding of lung field: Secondary | ICD-10-CM | POA: Insufficient documentation

## 2016-04-16 DIAGNOSIS — Z7982 Long term (current) use of aspirin: Secondary | ICD-10-CM | POA: Diagnosis not present

## 2016-04-16 DIAGNOSIS — R911 Solitary pulmonary nodule: Secondary | ICD-10-CM | POA: Diagnosis not present

## 2016-04-16 DIAGNOSIS — D6959 Other secondary thrombocytopenia: Secondary | ICD-10-CM | POA: Diagnosis not present

## 2016-04-16 DIAGNOSIS — Z9181 History of falling: Secondary | ICD-10-CM | POA: Diagnosis not present

## 2016-04-16 DIAGNOSIS — I2510 Atherosclerotic heart disease of native coronary artery without angina pectoris: Secondary | ICD-10-CM | POA: Insufficient documentation

## 2016-04-16 DIAGNOSIS — N189 Chronic kidney disease, unspecified: Secondary | ICD-10-CM | POA: Insufficient documentation

## 2016-04-16 DIAGNOSIS — F17210 Nicotine dependence, cigarettes, uncomplicated: Secondary | ICD-10-CM | POA: Diagnosis not present

## 2016-04-16 DIAGNOSIS — M1990 Unspecified osteoarthritis, unspecified site: Secondary | ICD-10-CM | POA: Insufficient documentation

## 2016-04-16 LAB — CBC WITH DIFFERENTIAL/PLATELET
Basophils Absolute: 0 10*3/uL (ref 0–0.1)
Basophils Relative: 0 %
Eosinophils Absolute: 0.1 10*3/uL (ref 0–0.7)
Eosinophils Relative: 1 %
HCT: 32.6 % — ABNORMAL LOW (ref 40.0–52.0)
Hemoglobin: 10.5 g/dL — ABNORMAL LOW (ref 13.0–18.0)
Lymphocytes Relative: 10 %
Lymphs Abs: 1 10*3/uL (ref 1.0–3.6)
MCH: 31.4 pg (ref 26.0–34.0)
MCHC: 32.2 g/dL (ref 32.0–36.0)
MCV: 97.5 fL (ref 80.0–100.0)
Monocytes Absolute: 0.4 10*3/uL (ref 0.2–1.0)
Monocytes Relative: 4 %
Neutro Abs: 8.3 10*3/uL — ABNORMAL HIGH (ref 1.4–6.5)
Neutrophils Relative %: 85 %
Platelets: 106 10*3/uL — ABNORMAL LOW (ref 150–440)
RBC: 3.34 MIL/uL — ABNORMAL LOW (ref 4.40–5.90)
RDW: 15.8 % — ABNORMAL HIGH (ref 11.5–14.5)
WBC: 9.7 10*3/uL (ref 3.8–10.6)

## 2016-04-17 DIAGNOSIS — D689 Coagulation defect, unspecified: Secondary | ICD-10-CM | POA: Diagnosis not present

## 2016-04-17 DIAGNOSIS — D509 Iron deficiency anemia, unspecified: Secondary | ICD-10-CM | POA: Diagnosis not present

## 2016-04-17 DIAGNOSIS — D631 Anemia in chronic kidney disease: Secondary | ICD-10-CM | POA: Diagnosis not present

## 2016-04-17 DIAGNOSIS — N186 End stage renal disease: Secondary | ICD-10-CM | POA: Diagnosis not present

## 2016-04-17 LAB — KAPPA/LAMBDA LIGHT CHAINS
Kappa free light chain: 785.6 mg/L — ABNORMAL HIGH (ref 3.3–19.4)
Kappa, lambda light chain ratio: 24.32 — ABNORMAL HIGH (ref 0.26–1.65)
Lambda free light chains: 32.3 mg/L — ABNORMAL HIGH (ref 5.7–26.3)

## 2016-04-18 ENCOUNTER — Ambulatory Visit (INDEPENDENT_AMBULATORY_CARE_PROVIDER_SITE_OTHER): Payer: Medicare Other | Admitting: Family Medicine

## 2016-04-18 ENCOUNTER — Encounter: Payer: Self-pay | Admitting: Family Medicine

## 2016-04-18 VITALS — BP 120/58 | HR 86 | Temp 97.7°F | Resp 16 | Wt 194.0 lb

## 2016-04-18 DIAGNOSIS — N185 Chronic kidney disease, stage 5: Secondary | ICD-10-CM

## 2016-04-18 DIAGNOSIS — C9000 Multiple myeloma not having achieved remission: Secondary | ICD-10-CM | POA: Diagnosis not present

## 2016-04-18 DIAGNOSIS — M25472 Effusion, left ankle: Secondary | ICD-10-CM | POA: Diagnosis not present

## 2016-04-18 DIAGNOSIS — M25471 Effusion, right ankle: Secondary | ICD-10-CM

## 2016-04-18 DIAGNOSIS — M79606 Pain in leg, unspecified: Secondary | ICD-10-CM | POA: Diagnosis not present

## 2016-04-18 DIAGNOSIS — I999 Unspecified disorder of circulatory system: Secondary | ICD-10-CM | POA: Diagnosis not present

## 2016-04-18 LAB — PROTEIN ELECTROPHORESIS, SERUM
A/G Ratio: 1.1 (ref 0.7–1.7)
Albumin ELP: 3.6 g/dL (ref 2.9–4.4)
Alpha-1-Globulin: 0.3 g/dL (ref 0.0–0.4)
Alpha-2-Globulin: 0.8 g/dL (ref 0.4–1.0)
Beta Globulin: 1.9 g/dL — ABNORMAL HIGH (ref 0.7–1.3)
Gamma Globulin: 0.3 g/dL — ABNORMAL LOW (ref 0.4–1.8)
Globulin, Total: 3.4 g/dL (ref 2.2–3.9)
M-Spike, %: 0.8 g/dL — ABNORMAL HIGH
Total Protein ELP: 7 g/dL (ref 6.0–8.5)

## 2016-04-18 NOTE — Progress Notes (Signed)
Patient ID: Frank Huang, male   DOB: [birth year], 82 y.o.   MRN: [id number]    Subjective:  HPI Pt is here for a 1 week follow up of leg swelling. He was started on Lasix 20 mg daily and ordered a doppler (no clot). Pt reports that the swelling is not much better. He has been taking lasix every day.   Prior to Admission medications   Medication Sig Start Date End Date Taking? Authorizing Provider  aspirin EC 81 MG tablet Take 81 mg by mouth daily.   Yes Historical Provider, MD  Cyanocobalamin (VITAMIN B 12 PO) Take by mouth.   Yes Historical Provider, MD  donepezil (ARICEPT) 10 MG tablet Take 1 tablet (10 mg total) by mouth at bedtime. 01/02/16  Yes Richard Maceo Pro., MD  furosemide (LASIX) 20 MG tablet Take 1 tablet (20 mg total) by mouth daily. 04/11/16  Yes Richard Maceo Pro., MD  lovastatin (MEVACOR) 40 MG tablet TAKE 1 TABLET BY MOUTH EVERY DAY FOR HIGH CHOLESTEROL 08/10/14  Yes Historical Provider, MD  Multiple Vitamin (MULTI-VITAMINS) TABS Take by mouth. Reported on 04/01/2016   Yes Historical Provider, MD  naproxen (NAPROSYN) 500 MG tablet Take 1 tablet (500 mg total) by mouth 2 (two) times daily with a meal. 03/27/16  Yes Birdie Sons, MD  omeprazole (PRILOSEC) 20 MG capsule Take 1 capsule (20 mg total) by mouth daily. TAKE 1 CAPSULE BY MOUTH EVERY DAY. 10/26/15  Yes [name]., MD  promethazine (PHENERGAN) 25 MG tablet Take 1 tablet (25 mg total) by mouth every 4 (four) hours as needed for nausea or vomiting (nausea or cough). 03/12/16  Yes Richard Maceo Pro., MD    Patient Active Problem List   Diagnosis Date Noted  . Persistent atrial fibrillation ([place]) 02/05/2016  . B12 deficiency 07/25/2015  . Deficiency of vitamin B 07/25/2015  . Chest pain 07/01/2015  . Multiple myeloma ([place]) 04/12/2015  . Arteriosclerosis of coronary artery 04/09/2015  . CAFL (chronic airflow limitation) ([place]) 04/09/2015  . Chronic kidney disease requiring chronic dialysis ([place]) 04/09/2015    . Gastro-esophageal reflux disease without esophagitis 04/09/2015  . Gout 04/09/2015  . H/O acute myocardial infarction 04/09/2015  . HLD (hyperlipidemia) 04/09/2015  . BP (high blood pressure) 04/09/2015  . Bad memory 04/09/2015  . 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 Kidney failure 04/09/2015  . End-stage renal disease ([place]) 04/09/2015  . Chronic kidney disease (CKD), stage V ([place]) 07/20/2013  . Chronic kidney disease, stage V ([place]) 07/20/2013  . Absolute anemia 03/31/2013  . Neuropathy ([place]) 03/31/2013    Past Medical History  Diagnosis Date  . Chronic kidney disease   . Cancer ([place])   . Multiple myeloma ([place])   . COPD (chronic obstructive pulmonary disease) ([place])   . Shortness of breath dyspnea   . Neuropathy ([place])   . GERD (gastroesophageal reflux disease)   . Stroke (cerebrum) (HCC)     weakness Lt hand  . Coronary artery disease   . Myocardial infarction [facility]) 2005    Social History   Social History  . Marital Status: Married    Spouse Name: N/A  . Number of Children: N/A  . Years of Education: N/A   Occupational History  . Not on file.   Social History Main Topics  . Smoking status: Current Every Day Smoker -- 0.25 packs/day    Types: Cigarettes  . Smokeless tobacco: Not on file  Comment: smokes about 3 cigarettes per week  . Alcohol Use: No  . Drug Use: No  . Sexual Activity: Not on file   Other Topics Concern  . Not on file   Social History Narrative    No Known Allergies  Review of Systems  Constitutional: Negative.   HENT: Negative.   Eyes: Negative.   Respiratory: Negative.   Cardiovascular: Positive for leg swelling.  Gastrointestinal: Negative.   Genitourinary: Negative.   Musculoskeletal: Negative.   Skin: Negative.   Neurological: Negative.   Endo/Heme/Allergies: Negative.   Psychiatric/Behavioral: Negative.     Immunization History  Administered Date(s) Administered  . Influenza,  High Dose Seasonal PF 08/03/2015   Objective:  BP 120/58 mmHg  Pulse 86  Temp(Src) 97.7 F (36.5 C) (Oral)  Resp 16  Wt 194 lb (87.998 kg)  SpO2 96%  Physical Exam  Lab Results  Component Value Date   WBC 9.7 04/16/2016   HGB 10.5* 04/16/2016   HCT 32.6* 04/16/2016   PLT 106* 04/16/2016   GLUCOSE 91 04/11/2016   CHOL 163 08/30/2015   TRIG 78 08/30/2015   HDL 43 08/30/2015   LDLCALC 104* 08/30/2015   TSH 1.080 08/30/2015   INR 1.0 12/11/2013   HGBA1C 4.7* 08/30/2015    CMP     Component Value Date/Time   NA 140 04/11/2016 1537   NA 135 01/02/2016 1023   NA 140 09/29/2014 0848   K 4.5 04/11/2016 1537   K 3.8 09/29/2014 0848   CL 95* 04/11/2016 1537   CL 98 09/29/2014 0848   CO2 27 04/11/2016 1537   CO2 35* 09/29/2014 0848   GLUCOSE 91 04/11/2016 1537   GLUCOSE 93 01/02/2016 1023   GLUCOSE 117* 09/29/2014 0848   BUN 33* 04/11/2016 1537   BUN 35* 01/02/2016 1023   BUN 23* 09/29/2014 0848   CREATININE 3.83* 04/11/2016 1537   CREATININE 4.49* 09/29/2014 0848   CALCIUM 9.3 04/11/2016 1537   CALCIUM 9.2 11/22/2014 1042   PROT 8.0 01/02/2016 1023   PROT 7.4 09/29/2014 0848   ALBUMIN 3.9 04/11/2016 1537   ALBUMIN 4.2 01/02/2016 1023   ALBUMIN 3.3* 09/29/2014 0848   AST 15 01/02/2016 1023   AST 16 09/29/2014 0848   ALT 8* 01/02/2016 1023   ALT 14 09/29/2014 0848   ALKPHOS 65 01/02/2016 1023   ALKPHOS 91 09/29/2014 0848   BILITOT 1.1 01/02/2016 1023   BILITOT 0.6 09/29/2014 0848   GFRNONAA 14* 04/11/2016 1537   GFRNONAA 14* 09/29/2014 0848   GFRNONAA 19* 02/08/2014 2042   GFRAA 16* 04/11/2016 1537   GFRAA 16* 09/29/2014 0848   GFRAA 22* 02/08/2014 2042    Assessment and Plan :  1. Swelling of both ankles/left greater than right The etiology of this may in fact be venous insufficiency on top of his renal failure or could be obstruction of the pelvis. I do fill a mass effect with lymphadenopathy in the left groin so will order CT of the abdomen and pelvis  without contrast. This is discussed with his oncologist. She will see him next week. Stop furosemide. If CT is normal  recommend support hose for this patient. 2. Pain of lower extremity, unspecified laterality   3. Chronic kidney disease (CKD), stage V (HCC)  - CT Abdomen Pelvis Wo Contrast; Future  4. Multiple myeloma, remission status unspecified (HCC)  - CT Abdomen Pelvis Wo Contrast; Future  5. Vascular disease  - CT Abdomen Pelvis Wo Contrast; Future 6. Dementia  Richard  [facility] 04/18/2016 3:28 PM

## 2016-04-19 DIAGNOSIS — D689 Coagulation defect, unspecified: Secondary | ICD-10-CM | POA: Diagnosis not present

## 2016-04-19 DIAGNOSIS — D509 Iron deficiency anemia, unspecified: Secondary | ICD-10-CM | POA: Diagnosis not present

## 2016-04-19 DIAGNOSIS — N186 End stage renal disease: Secondary | ICD-10-CM | POA: Diagnosis not present

## 2016-04-19 DIAGNOSIS — D631 Anemia in chronic kidney disease: Secondary | ICD-10-CM | POA: Diagnosis not present

## 2016-04-22 DIAGNOSIS — N186 End stage renal disease: Secondary | ICD-10-CM | POA: Diagnosis not present

## 2016-04-22 DIAGNOSIS — D509 Iron deficiency anemia, unspecified: Secondary | ICD-10-CM | POA: Diagnosis not present

## 2016-04-22 DIAGNOSIS — D631 Anemia in chronic kidney disease: Secondary | ICD-10-CM | POA: Diagnosis not present

## 2016-04-22 DIAGNOSIS — D689 Coagulation defect, unspecified: Secondary | ICD-10-CM | POA: Diagnosis not present

## 2016-04-23 ENCOUNTER — Inpatient Hospital Stay (HOSPITAL_BASED_OUTPATIENT_CLINIC_OR_DEPARTMENT_OTHER): Payer: Medicare Other | Admitting: Hematology and Oncology

## 2016-04-23 ENCOUNTER — Encounter: Payer: Self-pay | Admitting: Hematology and Oncology

## 2016-04-23 VITALS — BP 106/58 | HR 80 | Temp 95.7°F | Resp 18 | Ht 75.0 in | Wt 193.2 lb

## 2016-04-23 DIAGNOSIS — Z79899 Other long term (current) drug therapy: Secondary | ICD-10-CM

## 2016-04-23 DIAGNOSIS — Z992 Dependence on renal dialysis: Secondary | ICD-10-CM | POA: Diagnosis not present

## 2016-04-23 DIAGNOSIS — D6959 Other secondary thrombocytopenia: Secondary | ICD-10-CM

## 2016-04-23 DIAGNOSIS — Z8673 Personal history of transient ischemic attack (TIA), and cerebral infarction without residual deficits: Secondary | ICD-10-CM

## 2016-04-23 DIAGNOSIS — C9000 Multiple myeloma not having achieved remission: Secondary | ICD-10-CM | POA: Diagnosis not present

## 2016-04-23 DIAGNOSIS — I252 Old myocardial infarction: Secondary | ICD-10-CM | POA: Diagnosis not present

## 2016-04-23 DIAGNOSIS — D472 Monoclonal gammopathy: Secondary | ICD-10-CM | POA: Diagnosis not present

## 2016-04-23 DIAGNOSIS — N189 Chronic kidney disease, unspecified: Secondary | ICD-10-CM

## 2016-04-23 DIAGNOSIS — Z7982 Long term (current) use of aspirin: Secondary | ICD-10-CM | POA: Diagnosis not present

## 2016-04-23 DIAGNOSIS — D649 Anemia, unspecified: Secondary | ICD-10-CM | POA: Diagnosis not present

## 2016-04-23 DIAGNOSIS — K219 Gastro-esophageal reflux disease without esophagitis: Secondary | ICD-10-CM | POA: Diagnosis not present

## 2016-04-23 DIAGNOSIS — Z9181 History of falling: Secondary | ICD-10-CM | POA: Diagnosis not present

## 2016-04-23 DIAGNOSIS — R590 Localized enlarged lymph nodes: Secondary | ICD-10-CM

## 2016-04-23 DIAGNOSIS — J449 Chronic obstructive pulmonary disease, unspecified: Secondary | ICD-10-CM

## 2016-04-23 DIAGNOSIS — R918 Other nonspecific abnormal finding of lung field: Secondary | ICD-10-CM | POA: Diagnosis not present

## 2016-04-23 DIAGNOSIS — N281 Cyst of kidney, acquired: Secondary | ICD-10-CM | POA: Diagnosis not present

## 2016-04-23 DIAGNOSIS — N200 Calculus of kidney: Secondary | ICD-10-CM | POA: Diagnosis not present

## 2016-04-23 DIAGNOSIS — M19012 Primary osteoarthritis, left shoulder: Secondary | ICD-10-CM

## 2016-04-23 DIAGNOSIS — E538 Deficiency of other specified B group vitamins: Secondary | ICD-10-CM | POA: Diagnosis not present

## 2016-04-23 DIAGNOSIS — R948 Abnormal results of function studies of other organs and systems: Secondary | ICD-10-CM | POA: Diagnosis not present

## 2016-04-23 DIAGNOSIS — R911 Solitary pulmonary nodule: Secondary | ICD-10-CM | POA: Diagnosis not present

## 2016-04-23 DIAGNOSIS — I2510 Atherosclerotic heart disease of native coronary artery without angina pectoris: Secondary | ICD-10-CM

## 2016-04-23 DIAGNOSIS — D696 Thrombocytopenia, unspecified: Secondary | ICD-10-CM

## 2016-04-23 DIAGNOSIS — Z9221 Personal history of antineoplastic chemotherapy: Secondary | ICD-10-CM | POA: Diagnosis not present

## 2016-04-23 DIAGNOSIS — F17210 Nicotine dependence, cigarettes, uncomplicated: Secondary | ICD-10-CM

## 2016-04-23 DIAGNOSIS — M1990 Unspecified osteoarthritis, unspecified site: Secondary | ICD-10-CM | POA: Diagnosis not present

## 2016-04-23 DIAGNOSIS — R600 Localized edema: Secondary | ICD-10-CM | POA: Diagnosis not present

## 2016-04-23 NOTE — Progress Notes (Signed)
[facility] day:  04/23/2016   Chief Complaint: Frank Huang is a 82 y.o. male with smoldering multiple myeloma who is seen for 3 month assessment.  HPI:  The patient was last seen in the medical oncology clinic on 01/09/2016.  At that time, he denied any symptoms.  Exam was stable.  M spike was decreasing.  Bone survey on 04/11/2016 revealed that the posterior parietal region lucencies of the skull appeared  more prominent. This could be secondary to myeloma progression.  There were no other definitive lytic lesions.  There was diffuse osteopenia.  Labs on 04/16/2016 revealed a hematocrit of 32.6, hemoglobin 10.5, MCV 97.5, platelets 106,000, WBC 9700 with an [place] of 8300.  SPEP revealed an M spike of 0.8 gm/dL.  Kappa free light chains were 785.6, lambda free light chains 32.3, and ratio 24.32 (0.26 - 1.65).  Bilateral lower extremity duplex on 04/11/2016 revealed no evidence of DVT.  There was a 3.1 x 2.1 x 1.3 cm possible left groin node.  He is scheduled for an abdominal and pelvic CT scan.  Symptomatically, he feels "alright".  He denies any bone pain, infections, fevers, sweats or weight loss.  He only notes swelling in his legs.  He continues his dialysis every Monday, Wednesday, and Friday.    Past Medical History  Diagnosis Date  . Chronic kidney disease   . Cancer ([place])   . Multiple myeloma ([place])   . COPD (chronic obstructive pulmonary disease) ([place])   . Shortness of breath dyspnea   . Neuropathy ([place])   . GERD (gastroesophageal reflux disease)   . Stroke (cerebrum) (HCC)     weakness Lt hand  . Coronary artery disease   . Myocardial infarction [facility]) 2005    Past Surgical History  Procedure Laterality Date  . Total hip arthroplasty Left   . Appendectomy    . Cholecystectomy    . Av fistula placement Left   . Shoulder arthroscopy with open rotator cuff repair Left 09/19/2015    Procedure: SHOULDER ARTHROSCOPY , subacromial  decompression, debridement;  Surgeon: Corky Mull, MD;  Location: [facility] ORS;  Service: Orthopedics;  Laterality: Left;    Family History  Problem Relation Age of Onset  . Alzheimer's disease Mother   . Kidney failure Father   . Bone cancer Sister   . Stomach cancer Sister     Social History:  reports that he has been smoking Cigarettes.  He has been smoking about 0.25 packs per day. He does not have any smokeless tobacco history on file. He reports that he does not drink alcohol or use illicit drugs.  The patient is accompanied by his wife today.  Allergies: No Known Allergies  Current Medications: Current Outpatient Prescriptions  Medication Sig Dispense Refill  . aspirin EC 81 MG tablet Take 81 mg by mouth daily.    . Cyanocobalamin (VITAMIN B 12 PO) Take by mouth.    . donepezil (ARICEPT) 10 MG tablet Take 1 tablet (10 mg total) by mouth at bedtime. 90 tablet 3  . furosemide (LASIX) 20 MG tablet Take 1 tablet (20 mg total) by mouth daily. 30 tablet 3  . lidocaine-prilocaine (EMLA) cream     . lovastatin (MEVACOR) 40 MG tablet TAKE 1 TABLET BY MOUTH EVERY DAY FOR HIGH CHOLESTEROL    . Multiple Vitamin (MULTI-VITAMINS) TABS Take by mouth. Reported on 04/01/2016    . omeprazole (PRILOSEC) 20 MG capsule Take 1 capsule (20  mg total) by mouth daily. TAKE 1 CAPSULE BY MOUTH EVERY DAY. 90 capsule 3   No current facility-administered medications for this visit.    Review of Systems:  GENERAL:  Feels "alright".  No fevers or sweats.  Weight up 2 pounds. PERFORMANCE STATUS (ECOG):  1 HEENT:  No visual changes, runny nose, sore throat, mouth sores or tenderness. Lungs: No shortness of breath or cough.  No hemoptysis. Cardiac:  No chest pain, palpitations, orthopnea, or PND. GI:  No nausea, vomiting, diarrhea, constipation, melena or hematochezia. GU:  Dialysis every MWF.  No urgency, frequency, dysuria, or hematuria. Musculoskeletal:  Left hip discomfort s/p replacement.  No back pain.   No muscle tenderness. Extremities:  No pain or swelling. Skin:  Bruises easily on baby aspirin.  No rashes or skin changes. Neuro:  Left foot numbness. No headache,weakness, balance or coordination issues. Endocrine:  No diabetes, thyroid issues, hot flashes or night sweats. Psych:  No mood changes, depression or anxiety. Pain:  No focal pain. Review of systems:  All other systems reviewed and found to be negative.  Physical Exam: Blood pressure 106/58, pulse 80, temperature 95.7 F (35.4 C), temperature source Tympanic, resp. rate 18, height _0  (1.905 m), weight 193 lb 3.7 oz (87.65 kg). GENERAL:  Well developed, well nourished, gentleman sitting comfortably in the exam room in no acute distress. MENTAL STATUS:  Alert and oriented to person, place and time. HEAD:  Thin gray hair.  Male pattern baldness.  Normocephalic, atraumatic, face symmetric, no Cushingoid features. EYES:  Silver rimmed glasses.  Blue eyes.  Pupils equal round and reactive to light and accomodation.  No conjunctivitis or scleral icterus. ENT:  Oropharynx clear without lesion.  Tongue normal. Mucous membranes moist.  RESPIRATORY:  Clear to auscultation without rales, wheezes or rhonchi. CARDIOVASCULAR:  Regular rate and rhythm without murmur, rub or gallop. ABDOMEN:  Soft, non-tender, with active bowel sounds, and no hepatosplenomegaly.  No masses. SKIN:  Ecchymosis on arms.  No rashes, ulcers or lesions. EXTREMITIES: 3+ pitting bilateral lower extremity edema below the knees.  No skin discoloration.  No palpable cords. LYMPH NODES:  Left groin with shotty adenopathy.  No palpable cervical, supraclavicular, or axillary adenopathy  NEUROLOGICAL: Unremarkable. PSYCH:  Appropriate.  No visits with results within 3 Day(s) from this visit. Latest known visit with results is:  Appointment on 04/16/2016  Component Date Value Ref Range Status  . WBC 04/16/2016 9.7  3.8 - 10.6 K/uL Final  . RBC 04/16/2016 3.34* 4.40 -  5.90 MIL/uL Final  . Hemoglobin 04/16/2016 10.5* 13.0 - 18.0 g/dL Final  . HCT 04/16/2016 32.6* 40.0 - 52.0 % Final  . MCV 04/16/2016 97.5  80.0 - 100.0 fL Final  . MCH 04/16/2016 31.4  26.0 - 34.0 pg Final  . MCHC 04/16/2016 32.2  32.0 - 36.0 g/dL Final  . RDW 04/16/2016 15.8* 11.5 - 14.5 % Final  . Platelets 04/16/2016 106* 150 - 440 K/uL Final  . Neutrophils Relative % 04/16/2016 85%   Final  . Neutro Abs 04/16/2016 8.3* 1.4 - 6.5 K/uL Final  . Lymphocytes Relative 04/16/2016 10%   Final  . Lymphs Abs 04/16/2016 1.0  1.0 - 3.6 K/uL Final  . Monocytes Relative 04/16/2016 4%   Final  . Monocytes Absolute 04/16/2016 0.4  0.2 - 1.0 K/uL Final  . Eosinophils Relative 04/16/2016 1%   Final  . Eosinophils Absolute 04/16/2016 0.1  0 - 0.7 K/uL Final  . Basophils Relative 04/16/2016 0%  Final  . Basophils Absolute 04/16/2016 0.0  0 - 0.1 K/uL Final  . Total Protein ELP 04/16/2016 7.0  6.0 - 8.5 g/dL Final  . Albumin ELP 04/16/2016 3.6  2.9 - 4.4 g/dL Final  . Alpha-1-Globulin 04/16/2016 0.3  0.0 - 0.4 g/dL Final  . Alpha-2-Globulin 04/16/2016 0.8  0.4 - 1.0 g/dL Final  . Beta Globulin 04/16/2016 1.9* 0.7 - 1.3 g/dL Final  . Gamma Globulin 04/16/2016 0.3* 0.4 - 1.8 g/dL Final  . M-Spike, % 04/16/2016 0.8* Not Observed g/dL Final  . SPE Interp. 04/16/2016 Comment   Final   Comment: (NOTE) The SPE pattern demonstrates a single peak (M-spike) in the beta region which may represent monoclonal protein. This peak may also be caused by the presence of fibrinogen or circulating immune complexes. If clinically indicated, the presence of a monoclonal gammopathy may be confirmed by immunofixation, as well as evaluation of the urine for the presence of Bence-Jones protein. Performed At: [facility] [place], [place] [id number] Lindon Romp MD RC:[id number]   . Comment 04/16/2016 Comment   Final   Comment: (NOTE) Protein electrophoresis scan will follow via computer,  mail, or courier delivery.   Marland Kitchen GLOBULIN, TOTAL 04/16/2016 3.4  2.2 - 3.9 g/dL Corrected  . A/G Ratio 04/16/2016 1.1  0.7 - 1.7 Corrected  . Kappa free light chain 04/16/2016 785.6* 3.3 - 19.4 mg/L Final                 **Please note reference interval change**  . Lamda free light chains 04/16/2016 32.3* 5.7 - 26.3 mg/L Final                 **Please note reference interval change**  . Kappa, lamda light chain ratio 04/16/2016 24.32* 0.26 - 1.65 Final   Comment: (NOTE) Performed At: [facility] [place], [place] [id number] Lindon Romp MD OE:[id number]     Assessment:  DALTIN CRIST is a 82 y.o. 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  Bone marrow revealed 20% plasma cells. He was treated with thalidomide for 7 months then discontinued in 06/2005 secondary to rash and depression. He was treated briefly with Revlimid in 2013 which was also discontinued secondary to rash.    M spike was 0.5 gm/dL on 06/15/2013, 0.8 gm/dL on 01/19/2014, 0.5 gm/dL on 11/22/2014, 0.9 on 06/28/2015, 0.7 on 09/20/2015, 0.6 on 01/02/2016, and 0.8 on 04/16/2016  Kappa free light chains were 512 in 06/15/2013, 686 11/02/2013, 929 on 01/19/2014, 1212 in 05/10/2014, 1409 on 11/22/2014, 1119 on 02/28/2015, 851 (ratio 63.84) on 06/28/2015, 1006 (ratio 74.50) on 09/20/2015, 1133 (ratio 59.94) on 01/02/2016, and 785.6 (ratio 24.32) on 04/16/2016.  He has subsequently been followed off therapy.  He had a hip fracture in in 12/2013.  Per notes, pathology revealed no myeloma.  Bone survey on 01/17/2015 revealed a 9 mm lytic lesion in the frontoparietal region (previously 7 mm).   He has end-stage renal disease unrelated to his myeloma.  He has been on dialysis since 11/2013 (MWF).   He has anemia likely due to chronic renal insufficiency.  He has a history of iron deficiency. He had a negative colonoscopy and upper endoscopy in 08/2004. Notes  indicate he also had a small bowel capsule study. EGD and colonoscopy in 2014 revealed gastric lesions which were cauterized. He receives IV iron and Procrit with dialysis.  Ferritin is elevated (? acute phase  reactant as ESR elevated).  Ferritin was 1121 on 07/05/2015 and 743 on 01/02/2016.  Iron saturation was 30% on 07/05/2015.  He has B12 deficiency.  B12 was 120 on 07/05/2015.  Folate was 10.8.  He is on B12 IM monthy as well as oral B12.   He has a history of left shoulder discomfort.  MRI on 06/14/2015 revealed a tendinopathy, osteoarthritis, and capsulitis.   He has new bilateral lower extremity edema.  Bilateral lower extremity duplex on 04/11/2016 revealed no evidence of DVT.  There was a 3.1 x 2.1 x 1.3 cm possible left groin node.  Symptomatically, he denies any B symptoms, bone pain or infections.  He has mild thrombocytopenia.  M spike is 0.8 gm/dL.  Plan: 1.  Review bone survey and labs.  Discuss slight increase in M spike and increased lucency in parietal bone.  Discuss consideration of  bone marrow aspirate for restaging. 2.  Follow-up scheduled abdominal and pelvic CT scan on 04/30/2016.  If concerning adenopathy, will pursue excisional biopsy. 3.  RTC on 05/01/2016 for MD assessment, review of abdominal and pelvic CT scan, and discussion regarding direction of therapy.   Lequita Asal, MD  04/23/2016, 11:00 AM

## 2016-04-23 NOTE — Progress Notes (Signed)
Pt reports swelling in both feet and lower legs.  Other than swelling reports nothing new.  Bone survey 6/1

## 2016-04-24 DIAGNOSIS — D689 Coagulation defect, unspecified: Secondary | ICD-10-CM | POA: Diagnosis not present

## 2016-04-24 DIAGNOSIS — D509 Iron deficiency anemia, unspecified: Secondary | ICD-10-CM | POA: Diagnosis not present

## 2016-04-24 DIAGNOSIS — D631 Anemia in chronic kidney disease: Secondary | ICD-10-CM | POA: Diagnosis not present

## 2016-04-24 DIAGNOSIS — N186 End stage renal disease: Secondary | ICD-10-CM | POA: Diagnosis not present

## 2016-04-26 DIAGNOSIS — N186 End stage renal disease: Secondary | ICD-10-CM | POA: Diagnosis not present

## 2016-04-26 DIAGNOSIS — D509 Iron deficiency anemia, unspecified: Secondary | ICD-10-CM | POA: Diagnosis not present

## 2016-04-26 DIAGNOSIS — D689 Coagulation defect, unspecified: Secondary | ICD-10-CM | POA: Diagnosis not present

## 2016-04-26 DIAGNOSIS — D631 Anemia in chronic kidney disease: Secondary | ICD-10-CM | POA: Diagnosis not present

## 2016-04-29 DIAGNOSIS — D631 Anemia in chronic kidney disease: Secondary | ICD-10-CM | POA: Diagnosis not present

## 2016-04-29 DIAGNOSIS — N186 End stage renal disease: Secondary | ICD-10-CM | POA: Diagnosis not present

## 2016-04-29 DIAGNOSIS — D689 Coagulation defect, unspecified: Secondary | ICD-10-CM | POA: Diagnosis not present

## 2016-04-29 DIAGNOSIS — D509 Iron deficiency anemia, unspecified: Secondary | ICD-10-CM | POA: Diagnosis not present

## 2016-04-30 ENCOUNTER — Ambulatory Visit
Admission: RE | Admit: 2016-04-30 | Discharge: 2016-04-30 | Disposition: A | Discharge status: Home / Self Care | Payer: Medicare Other | Source: Ambulatory Visit | Attending: Family Medicine | Admitting: Family Medicine

## 2016-04-30 DIAGNOSIS — R911 Solitary pulmonary nodule: Secondary | ICD-10-CM | POA: Insufficient documentation

## 2016-04-30 DIAGNOSIS — K5730 Diverticulosis of large intestine without perforation or abscess without bleeding: Secondary | ICD-10-CM | POA: Diagnosis not present

## 2016-04-30 DIAGNOSIS — N200 Calculus of kidney: Secondary | ICD-10-CM | POA: Diagnosis not present

## 2016-04-30 DIAGNOSIS — N185 Chronic kidney disease, stage 5: Secondary | ICD-10-CM | POA: Diagnosis not present

## 2016-04-30 DIAGNOSIS — I999 Unspecified disorder of circulatory system: Secondary | ICD-10-CM | POA: Diagnosis not present

## 2016-04-30 DIAGNOSIS — I700 Atherosclerosis of aorta: Secondary | ICD-10-CM | POA: Insufficient documentation

## 2016-04-30 DIAGNOSIS — C9000 Multiple myeloma not having achieved remission: Secondary | ICD-10-CM | POA: Insufficient documentation

## 2016-04-30 DIAGNOSIS — N2889 Other specified disorders of kidney and ureter: Secondary | ICD-10-CM | POA: Diagnosis not present

## 2016-05-01 ENCOUNTER — Inpatient Hospital Stay (HOSPITAL_BASED_OUTPATIENT_CLINIC_OR_DEPARTMENT_OTHER): Payer: Medicare Other | Admitting: Hematology and Oncology

## 2016-05-01 ENCOUNTER — Encounter: Payer: Self-pay | Admitting: Hematology and Oncology

## 2016-05-01 VITALS — BP 114/58 | HR 80 | Temp 97.6°F | Ht 75.0 in | Wt 193.5 lb

## 2016-05-01 DIAGNOSIS — M1990 Unspecified osteoarthritis, unspecified site: Secondary | ICD-10-CM | POA: Diagnosis not present

## 2016-05-01 DIAGNOSIS — I2510 Atherosclerotic heart disease of native coronary artery without angina pectoris: Secondary | ICD-10-CM | POA: Diagnosis not present

## 2016-05-01 DIAGNOSIS — M19012 Primary osteoarthritis, left shoulder: Secondary | ICD-10-CM | POA: Diagnosis not present

## 2016-05-01 DIAGNOSIS — N189 Chronic kidney disease, unspecified: Secondary | ICD-10-CM | POA: Diagnosis not present

## 2016-05-01 DIAGNOSIS — D472 Monoclonal gammopathy: Secondary | ICD-10-CM | POA: Diagnosis not present

## 2016-05-01 DIAGNOSIS — K219 Gastro-esophageal reflux disease without esophagitis: Secondary | ICD-10-CM | POA: Diagnosis not present

## 2016-05-01 DIAGNOSIS — R918 Other nonspecific abnormal finding of lung field: Secondary | ICD-10-CM | POA: Diagnosis not present

## 2016-05-01 DIAGNOSIS — M189 Osteoarthritis of first carpometacarpal joint, unspecified: Secondary | ICD-10-CM

## 2016-05-01 DIAGNOSIS — R948 Abnormal results of function studies of other organs and systems: Secondary | ICD-10-CM | POA: Diagnosis not present

## 2016-05-01 DIAGNOSIS — Z992 Dependence on renal dialysis: Secondary | ICD-10-CM | POA: Diagnosis not present

## 2016-05-01 DIAGNOSIS — N200 Calculus of kidney: Secondary | ICD-10-CM

## 2016-05-01 DIAGNOSIS — J449 Chronic obstructive pulmonary disease, unspecified: Secondary | ICD-10-CM | POA: Diagnosis not present

## 2016-05-01 DIAGNOSIS — Z79899 Other long term (current) drug therapy: Secondary | ICD-10-CM | POA: Diagnosis not present

## 2016-05-01 DIAGNOSIS — R600 Localized edema: Secondary | ICD-10-CM

## 2016-05-01 DIAGNOSIS — C9000 Multiple myeloma not having achieved remission: Secondary | ICD-10-CM | POA: Diagnosis not present

## 2016-05-01 DIAGNOSIS — F17210 Nicotine dependence, cigarettes, uncomplicated: Secondary | ICD-10-CM

## 2016-05-01 DIAGNOSIS — N281 Cyst of kidney, acquired: Secondary | ICD-10-CM | POA: Diagnosis not present

## 2016-05-01 DIAGNOSIS — I252 Old myocardial infarction: Secondary | ICD-10-CM | POA: Diagnosis not present

## 2016-05-01 DIAGNOSIS — R911 Solitary pulmonary nodule: Secondary | ICD-10-CM | POA: Diagnosis not present

## 2016-05-01 DIAGNOSIS — Z9181 History of falling: Secondary | ICD-10-CM | POA: Diagnosis not present

## 2016-05-01 DIAGNOSIS — D649 Anemia, unspecified: Secondary | ICD-10-CM | POA: Diagnosis not present

## 2016-05-01 DIAGNOSIS — E538 Deficiency of other specified B group vitamins: Secondary | ICD-10-CM

## 2016-05-01 DIAGNOSIS — Z8673 Personal history of transient ischemic attack (TIA), and cerebral infarction without residual deficits: Secondary | ICD-10-CM | POA: Diagnosis not present

## 2016-05-01 DIAGNOSIS — Z9221 Personal history of antineoplastic chemotherapy: Secondary | ICD-10-CM | POA: Diagnosis not present

## 2016-05-01 DIAGNOSIS — D6959 Other secondary thrombocytopenia: Secondary | ICD-10-CM | POA: Diagnosis not present

## 2016-05-01 DIAGNOSIS — Z7982 Long term (current) use of aspirin: Secondary | ICD-10-CM | POA: Diagnosis not present

## 2016-05-01 NOTE — Progress Notes (Signed)
Pt reports he fell last night before 4:30am.  Pt reports he remembers leaning to get up from recliner and form there does not remember the fall.  Pt has cut to forehead and left two fingers.  Pt woke wife up after waking up and wife reports he did not look different other than cuts.   Pt reports he did not go to the hospital and does not feel the need to. Thinks he just passed out.  No pain related to fall other than the cut.  Vitals signs done sitting and standing.  CT performed on 6/20

## 2016-05-01 NOTE — Progress Notes (Signed)
[facility] day:  05/01/2016   Chief Complaint: Frank Huang is a 82 y.o. male with smoldering multiple myeloma who is seen for review of interval imaging.  HPI:  The patient was last seen in the medical oncology clinic on 04/23/2016.  At that time, he denied any bone pain, infections, fevers, sweats or weight loss. M spike was 0.8 gm/dL (stable).  He had lower extremity edema.  Bilateral lower extremity duplex on 04/11/2016 revealed no evidence of DVT, but a 3.1 x 2.1 x 1.3 cm possible left groin node.  Because of possible adenopathy, an abdominal and pelvic CT scan was scheduled.  Abdomen and pelvic CT scan on 04/30/2016 revealed a 1.3 cm irregularly marginated nodular lesion in the anterior left lower lobe.  There was a subtle area of slightly diminished attenuation near the tail of the pancreas of uncertain significance (MRI would be helpful for further assessment).  There were multiple renal lesions (some did not represent simple cysts). Ultrasound or preferably MRI would be recommended to assess these areas further.  In addition there was a single nonobstructing right upper pole renal calculus.  There was atherosclerotic change of the abdominal aorta. 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  There was no pelvic mass or adenopathy.  Imaging could not exclude mild diverticulitis of the sigmoid colon with multiple rectosigmoid colon diverticula.   He notes a history of smoking 20 packs per month (2 cartons).  He started smoking at age 86 years old.  He notes that he fell the night before at 4:30 AM.  He leaned up to get up from the recliner.  He scraped his forehead and left 2 fingers.  He did not seek medical attention. He has had no further falls.   Past Medical History  Diagnosis Date  . Chronic kidney disease   . Cancer ([place])   . Multiple myeloma ([place])   . COPD (chronic obstructive pulmonary disease) ([place])   .  Shortness of breath dyspnea   . Neuropathy ([place])   . GERD (gastroesophageal reflux disease)   . Stroke (cerebrum) (H[facility])     weakness Lt hand  . Coronary artery disease   . Myocardial infarction [facility]) 2005    Past Surgical History  Procedure Laterality Date  . Total hip arthroplasty Left   . Appendectomy    . Cholecystectomy    . Av fistula placement Left   . Shoulder arthroscopy with open rotator cuff repair Left 09/19/2015    Procedure: SHOULDER ARTHROSCOPY , subacromial decompression, debridement;  Surgeon: Corky Mull, MD;  Location: [facility] ORS;  Service: Orthopedics;  Laterality: Left;    Family History  Problem Relation Age of Onset  . Alzheimer's disease Mother   . Kidney failure Father   . Bone cancer Sister   . Stomach cancer Sister     Social History:  reports that he has been smoking Cigarettes.  He has been smoking about 0.25 packs per day. He does not have any smokeless tobacco history on file. He reports that he does not drink alcohol or use illicit drugs.  He began smoking at age 10.  He smokes 20 packs per month (2 cartons).   He has a 30-40 pack year smoking history.  The patient is accompanied by his wife today.  Allergies: No Known Allergies  Current Medications: Current Outpatient Prescriptions  Medication Sig Dispense Refill  . aspirin EC 81 MG tablet Take 81  mg by mouth daily.    . Cyanocobalamin (VITAMIN B 12 PO) Take by mouth.    . donepezil (ARICEPT) 10 MG tablet Take 1 tablet (10 mg total) by mouth at bedtime. 90 tablet 3  . furosemide (LASIX) 20 MG tablet Take 1 tablet (20 mg total) by mouth daily. 30 tablet 3  . lidocaine-prilocaine (EMLA) cream     . lovastatin (MEVACOR) 40 MG tablet TAKE 1 TABLET BY MOUTH EVERY DAY FOR HIGH CHOLESTEROL    . Multiple Vitamin (MULTI-VITAMINS) TABS Take by mouth. Reported on 04/01/2016    . omeprazole (PRILOSEC) 20 MG capsule Take 1 capsule (20 mg total) by mouth daily. TAKE 1 CAPSULE BY MOUTH EVERY DAY. 90 capsule 3    No current facility-administered medications for this visit.    Review of Systems:  GENERAL:  Feels "okt".  No fevers or sweats.  Weight stable. PERFORMANCE STATUS (ECOG):  1 HEENT:  No visual changes, runny nose, sore throat, mouth sores or tenderness. Lungs: No shortness of breath or cough.  No hemoptysis. Cardiac:  No chest pain, palpitations, orthopnea, or PND. GI:  No nausea, vomiting, diarrhea, constipation, melena or hematochezia. GU:  Dialysis every MWF.  No urgency, frequency, dysuria, or hematuria. Musculoskeletal:  Left hip discomfort s/p replacement.  No back pain.  No muscle tenderness. Extremities:  No pain or swelling. Skin:  Bruises easily on baby aspirin.  No rashes or skin changes. Neuro:  Left foot numbness. No headache,weakness, balance or coordination issues. Endocrine:  No diabetes, thyroid issues, hot flashes or night sweats. Psych:  No mood changes, depression or anxiety. Pain:  No focal pain. Review of systems:  All other systems reviewed and found to be negative.  Physical Exam: Blood pressure 114/58, pulse 80, temperature 97.6 F (36.4 C), temperature source Tympanic, height 6' 3"  (1.905 m), weight 193 lb 7.3 oz (87.75 kg). GENERAL:  Well developed, well nourished, gentleman sitting comfortably in the exam room in no acute distress. MENTAL STATUS:  Alert and oriented to person, place and time. HEAD:  Thin gray hair.  Male pattern baldness.  Normocephalic, atraumatic, face symmetric, no Cushingoid features. EYES:  Silver rimmed glasses.  Blue eyes.  No conjunctivitis or scleral icterus. SKIN:  Ecchymosis on arms.  No rashes, ulcers or lesions. NEUROLOGICAL: Unremarkable. PSYCH:  Appropriate.  No visits with results within 3 Day(s) from this visit. Latest known visit with results is:  Appointment on 04/16/2016  Component Date Value Ref Range Status  . WBC 04/16/2016 9.7  3.8 - 10.6 K/uL Final  . RBC 04/16/2016 3.34* 4.40 - 5.90 MIL/uL Final  .  Hemoglobin 04/16/2016 10.5* 13.0 - 18.0 g/dL Final  . HCT 04/16/2016 32.6* 40.0 - 52.0 % Final  . MCV 04/16/2016 97.5  80.0 - 100.0 fL Final  . MCH 04/16/2016 31.4  26.0 - 34.0 pg Final  . MCHC 04/16/2016 32.2  32.0 - 36.0 g/dL Final  . RDW 04/16/2016 15.8* 11.5 - 14.5 % Final  . Platelets 04/16/2016 106* 150 - 440 K/uL Final  . Neutrophils Relative % 04/16/2016 85%   Final  . Neutro Abs 04/16/2016 8.3* 1.4 - 6.5 K/uL Final  . Lymphocytes Relative 04/16/2016 10%   Final  . Lymphs Abs 04/16/2016 1.0  1.0 - 3.6 K/uL Final  . Monocytes Relative 04/16/2016 4%   Final  . Monocytes Absolute 04/16/2016 0.4  0.2 - 1.0 K/uL Final  . Eosinophils Relative 04/16/2016 1%   Final  . Eosinophils Absolute 04/16/2016 0.1  0 -  0.7 K/uL Final  . Basophils Relative 04/16/2016 0%   Final  . Basophils Absolute 04/16/2016 0.0  0 - 0.1 K/uL Final  . Total Protein ELP 04/16/2016 7.0  6.0 - 8.5 g/dL Final  . Albumin ELP 04/16/2016 3.6  2.9 - 4.4 g/dL Final  . Alpha-1-Globulin 04/16/2016 0.3  0.0 - 0.4 g/dL Final  . Alpha-2-Globulin 04/16/2016 0.8  0.4 - 1.0 g/dL Final  . Beta Globulin 04/16/2016 1.9* 0.7 - 1.3 g/dL Final  . Gamma Globulin 04/16/2016 0.3* 0.4 - 1.8 g/dL Final  . M-Spike, % 04/16/2016 0.8* Not Observed g/dL Final  . SPE Interp. 04/16/2016 Comment   Final   Comment: (NOTE) The SPE pattern demonstrates a single peak (M-spike) in the beta region which may represent monoclonal protein. This peak may also be caused by the presence of fibrinogen or circulating immune complexes. If clinically indicated, the presence of a monoclonal gammopathy may be confirmed by immunofixation, as well as evaluation of the urine for the presence of Bence-Jones protein. Performed At: [facility] [place], [place] [id number] Lindon Romp MD [id number]   . Comment 04/16/2016 Comment   Final   Comment: (NOTE) Protein electrophoresis scan will follow via computer, mail, or courier  delivery.   Marland Kitchen GLOBULIN, TOTAL 04/16/2016 3.4  2.2 - 3.9 g/dL Corrected  . A/G Ratio 04/16/2016 1.1  0.7 - 1.7 Corrected  . Kappa free light chain 04/16/2016 785.6* 3.3 - 19.4 mg/L Final                 **Please note reference interval change**  . Lamda free light chains 04/16/2016 32.3* 5.7 - 26.3 mg/L Final                 **Please note reference interval change**  . Kappa, lamda light chain ratio 04/16/2016 24.32* 0.26 - 1.65 Final   Comment: (NOTE) Performed At: [facility] [place], [place] [id number] Lindon Romp MD [id number]     Assessment:  EPIC TRIBBETT is a 82 y.o. 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  Bone marrow revealed 20% plasma cells. He was treated with thalidomide for 7 months then discontinued in 06/2005 secondary to rash and depression. He was treated briefly with Revlimid in 2013 which was also discontinued secondary to rash.    M spike was 0.5 gm/dL on 06/15/2013, 0.8 gm/dL on 01/19/2014, 0.5 gm/dL on 11/22/2014, 0.9 on 06/28/2015, 0.7 on 09/20/2015, 0.6 on 01/02/2016, and 0.8 on 04/16/2016  Kappa free light chains were 512 in 06/15/2013, 686 11/02/2013, 929 on 01/19/2014, 1212 in 05/10/2014, 1409 on 11/22/2014, 1119 on 02/28/2015, 851 (ratio 63.84) on 06/28/2015, 1006 (ratio 74.50) on 09/20/2015, 1133 (ratio 59.94) on 01/02/2016, and 785.6 (ratio 24.32) on 04/16/2016.  He has subsequently been followed off therapy.  He had a hip fracture in in 12/2013.  Per notes, pathology revealed no myeloma.  Bone survey on 01/17/2015 revealed a 9 mm lytic lesion in the frontoparietal region (previously 7 mm).   He has end-stage renal disease unrelated to his myeloma.  He has been on dialysis since 11/2013 (MWF).   He has anemia likely due to chronic renal insufficiency.  He has a history of iron deficiency. He had a negative colonoscopy and upper endoscopy in 08/2004. Notes indicate he also  had a small bowel capsule study. EGD and colonoscopy in 2014 revealed gastric lesions which were cauterized. He receives IV iron  and Procrit with dialysis.  Ferritin is elevated (? acute phase reactant as ESR elevated).  Ferritin was 1121 on 07/05/2015 and 743 on 01/02/2016.  Iron saturation was 30% on 07/05/2015.  He has B12 deficiency.  B12 was 120 on 07/05/2015.  Folate was 10.8.  He is on B12 IM monthy as well as oral B12.   He has a history of left shoulder discomfort.  MRI on 06/14/2015 revealed a tendinopathy, osteoarthritis, and capsulitis.   He has new bilateral lower extremity edema.  Bilateral lower extremity duplex on 04/11/2016 revealed no evidence of DVT.  There was a 3.1 x 2.1 x 1.3 cm possible left groin node.  Abdomen and pelvic CT scan on 04/30/2016 revealed a 1.3 cm irregularly marginated nodular lesion in the anterior left lower lobe.  There was a subtle area of slightly diminished attenuation near the tail of the pancreas of uncertain significance.  There were multiple renal lesions (some did not represent simple cysts).   Symptomatically, he denies any respiratory symptoms.  He has a 30-40 pack year smoking history.   Plan: 1.  Review abdominal and pelvic CT scan.  Discuss plan for follow-up chest CT. 2.  Schedule chest CT without contrast. 3.  Anticipate follow-up imaging for pancreatic and renal lesions in future. 4.  RTC after chest CT.   Lequita Asal, MD  05/01/2016, 1:45 PM

## 2016-05-02 ENCOUNTER — Ambulatory Visit (INDEPENDENT_AMBULATORY_CARE_PROVIDER_SITE_OTHER): Payer: Medicare Other | Admitting: Family Medicine

## 2016-05-02 VITALS — BP 108/56 | HR 78 | Temp 98.2°F | Resp 16 | Wt 197.0 lb

## 2016-05-02 DIAGNOSIS — M25471 Effusion, right ankle: Secondary | ICD-10-CM | POA: Diagnosis not present

## 2016-05-02 DIAGNOSIS — W19XXXS Unspecified fall, sequela: Secondary | ICD-10-CM

## 2016-05-02 DIAGNOSIS — N185 Chronic kidney disease, stage 5: Secondary | ICD-10-CM | POA: Diagnosis not present

## 2016-05-02 DIAGNOSIS — C9000 Multiple myeloma not having achieved remission: Secondary | ICD-10-CM

## 2016-05-02 DIAGNOSIS — E538 Deficiency of other specified B group vitamins: Secondary | ICD-10-CM | POA: Diagnosis not present

## 2016-05-02 DIAGNOSIS — M25472 Effusion, left ankle: Secondary | ICD-10-CM

## 2016-05-02 MED ORDER — CYANOCOBALAMIN 1000 MCG/ML IJ SOLN
1000.0000 ug | Freq: Once | INTRAMUSCULAR | Status: AC
  Administered 2016-05-02: 1000 ug via INTRAMUSCULAR

## 2016-05-02 NOTE — Progress Notes (Signed)
Patient ID: Frank Huang, male   DOB: 10-08-1932, 82 y.o.   MRN: [id number]    Subjective:  HPI  Patient is here for 2 weeks follow up on edema. This is not better. He has stopped lasix as directed. He saw oncologist yesterday 05/01/16 and followed up CT scan results and also she would lung nodule on the exam and he is having a scan tomorrow to evaluate this further. Patient sleeps in a recliner. No PND orthopnea or chest pain. He was getting up from the recliner a couple of days ago and fell and hit his head. It is unclear whether this could've been a syncopal episode. Patient and his wife would like to go over results from imaging tests. Prior to Admission medications   Medication Sig Start Date End Date Taking? Authorizing Provider  aspirin EC 81 MG tablet Take 81 mg by mouth daily.   Yes Historical Provider, MD  Cyanocobalamin (VITAMIN B 12 PO) Take by mouth.   Yes Historical Provider, MD  donepezil (ARICEPT) 10 MG tablet Take 1 tablet (10 mg total) by mouth at bedtime. 01/02/16  Yes Chrishauna Mee Maceo Pro., MD  lidocaine-prilocaine (EMLA) cream  04/18/16  Yes Historical Provider, MD  lovastatin (MEVACOR) 40 MG tablet TAKE 1 TABLET BY MOUTH EVERY DAY FOR HIGH CHOLESTEROL 08/10/14  Yes Historical Provider, MD  Multiple Vitamin (MULTI-VITAMINS) TABS Take by mouth. Reported on 04/01/2016   Yes Historical Provider, MD  omeprazole (PRILOSEC) 20 MG capsule Take 1 capsule (20 mg total) by mouth daily. TAKE 1 CAPSULE BY MOUTH EVERY DAY. 10/26/15  Yes [name]., MD  furosemide (LASIX) 20 MG tablet Take 1 tablet (20 mg total) by mouth daily. Patient not taking: Reported on 05/02/2016 04/11/16   [name]., MD    Patient Active Problem List   Diagnosis Date Noted  . Nodule of left lung 05/01/2016  . Persistent atrial fibrillation ([place]) 02/05/2016  . B12 deficiency 07/25/2015  . Deficiency of vitamin B 07/25/2015  . Chest pain 07/01/2015  . Multiple myeloma ([place]) 04/12/2015  .  Arteriosclerosis of coronary artery 04/09/2015  . CAFL (chronic airflow limitation) ([place]) 04/09/2015  . Chronic kidney disease requiring chronic dialysis ([place]) 04/09/2015  . Gastro-esophageal reflux disease without esophagitis 04/09/2015  . Gout 04/09/2015  . H/O acute myocardial infarction 04/09/2015  . HLD (hyperlipidemia) 04/09/2015  . BP (high blood pressure) 04/09/2015  . Bad memory 04/09/2015  . 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 Kidney failure 04/09/2015  . End-stage renal disease ([place]) 04/09/2015  . Chronic kidney disease (CKD), stage V ([place]) 07/20/2013  . Chronic kidney disease, stage V ([place]) 07/20/2013  . Absolute anemia 03/31/2013  . Neuropathy ([place]) 03/31/2013    Past Medical History  Diagnosis Date  . Chronic kidney disease   . Cancer ([place])   . Multiple myeloma ([place])   . COPD (chronic obstructive pulmonary disease) ([place])   . Shortness of breath dyspnea   . Neuropathy ([place])   . GERD (gastroesophageal reflux disease)   . Stroke (cerebrum) ([facility])     weakness Lt hand  . Coronary artery disease   . Myocardial infarction [facility]) 2005    Social History   Social History  . Marital Status: Married    Spouse Name: N/A  . Number of Children: N/A  . Years of Education: N/A   Occupational History  . Not on file.   Social History Main Topics  . Smoking status:  Current Every Day Smoker -- 0.25 packs/day    Types: Cigarettes  . Smokeless tobacco: Not on file     Comment: smokes about 3 cigarettes per week  . Alcohol Use: No  . Drug Use: No  . Sexual Activity: Not on file   Other Topics Concern  . Not on file   Social History Narrative    No Known Allergies  Review of Systems  Constitutional: Positive for malaise/fatigue. Negative for fever and chills.  HENT: Negative.   Eyes: Negative.   Respiratory: Negative.   Cardiovascular: Positive for leg swelling. Negative for chest pain and palpitations.  Gastrointestinal:  Negative.   Musculoskeletal: Positive for joint pain and falls.  Skin: Negative.        Abrasion of the forehead after a fall  Neurological: Positive for weakness.  Endo/Heme/Allergies: Negative.   Psychiatric/Behavioral: Positive for memory loss.    Immunization History  Administered Date(s) Administered  . Influenza, High Dose Seasonal PF 08/03/2015   Objective:  BP 108/56 mmHg  Pulse 78  Temp(Src) 98.2 F (36.8 C)  Resp 16  Wt 197 lb (89.359 kg)  Physical Exam  Constitutional: He is oriented to person, place, and time and well-developed, well-nourished, and in no distress.  HENT:  Head: Normocephalic and atraumatic.  Right Ear: External ear normal.  Left Ear: External ear normal.  Nose: Nose normal.  Eyes: Conjunctivae are normal. Pupils are equal, round, and reactive to light.  Neck: Normal range of motion. Neck supple.  Cardiovascular: Normal rate, regular rhythm and intact distal pulses.   Murmur heard.  Systolic murmur is present with a grade of 2/6  Pulmonary/Chest: Effort normal and breath sounds normal. No respiratory distress. He has no wheezes.  Abdominal: Soft.  Musculoskeletal: He exhibits edema (3+).  3+ lower extremity edema noted. Now bilateral.  Neurological: He is alert and oriented to person, place, and time.  Skin: Skin is warm and dry.  Psychiatric: Mood, affect and judgment normal.    Lab Results  Component Value Date   WBC 9.7 04/16/2016   HGB 10.5* 04/16/2016   HCT 32.6* 04/16/2016   PLT 106* 04/16/2016   GLUCOSE 91 04/11/2016   CHOL 163 08/30/2015   TRIG 78 08/30/2015   HDL 43 08/30/2015   LDLCALC 104* 08/30/2015   TSH 1.080 08/30/2015   INR 1.0 12/11/2013   HGBA1C 4.7* 08/30/2015    CMP     Component Value Date/Time   NA 140 04/11/2016 1537   NA 135 01/02/2016 1023   NA 140 09/29/2014 0848   K 4.5 04/11/2016 1537   K 3.8 09/29/2014 0848   CL 95* 04/11/2016 1537   CL 98 09/29/2014 0848   CO2 27 04/11/2016 1537   CO2 35*  09/29/2014 0848   GLUCOSE 91 04/11/2016 1537   GLUCOSE 93 01/02/2016 1023   GLUCOSE 117* 09/29/2014 0848   BUN 33* 04/11/2016 1537   BUN 35* 01/02/2016 1023   BUN 23* 09/29/2014 0848   CREATININE 3.83* 04/11/2016 1537   CREATININE 4.49* 09/29/2014 0848   CALCIUM 9.3 04/11/2016 1537   CALCIUM 9.2 11/22/2014 1042   PROT 8.0 01/02/2016 1023   PROT 7.4 09/29/2014 0848   ALBUMIN 3.9 04/11/2016 1537   ALBUMIN 4.2 01/02/2016 1023   ALBUMIN 3.3* 09/29/2014 0848   AST 15 01/02/2016 1023   AST 16 09/29/2014 0848   ALT 8* 01/02/2016 1023   ALT 14 09/29/2014 0848   ALKPHOS 65 01/02/2016 1023   ALKPHOS 91 09/29/2014 0848  BILITOT 1.1 01/02/2016 1023   BILITOT 0.6 09/29/2014 0848   GFRNONAA 14* 04/11/2016 1537   GFRNONAA 14* 09/29/2014 0848   GFRNONAA 19* 02/08/2014 2042   GFRAA 16* 04/11/2016 1537   GFRAA 16* 09/29/2014 0848   GFRAA 22* 02/08/2014 2042    Assessment and Plan :  1. B12 deficiency Administered today. - cyanocobalamin ((VITAMIN B-12)) injection 1,000 mcg; Inject 1 mL (1,000 mcg total) into the muscle once. Reviewed with patient and his wife all the recent scans that have been done and what is scheduled. 2. Swelling of both ankles Not better. Getting dialysis. Recent scan showed no clot or blockage to be causing edema. Next step will refer back to Dr. Ubaldo Glassing for further evaluation Patient has ejection fraction of 30%-this can contribute to the edema. Advised to use support hose. May need referral to vascular specialist. Chronic venous insufficiency very likely along with chronic kidney disease and chronic ischemic cardiomyopathy.. Recommend support hose. 3. Chronic kidney disease (CKD), stage V (HCC) Stable.  4. Multiple myeloma, remission status unspecified [facility]) Following oncologist.  5. Fall/possible syncope EKG stable today Patient was seen and examined by Dr. Eulas Post and note was scribed by Theressa Millard, RMA. 6. Dementia 7. Chronic CHF with  EF of 30%. Refer back to Dr. Ubaldo Glassing. I do not think the patient had a syncopal event but this is unclear.maybe low-grade chronic CHF. Have tried low-dose diuretic. Also renal failure thought think this will be ineffective. Not sure what the patient's dry weight is. Weight is relatively stable for the year. Miguel Aschoff MD [facility] 05/02/2016 11:29 AM

## 2016-05-03 ENCOUNTER — Ambulatory Visit
Admission: RE | Admit: 2016-05-03 | Discharge: 2016-05-03 | Disposition: A | Discharge status: Home / Self Care | Payer: Medicare Other | Source: Ambulatory Visit | Attending: Hematology and Oncology | Admitting: Hematology and Oncology

## 2016-05-03 DIAGNOSIS — J439 Emphysema, unspecified: Secondary | ICD-10-CM | POA: Diagnosis not present

## 2016-05-03 DIAGNOSIS — N186 End stage renal disease: Secondary | ICD-10-CM | POA: Diagnosis not present

## 2016-05-03 DIAGNOSIS — R918 Other nonspecific abnormal finding of lung field: Secondary | ICD-10-CM | POA: Insufficient documentation

## 2016-05-03 DIAGNOSIS — R911 Solitary pulmonary nodule: Secondary | ICD-10-CM

## 2016-05-03 DIAGNOSIS — N281 Cyst of kidney, acquired: Secondary | ICD-10-CM | POA: Insufficient documentation

## 2016-05-03 DIAGNOSIS — I2510 Atherosclerotic heart disease of native coronary artery without angina pectoris: Secondary | ICD-10-CM | POA: Insufficient documentation

## 2016-05-03 DIAGNOSIS — D631 Anemia in chronic kidney disease: Secondary | ICD-10-CM | POA: Diagnosis not present

## 2016-05-03 DIAGNOSIS — D689 Coagulation defect, unspecified: Secondary | ICD-10-CM | POA: Diagnosis not present

## 2016-05-03 DIAGNOSIS — D509 Iron deficiency anemia, unspecified: Secondary | ICD-10-CM | POA: Diagnosis not present

## 2016-05-06 DIAGNOSIS — D631 Anemia in chronic kidney disease: Secondary | ICD-10-CM | POA: Diagnosis not present

## 2016-05-06 DIAGNOSIS — D509 Iron deficiency anemia, unspecified: Secondary | ICD-10-CM | POA: Diagnosis not present

## 2016-05-06 DIAGNOSIS — N186 End stage renal disease: Secondary | ICD-10-CM | POA: Diagnosis not present

## 2016-05-06 DIAGNOSIS — D689 Coagulation defect, unspecified: Secondary | ICD-10-CM | POA: Diagnosis not present

## 2016-05-07 ENCOUNTER — Ambulatory Visit
Admission: RE | Admit: 2016-05-07 | Discharge: 2016-05-07 | Disposition: A | Discharge status: Home / Self Care | Payer: Medicare Other | Source: Ambulatory Visit | Attending: Family Medicine | Admitting: Family Medicine

## 2016-05-07 ENCOUNTER — Ambulatory Visit (INDEPENDENT_AMBULATORY_CARE_PROVIDER_SITE_OTHER): Payer: Medicare Other | Admitting: Family Medicine

## 2016-05-07 ENCOUNTER — Encounter: Payer: Self-pay | Admitting: Family Medicine

## 2016-05-07 VITALS — BP 120/60 | HR 107 | Temp 97.5°F | Resp 16 | Wt 193.0 lb

## 2016-05-07 DIAGNOSIS — M545 Low back pain: Secondary | ICD-10-CM | POA: Insufficient documentation

## 2016-05-07 NOTE — Patient Instructions (Addendum)
X-Ray LS Spine Use heat for low back pain as needed.

## 2016-05-07 NOTE — Progress Notes (Signed)
Patient: Frank Huang Male    DOB: 1932/04/15   82 y.o.   MRN: [id number] Visit Date: 05/07/2016  Today's Provider: Wilhemena Durie, MD   Chief Complaint  Patient presents with  . Back Pain   Subjective:    Back Pain This is a new problem. The current episode started today. The problem occurs constantly. The problem is unchanged. The pain is present in the lumbar spine. The quality of the pain is described as cramping. The pain radiates to the right thigh. The pain is at a severity of 8/10. The pain is moderate. The pain is the same all the time. The symptoms are aggravated by position. Stiffness is present all day. Associated symptoms include weakness. Pertinent negatives include no abdominal pain, bladder incontinence, bowel incontinence, chest pain, dysuria, fever, headaches, leg pain, numbness, paresis, paresthesias, pelvic pain, perianal numbness or tingling. Treatments tried: Aleve. The treatment provided mild relief.   Back pain started this morning when he got up out of his chair. Patient a pop in mid lower back. When he sat back down he has been in constant pain since. Right leg feels weak and doesn't move right when he is walking.     No Known Allergies Current Meds  Medication Sig  . aspirin EC 81 MG tablet Take 81 mg by mouth daily.  . Cyanocobalamin (VITAMIN B 12 PO) Take by mouth.  . donepezil (ARICEPT) 10 MG tablet Take 1 tablet (10 mg total) by mouth at bedtime.  . furosemide (LASIX) 20 MG tablet Take 1 tablet (20 mg total) by mouth daily.  Marland Kitchen lidocaine-prilocaine (EMLA) cream   . lovastatin (MEVACOR) 40 MG tablet TAKE 1 TABLET BY MOUTH EVERY DAY FOR HIGH CHOLESTEROL  . Multiple Vitamin (MULTI-VITAMINS) TABS Take by mouth. Reported on 04/01/2016  . omeprazole (PRILOSEC) 20 MG capsule Take 1 capsule (20 mg total) by mouth daily. TAKE 1 CAPSULE BY MOUTH EVERY DAY.    Review of Systems  Constitutional: Negative for fever, chills and appetite change.  HENT:  Negative.   Eyes: Negative.   Respiratory: Negative for chest tightness, shortness of breath and wheezing.   Cardiovascular: Negative for chest pain and palpitations.  Gastrointestinal: Negative for nausea, vomiting, abdominal pain and bowel incontinence.  Endocrine: Negative.   Genitourinary: Negative for bladder incontinence, dysuria and pelvic pain.  Musculoskeletal: Positive for back pain.  Neurological: Positive for weakness. Negative for tingling, numbness, headaches and paresthesias.  Psychiatric/Behavioral: Negative.     Social History  Substance Use Topics  . Smoking status: Current Every Day Smoker -- 0.25 packs/day    Types: Cigarettes  . Smokeless tobacco: Not on file     Comment: smokes about 3 cigarettes per week  . Alcohol Use: No   Objective:   BP 120/60 mmHg  Pulse 107  Temp(Src) 97.5 F (36.4 C) (Oral)  Resp 16  Wt 193 lb (87.544 kg)  SpO2 96%  Physical Exam  Constitutional: He is oriented to person, place, and time. He appears well-developed and well-nourished.  HENT:  Head: Normocephalic and atraumatic.  Right Ear: External ear normal.  Left Ear: External ear normal.  Nose: Nose normal.  Eyes: Conjunctivae are normal.  Neck: Neck supple.  Cardiovascular: Normal rate, regular rhythm and normal heart sounds.   Pulmonary/Chest: Effort normal.  Abdominal: Soft.  Musculoskeletal: He exhibits tenderness.  Minimal tenderness over L1-L2 level. No skin rash and he has full range of motion in  lower back. Gait is  normal.  Neurological: He is alert and oriented to person, place, and time.  Skin: Skin is warm and dry.  [place]  Psychiatric: He has a normal mood and affect. His behavior is normal. Judgment and thought content normal.        Assessment & Plan:     1. Midline low back pain, with sciatica presence unspecified R/o LS Compression fracture. - DG Lumbar Spine Complete; Future 2.CRF      Richard Cranford Mon, MD  [facility]

## 2016-05-08 ENCOUNTER — Ambulatory Visit: Payer: Self-pay

## 2016-05-09 ENCOUNTER — Inpatient Hospital Stay (HOSPITAL_BASED_OUTPATIENT_CLINIC_OR_DEPARTMENT_OTHER): Payer: Medicare Other | Admitting: Hematology and Oncology

## 2016-05-09 ENCOUNTER — Encounter: Payer: Self-pay | Admitting: Hematology and Oncology

## 2016-05-09 VITALS — BP 95/46 | HR 81 | Temp 98.1°F | Resp 21 | Wt 193.1 lb

## 2016-05-09 DIAGNOSIS — Z9181 History of falling: Secondary | ICD-10-CM | POA: Diagnosis not present

## 2016-05-09 DIAGNOSIS — R948 Abnormal results of function studies of other organs and systems: Secondary | ICD-10-CM

## 2016-05-09 DIAGNOSIS — I252 Old myocardial infarction: Secondary | ICD-10-CM | POA: Diagnosis not present

## 2016-05-09 DIAGNOSIS — N189 Chronic kidney disease, unspecified: Secondary | ICD-10-CM | POA: Diagnosis not present

## 2016-05-09 DIAGNOSIS — R918 Other nonspecific abnormal finding of lung field: Secondary | ICD-10-CM

## 2016-05-09 DIAGNOSIS — Z79899 Other long term (current) drug therapy: Secondary | ICD-10-CM | POA: Diagnosis not present

## 2016-05-09 DIAGNOSIS — Z992 Dependence on renal dialysis: Secondary | ICD-10-CM | POA: Diagnosis not present

## 2016-05-09 DIAGNOSIS — D649 Anemia, unspecified: Secondary | ICD-10-CM | POA: Diagnosis not present

## 2016-05-09 DIAGNOSIS — D6959 Other secondary thrombocytopenia: Secondary | ICD-10-CM | POA: Diagnosis not present

## 2016-05-09 DIAGNOSIS — E538 Deficiency of other specified B group vitamins: Secondary | ICD-10-CM | POA: Diagnosis not present

## 2016-05-09 DIAGNOSIS — I2510 Atherosclerotic heart disease of native coronary artery without angina pectoris: Secondary | ICD-10-CM | POA: Diagnosis not present

## 2016-05-09 DIAGNOSIS — J449 Chronic obstructive pulmonary disease, unspecified: Secondary | ICD-10-CM | POA: Diagnosis not present

## 2016-05-09 DIAGNOSIS — R600 Localized edema: Secondary | ICD-10-CM

## 2016-05-09 DIAGNOSIS — M1990 Unspecified osteoarthritis, unspecified site: Secondary | ICD-10-CM | POA: Diagnosis not present

## 2016-05-09 DIAGNOSIS — C9000 Multiple myeloma not having achieved remission: Secondary | ICD-10-CM | POA: Diagnosis not present

## 2016-05-09 DIAGNOSIS — D472 Monoclonal gammopathy: Secondary | ICD-10-CM

## 2016-05-09 DIAGNOSIS — N200 Calculus of kidney: Secondary | ICD-10-CM | POA: Diagnosis not present

## 2016-05-09 DIAGNOSIS — N281 Cyst of kidney, acquired: Secondary | ICD-10-CM | POA: Diagnosis not present

## 2016-05-09 DIAGNOSIS — K219 Gastro-esophageal reflux disease without esophagitis: Secondary | ICD-10-CM | POA: Diagnosis not present

## 2016-05-09 DIAGNOSIS — Z8673 Personal history of transient ischemic attack (TIA), and cerebral infarction without residual deficits: Secondary | ICD-10-CM | POA: Diagnosis not present

## 2016-05-09 DIAGNOSIS — M19012 Primary osteoarthritis, left shoulder: Secondary | ICD-10-CM | POA: Diagnosis not present

## 2016-05-09 DIAGNOSIS — R911 Solitary pulmonary nodule: Secondary | ICD-10-CM | POA: Diagnosis not present

## 2016-05-09 DIAGNOSIS — Z7982 Long term (current) use of aspirin: Secondary | ICD-10-CM | POA: Diagnosis not present

## 2016-05-09 DIAGNOSIS — Z9221 Personal history of antineoplastic chemotherapy: Secondary | ICD-10-CM | POA: Diagnosis not present

## 2016-05-09 NOTE — Progress Notes (Signed)
Here to go over chest CT results with patient. His bil LE edema is improved with support stockings. Has a cardiology appt next Monday to make sure it is not a cardiac problem. He has intermittent back pain. Tires easily. Has low energy.

## 2016-05-09 NOTE — Progress Notes (Signed)
[facility] day:  05/09/2016   Chief Complaint: Frank Huang is a 82 y.o. male with smoldering multiple myeloma who is seen for review of interval chest CT and discussion regarding direction of therapy.  HPI:  The patient was last seen in the medical oncology clinic on 05/01/2016.  At that time, abdomen and pelvic CT scan from 04/30/2016 was reviewed.  Imaging studies revealed a 1.3 cm irregularly marginated nodular lesion in the anterior left lower lobe and a subtle area of slightly diminished attenuation near the tail of the pancreas of uncertain significance.  Decision was made to proceed with chest CT.  Chest CT on 05/03/2016 revealed a 1.05 cm ill-defined slightly spiculated irregular nodule in the left lower lobe as well as a 0.8 cm nodule in the right lower lobe.  PET scan was recommended  for further evaluation. There was advanced atherosclerotic calcifications involving the thoracic and upper abdominal aorta and dense three-vessel coronary artery calcifications. There was mild emphysematous changes and pulmonary scarring as well as numerous renal cysts.  He notes that his back is hurting more today. Lumbar spine Lane films on 05/07/2016 revealed no acute or chronic bony abnormality of the lumbar spine.  His lower extremity edema has improved with support hose.  He has a follow-up appointment with cardiology, Dr. Ubaldo Glassing, on 05/13/2016.   Past Medical History  Diagnosis Date  . Chronic kidney disease   . Cancer ([place])   . Multiple myeloma ([place])   . COPD (chronic obstructive pulmonary disease) ([place])   . Shortness of breath dyspnea   . Neuropathy ([place])   . GERD (gastroesophageal reflux disease)   . Stroke (cerebrum) ([facility])     weakness Lt hand  . Coronary artery disease   . Myocardial infarction [facility]) 2005    Past Surgical History  Procedure Laterality Date  . Total hip arthroplasty Left   . Appendectomy    . Cholecystectomy    . Av fistula  placement Left   . Shoulder arthroscopy with open rotator cuff repair Left 09/19/2015    Procedure: SHOULDER ARTHROSCOPY , subacromial decompression, debridement;  Surgeon: Corky Mull, MD;  Location: [facility] ORS;  Service: Orthopedics;  Laterality: Left;    Family History  Problem Relation Age of Onset  . Alzheimer's disease Mother   . Kidney failure Father   . Bone cancer Sister   . Stomach cancer Sister     Social History:  reports that he has been smoking Cigarettes.  He has been smoking about 0.25 packs per day. He does not have any smokeless tobacco history on file. He reports that he does not drink alcohol or use illicit drugs.  He began smoking at age 76. He smokes 20 packs per month (2 cartons). He has a 30-40 pack year smoking historyThe patient is accompanied by his wife today.  Allergies: No Known Allergies  Current Medications: Current Outpatient Prescriptions  Medication Sig Dispense Refill  . aspirin EC 81 MG tablet Take 81 mg by mouth daily.    . Cyanocobalamin (VITAMIN B 12 PO) Take by mouth.    . donepezil (ARICEPT) 10 MG tablet Take 1 tablet (10 mg total) by mouth at bedtime. 90 tablet 3  . furosemide (LASIX) 20 MG tablet Take 1 tablet (20 mg total) by mouth daily. 30 tablet 3  . lidocaine-prilocaine (EMLA) cream     . lovastatin (MEVACOR) 40 MG tablet TAKE 1 TABLET BY MOUTH EVERY DAY FOR HIGH  CHOLESTEROL    . Multiple Vitamin (MULTI-VITAMINS) TABS Take by mouth. Reported on 04/01/2016    . omeprazole (PRILOSEC) 20 MG capsule Take 1 capsule (20 mg total) by mouth daily. TAKE 1 CAPSULE BY MOUTH EVERY DAY. 90 capsule 3   No current facility-administered medications for this visit.    Review of Systems:  GENERAL:  Feels "ok".  No fevers or sweats.  Weight stable. PERFORMANCE STATUS (ECOG):  1 HEENT:  No visual changes, runny nose, sore throat, mouth sores or tenderness. Lungs: No shortness of breath or cough.  No hemoptysis. Cardiac:  No chest pain, palpitations,  orthopnea, or PND. GI:  No nausea, vomiting, diarrhea, constipation, melena or hematochezia. GU:  Dialysis every MWF.  No urgency, frequency, dysuria, or hematuria. Musculoskeletal:  Left hip discomfort s/p replacement.  Back pain 3 days/week.  Left shoulder discomfort.  No muscle tenderness. Extremities:  No pain or swelling. Skin:  Bruises easily on baby aspirin.  No rashes or skin changes. Neuro:  Left foot numbness. No headache,weakness, balance or coordination issues. Endocrine:  No diabetes, thyroid issues, hot flashes or night sweats. Psych:  No mood changes, depression or anxiety. Pain:  No focal pain. Review of systems:  All other systems reviewed and found to be negative.  Physical Exam: Blood pressure 95/46, pulse 81, temperature 98.1 F (36.7 C), temperature source Oral, resp. rate 21, weight 193 lb 2 oz (87.6 kg), SpO2 100 %. GENERAL:  Well developed, well nourished, gentleman sitting comfortably in the exam room in no acute distress. MENTAL STATUS:  Alert and oriented to person, place and time. HEAD:  Thin gray hair.  Male pattern baldness.  Normocephalic, atraumatic, face symmetric, no Cushingoid features. EYES:  Silver rimmed glasses.  Blue eyes.  No conjunctivitis or scleral icterus. SKIN:  Ecchymosis on arms.  No rashes, ulcers or lesions.  NEUROLOGICAL: Unremarkable. PSYCH:  Appropriate.  No visits with results within 3 Day(s) from this visit. Latest known visit with results is:  Appointment on 04/16/2016  Component Date Value Ref Range Status  . WBC 04/16/2016 9.7  3.8 - 10.6 K/uL Final  . RBC 04/16/2016 3.34* 4.40 - 5.90 MIL/uL Final  . Hemoglobin 04/16/2016 10.5* 13.0 - 18.0 g/dL Final  . HCT 04/16/2016 32.6* 40.0 - 52.0 % Final  . MCV 04/16/2016 97.5  80.0 - 100.0 fL Final  . MCH 04/16/2016 31.4  26.0 - 34.0 pg Final  . MCHC 04/16/2016 32.2  32.0 - 36.0 g/dL Final  . RDW 04/16/2016 15.8* 11.5 - 14.5 % Final  . Platelets 04/16/2016 106* 150 - 440 K/uL Final   . Neutrophils Relative % 04/16/2016 85%   Final  . Neutro Abs 04/16/2016 8.3* 1.4 - 6.5 K/uL Final  . Lymphocytes Relative 04/16/2016 10%   Final  . Lymphs Abs 04/16/2016 1.0  1.0 - 3.6 K/uL Final  . Monocytes Relative 04/16/2016 4%   Final  . Monocytes Absolute 04/16/2016 0.4  0.2 - 1.0 K/uL Final  . Eosinophils Relative 04/16/2016 1%   Final  . Eosinophils Absolute 04/16/2016 0.1  0 - 0.7 K/uL Final  . Basophils Relative 04/16/2016 0%   Final  . Basophils Absolute 04/16/2016 0.0  0 - 0.1 K/uL Final  . Total Protein ELP 04/16/2016 7.0  6.0 - 8.5 g/dL Final  . Albumin ELP 04/16/2016 3.6  2.9 - 4.4 g/dL Final  . Alpha-1-Globulin 04/16/2016 0.3  0.0 - 0.4 g/dL Final  . Alpha-2-Globulin 04/16/2016 0.8  0.4 - 1.0 g/dL Final  .  Beta Globulin 04/16/2016 1.9* 0.7 - 1.3 g/dL Final  . Gamma Globulin 04/16/2016 0.3* 0.4 - 1.8 g/dL Final  . M-Spike, % 04/16/2016 0.8* Not Observed g/dL Final  . SPE Interp. 04/16/2016 Comment   Final   Comment: (NOTE) The SPE pattern demonstrates a single peak (M-spike) in the beta region which may represent monoclonal protein. This peak may also be caused by the presence of fibrinogen or circulating immune complexes. If clinically indicated, the presence of a monoclonal gammopathy may be confirmed by immunofixation, as well as evaluation of the urine for the presence of Bence-Jones protein. Performed At: [facility] [place], [place] [id number] Lindon Romp MD [id number]   . Comment 04/16/2016 Comment   Final   Comment: (NOTE) Protein electrophoresis scan will follow via computer, mail, or courier delivery.   Marland Kitchen GLOBULIN, TOTAL 04/16/2016 3.4  2.2 - 3.9 g/dL Corrected  . A/G Ratio 04/16/2016 1.1  0.7 - 1.7 Corrected  . Kappa free light chain 04/16/2016 785.6* 3.3 - 19.4 mg/L Final                 **Please note reference interval change**  . Lamda free light chains 04/16/2016 32.3* 5.7 - 26.3 mg/L Final                  **Please note reference interval change**  . Kappa, lamda light chain ratio 04/16/2016 24.32* 0.26 - 1.65 Final   Comment: (NOTE) Performed At: [facility] [place], [place] [id number] Lindon Romp MD [id number]:[id number]     Assessment:  Frank Huang is a 83 y.o. 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  Bone marrow revealed 20% plasma cells. He was treated with thalidomide for 7 months then discontinued in 06/2005 secondary to rash and depression. He was treated briefly with Revlimid in 2013 which was also discontinued secondary to rash.    M spike was 0.5 gm/dL on 06/15/2013, 0.8 gm/dL on 01/19/2014, 0.5 gm/dL on 11/22/2014, 0.9 on 06/28/2015, 0.7 on 09/20/2015, 0.6 on 01/02/2016, and 0.8 on 04/16/2016  Kappa free light chains were 512 in 06/15/2013, 686 11/02/2013, 929 on 01/19/2014, 1212 in 05/10/2014, 1409 on 11/22/2014, 1119 on 02/28/2015, 851 (ratio 63.84) on 06/28/2015, 1006 (ratio 74.50) on 09/20/2015, 1133 (ratio 59.94) on 01/02/2016, and 785.6 (ratio 24.32) on 04/16/2016.  He has subsequently been followed off therapy.  He had a hip fracture in in 12/2013.  Per notes, pathology revealed no myeloma.  Bone survey on 01/17/2015 revealed a 9 mm lytic lesion in the frontoparietal region (previously 7 mm).   He has end-stage renal disease unrelated to his myeloma.  He has been on dialysis since 11/2013 (MWF).   He has anemia likely due to chronic renal insufficiency.  He has a history of iron deficiency. He had a negative colonoscopy and upper endoscopy in 08/2004. Notes indicate he also had a small bowel capsule study. EGD and colonoscopy in 2014 revealed gastric lesions which were cauterized. He receives IV iron and Procrit with dialysis.  Ferritin is elevated (? acute phase reactant as ESR elevated).  Ferritin was 1121 on 07/05/2015 and 743 on 01/02/2016.  Iron saturation was 30% on 07/05/2015.  He has B12  deficiency.  B12 was 120 on 07/05/2015.  Folate was 10.8.  He is on B12 IM monthy as well as oral B12.   He has a history of left shoulder discomfort.  MRI on 06/14/2015  revealed a tendinopathy, osteoarthritis, and capsulitis.   Abdomen and pelvic CT scan on 04/30/2016 revealed a 1.3 cm irregularly marginated nodular lesion in the anterior left lower lobe. There was a subtle area of slightly diminished attenuation near the tail of the pancreas of uncertain significance. There were multiple renal lesions (some did not represent simple cysts).   Chest CT on 05/03/2016 revealed a 1.05 cm ill-defined slightly spiculated irregular nodule in the left lower lobe as well as a 0.8 cm nodule in the right lower lobe.    Symptomatically, he denies any respiratory symptoms.  He smokes 1/2 pack per day.  Plan: 1.  Review chest CT.  Discuss plan for PET scan to assess metabolic activity.  Discuss management of pulmonary nodules.  Discuss possible biopsy or resection if LLL lesion suspicious for primary lung cancer.  Discuss PET scan to ensure no other concerning area (r/o metastatic disease).  Discuss issues regarding treatment given patient's age.  Discuss multi-disciplinary approach to lung cancer.  Multiple questions asked and answered. 2.  Schedule PET scan. 3.  RTC after PET scan.   Lequita Asal, MD  05/09/2016, 1:59 PM

## 2016-05-10 DIAGNOSIS — D509 Iron deficiency anemia, unspecified: Secondary | ICD-10-CM | POA: Diagnosis not present

## 2016-05-10 DIAGNOSIS — Z992 Dependence on renal dialysis: Secondary | ICD-10-CM | POA: Diagnosis not present

## 2016-05-10 DIAGNOSIS — N186 End stage renal disease: Secondary | ICD-10-CM | POA: Diagnosis not present

## 2016-05-10 DIAGNOSIS — D631 Anemia in chronic kidney disease: Secondary | ICD-10-CM | POA: Diagnosis not present

## 2016-05-10 DIAGNOSIS — I120 Hypertensive chronic kidney disease with stage 5 chronic kidney disease or end stage renal disease: Secondary | ICD-10-CM | POA: Diagnosis not present

## 2016-05-10 DIAGNOSIS — D689 Coagulation defect, unspecified: Secondary | ICD-10-CM | POA: Diagnosis not present

## 2016-05-11 DIAGNOSIS — R911 Solitary pulmonary nodule: Secondary | ICD-10-CM | POA: Insufficient documentation

## 2016-05-13 DIAGNOSIS — D631 Anemia in chronic kidney disease: Secondary | ICD-10-CM | POA: Diagnosis not present

## 2016-05-13 DIAGNOSIS — N186 End stage renal disease: Secondary | ICD-10-CM | POA: Diagnosis not present

## 2016-05-13 DIAGNOSIS — D689 Coagulation defect, unspecified: Secondary | ICD-10-CM | POA: Diagnosis not present

## 2016-05-13 DIAGNOSIS — I10 Essential (primary) hypertension: Secondary | ICD-10-CM | POA: Diagnosis not present

## 2016-05-13 DIAGNOSIS — R011 Cardiac murmur, unspecified: Secondary | ICD-10-CM | POA: Diagnosis not present

## 2016-05-13 DIAGNOSIS — I2510 Atherosclerotic heart disease of native coronary artery without angina pectoris: Secondary | ICD-10-CM | POA: Diagnosis not present

## 2016-05-13 DIAGNOSIS — I481 Persistent atrial fibrillation: Secondary | ICD-10-CM | POA: Diagnosis not present

## 2016-05-16 ENCOUNTER — Ambulatory Visit: Admission: RE | Admit: 2016-05-16 | Payer: Medicare Other | Source: Ambulatory Visit

## 2016-05-16 ENCOUNTER — Ambulatory Visit
Admission: RE | Admit: 2016-05-16 | Discharge: 2016-05-16 | Disposition: A | Discharge status: Home / Self Care | Payer: Medicare Other | Source: Ambulatory Visit | Attending: Hematology and Oncology | Admitting: Hematology and Oncology

## 2016-05-16 DIAGNOSIS — R911 Solitary pulmonary nodule: Secondary | ICD-10-CM | POA: Insufficient documentation

## 2016-05-16 DIAGNOSIS — K116 Mucocele of salivary gland: Secondary | ICD-10-CM | POA: Diagnosis not present

## 2016-05-16 DIAGNOSIS — C9000 Multiple myeloma not having achieved remission: Secondary | ICD-10-CM | POA: Diagnosis not present

## 2016-05-16 LAB — GLUCOSE, CAPILLARY: Glucose-Capillary: 72 mg/dL (ref 65–99)

## 2016-05-16 MED ORDER — FLUDEOXYGLUCOSE F - 18 (FDG) INJECTION
12.2000 | Freq: Once | INTRAVENOUS | Status: AC | PRN
  Administered 2016-05-16: 12.2 via INTRAVENOUS

## 2016-05-17 ENCOUNTER — Inpatient Hospital Stay: Payer: Medicare Other | Attending: Hematology and Oncology | Admitting: Hematology and Oncology

## 2016-05-17 ENCOUNTER — Encounter: Payer: Self-pay | Admitting: Hematology and Oncology

## 2016-05-17 VITALS — BP 102/59 | HR 80 | Temp 97.0°F | Wt 193.4 lb

## 2016-05-17 DIAGNOSIS — C9000 Multiple myeloma not having achieved remission: Secondary | ICD-10-CM

## 2016-05-17 DIAGNOSIS — K219 Gastro-esophageal reflux disease without esophagitis: Secondary | ICD-10-CM

## 2016-05-17 DIAGNOSIS — Z79899 Other long term (current) drug therapy: Secondary | ICD-10-CM | POA: Insufficient documentation

## 2016-05-17 DIAGNOSIS — F17210 Nicotine dependence, cigarettes, uncomplicated: Secondary | ICD-10-CM | POA: Insufficient documentation

## 2016-05-17 DIAGNOSIS — I252 Old myocardial infarction: Secondary | ICD-10-CM | POA: Diagnosis not present

## 2016-05-17 DIAGNOSIS — R918 Other nonspecific abnormal finding of lung field: Secondary | ICD-10-CM | POA: Diagnosis not present

## 2016-05-17 DIAGNOSIS — D649 Anemia, unspecified: Secondary | ICD-10-CM | POA: Diagnosis not present

## 2016-05-17 DIAGNOSIS — M1990 Unspecified osteoarthritis, unspecified site: Secondary | ICD-10-CM | POA: Insufficient documentation

## 2016-05-17 DIAGNOSIS — N186 End stage renal disease: Secondary | ICD-10-CM | POA: Insufficient documentation

## 2016-05-17 DIAGNOSIS — Z888 Allergy status to other drugs, medicaments and biological substances status: Secondary | ICD-10-CM | POA: Diagnosis not present

## 2016-05-17 DIAGNOSIS — I2510 Atherosclerotic heart disease of native coronary artery without angina pectoris: Secondary | ICD-10-CM | POA: Diagnosis not present

## 2016-05-17 DIAGNOSIS — J449 Chronic obstructive pulmonary disease, unspecified: Secondary | ICD-10-CM

## 2016-05-17 DIAGNOSIS — K118 Other diseases of salivary glands: Secondary | ICD-10-CM

## 2016-05-17 DIAGNOSIS — Z8673 Personal history of transient ischemic attack (TIA), and cerebral infarction without residual deficits: Secondary | ICD-10-CM | POA: Insufficient documentation

## 2016-05-17 DIAGNOSIS — D631 Anemia in chronic kidney disease: Secondary | ICD-10-CM | POA: Diagnosis not present

## 2016-05-17 DIAGNOSIS — E538 Deficiency of other specified B group vitamins: Secondary | ICD-10-CM

## 2016-05-17 DIAGNOSIS — Z7982 Long term (current) use of aspirin: Secondary | ICD-10-CM | POA: Diagnosis not present

## 2016-05-17 DIAGNOSIS — D689 Coagulation defect, unspecified: Secondary | ICD-10-CM | POA: Diagnosis not present

## 2016-05-17 DIAGNOSIS — R911 Solitary pulmonary nodule: Secondary | ICD-10-CM

## 2016-05-17 NOTE — Progress Notes (Signed)
Patient here for follow up/results. No concerns today.

## 2016-05-17 NOTE — Progress Notes (Signed)
[facility] day:  05/17/2016   Chief Complaint: Frank Huang is a 82 y.o. male with smoldering multiple myeloma who is seen for review of interval PET and discussion regarding direction of therapy.  HPI:  The patient was last seen in the medical oncology clinic on 05/09/2016.  At that time, chest CT from 05/03/2016 was reviewed.  Imaging studies revealed a 1.05 cm ill-defined slightly spiculated irregular nodule in the left lower lobe as well as a 0.8 cm nodule in the right lower lobe.  PET scan was recommended  for further evaluation.   PET scan on 05/16/2016 revealed no evidence of active skeletal multiple myeloma or plasmacytoma.  The LEFT lower lobe pulmonary nodule was 9 mm and had mild metabolic activity (SUV 1.5) with a differential including bronchogenic carcinoma versus an inflammatory nodule. Recommend follow-up CT in 3 months.  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 Recommend ENT consultation.  Symptomatically, he denies any new complaints.   Past Medical History  Diagnosis Date  . Chronic kidney disease   . Cancer ([place])   . Multiple myeloma ([place])   . COPD (chronic obstructive pulmonary disease) ([place])   . Shortness of breath dyspnea   . Neuropathy ([place])   . GERD (gastroesophageal reflux disease)   . Stroke (cerebrum) ([facility])     weakness Lt hand  . Coronary artery disease   . Myocardial infarction [facility]) 2005    Past Surgical History  Procedure Laterality Date  . Total hip arthroplasty Left   . Appendectomy    . Cholecystectomy    . Av fistula placement Left   . Shoulder arthroscopy with open rotator cuff repair Left 09/19/2015    Procedure: SHOULDER ARTHROSCOPY , subacromial decompression, debridement;  Surgeon: Corky Mull, MD;  Location: [facility] ORS;  Service: Orthopedics;  Laterality: Left;    Family  History  Problem Relation Age of Onset  . Alzheimer's disease Mother   . Kidney failure Father   . Bone cancer Sister   . Stomach cancer Sister     Social History:  reports that he has been smoking Cigarettes.  He has been smoking about 0.25 packs per day. He does not have any smokeless tobacco history on file. He reports that he does not drink alcohol or use illicit drugs.  He began smoking at age 56. He smokes 20 packs per month (2 cartons). He has a 30-40 pack year smoking history.  The patient is accompanied by his wife today.  Allergies: No Known Allergies  Current Medications: Current Outpatient Prescriptions  Medication Sig Dispense Refill  . aspirin EC 81 MG tablet Take 81 mg by mouth daily.    . Cyanocobalamin (VITAMIN B 12 PO) Take by mouth.    . donepezil (ARICEPT) 10 MG tablet Take 1 tablet (10 mg total) by mouth at bedtime. 90 tablet 3  . furosemide (LASIX) 20 MG tablet Take 1 tablet (20 mg total) by mouth daily. 30 tablet 3  . lidocaine-prilocaine (EMLA) cream     . lovastatin (MEVACOR) 40 MG tablet TAKE 1 TABLET BY MOUTH EVERY DAY FOR HIGH CHOLESTEROL    . Multiple Vitamin (MULTI-VITAMINS) TABS Take by mouth. Reported on 04/01/2016    . omeprazole (PRILOSEC) 20 MG capsule Take 1 capsule (20 mg total) by mouth daily. TAKE 1 CAPSULE BY MOUTH EVERY DAY. 90 capsule 3  No current facility-administered medications for this visit.    Review of Systems:  GENERAL:  Feels "ok".  No fevers or sweats.  Weight stable. PERFORMANCE STATUS (ECOG):  1 HEENT:  No visual changes, runny nose, sore throat, mouth sores or tenderness. Lungs: No shortness of breath or cough.  No hemoptysis. Cardiac:  No chest pain, palpitations, orthopnea, or PND. GI:  No nausea, vomiting, diarrhea, constipation, melena or hematochezia. GU:  Dialysis every MWF.  No urgency, frequency, dysuria, or hematuria. Musculoskeletal:  Left hip discomfort s/p replacement.  Back pain 3 days/week.  Left shoulder  discomfort.  No muscle tenderness. Extremities:  No pain or swelling. Skin:  Bruises easily on baby aspirin.  No rashes or skin changes. Neuro:  Left foot numbness. No headache,weakness, balance or coordination issues. Endocrine:  No diabetes, thyroid issues, hot flashes or night sweats. Psych:  No mood changes, depression or anxiety. Pain:  No focal pain. Review of systems:  All other systems reviewed and found to be negative.  Physical Exam: Blood pressure 102/59, pulse 80, temperature 97 F (36.1 C), temperature source Tympanic, weight 193 lb 6.4 oz (87.726 kg). GENERAL:  Well developed, well nourished, gentleman sitting comfortably in the exam room in no acute distress. MENTAL STATUS:  Alert and oriented to person, place and time. HEAD:  Thin gray hair.  Male pattern baldness.  Normocephalic, atraumatic, face symmetric, no Cushingoid features.  No palpable lesion. EYES:  Silver rimmed glasses.  Blue eyes.  No conjunctivitis or scleral icterus. SKIN:  Ecchymosis on arms.  Multiple moles.  No rashes, ulcers or lesions.  NEUROLOGICAL: Unremarkable. PSYCH:  Appropriate.  Hospital Outpatient Visit on 05/16/2016  Component Date Value Ref Range Status  . Glucose-Capillary 05/16/2016 72  65 - 99 mg/dL Final    Assessment:  Frank Huang is a 82 y.o. 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  Bone marrow revealed 20% plasma cells. He was treated with thalidomide for 7 months then discontinued in 06/2005 secondary to rash and depression. He was treated briefly with Revlimid in 2013 which was also discontinued secondary to rash.    M spike was 0.5 gm/dL on 06/15/2013, 0.8 gm/dL on 01/19/2014, 0.5 gm/dL on 11/22/2014, 0.9 on 06/28/2015, 0.7 on 09/20/2015, 0.6 on 01/02/2016, and 0.8 on 04/16/2016  Kappa free light chains were 512 in 06/15/2013, 686 11/02/2013, 929 on 01/19/2014, 1212 in 05/10/2014, 1409 on 11/22/2014, 1119 on 02/28/2015, 851  (ratio 63.84) on 06/28/2015, 1006 (ratio 74.50) on 09/20/2015, 1133 (ratio 59.94) on 01/02/2016, and 785.6 (ratio 24.32) on 04/16/2016.  He has subsequently been followed off therapy.  He had a hip fracture in in 12/2013.  Per notes, pathology revealed no myeloma.  Bone survey on 01/17/2015 revealed a 9 mm lytic lesion in the frontoparietal region (previously 7 mm).   He has end-stage renal disease unrelated to his myeloma.  He has been on dialysis since 11/2013 (MWF).   He has anemia likely due to chronic renal insufficiency.  He has a history of iron deficiency. He had a negative colonoscopy and upper endoscopy in 08/2004. Notes indicate he also had a small bowel capsule study. EGD and colonoscopy in 2014 revealed gastric lesions which were cauterized. He receives IV iron and Procrit with dialysis.  Ferritin is elevated (? acute phase reactant as ESR elevated).  Ferritin was 1121 on 07/05/2015 and 743 on 01/02/2016.  Iron saturation was 30% on 07/05/2015.  He has B12 deficiency.  B12  was 120 on 07/05/2015.  Folate was 10.8.  He is on B12 IM monthy as well as oral B12.   He has a history of left shoulder discomfort.  MRI on 06/14/2015 revealed a tendinopathy, osteoarthritis, and capsulitis.   Abdomen and pelvic CT scan on 04/30/2016 revealed a 1.3 cm irregularly marginated nodular lesion in the anterior left lower lobe. There was a subtle area of slightly diminished attenuation near the tail of the pancreas of uncertain significance. There were multiple renal lesions (some did not represent simple cysts).   Chest CT on 05/03/2016 revealed a 1.05 cm ill-defined slightly spiculated irregular nodule in the left lower lobe as well as a 0.8 cm nodule in the right lower lobe.  PET scan on 05/16/2016 revealed no evidence of active skeletal multiple myeloma or plasmacytoma.  The LEFT lower lobe pulmonary nodule was 9 mm and had mild metabolic activity (SUV 1.5).  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   Symptomatically, he denies any respiratory symptoms.  He smokes 1/2 pack per day.  Plan: 1.  Review PET images with patient.  Discus plan for follow-up chest CT in 3 months.  Discuss referral to ENT. 2.  Schedule chest CT in 3 months. 3.  ENT referral. 4.  Anticipate reassessment of myeloma (CBC with diff, CMP, SPEP, free light chains) around 07/17/2016. 5.  RTC after chest CT.   Lequita Asal, MD  05/17/2016, 3:42 PM

## 2016-05-20 DIAGNOSIS — N186 End stage renal disease: Secondary | ICD-10-CM | POA: Diagnosis not present

## 2016-05-20 DIAGNOSIS — D631 Anemia in chronic kidney disease: Secondary | ICD-10-CM | POA: Diagnosis not present

## 2016-05-20 DIAGNOSIS — D689 Coagulation defect, unspecified: Secondary | ICD-10-CM | POA: Diagnosis not present

## 2016-05-21 ENCOUNTER — Telehealth: Payer: Self-pay

## 2016-05-21 NOTE — Telephone Encounter (Signed)
Patients wife called in regards to referral to Dr. Tami Ribas.  Called office and they did not have any information about referral.

## 2016-05-22 DIAGNOSIS — N186 End stage renal disease: Secondary | ICD-10-CM | POA: Diagnosis not present

## 2016-05-22 DIAGNOSIS — D689 Coagulation defect, unspecified: Secondary | ICD-10-CM | POA: Diagnosis not present

## 2016-05-22 DIAGNOSIS — D631 Anemia in chronic kidney disease: Secondary | ICD-10-CM | POA: Diagnosis not present

## 2016-05-22 NOTE — Telephone Encounter (Signed)
Called ENT and they have appt for him 7/25 at 9:30 but pt needs to arrive 30 min early for new pt paper work.  Called home number and got voicemail and left message with appt date and time, location of office and office # for ENT if any questions and main number at cancer center if pt has questions for me

## 2016-05-24 DIAGNOSIS — D689 Coagulation defect, unspecified: Secondary | ICD-10-CM | POA: Diagnosis not present

## 2016-05-24 DIAGNOSIS — D631 Anemia in chronic kidney disease: Secondary | ICD-10-CM | POA: Diagnosis not present

## 2016-05-24 DIAGNOSIS — N186 End stage renal disease: Secondary | ICD-10-CM | POA: Diagnosis not present

## 2016-05-27 DIAGNOSIS — N186 End stage renal disease: Secondary | ICD-10-CM | POA: Diagnosis not present

## 2016-05-27 DIAGNOSIS — D689 Coagulation defect, unspecified: Secondary | ICD-10-CM | POA: Diagnosis not present

## 2016-05-27 DIAGNOSIS — D631 Anemia in chronic kidney disease: Secondary | ICD-10-CM | POA: Diagnosis not present

## 2016-05-29 DIAGNOSIS — N186 End stage renal disease: Secondary | ICD-10-CM | POA: Diagnosis not present

## 2016-05-29 DIAGNOSIS — D631 Anemia in chronic kidney disease: Secondary | ICD-10-CM | POA: Diagnosis not present

## 2016-05-29 DIAGNOSIS — D689 Coagulation defect, unspecified: Secondary | ICD-10-CM | POA: Diagnosis not present

## 2016-05-30 DIAGNOSIS — R011 Cardiac murmur, unspecified: Secondary | ICD-10-CM | POA: Diagnosis not present

## 2016-06-03 DIAGNOSIS — D631 Anemia in chronic kidney disease: Secondary | ICD-10-CM | POA: Diagnosis not present

## 2016-06-03 DIAGNOSIS — N186 End stage renal disease: Secondary | ICD-10-CM | POA: Diagnosis not present

## 2016-06-03 DIAGNOSIS — D689 Coagulation defect, unspecified: Secondary | ICD-10-CM | POA: Diagnosis not present

## 2016-06-04 DIAGNOSIS — D2322 Other benign neoplasm of skin of left ear and external auricular canal: Secondary | ICD-10-CM | POA: Diagnosis not present

## 2016-06-04 DIAGNOSIS — D37030 Neoplasm of uncertain behavior of the parotid salivary glands: Secondary | ICD-10-CM | POA: Diagnosis not present

## 2016-06-05 ENCOUNTER — Ambulatory Visit (INDEPENDENT_AMBULATORY_CARE_PROVIDER_SITE_OTHER): Payer: Medicare Other

## 2016-06-05 DIAGNOSIS — D631 Anemia in chronic kidney disease: Secondary | ICD-10-CM | POA: Diagnosis not present

## 2016-06-05 DIAGNOSIS — E538 Deficiency of other specified B group vitamins: Secondary | ICD-10-CM

## 2016-06-05 DIAGNOSIS — N186 End stage renal disease: Secondary | ICD-10-CM | POA: Diagnosis not present

## 2016-06-05 DIAGNOSIS — D689 Coagulation defect, unspecified: Secondary | ICD-10-CM | POA: Diagnosis not present

## 2016-06-05 MED ORDER — CYANOCOBALAMIN 1000 MCG/ML IJ SOLN
1000.0000 ug | Freq: Once | INTRAMUSCULAR | Status: AC
  Administered 2016-06-05: 1000 ug via INTRAMUSCULAR

## 2016-06-06 DIAGNOSIS — I10 Essential (primary) hypertension: Secondary | ICD-10-CM | POA: Diagnosis not present

## 2016-06-06 DIAGNOSIS — I481 Persistent atrial fibrillation: Secondary | ICD-10-CM | POA: Diagnosis not present

## 2016-06-06 DIAGNOSIS — I2510 Atherosclerotic heart disease of native coronary artery without angina pectoris: Secondary | ICD-10-CM | POA: Diagnosis not present

## 2016-06-06 DIAGNOSIS — E7800 Pure hypercholesterolemia, unspecified: Secondary | ICD-10-CM | POA: Diagnosis not present

## 2016-06-07 DIAGNOSIS — N186 End stage renal disease: Secondary | ICD-10-CM | POA: Diagnosis not present

## 2016-06-07 DIAGNOSIS — D689 Coagulation defect, unspecified: Secondary | ICD-10-CM | POA: Diagnosis not present

## 2016-06-07 DIAGNOSIS — D631 Anemia in chronic kidney disease: Secondary | ICD-10-CM | POA: Diagnosis not present

## 2016-06-10 DIAGNOSIS — D689 Coagulation defect, unspecified: Secondary | ICD-10-CM | POA: Diagnosis not present

## 2016-06-10 DIAGNOSIS — D631 Anemia in chronic kidney disease: Secondary | ICD-10-CM | POA: Diagnosis not present

## 2016-06-10 DIAGNOSIS — N186 End stage renal disease: Secondary | ICD-10-CM | POA: Diagnosis not present

## 2016-06-10 DIAGNOSIS — I120 Hypertensive chronic kidney disease with stage 5 chronic kidney disease or end stage renal disease: Secondary | ICD-10-CM | POA: Diagnosis not present

## 2016-06-10 DIAGNOSIS — Z992 Dependence on renal dialysis: Secondary | ICD-10-CM | POA: Diagnosis not present

## 2016-06-11 ENCOUNTER — Other Ambulatory Visit: Payer: Self-pay | Admitting: Otolaryngology

## 2016-06-11 DIAGNOSIS — K118 Other diseases of salivary glands: Secondary | ICD-10-CM

## 2016-06-12 DIAGNOSIS — D689 Coagulation defect, unspecified: Secondary | ICD-10-CM | POA: Diagnosis not present

## 2016-06-12 DIAGNOSIS — D509 Iron deficiency anemia, unspecified: Secondary | ICD-10-CM | POA: Diagnosis not present

## 2016-06-12 DIAGNOSIS — D631 Anemia in chronic kidney disease: Secondary | ICD-10-CM | POA: Diagnosis not present

## 2016-06-12 DIAGNOSIS — N186 End stage renal disease: Secondary | ICD-10-CM | POA: Diagnosis not present

## 2016-06-17 ENCOUNTER — Other Ambulatory Visit: Payer: Self-pay | Admitting: Physician Assistant

## 2016-06-17 ENCOUNTER — Ambulatory Visit: Payer: Medicare Other

## 2016-06-17 DIAGNOSIS — N186 End stage renal disease: Secondary | ICD-10-CM | POA: Diagnosis not present

## 2016-06-17 DIAGNOSIS — D689 Coagulation defect, unspecified: Secondary | ICD-10-CM | POA: Diagnosis not present

## 2016-06-17 DIAGNOSIS — D631 Anemia in chronic kidney disease: Secondary | ICD-10-CM | POA: Diagnosis not present

## 2016-06-17 DIAGNOSIS — D509 Iron deficiency anemia, unspecified: Secondary | ICD-10-CM | POA: Diagnosis not present

## 2016-06-18 ENCOUNTER — Ambulatory Visit
Admission: RE | Admit: 2016-06-18 | Discharge: 2016-06-18 | Disposition: A | Discharge status: Home / Self Care | Payer: Medicare Other | Source: Ambulatory Visit | Attending: Otolaryngology | Admitting: Otolaryngology

## 2016-06-18 DIAGNOSIS — K118 Other diseases of salivary glands: Secondary | ICD-10-CM

## 2016-06-18 MED ORDER — MIDAZOLAM HCL 5 MG/5ML IJ SOLN
INTRAMUSCULAR | Status: AC | PRN
  Administered 2016-06-18: 1 mg via INTRAVENOUS

## 2016-06-18 MED ORDER — MIDAZOLAM HCL 5 MG/5ML IJ SOLN
INTRAMUSCULAR | Status: AC
  Filled 2016-06-18: qty 5

## 2016-06-18 MED ORDER — FENTANYL CITRATE (PF) 100 MCG/2ML IJ SOLN
INTRAMUSCULAR | Status: AC
  Filled 2016-06-18: qty 2

## 2016-06-18 MED ORDER — FENTANYL CITRATE (PF) 100 MCG/2ML IJ SOLN
INTRAMUSCULAR | Status: AC | PRN
  Administered 2016-06-18: 50 ug via INTRAVENOUS

## 2016-06-18 MED ORDER — HYDROCODONE-ACETAMINOPHEN 5-325 MG PO TABS: 1.0000 | ORAL_TABLET | ORAL | Status: DC | PRN

## 2016-06-18 MED ORDER — SODIUM CHLORIDE 0.9 % IV SOLN
INTRAVENOUS | Status: DC
  Administered 2016-06-18: 1000 mL via INTRAVENOUS

## 2016-06-18 NOTE — Sedation Documentation (Signed)
  Procedure cancelled. Unable to obtain specimen.  See MD note.

## 2016-06-18 NOTE — CV Procedure (Signed)
Due to small size of lesion and difficult angle of approach, fine needle aspiration was not successful.

## 2016-06-18 NOTE — Procedures (Signed)
Discussed procedure and risks with patient. Informed consent obtained. Will perform US-guided right parotid biopsy.

## 2016-06-19 DIAGNOSIS — D689 Coagulation defect, unspecified: Secondary | ICD-10-CM | POA: Diagnosis not present

## 2016-06-19 DIAGNOSIS — N186 End stage renal disease: Secondary | ICD-10-CM | POA: Diagnosis not present

## 2016-06-19 DIAGNOSIS — D631 Anemia in chronic kidney disease: Secondary | ICD-10-CM | POA: Diagnosis not present

## 2016-06-19 DIAGNOSIS — D509 Iron deficiency anemia, unspecified: Secondary | ICD-10-CM | POA: Diagnosis not present

## 2016-06-24 ENCOUNTER — Other Ambulatory Visit: Payer: Self-pay | Admitting: Otolaryngology

## 2016-06-24 DIAGNOSIS — D631 Anemia in chronic kidney disease: Secondary | ICD-10-CM | POA: Diagnosis not present

## 2016-06-24 DIAGNOSIS — D37030 Neoplasm of uncertain behavior of the parotid salivary glands: Secondary | ICD-10-CM | POA: Diagnosis not present

## 2016-06-24 DIAGNOSIS — D689 Coagulation defect, unspecified: Secondary | ICD-10-CM | POA: Diagnosis not present

## 2016-06-24 DIAGNOSIS — J310 Chronic rhinitis: Secondary | ICD-10-CM | POA: Diagnosis not present

## 2016-06-24 DIAGNOSIS — N186 End stage renal disease: Secondary | ICD-10-CM | POA: Diagnosis not present

## 2016-06-24 DIAGNOSIS — K118 Other diseases of salivary glands: Secondary | ICD-10-CM

## 2016-06-24 DIAGNOSIS — D509 Iron deficiency anemia, unspecified: Secondary | ICD-10-CM | POA: Diagnosis not present

## 2016-06-24 DIAGNOSIS — H6122 Impacted cerumen, left ear: Secondary | ICD-10-CM | POA: Diagnosis not present

## 2016-06-26 DIAGNOSIS — L821 Other seborrheic keratosis: Secondary | ICD-10-CM | POA: Diagnosis not present

## 2016-06-26 DIAGNOSIS — D489 Neoplasm of uncertain behavior, unspecified: Secondary | ICD-10-CM | POA: Diagnosis not present

## 2016-06-26 DIAGNOSIS — N186 End stage renal disease: Secondary | ICD-10-CM | POA: Diagnosis not present

## 2016-06-26 DIAGNOSIS — D689 Coagulation defect, unspecified: Secondary | ICD-10-CM | POA: Diagnosis not present

## 2016-06-26 DIAGNOSIS — Z85828 Personal history of other malignant neoplasm of skin: Secondary | ICD-10-CM | POA: Diagnosis not present

## 2016-06-26 DIAGNOSIS — D509 Iron deficiency anemia, unspecified: Secondary | ICD-10-CM | POA: Diagnosis not present

## 2016-06-26 DIAGNOSIS — L814 Other melanin hyperpigmentation: Secondary | ICD-10-CM | POA: Diagnosis not present

## 2016-06-26 DIAGNOSIS — L570 Actinic keratosis: Secondary | ICD-10-CM | POA: Diagnosis not present

## 2016-06-26 DIAGNOSIS — D631 Anemia in chronic kidney disease: Secondary | ICD-10-CM | POA: Diagnosis not present

## 2016-06-26 DIAGNOSIS — D485 Neoplasm of uncertain behavior of skin: Secondary | ICD-10-CM | POA: Diagnosis not present

## 2016-06-27 ENCOUNTER — Ambulatory Visit: Payer: Medicare Other | Admitting: Family Medicine

## 2016-06-28 DIAGNOSIS — D631 Anemia in chronic kidney disease: Secondary | ICD-10-CM | POA: Diagnosis not present

## 2016-06-28 DIAGNOSIS — N186 End stage renal disease: Secondary | ICD-10-CM | POA: Diagnosis not present

## 2016-06-28 DIAGNOSIS — D509 Iron deficiency anemia, unspecified: Secondary | ICD-10-CM | POA: Diagnosis not present

## 2016-06-28 DIAGNOSIS — D689 Coagulation defect, unspecified: Secondary | ICD-10-CM | POA: Diagnosis not present

## 2016-07-01 DIAGNOSIS — N186 End stage renal disease: Secondary | ICD-10-CM | POA: Diagnosis not present

## 2016-07-01 DIAGNOSIS — D509 Iron deficiency anemia, unspecified: Secondary | ICD-10-CM | POA: Diagnosis not present

## 2016-07-01 DIAGNOSIS — D689 Coagulation defect, unspecified: Secondary | ICD-10-CM | POA: Diagnosis not present

## 2016-07-01 DIAGNOSIS — D631 Anemia in chronic kidney disease: Secondary | ICD-10-CM | POA: Diagnosis not present

## 2016-07-03 DIAGNOSIS — N186 End stage renal disease: Secondary | ICD-10-CM | POA: Diagnosis not present

## 2016-07-03 DIAGNOSIS — D689 Coagulation defect, unspecified: Secondary | ICD-10-CM | POA: Diagnosis not present

## 2016-07-03 DIAGNOSIS — D631 Anemia in chronic kidney disease: Secondary | ICD-10-CM | POA: Diagnosis not present

## 2016-07-03 DIAGNOSIS — D509 Iron deficiency anemia, unspecified: Secondary | ICD-10-CM | POA: Diagnosis not present

## 2016-07-05 ENCOUNTER — Ambulatory Visit (INDEPENDENT_AMBULATORY_CARE_PROVIDER_SITE_OTHER): Payer: Medicare Other

## 2016-07-05 DIAGNOSIS — E538 Deficiency of other specified B group vitamins: Secondary | ICD-10-CM | POA: Diagnosis not present

## 2016-07-05 MED ORDER — CYANOCOBALAMIN 1000 MCG/ML IJ SOLN
1000.0000 ug | Freq: Once | INTRAMUSCULAR | Status: AC
  Administered 2016-07-05: 1000 ug via INTRAMUSCULAR

## 2016-07-08 ENCOUNTER — Ambulatory Visit (INDEPENDENT_AMBULATORY_CARE_PROVIDER_SITE_OTHER): Payer: Medicare Other | Admitting: Family Medicine

## 2016-07-08 VITALS — BP 78/20 | HR 80 | Temp 97.8°F | Wt 188.0 lb

## 2016-07-08 DIAGNOSIS — D631 Anemia in chronic kidney disease: Secondary | ICD-10-CM | POA: Diagnosis not present

## 2016-07-08 DIAGNOSIS — D689 Coagulation defect, unspecified: Secondary | ICD-10-CM | POA: Diagnosis not present

## 2016-07-08 DIAGNOSIS — D509 Iron deficiency anemia, unspecified: Secondary | ICD-10-CM | POA: Diagnosis not present

## 2016-07-08 DIAGNOSIS — I959 Hypotension, unspecified: Secondary | ICD-10-CM

## 2016-07-08 DIAGNOSIS — N186 End stage renal disease: Secondary | ICD-10-CM | POA: Diagnosis not present

## 2016-07-08 NOTE — Progress Notes (Signed)
JLYN BRACAMONTE  MRN: [id number] DOB: June 25, 1932  Subjective:  HPI  The patient is an 82 year old male who presents today for follow up of his chronic conditions.  While in the exam room the patient and his wife related that while at dialysis this morning he passed out.  His wife said that they were not told what it was coming from.  She does state that the patient has been missing about 1 of 3 treatments each week.  She states this is because he just does not want to go.   The patient complains of muscle cramps frequently, but his wife can't get him to drink fluids.  The patient's wife states his memory has been getting worse.  She states that she is having to constantly tell him what is going on.  She relates that the patient has started to cook something and then forget that he had the stove on.  This is happening once in a while, maybe ever 4-6 weeks.  He does not remember to take his medications without the help of his wife.    The patient complains of his low back hurting.  He states it has been hurting off and on for about 4-6 years.  He has pain about once per week and it can last anywhere from an hour to 2 days.  Mostly 2 days and then it is very stiff.    Patient Active Problem List   Diagnosis Date Noted  . Parotid mass 05/17/2016  . Nodule of right lung 05/11/2016  . Nodule of left lung 05/01/2016  . Persistent atrial fibrillation ([place]) 02/05/2016  . B12 deficiency 07/25/2015  . Deficiency of vitamin B 07/25/2015  . Chest pain 07/01/2015  . Multiple myeloma ([place]) 04/12/2015  . Arteriosclerosis of coronary artery 04/09/2015  . CAFL (chronic airflow limitation) ([place]) 04/09/2015  . Chronic kidney disease requiring chronic dialysis ([place]) 04/09/2015  . Gastro-esophageal reflux disease without esophagitis 04/09/2015  . Gout 04/09/2015  . H/O acute myocardial infarction 04/09/2015  . HLD (hyperlipidemia) 04/09/2015  . BP (high blood pressure) 04/09/2015  . Bad memory 04/09/2015  .  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 Kidney failure 04/09/2015  . End-stage renal disease ([place]) 04/09/2015  . Chronic kidney disease (CKD), stage V ([place]) 07/20/2013  . Chronic kidney disease, stage V ([place]) 07/20/2013  . Absolute anemia 03/31/2013  . Neuropathy ([place]) 03/31/2013    Past Medical History:  Diagnosis Date  . Cancer ([place])   . Chronic kidney disease   . COPD (chronic obstructive pulmonary disease) ([place])   . Coronary artery disease   . GERD (gastroesophageal reflux disease)   . Multiple myeloma ([place])   . Myocardial infarction ([place]) 2005  . Neuropathy ([place])   . Shortness of breath dyspnea   . Stroke (cerebrum) ([facility])    weakness Lt hand    Social History   Social History  . Marital status: Married    Spouse name: N/A  . Number of children: N/A  . Years of education: N/A   Occupational History  . Not on file.   Social History Main Topics  . Smoking status: Current Every Day Smoker    Packs/day: 0.25    Types: Cigarettes  . Smokeless tobacco: Not on file     Comment: smokes about 3 cigarettes per week  . Alcohol use No  . Drug use: No  . Sexual activity: Not on file   Other Topics Concern  .  Not on file   Social History Narrative  . No narrative on file    Outpatient Medications Prior to Visit  Medication Sig Dispense Refill  . aspirin EC 81 MG tablet Take 81 mg by mouth daily.    . Cyanocobalamin (VITAMIN B 12 PO) Take by mouth.    . donepezil (ARICEPT) 10 MG tablet Take 1 tablet (10 mg total) by mouth at bedtime. 90 tablet 3  . furosemide (LASIX) 20 MG tablet Take 1 tablet (20 mg total) by mouth daily. 30 tablet 3  . lidocaine-prilocaine (EMLA) cream     . lovastatin (MEVACOR) 40 MG tablet TAKE 1 TABLET BY MOUTH EVERY DAY FOR HIGH CHOLESTEROL    . Multiple Vitamin (MULTI-VITAMINS) TABS Take by mouth. Reported on 04/01/2016    . omeprazole (PRILOSEC) 20 MG capsule Take 1 capsule (20 mg total) by mouth daily. TAKE  1 CAPSULE BY MOUTH EVERY DAY. 90 capsule 3   No facility-administered medications prior to visit.     No Known Allergies  Review of Systems  Constitutional: Negative for chills, fever, malaise/fatigue and weight loss.  Eyes: Negative.   Respiratory: Negative for cough, shortness of breath and wheezing.   Cardiovascular: Negative for chest pain, palpitations, orthopnea, claudication, leg swelling and PND.  Gastrointestinal: Negative.   Genitourinary: Negative.   Musculoskeletal: Positive for back pain.  Neurological: Positive for loss of consciousness (passed out this morning). Negative for dizziness, weakness and headaches.  Endo/Heme/Allergies: Negative.   Psychiatric/Behavioral: Negative.     MMSE 23/28   Objective:  BP (!) 78/20   Pulse 80   Temp 97.8 F (36.6 C) (Oral)   Wt 188 lb (85.3 kg)   BMI 23.50 kg/m   Physical Exam  Constitutional: He is oriented to person, place, and time and well-developed, well-nourished, and in no distress.  HENT:  Head: Normocephalic and atraumatic.  Right Ear: External ear normal.  Left Ear: External ear normal.  Eyes: Conjunctivae are normal. Pupils are equal, round, and reactive to light. No scleral icterus.  Neck: Normal range of motion. Neck supple.  Cardiovascular: Normal rate, regular rhythm and normal heart sounds.   Pulmonary/Chest: Effort normal and breath sounds normal.  II/VI Holo systolic murmur across the chest  Abdominal: Soft.  Musculoskeletal:  Pain over the right SI joint  Neurological: He is alert and oriented to person, place, and time.  Skin: Skin is warm and dry.  Psychiatric: Mood and affect normal.    Assessment and Plan :   1. Hypotension, unspecified hypotension type Patient actually stands up and walks easily out of the room.He is not having symptomatic hypotension. Discontinue Lasix at this time with a very low blood pressure today. Again, patient is comfortable and is in no distress  in the room. He is  able to move about easily without any orthostatic symptoms. He is a chronically ill man so we will follow him up clinically for this. The patient is comfortable but more importantly his wife is comfortable with this plan. - EKG 12-Lead 2. End-stage renal disease secondary to hypertensive nephropathy Patient misses dialysis at least once a week. I have encouraged him to keep all of his appointments for this to maintain some stability 3. Multiple myeloma 4. CAD All risk factors treated 5. Osteoarthritis/chronic pain 6. Atrial fibrillation 7. Cognitive impairment/early Alzheimer's disease Patient still lives at home with his wife. Follow clinically I have done the exam and reviewed the above chart and it is accurate to the best of  my knowledge.  Patient was seen and examined by Dr. Delfino Lovett L. [place] Mon and the note was scribed by Althea Charon, RMA.   Miguel Aschoff MD [facility] 07/08/2016 2:15 PM

## 2016-07-09 DIAGNOSIS — D37030 Neoplasm of uncertain behavior of the parotid salivary glands: Secondary | ICD-10-CM | POA: Diagnosis not present

## 2016-07-09 DIAGNOSIS — H6123 Impacted cerumen, bilateral: Secondary | ICD-10-CM | POA: Diagnosis not present

## 2016-07-10 DIAGNOSIS — N186 End stage renal disease: Secondary | ICD-10-CM | POA: Diagnosis not present

## 2016-07-10 DIAGNOSIS — D509 Iron deficiency anemia, unspecified: Secondary | ICD-10-CM | POA: Diagnosis not present

## 2016-07-10 DIAGNOSIS — D689 Coagulation defect, unspecified: Secondary | ICD-10-CM | POA: Diagnosis not present

## 2016-07-10 DIAGNOSIS — D631 Anemia in chronic kidney disease: Secondary | ICD-10-CM | POA: Diagnosis not present

## 2016-07-11 DIAGNOSIS — N186 End stage renal disease: Secondary | ICD-10-CM | POA: Diagnosis not present

## 2016-07-11 DIAGNOSIS — I120 Hypertensive chronic kidney disease with stage 5 chronic kidney disease or end stage renal disease: Secondary | ICD-10-CM | POA: Diagnosis not present

## 2016-07-11 DIAGNOSIS — Z992 Dependence on renal dialysis: Secondary | ICD-10-CM | POA: Diagnosis not present

## 2016-07-12 DIAGNOSIS — D509 Iron deficiency anemia, unspecified: Secondary | ICD-10-CM | POA: Diagnosis not present

## 2016-07-12 DIAGNOSIS — D689 Coagulation defect, unspecified: Secondary | ICD-10-CM | POA: Diagnosis not present

## 2016-07-12 DIAGNOSIS — R52 Pain, unspecified: Secondary | ICD-10-CM | POA: Diagnosis not present

## 2016-07-12 DIAGNOSIS — D631 Anemia in chronic kidney disease: Secondary | ICD-10-CM | POA: Diagnosis not present

## 2016-07-12 DIAGNOSIS — Z23 Encounter for immunization: Secondary | ICD-10-CM | POA: Diagnosis not present

## 2016-07-12 DIAGNOSIS — N186 End stage renal disease: Secondary | ICD-10-CM | POA: Diagnosis not present

## 2016-07-15 DIAGNOSIS — D689 Coagulation defect, unspecified: Secondary | ICD-10-CM | POA: Diagnosis not present

## 2016-07-15 DIAGNOSIS — D631 Anemia in chronic kidney disease: Secondary | ICD-10-CM | POA: Diagnosis not present

## 2016-07-15 DIAGNOSIS — Z23 Encounter for immunization: Secondary | ICD-10-CM | POA: Diagnosis not present

## 2016-07-15 DIAGNOSIS — N186 End stage renal disease: Secondary | ICD-10-CM | POA: Diagnosis not present

## 2016-07-15 DIAGNOSIS — R52 Pain, unspecified: Secondary | ICD-10-CM | POA: Diagnosis not present

## 2016-07-15 DIAGNOSIS — D509 Iron deficiency anemia, unspecified: Secondary | ICD-10-CM | POA: Diagnosis not present

## 2016-07-16 DIAGNOSIS — C44329 Squamous cell carcinoma of skin of other parts of face: Secondary | ICD-10-CM | POA: Diagnosis not present

## 2016-07-16 DIAGNOSIS — L814 Other melanin hyperpigmentation: Secondary | ICD-10-CM | POA: Diagnosis not present

## 2016-07-16 DIAGNOSIS — L578 Other skin changes due to chronic exposure to nonionizing radiation: Secondary | ICD-10-CM | POA: Diagnosis not present

## 2016-07-16 DIAGNOSIS — L821 Other seborrheic keratosis: Secondary | ICD-10-CM | POA: Diagnosis not present

## 2016-07-17 DIAGNOSIS — N186 End stage renal disease: Secondary | ICD-10-CM | POA: Diagnosis not present

## 2016-07-17 DIAGNOSIS — R52 Pain, unspecified: Secondary | ICD-10-CM | POA: Diagnosis not present

## 2016-07-17 DIAGNOSIS — D509 Iron deficiency anemia, unspecified: Secondary | ICD-10-CM | POA: Diagnosis not present

## 2016-07-17 DIAGNOSIS — D689 Coagulation defect, unspecified: Secondary | ICD-10-CM | POA: Diagnosis not present

## 2016-07-17 DIAGNOSIS — D631 Anemia in chronic kidney disease: Secondary | ICD-10-CM | POA: Diagnosis not present

## 2016-07-17 DIAGNOSIS — Z23 Encounter for immunization: Secondary | ICD-10-CM | POA: Diagnosis not present

## 2016-07-21 DIAGNOSIS — D689 Coagulation defect, unspecified: Secondary | ICD-10-CM | POA: Diagnosis not present

## 2016-07-21 DIAGNOSIS — D509 Iron deficiency anemia, unspecified: Secondary | ICD-10-CM | POA: Diagnosis not present

## 2016-07-21 DIAGNOSIS — N186 End stage renal disease: Secondary | ICD-10-CM | POA: Diagnosis not present

## 2016-07-21 DIAGNOSIS — D631 Anemia in chronic kidney disease: Secondary | ICD-10-CM | POA: Diagnosis not present

## 2016-07-21 DIAGNOSIS — Z23 Encounter for immunization: Secondary | ICD-10-CM | POA: Diagnosis not present

## 2016-07-21 DIAGNOSIS — R52 Pain, unspecified: Secondary | ICD-10-CM | POA: Diagnosis not present

## 2016-07-24 DIAGNOSIS — D509 Iron deficiency anemia, unspecified: Secondary | ICD-10-CM | POA: Diagnosis not present

## 2016-07-24 DIAGNOSIS — Z23 Encounter for immunization: Secondary | ICD-10-CM | POA: Diagnosis not present

## 2016-07-24 DIAGNOSIS — N186 End stage renal disease: Secondary | ICD-10-CM | POA: Diagnosis not present

## 2016-07-24 DIAGNOSIS — D689 Coagulation defect, unspecified: Secondary | ICD-10-CM | POA: Diagnosis not present

## 2016-07-24 DIAGNOSIS — D631 Anemia in chronic kidney disease: Secondary | ICD-10-CM | POA: Diagnosis not present

## 2016-07-24 DIAGNOSIS — R52 Pain, unspecified: Secondary | ICD-10-CM | POA: Diagnosis not present

## 2016-07-26 DIAGNOSIS — D689 Coagulation defect, unspecified: Secondary | ICD-10-CM | POA: Diagnosis not present

## 2016-07-26 DIAGNOSIS — R52 Pain, unspecified: Secondary | ICD-10-CM | POA: Diagnosis not present

## 2016-07-26 DIAGNOSIS — Z23 Encounter for immunization: Secondary | ICD-10-CM | POA: Diagnosis not present

## 2016-07-26 DIAGNOSIS — D509 Iron deficiency anemia, unspecified: Secondary | ICD-10-CM | POA: Diagnosis not present

## 2016-07-26 DIAGNOSIS — N186 End stage renal disease: Secondary | ICD-10-CM | POA: Diagnosis not present

## 2016-07-26 DIAGNOSIS — D631 Anemia in chronic kidney disease: Secondary | ICD-10-CM | POA: Diagnosis not present

## 2016-07-29 DIAGNOSIS — N186 End stage renal disease: Secondary | ICD-10-CM | POA: Diagnosis not present

## 2016-07-29 DIAGNOSIS — D689 Coagulation defect, unspecified: Secondary | ICD-10-CM | POA: Diagnosis not present

## 2016-07-29 DIAGNOSIS — D631 Anemia in chronic kidney disease: Secondary | ICD-10-CM | POA: Diagnosis not present

## 2016-07-29 DIAGNOSIS — R52 Pain, unspecified: Secondary | ICD-10-CM | POA: Diagnosis not present

## 2016-07-29 DIAGNOSIS — D509 Iron deficiency anemia, unspecified: Secondary | ICD-10-CM | POA: Diagnosis not present

## 2016-07-29 DIAGNOSIS — Z23 Encounter for immunization: Secondary | ICD-10-CM | POA: Diagnosis not present

## 2016-07-31 DIAGNOSIS — D689 Coagulation defect, unspecified: Secondary | ICD-10-CM | POA: Diagnosis not present

## 2016-07-31 DIAGNOSIS — D509 Iron deficiency anemia, unspecified: Secondary | ICD-10-CM | POA: Diagnosis not present

## 2016-07-31 DIAGNOSIS — D631 Anemia in chronic kidney disease: Secondary | ICD-10-CM | POA: Diagnosis not present

## 2016-07-31 DIAGNOSIS — N186 End stage renal disease: Secondary | ICD-10-CM | POA: Diagnosis not present

## 2016-07-31 DIAGNOSIS — R52 Pain, unspecified: Secondary | ICD-10-CM | POA: Diagnosis not present

## 2016-07-31 DIAGNOSIS — Z23 Encounter for immunization: Secondary | ICD-10-CM | POA: Diagnosis not present

## 2016-08-02 ENCOUNTER — Ambulatory Visit (INDEPENDENT_AMBULATORY_CARE_PROVIDER_SITE_OTHER): Payer: Medicare Other | Admitting: *Deleted

## 2016-08-02 DIAGNOSIS — E539 Vitamin B deficiency, unspecified: Secondary | ICD-10-CM | POA: Diagnosis not present

## 2016-08-02 DIAGNOSIS — N186 End stage renal disease: Secondary | ICD-10-CM | POA: Diagnosis not present

## 2016-08-02 DIAGNOSIS — Z23 Encounter for immunization: Secondary | ICD-10-CM | POA: Diagnosis not present

## 2016-08-02 DIAGNOSIS — D509 Iron deficiency anemia, unspecified: Secondary | ICD-10-CM | POA: Diagnosis not present

## 2016-08-02 DIAGNOSIS — D689 Coagulation defect, unspecified: Secondary | ICD-10-CM | POA: Diagnosis not present

## 2016-08-02 DIAGNOSIS — D631 Anemia in chronic kidney disease: Secondary | ICD-10-CM | POA: Diagnosis not present

## 2016-08-02 DIAGNOSIS — R52 Pain, unspecified: Secondary | ICD-10-CM | POA: Diagnosis not present

## 2016-08-02 MED ORDER — CYANOCOBALAMIN 1000 MCG/ML IJ SOLN
1000.0000 ug | Freq: Once | INTRAMUSCULAR | Status: AC
  Administered 2016-08-02: 1000 ug via INTRAMUSCULAR

## 2016-08-05 DIAGNOSIS — D509 Iron deficiency anemia, unspecified: Secondary | ICD-10-CM | POA: Diagnosis not present

## 2016-08-05 DIAGNOSIS — D631 Anemia in chronic kidney disease: Secondary | ICD-10-CM | POA: Diagnosis not present

## 2016-08-05 DIAGNOSIS — N186 End stage renal disease: Secondary | ICD-10-CM | POA: Diagnosis not present

## 2016-08-05 DIAGNOSIS — Z23 Encounter for immunization: Secondary | ICD-10-CM | POA: Diagnosis not present

## 2016-08-05 DIAGNOSIS — D689 Coagulation defect, unspecified: Secondary | ICD-10-CM | POA: Diagnosis not present

## 2016-08-05 DIAGNOSIS — R52 Pain, unspecified: Secondary | ICD-10-CM | POA: Diagnosis not present

## 2016-08-07 DIAGNOSIS — D631 Anemia in chronic kidney disease: Secondary | ICD-10-CM | POA: Diagnosis not present

## 2016-08-07 DIAGNOSIS — D689 Coagulation defect, unspecified: Secondary | ICD-10-CM | POA: Diagnosis not present

## 2016-08-07 DIAGNOSIS — Z23 Encounter for immunization: Secondary | ICD-10-CM | POA: Diagnosis not present

## 2016-08-07 DIAGNOSIS — R52 Pain, unspecified: Secondary | ICD-10-CM | POA: Diagnosis not present

## 2016-08-07 DIAGNOSIS — D509 Iron deficiency anemia, unspecified: Secondary | ICD-10-CM | POA: Diagnosis not present

## 2016-08-07 DIAGNOSIS — N186 End stage renal disease: Secondary | ICD-10-CM | POA: Diagnosis not present

## 2016-08-09 DIAGNOSIS — Z23 Encounter for immunization: Secondary | ICD-10-CM | POA: Diagnosis not present

## 2016-08-09 DIAGNOSIS — D689 Coagulation defect, unspecified: Secondary | ICD-10-CM | POA: Diagnosis not present

## 2016-08-09 DIAGNOSIS — N186 End stage renal disease: Secondary | ICD-10-CM | POA: Diagnosis not present

## 2016-08-09 DIAGNOSIS — R52 Pain, unspecified: Secondary | ICD-10-CM | POA: Diagnosis not present

## 2016-08-09 DIAGNOSIS — D631 Anemia in chronic kidney disease: Secondary | ICD-10-CM | POA: Diagnosis not present

## 2016-08-09 DIAGNOSIS — D509 Iron deficiency anemia, unspecified: Secondary | ICD-10-CM | POA: Diagnosis not present

## 2016-08-10 DIAGNOSIS — N186 End stage renal disease: Secondary | ICD-10-CM | POA: Diagnosis not present

## 2016-08-10 DIAGNOSIS — I120 Hypertensive chronic kidney disease with stage 5 chronic kidney disease or end stage renal disease: Secondary | ICD-10-CM | POA: Diagnosis not present

## 2016-08-10 DIAGNOSIS — Z992 Dependence on renal dialysis: Secondary | ICD-10-CM | POA: Diagnosis not present

## 2016-08-12 DIAGNOSIS — Z23 Encounter for immunization: Secondary | ICD-10-CM | POA: Diagnosis not present

## 2016-08-12 DIAGNOSIS — D631 Anemia in chronic kidney disease: Secondary | ICD-10-CM | POA: Diagnosis not present

## 2016-08-12 DIAGNOSIS — D689 Coagulation defect, unspecified: Secondary | ICD-10-CM | POA: Diagnosis not present

## 2016-08-12 DIAGNOSIS — N186 End stage renal disease: Secondary | ICD-10-CM | POA: Diagnosis not present

## 2016-08-12 DIAGNOSIS — D509 Iron deficiency anemia, unspecified: Secondary | ICD-10-CM | POA: Diagnosis not present

## 2016-08-14 DIAGNOSIS — D509 Iron deficiency anemia, unspecified: Secondary | ICD-10-CM | POA: Diagnosis not present

## 2016-08-14 DIAGNOSIS — D689 Coagulation defect, unspecified: Secondary | ICD-10-CM | POA: Diagnosis not present

## 2016-08-14 DIAGNOSIS — N186 End stage renal disease: Secondary | ICD-10-CM | POA: Diagnosis not present

## 2016-08-14 DIAGNOSIS — D631 Anemia in chronic kidney disease: Secondary | ICD-10-CM | POA: Diagnosis not present

## 2016-08-14 DIAGNOSIS — Z23 Encounter for immunization: Secondary | ICD-10-CM | POA: Diagnosis not present

## 2016-08-19 DIAGNOSIS — D509 Iron deficiency anemia, unspecified: Secondary | ICD-10-CM | POA: Diagnosis not present

## 2016-08-19 DIAGNOSIS — D689 Coagulation defect, unspecified: Secondary | ICD-10-CM | POA: Diagnosis not present

## 2016-08-19 DIAGNOSIS — D631 Anemia in chronic kidney disease: Secondary | ICD-10-CM | POA: Diagnosis not present

## 2016-08-19 DIAGNOSIS — N186 End stage renal disease: Secondary | ICD-10-CM | POA: Diagnosis not present

## 2016-08-19 DIAGNOSIS — Z23 Encounter for immunization: Secondary | ICD-10-CM | POA: Diagnosis not present

## 2016-08-20 ENCOUNTER — Ambulatory Visit
Admission: RE | Admit: 2016-08-20 | Discharge: 2016-08-20 | Disposition: A | Discharge status: Home / Self Care | Payer: Medicare Other | Source: Ambulatory Visit | Attending: Hematology and Oncology | Admitting: Hematology and Oncology

## 2016-08-20 DIAGNOSIS — R918 Other nonspecific abnormal finding of lung field: Secondary | ICD-10-CM | POA: Diagnosis not present

## 2016-08-20 DIAGNOSIS — I700 Atherosclerosis of aorta: Secondary | ICD-10-CM | POA: Insufficient documentation

## 2016-08-20 DIAGNOSIS — I2510 Atherosclerotic heart disease of native coronary artery without angina pectoris: Secondary | ICD-10-CM | POA: Diagnosis not present

## 2016-08-20 DIAGNOSIS — R911 Solitary pulmonary nodule: Secondary | ICD-10-CM | POA: Insufficient documentation

## 2016-08-21 ENCOUNTER — Inpatient Hospital Stay: Payer: Medicare Other | Attending: Hematology and Oncology | Admitting: Hematology and Oncology

## 2016-08-21 ENCOUNTER — Inpatient Hospital Stay: Payer: Medicare Other

## 2016-08-21 VITALS — BP 92/56 | HR 84 | Temp 96.6°F | Resp 18 | Wt 187.2 lb

## 2016-08-21 DIAGNOSIS — Z794 Long term (current) use of insulin: Secondary | ICD-10-CM

## 2016-08-21 DIAGNOSIS — R911 Solitary pulmonary nodule: Secondary | ICD-10-CM

## 2016-08-21 DIAGNOSIS — J449 Chronic obstructive pulmonary disease, unspecified: Secondary | ICD-10-CM | POA: Diagnosis not present

## 2016-08-21 DIAGNOSIS — Z79899 Other long term (current) drug therapy: Secondary | ICD-10-CM

## 2016-08-21 DIAGNOSIS — D472 Monoclonal gammopathy: Secondary | ICD-10-CM | POA: Insufficient documentation

## 2016-08-21 DIAGNOSIS — Z7982 Long term (current) use of aspirin: Secondary | ICD-10-CM | POA: Diagnosis not present

## 2016-08-21 DIAGNOSIS — Z8673 Personal history of transient ischemic attack (TIA), and cerebral infarction without residual deficits: Secondary | ICD-10-CM | POA: Insufficient documentation

## 2016-08-21 DIAGNOSIS — D631 Anemia in chronic kidney disease: Secondary | ICD-10-CM | POA: Diagnosis not present

## 2016-08-21 DIAGNOSIS — Z888 Allergy status to other drugs, medicaments and biological substances status: Secondary | ICD-10-CM | POA: Diagnosis not present

## 2016-08-21 DIAGNOSIS — F17210 Nicotine dependence, cigarettes, uncomplicated: Secondary | ICD-10-CM | POA: Insufficient documentation

## 2016-08-21 DIAGNOSIS — E538 Deficiency of other specified B group vitamins: Secondary | ICD-10-CM | POA: Diagnosis not present

## 2016-08-21 DIAGNOSIS — C9000 Multiple myeloma not having achieved remission: Secondary | ICD-10-CM | POA: Insufficient documentation

## 2016-08-21 DIAGNOSIS — K219 Gastro-esophageal reflux disease without esophagitis: Secondary | ICD-10-CM | POA: Diagnosis not present

## 2016-08-21 DIAGNOSIS — D689 Coagulation defect, unspecified: Secondary | ICD-10-CM | POA: Diagnosis not present

## 2016-08-21 DIAGNOSIS — Z992 Dependence on renal dialysis: Secondary | ICD-10-CM | POA: Diagnosis not present

## 2016-08-21 DIAGNOSIS — I2510 Atherosclerotic heart disease of native coronary artery without angina pectoris: Secondary | ICD-10-CM | POA: Insufficient documentation

## 2016-08-21 DIAGNOSIS — G8929 Other chronic pain: Secondary | ICD-10-CM | POA: Insufficient documentation

## 2016-08-21 DIAGNOSIS — M549 Dorsalgia, unspecified: Secondary | ICD-10-CM | POA: Diagnosis not present

## 2016-08-21 DIAGNOSIS — N186 End stage renal disease: Secondary | ICD-10-CM

## 2016-08-21 DIAGNOSIS — R918 Other nonspecific abnormal finding of lung field: Secondary | ICD-10-CM | POA: Insufficient documentation

## 2016-08-21 DIAGNOSIS — M1990 Unspecified osteoarthritis, unspecified site: Secondary | ICD-10-CM | POA: Diagnosis not present

## 2016-08-21 DIAGNOSIS — Z800 Family history of malignant neoplasm of digestive organs: Secondary | ICD-10-CM | POA: Insufficient documentation

## 2016-08-21 DIAGNOSIS — I252 Old myocardial infarction: Secondary | ICD-10-CM | POA: Insufficient documentation

## 2016-08-21 DIAGNOSIS — Z23 Encounter for immunization: Secondary | ICD-10-CM | POA: Diagnosis not present

## 2016-08-21 DIAGNOSIS — D509 Iron deficiency anemia, unspecified: Secondary | ICD-10-CM | POA: Diagnosis not present

## 2016-08-21 LAB — CBC WITH DIFFERENTIAL/PLATELET
Basophils Absolute: 0 10*3/uL (ref 0–0.1)
Basophils Relative: 1 %
Eosinophils Absolute: 0.6 10*3/uL (ref 0–0.7)
Eosinophils Relative: 12 %
HCT: 34.5 % — ABNORMAL LOW (ref 40.0–52.0)
Hemoglobin: 11.4 g/dL — ABNORMAL LOW (ref 13.0–18.0)
Lymphocytes Relative: 25 %
Lymphs Abs: 1.2 10*3/uL (ref 1.0–3.6)
MCH: 31.7 pg (ref 26.0–34.0)
MCHC: 32.9 g/dL (ref 32.0–36.0)
MCV: 96.2 fL (ref 80.0–100.0)
Monocytes Absolute: 0.3 10*3/uL (ref 0.2–1.0)
Monocytes Relative: 6 %
Neutro Abs: 2.8 10*3/uL (ref 1.4–6.5)
Neutrophils Relative %: 56 %
Platelets: 109 10*3/uL — ABNORMAL LOW (ref 150–440)
RBC: 3.59 MIL/uL — ABNORMAL LOW (ref 4.40–5.90)
RDW: 15.7 % — ABNORMAL HIGH (ref 11.5–14.5)
WBC: 4.9 10*3/uL (ref 3.8–10.6)

## 2016-08-21 NOTE — Progress Notes (Signed)
[facility] day:  08/21/2016  Chief Complaint: Frank Huang is a 82 y.o. male with smoldering multiple myeloma who is seen for 3 month assessment and review of interval chest CT.  HPI:  The patient was last seen in the medical oncology clinic on 05/17/2016.  At that time, PET scan was reviewed.  PET scan revealed no evidence of active skeletal multiple myeloma or plasmacytoma.  The LEFT lower lobe pulmonary nodule was 9 mm and had mild metabolic activity (SUV 1.5)  There was a hypermetabolic dense nodule along the medial surface of the RIGHT parotid gland most consistent with a primary parotid neoplasm (benign or malignant).   A 3 month follow-up chest CT was ordered.  He was referred to ENT.  He underwent attempted ultrasound-guided biopsy on 06/18/2016 of the 1.1 cm hypoechoic lesion in the right parotid gland. Despite multiple attempts under ultrasound, given its deep location, difficult angle of approach, and small size, attempts were unsuccessful.   He was seen by Dr. Carloyn Manner on 06/24/2016.  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 Resection was felt to be a risk the facial nerve.  Plan was for repeat ultrasound or MRI in 6 months.  Chest CT on 08/20/2016 revealed interval resolution of bilateral lower lobe nodules c/w inflammatory or infectious process.  Symptomatically, he denies any complaints. He states "I am fine".   Past Medical History:  Diagnosis Date  . Cancer ([place])   . Chronic kidney disease   . COPD (chronic obstructive pulmonary disease) ([place])   . Coronary artery disease   . GERD (gastroesophageal reflux disease)   . Multiple myeloma ([place])   . Myocardial infarction 2005  . Neuropathy ([place])   . Shortness of breath dyspnea   . Stroke (cerebrum) ([facility])    weakness Lt hand    Past Surgical History:  Procedure Laterality Date  . APPENDECTOMY    . AV FISTULA PLACEMENT Left   . CHOLECYSTECTOMY    . SHOULDER  ARTHROSCOPY WITH OPEN ROTATOR CUFF REPAIR Left 09/19/2015   Procedure: SHOULDER ARTHROSCOPY , subacromial decompression, debridement;  Surgeon: Corky Mull, MD;  Location: [facility] ORS;  Service: Orthopedics;  Laterality: Left;  . TOTAL HIP ARTHROPLASTY Left     Family History  Problem Relation Age of Onset  . Alzheimer's disease Mother   . Kidney failure Father   . Bone cancer Sister   . Stomach cancer Sister     Social History:  reports that he has been smoking Cigarettes.  He has been smoking about 0.25 packs per day. He does not have any smokeless tobacco history on file. He reports that he does not drink alcohol or use drugs.  He began smoking at age 84. He smokes 20 packs per month (2 cartons). He has a 30-40 pack year smoking history.  The patient is alone today.  Allergies: No Known Allergies  Current Medications: Current Outpatient Prescriptions  Medication Sig Dispense Refill  . aspirin EC 81 MG tablet Take 81 mg by mouth daily.    . Cyanocobalamin (VITAMIN B 12 PO) Take by mouth.    . donepezil (ARICEPT) 10 MG tablet Take 1 tablet (10 mg total) by mouth at bedtime. 90 tablet 3  . lidocaine-prilocaine (EMLA) cream     . lovastatin (MEVACOR) 40 MG tablet TAKE 1 TABLET BY MOUTH EVERY DAY FOR HIGH CHOLESTEROL    . Multiple Vitamin (MULTI-VITAMINS) TABS Take by mouth. Reported on  04/01/2016    . omeprazole (PRILOSEC) 20 MG capsule Take 1 capsule (20 mg total) by mouth daily. TAKE 1 CAPSULE BY MOUTH EVERY DAY. 90 capsule 3   No current facility-administered medications for this visit.     Review of Systems:  GENERAL:  Feels "fine".  No fevers or sweats.  Weight stable. PERFORMANCE STATUS (ECOG):  1 HEENT:  No visual changes, runny nose, sore throat, mouth sores or tenderness. Lungs: No shortness of breath or cough.  No hemoptysis. Cardiac:  No chest pain, palpitations, orthopnea, or PND. GI:  No nausea, vomiting, diarrhea, constipation, melena or hematochezia. GU:  Dialysis  every MWF.  No urgency, frequency, dysuria, or hematuria. Musculoskeletal:  Left hip discomfort s/p replacement.  Back pain (chronic).  Left shoulder discomfort.  No muscle tenderness. Extremities:  No pain or swelling. Skin:  Bruises easily on baby aspirin.  No rashes or skin changes. Neuro:  Left foot numbness. No headache,weakness, balance or coordination issues. Endocrine:  No diabetes, thyroid issues, hot flashes or night sweats. Psych:  No mood changes, depression or anxiety. Pain:  No focal pain. Review of systems:  All other systems reviewed and found to be negative.  Physical Exam: Blood pressure (!) 92/56, pulse 84, temperature (!) 96.6 F (35.9 C), temperature source Tympanic, resp. rate 18, weight 187 lb 2.7 oz (84.9 kg). GENERAL:  Well developed, well nourished, gentleman sitting comfortably in the exam room in no acute distress. MENTAL STATUS:  Alert and oriented to person, place and time. HEAD:  Thin gray hair.  Male pattern baldness.  Normocephalic, atraumatic, face symmetric, no Cushingoid features. EYES:  Silver rimmed glasses.  Blue eyes.  Pupils equal round and reactive to light and accomodation.  No conjunctivitis or scleral icterus. ENT:  Oropharynx clear without lesion.  Tongue normal. Mucous membranes moist.  RESPIRATORY:  Clear to auscultation without rales, wheezes or rhonchi. CARDIOVASCULAR:  Regular rate and rhythm without murmur, rub or gallop. ABDOMEN:  Soft, non-tender, with active bowel sounds, and no hepatosplenomegaly.  No masses. SKIN:  Ecchymosis on arms.  Multiple moles.  No rashes, ulcers or lesions. EXTREMITIES: Chronic bilateral lower extremity edema.  No skin discoloration.  No palpable cords. LYMPH NODES:  No palpable cervical, supraclavicular, axillary or inguinal adenopathy  NEUROLOGICAL: Unremarkable. PSYCH:  Appropriate.   Office Visit on 08/21/2016  Component Date Value Ref Range Status  . WBC 08/21/2016 4.9  3.8 - 10.6 K/uL Final  . RBC  08/21/2016 3.59* 4.40 - 5.90 MIL/uL Final  . Hemoglobin 08/21/2016 11.4* 13.0 - 18.0 g/dL Final  . HCT 08/21/2016 34.5* 40.0 - 52.0 % Final  . MCV 08/21/2016 96.2  80.0 - 100.0 fL Final  . MCH 08/21/2016 31.7  26.0 - 34.0 pg Final  . MCHC 08/21/2016 32.9  32.0 - 36.0 g/dL Final  . RDW 08/21/2016 15.7* 11.5 - 14.5 % Final  . Platelets 08/21/2016 109* 150 - 440 K/uL Final  . Neutrophils Relative % 08/21/2016 56  % Final  . Neutro Abs 08/21/2016 2.8  1.4 - 6.5 K/uL Final  . Lymphocytes Relative 08/21/2016 25  % Final  . Lymphs Abs 08/21/2016 1.2  1.0 - 3.6 K/uL Final  . Monocytes Relative 08/21/2016 6  % Final  . Monocytes Absolute 08/21/2016 0.3  0.2 - 1.0 K/uL Final  . Eosinophils Relative 08/21/2016 12  % Final  . Eosinophils Absolute 08/21/2016 0.6  0 - 0.7 K/uL Final  . Basophils Relative 08/21/2016 1  % Final  . Basophils Absolute  08/21/2016 0.0  0 - 0.1 K/uL Final  . Total Protein ELP 08/22/2016 7.2  6.0 - 8.5 g/dL Final  . Albumin ELP 08/22/2016 4.2  2.9 - 4.4 g/dL Final  . Alpha-1-Globulin 08/22/2016 0.2  0.0 - 0.4 g/dL Final  . Alpha-2-Globulin 08/22/2016 0.7  0.4 - 1.0 g/dL Final  . Beta Globulin 08/22/2016 1.7* 0.7 - 1.3 g/dL Final  . Gamma Globulin 08/22/2016 0.4  0.4 - 1.8 g/dL Final  . M-Spike, % 08/22/2016 0.6* Not Observed g/dL Final  . SPE Interp. 08/22/2016 Comment   Final   Comment: (NOTE) The SPE pattern demonstrates a single peak (M-spike) in the beta region which may represent monoclonal protein. This peak may also be caused by the presence of fibrinogen or circulating immune complexes. If clinically indicated, the presence of a monoclonal gammopathy may be confirmed by immunofixation, as well as evaluation of the urine for the presence of Bence-Jones protein. Performed At: [facility] [place], [place] [id number] Lindon Romp MD NL:[id number]   . Comment 08/22/2016 Comment   Final   Comment: (NOTE) Protein electrophoresis scan  will follow via computer, mail, or courier delivery.   Marland Kitchen GLOBULIN, TOTAL 08/22/2016 3.0  2.2 - 3.9 g/dL Corrected  . A/G Ratio 08/22/2016 1.4  0.7 - 1.7 Corrected  . Kappa free light chain 08/22/2016 834.9* 3.3 - 19.4 mg/L Final  . Lamda free light chains 08/22/2016 43.5* 5.7 - 26.3 mg/L Final  . Kappa, lamda light chain ratio 08/22/2016 19.19* 0.26 - 1.65 Final   Comment: (NOTE) Performed At: [facility] [address] [place], [place] [id number] Lindon Romp MD UD:[id number]     Assessment:  DARYON REMMERT is a 82 y.o. 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  Bone marrow revealed 20% plasma cells. He was treated with thalidomide for 7 months then discontinued in 06/2005 secondary to rash and depression. He was treated briefly with Revlimid in 2013 which was also discontinued secondary to rash.    M spike was 0.5 gm/dL on 06/15/2013, 0.8 gm/dL on 01/19/2014, 0.5 gm/dL on 11/22/2014, 0.9 on 06/28/2015, 0.7 on 09/20/2015, 0.6 on 01/02/2016, 0.8 on 04/16/2016, and 0.6 on 08/21/2016.  Kappa free light chains were 512 in 06/15/2013, 686 11/02/2013, 929 on 01/19/2014, 1212 in 05/10/2014, 1409 on 11/22/2014, 1119 on 02/28/2015, 851 (ratio 63.84) on 06/28/2015, 1006 (ratio 74.50) on 09/20/2015, 1133 (ratio 59.94) on 01/02/2016, 785.6 (ratio 24.32) on 04/16/2016, 834.9 (ratio 19.19) on 08/21/2016.  He has subsequently been followed off therapy.  He had a hip fracture in in 12/2013.  Per notes, pathology revealed no myeloma.  Bone survey on 01/17/2015 revealed a 9 mm lytic lesion in the frontoparietal region (previously 7 mm).   He has end-stage renal disease unrelated to his myeloma.  He has been on dialysis since 11/2013 (MWF).   He has anemia due to chronic renal insufficiency.  He has a history of iron deficiency. He had a negative colonoscopy and upper endoscopy in 08/2004. Notes indicate he also had a small bowel capsule study.  EGD and colonoscopy in 2014 revealed gastric lesions which were cauterized. He receives IV iron and Procrit with dialysis.  Ferritin is elevated (? acute phase reactant as ESR elevated).  Ferritin was 1121 on 07/05/2015 and 743 on 01/02/2016.  Iron saturation was 30% on 07/05/2015.  He has B12 deficiency.  B12 was 120 on 07/05/2015.  Folate was 10.8.  He is on  B12 IM monthy (last on 08/02/2016 with primary care).   He has a history of left shoulder discomfort.  MRI on 06/14/2015 revealed a tendinopathy, osteoarthritis, and capsulitis.   Abdomen and pelvic CT scan on 04/30/2016 revealed a 1.3 cm irregularly marginated nodular lesion in the anterior left lower lobe. There was a subtle area of slightly diminished attenuation near the tail of the pancreas of uncertain significance. There were multiple renal lesions (some did not represent simple cysts).   Chest CT on 05/03/2016 revealed a 1.05 cm ill-defined slightly spiculated irregular nodule in the left lower lobe as well as a 0.8 cm nodule in the right lower lobe.  PET scan on 05/16/2016 revealed no evidence of active skeletal multiple myeloma or plasmacytoma.  The LEFT lower lobe pulmonary nodule was 9 mm and had mild metabolic activity (SUV 1.5).  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  Chest CT on 08/20/2016 revealed interval resolution of bilateral lower lobe nodules c/w inflammatory or infectious process.  Ultrasound-guided biopsy of the right parotid nodule on 06/18/2016 was unsuccessful. He was seen by Dr. Carloyn Manner on 06/24/2016.  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  Plan was for repeat ultrasound or MRI in 6 months.  Symptomatically, he denies any complaints.  Exam is stable.  Plan: 1.  Labs today:  CBC with diff, CMP, SPEP, free light chains. 2.  Discuss interval chest CT.  Nodules have  resolved.  No evidence of malignancy.  Etiology was likely inflammatory. 3.  Discuss interval assessment by ENT and attempt at biopsy.  Discuss likely Warthin's tumor.  Plan for repeat imaging in 6 month. 4.  RTC in 3 months for MD assessment and labs(CBC with diff, SPEP, free light chains).   Lequita Asal, MD  08/21/2016

## 2016-08-22 ENCOUNTER — Ambulatory Visit: Payer: Medicare Other | Admitting: Hematology and Oncology

## 2016-08-22 LAB — PROTEIN ELECTROPHORESIS, SERUM
A/G Ratio: 1.4 (ref 0.7–1.7)
Albumin ELP: 4.2 g/dL (ref 2.9–4.4)
Alpha-1-Globulin: 0.2 g/dL (ref 0.0–0.4)
Alpha-2-Globulin: 0.7 g/dL (ref 0.4–1.0)
Beta Globulin: 1.7 g/dL — ABNORMAL HIGH (ref 0.7–1.3)
Gamma Globulin: 0.4 g/dL (ref 0.4–1.8)
Globulin, Total: 3 g/dL (ref 2.2–3.9)
M-Spike, %: 0.6 g/dL — ABNORMAL HIGH
Total Protein ELP: 7.2 g/dL (ref 6.0–8.5)

## 2016-08-22 LAB — KAPPA/LAMBDA LIGHT CHAINS
Kappa free light chain: 834.9 mg/L — ABNORMAL HIGH (ref 3.3–19.4)
Kappa, lambda light chain ratio: 19.19 — ABNORMAL HIGH (ref 0.26–1.65)
Lambda free light chains: 43.5 mg/L — ABNORMAL HIGH (ref 5.7–26.3)

## 2016-08-23 DIAGNOSIS — D689 Coagulation defect, unspecified: Secondary | ICD-10-CM | POA: Diagnosis not present

## 2016-08-23 DIAGNOSIS — D631 Anemia in chronic kidney disease: Secondary | ICD-10-CM | POA: Diagnosis not present

## 2016-08-23 DIAGNOSIS — Z23 Encounter for immunization: Secondary | ICD-10-CM | POA: Diagnosis not present

## 2016-08-23 DIAGNOSIS — N186 End stage renal disease: Secondary | ICD-10-CM | POA: Diagnosis not present

## 2016-08-23 DIAGNOSIS — D509 Iron deficiency anemia, unspecified: Secondary | ICD-10-CM | POA: Diagnosis not present

## 2016-08-26 DIAGNOSIS — Z23 Encounter for immunization: Secondary | ICD-10-CM | POA: Diagnosis not present

## 2016-08-26 DIAGNOSIS — D631 Anemia in chronic kidney disease: Secondary | ICD-10-CM | POA: Diagnosis not present

## 2016-08-26 DIAGNOSIS — D509 Iron deficiency anemia, unspecified: Secondary | ICD-10-CM | POA: Diagnosis not present

## 2016-08-26 DIAGNOSIS — D689 Coagulation defect, unspecified: Secondary | ICD-10-CM | POA: Diagnosis not present

## 2016-08-26 DIAGNOSIS — N186 End stage renal disease: Secondary | ICD-10-CM | POA: Diagnosis not present

## 2016-08-28 DIAGNOSIS — D509 Iron deficiency anemia, unspecified: Secondary | ICD-10-CM | POA: Diagnosis not present

## 2016-08-28 DIAGNOSIS — D689 Coagulation defect, unspecified: Secondary | ICD-10-CM | POA: Diagnosis not present

## 2016-08-28 DIAGNOSIS — D631 Anemia in chronic kidney disease: Secondary | ICD-10-CM | POA: Diagnosis not present

## 2016-08-28 DIAGNOSIS — Z23 Encounter for immunization: Secondary | ICD-10-CM | POA: Diagnosis not present

## 2016-08-28 DIAGNOSIS — N186 End stage renal disease: Secondary | ICD-10-CM | POA: Diagnosis not present

## 2016-08-30 ENCOUNTER — Telehealth: Payer: Self-pay | Admitting: Family Medicine

## 2016-08-30 ENCOUNTER — Ambulatory Visit (INDEPENDENT_AMBULATORY_CARE_PROVIDER_SITE_OTHER): Payer: Medicare Other

## 2016-08-30 DIAGNOSIS — D509 Iron deficiency anemia, unspecified: Secondary | ICD-10-CM | POA: Diagnosis not present

## 2016-08-30 DIAGNOSIS — Z23 Encounter for immunization: Secondary | ICD-10-CM | POA: Diagnosis not present

## 2016-08-30 DIAGNOSIS — D689 Coagulation defect, unspecified: Secondary | ICD-10-CM | POA: Diagnosis not present

## 2016-08-30 DIAGNOSIS — E538 Deficiency of other specified B group vitamins: Secondary | ICD-10-CM

## 2016-08-30 DIAGNOSIS — D631 Anemia in chronic kidney disease: Secondary | ICD-10-CM | POA: Diagnosis not present

## 2016-08-30 DIAGNOSIS — N186 End stage renal disease: Secondary | ICD-10-CM | POA: Diagnosis not present

## 2016-08-30 MED ORDER — CYANOCOBALAMIN 1000 MCG/ML IJ SOLN
1000.0000 ug | Freq: Once | INTRAMUSCULAR | Status: AC
  Administered 2016-08-30: 1000 ug via INTRAMUSCULAR

## 2016-08-30 NOTE — Telephone Encounter (Signed)
Please review-aa 

## 2016-08-30 NOTE — Telephone Encounter (Signed)
Pt's wife [name] called wanting to know if Ellard can get his next PT done on 10/08/16 when he comes in to see Dr. Rosanna Randy.  It will be about a week later than normal.  Please call [name] back at [phone].  Thanks [name]

## 2016-08-31 NOTE — Telephone Encounter (Signed)
ok 

## 2016-09-01 ENCOUNTER — Encounter: Payer: Self-pay | Admitting: Hematology and Oncology

## 2016-09-02 DIAGNOSIS — D509 Iron deficiency anemia, unspecified: Secondary | ICD-10-CM | POA: Diagnosis not present

## 2016-09-02 DIAGNOSIS — N186 End stage renal disease: Secondary | ICD-10-CM | POA: Diagnosis not present

## 2016-09-02 DIAGNOSIS — Z23 Encounter for immunization: Secondary | ICD-10-CM | POA: Diagnosis not present

## 2016-09-02 DIAGNOSIS — D631 Anemia in chronic kidney disease: Secondary | ICD-10-CM | POA: Diagnosis not present

## 2016-09-02 DIAGNOSIS — D689 Coagulation defect, unspecified: Secondary | ICD-10-CM | POA: Diagnosis not present

## 2016-09-02 NOTE — Telephone Encounter (Signed)
Wife-aa

## 2016-09-06 DIAGNOSIS — D631 Anemia in chronic kidney disease: Secondary | ICD-10-CM | POA: Diagnosis not present

## 2016-09-06 DIAGNOSIS — Z23 Encounter for immunization: Secondary | ICD-10-CM | POA: Diagnosis not present

## 2016-09-06 DIAGNOSIS — D509 Iron deficiency anemia, unspecified: Secondary | ICD-10-CM | POA: Diagnosis not present

## 2016-09-06 DIAGNOSIS — D689 Coagulation defect, unspecified: Secondary | ICD-10-CM | POA: Diagnosis not present

## 2016-09-06 DIAGNOSIS — N186 End stage renal disease: Secondary | ICD-10-CM | POA: Diagnosis not present

## 2016-09-09 DIAGNOSIS — D631 Anemia in chronic kidney disease: Secondary | ICD-10-CM | POA: Diagnosis not present

## 2016-09-09 DIAGNOSIS — Z23 Encounter for immunization: Secondary | ICD-10-CM | POA: Diagnosis not present

## 2016-09-09 DIAGNOSIS — N186 End stage renal disease: Secondary | ICD-10-CM | POA: Diagnosis not present

## 2016-09-09 DIAGNOSIS — D509 Iron deficiency anemia, unspecified: Secondary | ICD-10-CM | POA: Diagnosis not present

## 2016-09-09 DIAGNOSIS — D689 Coagulation defect, unspecified: Secondary | ICD-10-CM | POA: Diagnosis not present

## 2016-09-10 DIAGNOSIS — I120 Hypertensive chronic kidney disease with stage 5 chronic kidney disease or end stage renal disease: Secondary | ICD-10-CM | POA: Diagnosis not present

## 2016-09-10 DIAGNOSIS — N186 End stage renal disease: Secondary | ICD-10-CM | POA: Diagnosis not present

## 2016-09-10 DIAGNOSIS — Z992 Dependence on renal dialysis: Secondary | ICD-10-CM | POA: Diagnosis not present

## 2016-09-11 DIAGNOSIS — Z23 Encounter for immunization: Secondary | ICD-10-CM | POA: Diagnosis not present

## 2016-09-11 DIAGNOSIS — D689 Coagulation defect, unspecified: Secondary | ICD-10-CM | POA: Diagnosis not present

## 2016-09-11 DIAGNOSIS — N2581 Secondary hyperparathyroidism of renal origin: Secondary | ICD-10-CM | POA: Diagnosis not present

## 2016-09-11 DIAGNOSIS — D631 Anemia in chronic kidney disease: Secondary | ICD-10-CM | POA: Diagnosis not present

## 2016-09-11 DIAGNOSIS — N186 End stage renal disease: Secondary | ICD-10-CM | POA: Diagnosis not present

## 2016-09-13 DIAGNOSIS — D689 Coagulation defect, unspecified: Secondary | ICD-10-CM | POA: Diagnosis not present

## 2016-09-13 DIAGNOSIS — N186 End stage renal disease: Secondary | ICD-10-CM | POA: Diagnosis not present

## 2016-09-13 DIAGNOSIS — Z23 Encounter for immunization: Secondary | ICD-10-CM | POA: Diagnosis not present

## 2016-09-13 DIAGNOSIS — D631 Anemia in chronic kidney disease: Secondary | ICD-10-CM | POA: Diagnosis not present

## 2016-09-13 DIAGNOSIS — N2581 Secondary hyperparathyroidism of renal origin: Secondary | ICD-10-CM | POA: Diagnosis not present

## 2016-09-16 DIAGNOSIS — N186 End stage renal disease: Secondary | ICD-10-CM | POA: Diagnosis not present

## 2016-09-16 DIAGNOSIS — Z23 Encounter for immunization: Secondary | ICD-10-CM | POA: Diagnosis not present

## 2016-09-16 DIAGNOSIS — N2581 Secondary hyperparathyroidism of renal origin: Secondary | ICD-10-CM | POA: Diagnosis not present

## 2016-09-16 DIAGNOSIS — D631 Anemia in chronic kidney disease: Secondary | ICD-10-CM | POA: Diagnosis not present

## 2016-09-16 DIAGNOSIS — D689 Coagulation defect, unspecified: Secondary | ICD-10-CM | POA: Diagnosis not present

## 2016-09-17 DIAGNOSIS — I739 Peripheral vascular disease, unspecified: Secondary | ICD-10-CM | POA: Diagnosis not present

## 2016-09-17 DIAGNOSIS — G9009 Other idiopathic peripheral autonomic neuropathy: Secondary | ICD-10-CM | POA: Diagnosis not present

## 2016-09-17 DIAGNOSIS — M79674 Pain in right toe(s): Secondary | ICD-10-CM | POA: Diagnosis not present

## 2016-09-17 DIAGNOSIS — M79675 Pain in left toe(s): Secondary | ICD-10-CM | POA: Diagnosis not present

## 2016-09-17 DIAGNOSIS — B351 Tinea unguium: Secondary | ICD-10-CM | POA: Diagnosis not present

## 2016-09-18 DIAGNOSIS — D689 Coagulation defect, unspecified: Secondary | ICD-10-CM | POA: Diagnosis not present

## 2016-09-18 DIAGNOSIS — D631 Anemia in chronic kidney disease: Secondary | ICD-10-CM | POA: Diagnosis not present

## 2016-09-18 DIAGNOSIS — Z23 Encounter for immunization: Secondary | ICD-10-CM | POA: Diagnosis not present

## 2016-09-18 DIAGNOSIS — N186 End stage renal disease: Secondary | ICD-10-CM | POA: Diagnosis not present

## 2016-09-18 DIAGNOSIS — N2581 Secondary hyperparathyroidism of renal origin: Secondary | ICD-10-CM | POA: Diagnosis not present

## 2016-09-23 DIAGNOSIS — N2581 Secondary hyperparathyroidism of renal origin: Secondary | ICD-10-CM | POA: Diagnosis not present

## 2016-09-23 DIAGNOSIS — D631 Anemia in chronic kidney disease: Secondary | ICD-10-CM | POA: Diagnosis not present

## 2016-09-23 DIAGNOSIS — N186 End stage renal disease: Secondary | ICD-10-CM | POA: Diagnosis not present

## 2016-09-23 DIAGNOSIS — Z23 Encounter for immunization: Secondary | ICD-10-CM | POA: Diagnosis not present

## 2016-09-23 DIAGNOSIS — D689 Coagulation defect, unspecified: Secondary | ICD-10-CM | POA: Diagnosis not present

## 2016-09-25 DIAGNOSIS — D631 Anemia in chronic kidney disease: Secondary | ICD-10-CM | POA: Diagnosis not present

## 2016-09-25 DIAGNOSIS — N2581 Secondary hyperparathyroidism of renal origin: Secondary | ICD-10-CM | POA: Diagnosis not present

## 2016-09-25 DIAGNOSIS — D689 Coagulation defect, unspecified: Secondary | ICD-10-CM | POA: Diagnosis not present

## 2016-09-25 DIAGNOSIS — Z23 Encounter for immunization: Secondary | ICD-10-CM | POA: Diagnosis not present

## 2016-09-25 DIAGNOSIS — N186 End stage renal disease: Secondary | ICD-10-CM | POA: Diagnosis not present

## 2016-09-27 DIAGNOSIS — Z23 Encounter for immunization: Secondary | ICD-10-CM | POA: Diagnosis not present

## 2016-09-27 DIAGNOSIS — D631 Anemia in chronic kidney disease: Secondary | ICD-10-CM | POA: Diagnosis not present

## 2016-09-27 DIAGNOSIS — D689 Coagulation defect, unspecified: Secondary | ICD-10-CM | POA: Diagnosis not present

## 2016-09-27 DIAGNOSIS — N186 End stage renal disease: Secondary | ICD-10-CM | POA: Diagnosis not present

## 2016-09-27 DIAGNOSIS — N2581 Secondary hyperparathyroidism of renal origin: Secondary | ICD-10-CM | POA: Diagnosis not present

## 2016-09-29 DIAGNOSIS — D631 Anemia in chronic kidney disease: Secondary | ICD-10-CM | POA: Diagnosis not present

## 2016-09-29 DIAGNOSIS — N186 End stage renal disease: Secondary | ICD-10-CM | POA: Diagnosis not present

## 2016-09-29 DIAGNOSIS — Z23 Encounter for immunization: Secondary | ICD-10-CM | POA: Diagnosis not present

## 2016-09-29 DIAGNOSIS — N2581 Secondary hyperparathyroidism of renal origin: Secondary | ICD-10-CM | POA: Diagnosis not present

## 2016-09-29 DIAGNOSIS — D689 Coagulation defect, unspecified: Secondary | ICD-10-CM | POA: Diagnosis not present

## 2016-10-01 DIAGNOSIS — D689 Coagulation defect, unspecified: Secondary | ICD-10-CM | POA: Diagnosis not present

## 2016-10-01 DIAGNOSIS — D631 Anemia in chronic kidney disease: Secondary | ICD-10-CM | POA: Diagnosis not present

## 2016-10-01 DIAGNOSIS — Z23 Encounter for immunization: Secondary | ICD-10-CM | POA: Diagnosis not present

## 2016-10-01 DIAGNOSIS — N2581 Secondary hyperparathyroidism of renal origin: Secondary | ICD-10-CM | POA: Diagnosis not present

## 2016-10-01 DIAGNOSIS — N186 End stage renal disease: Secondary | ICD-10-CM | POA: Diagnosis not present

## 2016-10-07 DIAGNOSIS — D631 Anemia in chronic kidney disease: Secondary | ICD-10-CM | POA: Diagnosis not present

## 2016-10-07 DIAGNOSIS — N186 End stage renal disease: Secondary | ICD-10-CM | POA: Diagnosis not present

## 2016-10-07 DIAGNOSIS — D689 Coagulation defect, unspecified: Secondary | ICD-10-CM | POA: Diagnosis not present

## 2016-10-07 DIAGNOSIS — Z23 Encounter for immunization: Secondary | ICD-10-CM | POA: Diagnosis not present

## 2016-10-07 DIAGNOSIS — N2581 Secondary hyperparathyroidism of renal origin: Secondary | ICD-10-CM | POA: Diagnosis not present

## 2016-10-08 ENCOUNTER — Ambulatory Visit (INDEPENDENT_AMBULATORY_CARE_PROVIDER_SITE_OTHER): Payer: Medicare Other | Admitting: Family Medicine

## 2016-10-08 ENCOUNTER — Ambulatory Visit: Payer: Medicare Other | Admitting: Family Medicine

## 2016-10-08 VITALS — BP 98/60 | HR 76 | Temp 98.5°F | Resp 14 | Wt 193.0 lb

## 2016-10-08 DIAGNOSIS — E538 Deficiency of other specified B group vitamins: Secondary | ICD-10-CM | POA: Diagnosis not present

## 2016-10-08 DIAGNOSIS — E784 Other hyperlipidemia: Secondary | ICD-10-CM

## 2016-10-08 DIAGNOSIS — I959 Hypotension, unspecified: Secondary | ICD-10-CM | POA: Diagnosis not present

## 2016-10-08 DIAGNOSIS — R413 Other amnesia: Secondary | ICD-10-CM | POA: Diagnosis not present

## 2016-10-08 DIAGNOSIS — N185 Chronic kidney disease, stage 5: Secondary | ICD-10-CM | POA: Diagnosis not present

## 2016-10-08 DIAGNOSIS — I10 Essential (primary) hypertension: Secondary | ICD-10-CM | POA: Diagnosis not present

## 2016-10-08 DIAGNOSIS — E7849 Other hyperlipidemia: Secondary | ICD-10-CM

## 2016-10-08 MED ORDER — CYANOCOBALAMIN 1000 MCG/ML IJ SOLN
1000.0000 ug | Freq: Once | INTRAMUSCULAR | Status: AC
  Administered 2016-10-08: 1000 ug via INTRAMUSCULAR

## 2016-10-08 NOTE — Progress Notes (Signed)
Frank Huang  MRN: [id number] DOB: 1932-08-17  Subjective:  HPI  Patient is here for 4 months follow up. On his last visit his b/p was low and he was advised to stop Lasix. At dialysis they check this regularly and will say that readings are low. BP Readings from Last 3 Encounters:  10/08/16 98/60  08/21/16 (!) 92/56  07/08/16 (!) 78/20   Memory is somewhat worse since last visit per wife but patient feels like it is about the same. His sister stated that he repeats conversations, sometimes is not familiar with a place they are going to until his wife describes and tells him where it is. Patient Active Problem List   Diagnosis Date Noted  . Parotid mass 05/17/2016  . Nodule of right lung 05/11/2016  . Persistent atrial fibrillation ([place]) 02/05/2016  . B12 deficiency 07/25/2015  . Deficiency of vitamin B 07/25/2015  . Chest pain 07/01/2015  . Multiple myeloma ([place]) 04/12/2015  . Arteriosclerosis of coronary artery 04/09/2015  . CAFL (chronic airflow limitation) ([place]) 04/09/2015  . Chronic kidney disease requiring chronic dialysis ([place]) 04/09/2015  . Gastro-esophageal reflux disease without esophagitis 04/09/2015  . Gout 04/09/2015  . H/O acute myocardial infarction 04/09/2015  . HLD (hyperlipidemia) 04/09/2015  . BP (high blood pressure) 04/09/2015  . Bad memory 04/09/2015  . 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 Kidney failure 04/09/2015  . End-stage renal disease ([place]) 04/09/2015  . Chronic kidney disease (CKD), stage V ([place]) 07/20/2013  . Chronic kidney disease, stage V ([place]) 07/20/2013  . Absolute anemia 03/31/2013  . Neuropathy ([employer]) 03/31/2013    Past Medical History:  Diagnosis Date  . Cancer ([place])   . Chronic kidney disease   . COPD (chronic obstructive pulmonary disease) ([place])   . Coronary artery disease   . GERD (gastroesophageal reflux disease)   . Multiple myeloma ([place])   . Myocardial infarction 2005  . Neuropathy  ([place])   . Shortness of breath dyspnea   . Stroke (cerebrum) (HCC)    weakness Lt hand    Social History   Social History  . Marital status: Married    Spouse name: N/A  . Number of children: N/A  . Years of education: N/A   Occupational History  . Not on file.   Social History Main Topics  . Smoking status: Current Every Day Smoker    Packs/day: 0.25    Types: Cigarettes  . Smokeless tobacco: Not on file     Comment: smokes about 3 cigarettes per week  . Alcohol use No  . Drug use: No  . Sexual activity: Not on file   Other Topics Concern  . Not on file   Social History Narrative  . No narrative on file    Outpatient Encounter Prescriptions as of 10/08/2016  Medication Sig  . aspirin EC 81 MG tablet Take 81 mg by mouth daily.  . Cyanocobalamin (VITAMIN B 12 PO) Take by mouth.  . donepezil (ARICEPT) 10 MG tablet Take 1 tablet (10 mg total) by mouth at bedtime.  . lidocaine-prilocaine (EMLA) cream   . lovastatin (MEVACOR) 40 MG tablet TAKE 1 TABLET BY MOUTH EVERY DAY FOR HIGH CHOLESTEROL  . Multiple Vitamin (MULTI-VITAMINS) TABS Take by mouth. Reported on 04/01/2016  . omeprazole (PRILOSEC) 20 MG capsule Take 1 capsule (20 mg total) by mouth daily. TAKE 1 CAPSULE BY MOUTH EVERY DAY.   No facility-administered encounter medications on file as of 10/08/2016.  No Known Allergies  Review of Systems  Constitutional: Negative.   Eyes: Negative.   Respiratory: Negative.   Cardiovascular: Negative.   Gastrointestinal: Negative.   Musculoskeletal: Positive for back pain and joint pain.  Neurological: Negative.   Endo/Heme/Allergies: Negative.   Psychiatric/Behavioral: Positive for memory loss.    Objective:  BP 98/60   Pulse 76   Temp 98.5 F (36.9 C)   Resp 14   Wt 193 lb (87.5 kg)   BMI 24.12 kg/m   Physical Exam  Constitutional: He is well-developed, well-nourished, and in no distress.  HENT:  Head: Normocephalic and atraumatic.  Right Ear: External  ear normal.  Left Ear: External ear normal.  Nose: Nose normal.  Eyes: Conjunctivae are normal. Pupils are equal, round, and reactive to light.  Neck: Normal range of motion. Neck supple.  Cardiovascular: Normal rate, regular rhythm, normal heart sounds and intact distal pulses.   No murmur heard. Pulmonary/Chest: Effort normal and breath sounds normal. No respiratory distress. He has no wheezes.  Abdominal: Soft.  Neurological: He is alert. No cranial nerve deficit. He exhibits normal muscle tone. Gait normal.  Skin: Skin is warm and dry.  Many SKs.  Psychiatric: Mood, memory, affect and judgment normal.    Assessment and Plan :  1. B12 deficiency Administered today. - cyanocobalamin ((VITAMIN B-12)) injection 1,000 mcg; Inject 1 mL (1,000 mcg total) into the muscle once.  2. Hypotension, unspecified hypotension type Follow. No changes today.Discussed with wife and patient today. Pathology follows as patient tries to balance his appropriate weight. If he becomes symptomatic May need to add medication to raise blood pressure.  3. Chronic kidney disease (CKD), stage V (HCC)/hemodialysis Advised patient to make sure he goes to dialysis regularly. Stressed the importance of this.  4. Memory change/early dementia MMSE today 26/30. Stable. Advised patient to make sure he keeps up with hygiene and explained to patient that his wife is trying to help him.   5. Other hyperlipidemia Check lipids through dialysis when they check the rest of blood work. - Lipid Panel With LDL/HDL Ratio  HPI, Exam and A&P transcribed under direction and in the presence of Miguel Aschoff, MD. I have done the exam and reviewed the chart and it is accurate to the best of my knowledge. Development worker, community has been used and  any errors in dictation or transcription are unintentional. Miguel Aschoff M.D. [facility]

## 2016-10-09 ENCOUNTER — Ambulatory Visit: Payer: Medicare Other | Admitting: Family Medicine

## 2016-10-09 DIAGNOSIS — Z23 Encounter for immunization: Secondary | ICD-10-CM | POA: Diagnosis not present

## 2016-10-09 DIAGNOSIS — D689 Coagulation defect, unspecified: Secondary | ICD-10-CM | POA: Diagnosis not present

## 2016-10-09 DIAGNOSIS — D631 Anemia in chronic kidney disease: Secondary | ICD-10-CM | POA: Diagnosis not present

## 2016-10-09 DIAGNOSIS — N186 End stage renal disease: Secondary | ICD-10-CM | POA: Diagnosis not present

## 2016-10-09 DIAGNOSIS — N2581 Secondary hyperparathyroidism of renal origin: Secondary | ICD-10-CM | POA: Diagnosis not present

## 2016-10-10 DIAGNOSIS — I120 Hypertensive chronic kidney disease with stage 5 chronic kidney disease or end stage renal disease: Secondary | ICD-10-CM | POA: Diagnosis not present

## 2016-10-10 DIAGNOSIS — Z992 Dependence on renal dialysis: Secondary | ICD-10-CM | POA: Diagnosis not present

## 2016-10-10 DIAGNOSIS — N186 End stage renal disease: Secondary | ICD-10-CM | POA: Diagnosis not present

## 2016-10-11 ENCOUNTER — Ambulatory Visit
Admission: RE | Admit: 2016-10-11 | Discharge: 2016-10-11 | Disposition: A | Discharge status: Home / Self Care | Payer: Medicare Other | Source: Ambulatory Visit | Attending: Physician Assistant | Admitting: Physician Assistant

## 2016-10-11 ENCOUNTER — Encounter: Payer: Self-pay | Admitting: Physician Assistant

## 2016-10-11 ENCOUNTER — Ambulatory Visit (INDEPENDENT_AMBULATORY_CARE_PROVIDER_SITE_OTHER): Payer: Medicare Other | Admitting: Physician Assistant

## 2016-10-11 VITALS — BP 92/50 | HR 94 | Temp 97.9°F | Resp 20 | Wt 190.0 lb

## 2016-10-11 DIAGNOSIS — D689 Coagulation defect, unspecified: Secondary | ICD-10-CM | POA: Diagnosis not present

## 2016-10-11 DIAGNOSIS — R067 Sneezing: Secondary | ICD-10-CM | POA: Diagnosis not present

## 2016-10-11 DIAGNOSIS — R05 Cough: Secondary | ICD-10-CM | POA: Diagnosis not present

## 2016-10-11 DIAGNOSIS — R0989 Other specified symptoms and signs involving the circulatory and respiratory systems: Secondary | ICD-10-CM | POA: Insufficient documentation

## 2016-10-11 DIAGNOSIS — Z23 Encounter for immunization: Secondary | ICD-10-CM | POA: Diagnosis not present

## 2016-10-11 DIAGNOSIS — D631 Anemia in chronic kidney disease: Secondary | ICD-10-CM | POA: Diagnosis not present

## 2016-10-11 DIAGNOSIS — N186 End stage renal disease: Secondary | ICD-10-CM | POA: Diagnosis not present

## 2016-10-11 DIAGNOSIS — E784 Other hyperlipidemia: Secondary | ICD-10-CM | POA: Diagnosis not present

## 2016-10-11 DIAGNOSIS — R058 Other specified cough: Secondary | ICD-10-CM

## 2016-10-11 MED ORDER — LEVOFLOXACIN 500 MG PO TABS: 500.0000 mg | ORAL_TABLET | Freq: Every day | ORAL | 0 refills | Status: DC

## 2016-10-11 NOTE — Patient Instructions (Signed)

## 2016-10-11 NOTE — Progress Notes (Signed)
Patient: Frank Huang Male    DOB: Apr 28, 1932   82 y.o.   MRN: [id number] Visit Date: 10/11/2016  Today's Provider: Mar Daring, PA-C   Chief Complaint  Patient presents with  . URI   Subjective:    URI   This is a new problem. The current episode started yesterday. The problem has been gradually worsening. There has been no fever. Associated symptoms include congestion, coughing (productive with green sputum), rhinorrhea, sneezing and wheezing. Pertinent negatives include no abdominal pain, chest pain, diarrhea, ear pain, headaches, nausea, neck pain, plugged ear sensation, sinus pain, sore throat, swollen glands or vomiting. Treatments tried: OTC Mucinex. The treatment provided moderate relief.   Pt has COPD and is a current smoker. Pt's wife is concerned because pt has a H/O pneumonia, and she is concerned that this may be turning into pneumonia.     No Known Allergies   Current Outpatient Prescriptions:  .  aspirin EC 81 MG tablet, Take 81 mg by mouth daily., Disp: , Rfl:  .  Cyanocobalamin (VITAMIN B 12 PO), Take by mouth., Disp: , Rfl:  .  donepezil (ARICEPT) 10 MG tablet, Take 1 tablet (10 mg total) by mouth at bedtime., Disp: 90 tablet, Rfl: 3 .  lidocaine-prilocaine (EMLA) cream, , Disp: , Rfl:  .  lovastatin (MEVACOR) 40 MG tablet, TAKE 1 TABLET BY MOUTH EVERY DAY FOR HIGH CHOLESTEROL, Disp: , Rfl:  .  Multiple Vitamin (MULTI-VITAMINS) TABS, Take by mouth. Reported on 04/01/2016, Disp: , Rfl:  .  omeprazole (PRILOSEC) 20 MG capsule, Take 1 capsule (20 mg total) by mouth daily. TAKE 1 CAPSULE BY MOUTH EVERY DAY., Disp: 90 capsule, Rfl: 3  Review of Systems  Constitutional: Positive for fatigue (did just come from dialysis). Negative for chills and fever.  HENT: Positive for congestion, rhinorrhea and sneezing. Negative for ear pain, sinus pain, sinus pressure, sore throat and trouble swallowing.   Respiratory: Positive for cough (productive with green sputum)  and wheezing. Negative for chest tightness and shortness of breath.   Cardiovascular: Negative for chest pain.  Gastrointestinal: Negative for abdominal pain, diarrhea, nausea and vomiting.  Musculoskeletal: Negative for neck pain.  Neurological: Negative for headaches.    Social History  Substance Use Topics  . Smoking status: Current Every Day Smoker    Packs/day: 0.25    Types: Cigarettes  . Smokeless tobacco: Not on file     Comment: smokes about 3 cigarettes per week  . Alcohol use No   Objective:   BP (!) 92/50 (BP Location: Right Arm, Patient Position: Sitting, Cuff Size: Large) Comment: Pt just came from dialysis  Pulse 94   Temp 97.9 F (36.6 C) (Oral)   Resp 20   Wt 190 lb (86.2 kg)   SpO2 98%   BMI 23.75 kg/m   Physical Exam  Constitutional: He appears well-developed and well-nourished. No distress.  HENT:  Head: Normocephalic and atraumatic.  Right Ear: Hearing, tympanic membrane, external ear and ear canal normal. Tympanic membrane is not erythematous and not bulging. No middle ear effusion.  Left Ear: Hearing, tympanic membrane, external ear and ear canal normal. Tympanic membrane is not erythematous and not bulging.  No middle ear effusion.  Nose: Mucosal edema and rhinorrhea present. Right sinus exhibits no maxillary sinus tenderness and no frontal sinus tenderness. Left sinus exhibits no maxillary sinus tenderness and no frontal sinus tenderness.  Mouth/Throat: Uvula is midline, oropharynx is clear and moist and mucous  membranes are normal. No oropharyngeal exudate, posterior oropharyngeal edema or posterior oropharyngeal erythema.  Eyes: Conjunctivae and EOM are normal. Pupils are equal, round, and reactive to light. Right eye exhibits no discharge. Left eye exhibits no discharge.  Neck: Normal range of motion. Neck supple. No tracheal deviation present. No Brudzinski's sign and no Kernig's sign noted. No thyromegaly present.  Cardiovascular: Normal rate.  An  irregularly irregular rhythm present. Exam reveals no gallop and no friction rub.   Murmur heard. Pulmonary/Chest: Effort normal. No stridor. No respiratory distress. He has decreased breath sounds. He has no wheezes. He has no rhonchi. He has rales (throughout).  Lymphadenopathy:    He has no cervical adenopathy.  Skin: Skin is warm and dry. He is not diaphoretic.  Vitals reviewed.      Assessment & Plan:     1. Productive cough Worsening productive cough with COPD, current smoker, h/o lung nodule. Will get CXR due to adventitious lung sounds to r/o pneumonia. Will send in levaquin as below for treatment. He is to call if he develops increasing difficulty breathing or SOB. I will f/u pending xray results. - DG Chest 2 View; Future - levofloxacin (LEVAQUIN) 500 MG tablet; Take 1 tablet (500 mg total) by mouth daily.  Dispense: 10 tablet; Refill: 0  2. Chest rales See above medical treatment plan. - DG Chest 2 View; Future - levofloxacin (LEVAQUIN) 500 MG tablet; Take 1 tablet (500 mg total) by mouth daily.  Dispense: 10 tablet; Refill: 0     Patient seen and examined by Fenton Malling, PA, and note scribed by Renaldo Fiddler, CMA.   Mar Daring, PA-C  [facility]

## 2016-10-12 LAB — LIPID PANEL WITH LDL/HDL RATIO
CHOLESTEROL TOTAL: 126 mg/dL (ref 100–199)
HDL: 50 mg/dL (ref >39)
LDL CALC: 62 mg/dL (ref 0–99)
LDL/HDL RATIO: 1.2 ratio (ref 0.0–3.6)
TRIGLYCERIDES: 71 mg/dL (ref 0–149)
VLDL CHOLESTEROL CAL: 14 mg/dL (ref 5–40)

## 2016-10-14 DIAGNOSIS — N186 End stage renal disease: Secondary | ICD-10-CM | POA: Diagnosis not present

## 2016-10-14 DIAGNOSIS — D631 Anemia in chronic kidney disease: Secondary | ICD-10-CM | POA: Diagnosis not present

## 2016-10-14 DIAGNOSIS — Z23 Encounter for immunization: Secondary | ICD-10-CM | POA: Diagnosis not present

## 2016-10-14 DIAGNOSIS — D689 Coagulation defect, unspecified: Secondary | ICD-10-CM | POA: Diagnosis not present

## 2016-10-18 DIAGNOSIS — D689 Coagulation defect, unspecified: Secondary | ICD-10-CM | POA: Diagnosis not present

## 2016-10-18 DIAGNOSIS — Z23 Encounter for immunization: Secondary | ICD-10-CM | POA: Diagnosis not present

## 2016-10-18 DIAGNOSIS — D631 Anemia in chronic kidney disease: Secondary | ICD-10-CM | POA: Diagnosis not present

## 2016-10-18 DIAGNOSIS — N186 End stage renal disease: Secondary | ICD-10-CM | POA: Diagnosis not present

## 2016-10-21 DIAGNOSIS — D631 Anemia in chronic kidney disease: Secondary | ICD-10-CM | POA: Diagnosis not present

## 2016-10-21 DIAGNOSIS — N186 End stage renal disease: Secondary | ICD-10-CM | POA: Diagnosis not present

## 2016-10-21 DIAGNOSIS — D689 Coagulation defect, unspecified: Secondary | ICD-10-CM | POA: Diagnosis not present

## 2016-10-21 DIAGNOSIS — Z23 Encounter for immunization: Secondary | ICD-10-CM | POA: Diagnosis not present

## 2016-10-23 DIAGNOSIS — D631 Anemia in chronic kidney disease: Secondary | ICD-10-CM | POA: Diagnosis not present

## 2016-10-23 DIAGNOSIS — D689 Coagulation defect, unspecified: Secondary | ICD-10-CM | POA: Diagnosis not present

## 2016-10-23 DIAGNOSIS — N186 End stage renal disease: Secondary | ICD-10-CM | POA: Diagnosis not present

## 2016-10-23 DIAGNOSIS — Z23 Encounter for immunization: Secondary | ICD-10-CM | POA: Diagnosis not present

## 2016-10-25 DIAGNOSIS — D631 Anemia in chronic kidney disease: Secondary | ICD-10-CM | POA: Diagnosis not present

## 2016-10-25 DIAGNOSIS — D689 Coagulation defect, unspecified: Secondary | ICD-10-CM | POA: Diagnosis not present

## 2016-10-25 DIAGNOSIS — N186 End stage renal disease: Secondary | ICD-10-CM | POA: Diagnosis not present

## 2016-10-25 DIAGNOSIS — Z23 Encounter for immunization: Secondary | ICD-10-CM | POA: Diagnosis not present

## 2016-10-28 DIAGNOSIS — Z23 Encounter for immunization: Secondary | ICD-10-CM | POA: Diagnosis not present

## 2016-10-28 DIAGNOSIS — D689 Coagulation defect, unspecified: Secondary | ICD-10-CM | POA: Diagnosis not present

## 2016-10-28 DIAGNOSIS — D631 Anemia in chronic kidney disease: Secondary | ICD-10-CM | POA: Diagnosis not present

## 2016-10-28 DIAGNOSIS — N186 End stage renal disease: Secondary | ICD-10-CM | POA: Diagnosis not present

## 2016-10-30 DIAGNOSIS — N186 End stage renal disease: Secondary | ICD-10-CM | POA: Diagnosis not present

## 2016-10-30 DIAGNOSIS — D689 Coagulation defect, unspecified: Secondary | ICD-10-CM | POA: Diagnosis not present

## 2016-10-30 DIAGNOSIS — Z23 Encounter for immunization: Secondary | ICD-10-CM | POA: Diagnosis not present

## 2016-10-30 DIAGNOSIS — D631 Anemia in chronic kidney disease: Secondary | ICD-10-CM | POA: Diagnosis not present

## 2016-11-01 DIAGNOSIS — D689 Coagulation defect, unspecified: Secondary | ICD-10-CM | POA: Diagnosis not present

## 2016-11-01 DIAGNOSIS — N186 End stage renal disease: Secondary | ICD-10-CM | POA: Diagnosis not present

## 2016-11-01 DIAGNOSIS — D631 Anemia in chronic kidney disease: Secondary | ICD-10-CM | POA: Diagnosis not present

## 2016-11-01 DIAGNOSIS — Z23 Encounter for immunization: Secondary | ICD-10-CM | POA: Diagnosis not present

## 2016-11-03 DIAGNOSIS — N186 End stage renal disease: Secondary | ICD-10-CM | POA: Diagnosis not present

## 2016-11-03 DIAGNOSIS — Z23 Encounter for immunization: Secondary | ICD-10-CM | POA: Diagnosis not present

## 2016-11-03 DIAGNOSIS — D689 Coagulation defect, unspecified: Secondary | ICD-10-CM | POA: Diagnosis not present

## 2016-11-03 DIAGNOSIS — D631 Anemia in chronic kidney disease: Secondary | ICD-10-CM | POA: Diagnosis not present

## 2016-11-05 ENCOUNTER — Other Ambulatory Visit: Payer: Self-pay | Admitting: Family Medicine

## 2016-11-06 DIAGNOSIS — D631 Anemia in chronic kidney disease: Secondary | ICD-10-CM | POA: Diagnosis not present

## 2016-11-06 DIAGNOSIS — N186 End stage renal disease: Secondary | ICD-10-CM | POA: Diagnosis not present

## 2016-11-06 DIAGNOSIS — Z23 Encounter for immunization: Secondary | ICD-10-CM | POA: Diagnosis not present

## 2016-11-06 DIAGNOSIS — D689 Coagulation defect, unspecified: Secondary | ICD-10-CM | POA: Diagnosis not present

## 2016-11-08 DIAGNOSIS — Z23 Encounter for immunization: Secondary | ICD-10-CM | POA: Diagnosis not present

## 2016-11-08 DIAGNOSIS — D631 Anemia in chronic kidney disease: Secondary | ICD-10-CM | POA: Diagnosis not present

## 2016-11-08 DIAGNOSIS — D689 Coagulation defect, unspecified: Secondary | ICD-10-CM | POA: Diagnosis not present

## 2016-11-08 DIAGNOSIS — N186 End stage renal disease: Secondary | ICD-10-CM | POA: Diagnosis not present

## 2016-11-10 DIAGNOSIS — Z992 Dependence on renal dialysis: Secondary | ICD-10-CM | POA: Diagnosis not present

## 2016-11-10 DIAGNOSIS — N186 End stage renal disease: Secondary | ICD-10-CM | POA: Diagnosis not present

## 2016-11-10 DIAGNOSIS — I120 Hypertensive chronic kidney disease with stage 5 chronic kidney disease or end stage renal disease: Secondary | ICD-10-CM | POA: Diagnosis not present

## 2016-11-12 ENCOUNTER — Ambulatory Visit
Admission: RE | Admit: 2016-11-12 | Discharge: 2016-11-12 | Disposition: A | Discharge status: Home / Self Care | Payer: Medicare Other | Source: Ambulatory Visit | Attending: Family Medicine | Admitting: Family Medicine

## 2016-11-12 ENCOUNTER — Ambulatory Visit (INDEPENDENT_AMBULATORY_CARE_PROVIDER_SITE_OTHER): Payer: Medicare Other | Admitting: Family Medicine

## 2016-11-12 VITALS — BP 108/72 | HR 76 | Temp 97.5°F | Resp 16 | Wt 190.0 lb

## 2016-11-12 DIAGNOSIS — M25561 Pain in right knee: Secondary | ICD-10-CM

## 2016-11-12 DIAGNOSIS — E538 Deficiency of other specified B group vitamins: Secondary | ICD-10-CM

## 2016-11-12 DIAGNOSIS — M1711 Unilateral primary osteoarthritis, right knee: Secondary | ICD-10-CM | POA: Insufficient documentation

## 2016-11-12 MED ORDER — PREDNISONE 10 MG (21) PO TBPK: ORAL_TABLET | ORAL | 0 refills | Status: DC

## 2016-11-12 MED ORDER — CYANOCOBALAMIN 1000 MCG/ML IJ SOLN
1000.0000 ug | Freq: Once | INTRAMUSCULAR | Status: AC
  Administered 2016-11-12: 1000 ug via INTRAMUSCULAR

## 2016-11-12 NOTE — Progress Notes (Signed)
THORSTEN CLIMER  MRN: [id number] DOB: 1932/03/14  Subjective:  HPI  Patient states his right knee has been hurting since Friday 11/08/16. Swelling is present, pain is present only with movement. No pain with rest. Pain radiates up the leg at times. No fever. No pain in the calf area. No redness. No injury that patient recalls. Patient states it feels like its "bone on bone." He has taking some Aleve for the pain.  Patient Active Problem List   Diagnosis Date Noted  . Parotid mass 05/17/2016  . Nodule of right lung 05/11/2016  . Persistent atrial fibrillation ([place]) 02/05/2016  . B12 deficiency 07/25/2015  . Deficiency of vitamin B 07/25/2015  . Chest pain 07/01/2015  . Multiple myeloma ([place]) 04/12/2015  . Arteriosclerosis of coronary artery 04/09/2015  . CAFL (chronic airflow limitation) ([place]) 04/09/2015  . Chronic kidney disease requiring chronic dialysis ([place]) 04/09/2015  . Gastro-esophageal reflux disease without esophagitis 04/09/2015  . Gout 04/09/2015  . H/O acute myocardial infarction 04/09/2015  . HLD (hyperlipidemia) 04/09/2015  . BP (high blood pressure) 04/09/2015  . Bad memory 04/09/2015  . 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 Kidney failure 04/09/2015  . End-stage renal disease ([place]) 04/09/2015  . Chronic kidney disease (CKD), stage V ([place]) 07/20/2013  . Chronic kidney disease, stage V ([place]) 07/20/2013  . Absolute anemia 03/31/2013  . Neuropathy ([place]) 03/31/2013    Past Medical History:  Diagnosis Date  . Cancer ([place])   . Chronic kidney disease   . COPD (chronic obstructive pulmonary disease) ([place])   . Coronary artery disease   . GERD (gastroesophageal reflux disease)   . Multiple myeloma ([place])   . Myocardial infarction 2005  . Neuropathy ([place])   . Shortness of breath dyspnea   . Stroke (cerebrum) (HCC)    weakness Lt hand    Social History   Social History  . Marital status: Married    Spouse name: N/A  . Number  of children: N/A  . Years of education: N/A   Occupational History  . Not on file.   Social History Main Topics  . Smoking status: Current Every Day Smoker    Packs/day: 0.50    Types: Cigarettes  . Smokeless tobacco: Never Used     Comment: smokes about 3 cigarettes per week  . Alcohol use No  . Drug use: No  . Sexual activity: Not on file   Other Topics Concern  . Not on file   Social History Narrative  . No narrative on file    Outpatient Encounter Prescriptions as of 11/12/2016  Medication Sig  . aspirin EC 81 MG tablet Take 81 mg by mouth daily.  . Cyanocobalamin (VITAMIN B 12 PO) Take by mouth.  . donepezil (ARICEPT) 10 MG tablet Take 1 tablet (10 mg total) by mouth at bedtime.  . lidocaine-prilocaine (EMLA) cream   . lovastatin (MEVACOR) 40 MG tablet TAKE 1 TABLET BY MOUTH EVERY DAY FOR HIGH CHOLESTEROL  . Multiple Vitamin (MULTI-VITAMINS) TABS Take by mouth. Reported on 04/01/2016  . omeprazole (PRILOSEC) 20 MG capsule TAKE 1 CAPSULE BY MOUTH  DAILY.  . [DISCONTINUED] levofloxacin (LEVAQUIN) 500 MG tablet Take 1 tablet (500 mg total) by mouth daily.   No facility-administered encounter medications on file as of 11/12/2016.     No Known Allergies  Review of Systems  Constitutional: Positive for malaise/fatigue.  Respiratory: Negative.   Cardiovascular: Negative.   Musculoskeletal: Positive for joint pain.  Right knee pain and swelling  Neurological: Negative.     Objective:  BP 108/72   Pulse 76   Temp 97.5 F (36.4 C)   Resp 16   Wt 190 lb (86.2 kg)   BMI 23.75 kg/m   Physical Exam  Constitutional: He is well-developed, well-nourished, and in no distress.  HENT:  Head: Normocephalic and atraumatic.  Right Ear: External ear normal.  Left Ear: External ear normal.  Nose: Nose normal.  Eyes: Conjunctivae are normal. No scleral icterus.  Neck: No thyromegaly present.  Cardiovascular: Normal rate and regular rhythm.   Pulmonary/Chest: Effort  normal and breath sounds normal.  Abdominal: Soft.  Musculoskeletal: He exhibits no edema or tenderness.  Neurological: He is alert.  Skin: Skin is warm and dry.  Psychiatric: Mood and affect normal.    Assessment and Plan :  Osteoarthritis of knee most likely End-stage renal disease on dialysis X-ray right knee. Try prednisone 10 mg 6 day taper. Refer to Ortho if not improving  I have done the exam and reviewed the chart and it is accurate to the best of my knowledge. Development worker, community has been used and  any errors in dictation or transcription are unintentional. Miguel Aschoff M.D. [facility]

## 2016-11-13 DIAGNOSIS — N186 End stage renal disease: Secondary | ICD-10-CM | POA: Diagnosis not present

## 2016-11-13 DIAGNOSIS — D689 Coagulation defect, unspecified: Secondary | ICD-10-CM | POA: Diagnosis not present

## 2016-11-13 DIAGNOSIS — D631 Anemia in chronic kidney disease: Secondary | ICD-10-CM | POA: Diagnosis not present

## 2016-11-13 DIAGNOSIS — D509 Iron deficiency anemia, unspecified: Secondary | ICD-10-CM | POA: Diagnosis not present

## 2016-11-13 DIAGNOSIS — N2581 Secondary hyperparathyroidism of renal origin: Secondary | ICD-10-CM | POA: Diagnosis not present

## 2016-11-14 ENCOUNTER — Inpatient Hospital Stay
Admission: EM | Admit: 2016-11-14 | Discharge: 2016-11-16 | DRG: 286 | Disposition: A | Discharge status: Home / Self Care | Payer: Medicare Other | Attending: Internal Medicine | Admitting: Internal Medicine

## 2016-11-14 ENCOUNTER — Emergency Department: Payer: Medicare Other

## 2016-11-14 DIAGNOSIS — D631 Anemia in chronic kidney disease: Secondary | ICD-10-CM | POA: Diagnosis not present

## 2016-11-14 DIAGNOSIS — I481 Persistent atrial fibrillation: Secondary | ICD-10-CM | POA: Diagnosis present

## 2016-11-14 DIAGNOSIS — R413 Other amnesia: Secondary | ICD-10-CM | POA: Diagnosis not present

## 2016-11-14 DIAGNOSIS — I25110 Atherosclerotic heart disease of native coronary artery with unstable angina pectoris: Secondary | ICD-10-CM | POA: Diagnosis not present

## 2016-11-14 DIAGNOSIS — Z96642 Presence of left artificial hip joint: Secondary | ICD-10-CM | POA: Diagnosis not present

## 2016-11-14 DIAGNOSIS — J449 Chronic obstructive pulmonary disease, unspecified: Secondary | ICD-10-CM | POA: Diagnosis present

## 2016-11-14 DIAGNOSIS — Z820 Family history of epilepsy and other diseases of the nervous system: Secondary | ICD-10-CM | POA: Diagnosis not present

## 2016-11-14 DIAGNOSIS — I340 Nonrheumatic mitral (valve) insufficiency: Secondary | ICD-10-CM | POA: Diagnosis not present

## 2016-11-14 DIAGNOSIS — I420 Dilated cardiomyopathy: Secondary | ICD-10-CM | POA: Diagnosis present

## 2016-11-14 DIAGNOSIS — I252 Old myocardial infarction: Secondary | ICD-10-CM

## 2016-11-14 DIAGNOSIS — G629 Polyneuropathy, unspecified: Secondary | ICD-10-CM | POA: Diagnosis present

## 2016-11-14 DIAGNOSIS — Z79899 Other long term (current) drug therapy: Secondary | ICD-10-CM

## 2016-11-14 DIAGNOSIS — J45901 Unspecified asthma with (acute) exacerbation: Secondary | ICD-10-CM | POA: Diagnosis present

## 2016-11-14 DIAGNOSIS — C9000 Multiple myeloma not having achieved remission: Secondary | ICD-10-CM | POA: Diagnosis present

## 2016-11-14 DIAGNOSIS — N186 End stage renal disease: Secondary | ICD-10-CM | POA: Diagnosis not present

## 2016-11-14 DIAGNOSIS — I214 Non-ST elevation (NSTEMI) myocardial infarction: Secondary | ICD-10-CM | POA: Diagnosis present

## 2016-11-14 DIAGNOSIS — F17210 Nicotine dependence, cigarettes, uncomplicated: Secondary | ICD-10-CM | POA: Diagnosis present

## 2016-11-14 DIAGNOSIS — Z7982 Long term (current) use of aspirin: Secondary | ICD-10-CM | POA: Diagnosis not present

## 2016-11-14 DIAGNOSIS — R079 Chest pain, unspecified: Secondary | ICD-10-CM | POA: Diagnosis present

## 2016-11-14 DIAGNOSIS — N2581 Secondary hyperparathyroidism of renal origin: Secondary | ICD-10-CM | POA: Diagnosis present

## 2016-11-14 DIAGNOSIS — I2510 Atherosclerotic heart disease of native coronary artery without angina pectoris: Secondary | ICD-10-CM | POA: Diagnosis not present

## 2016-11-14 DIAGNOSIS — Z8673 Personal history of transient ischemic attack (TIA), and cerebral infarction without residual deficits: Secondary | ICD-10-CM

## 2016-11-14 DIAGNOSIS — K219 Gastro-esophageal reflux disease without esophagitis: Secondary | ICD-10-CM | POA: Diagnosis present

## 2016-11-14 DIAGNOSIS — T380X5A Adverse effect of glucocorticoids and synthetic analogues, initial encounter: Secondary | ICD-10-CM | POA: Diagnosis not present

## 2016-11-14 DIAGNOSIS — Z992 Dependence on renal dialysis: Secondary | ICD-10-CM

## 2016-11-14 DIAGNOSIS — R739 Hyperglycemia, unspecified: Secondary | ICD-10-CM | POA: Diagnosis present

## 2016-11-14 LAB — CBC
HCT: 32.6 % — ABNORMAL LOW (ref 40.0–52.0)
Hemoglobin: 11 g/dL — ABNORMAL LOW (ref 13.0–18.0)
MCH: 33.8 pg (ref 26.0–34.0)
MCHC: 33.6 g/dL (ref 32.0–36.0)
MCV: 100.6 fL — AB (ref 80.0–100.0)
PLATELETS: 153 10*3/uL (ref 150–440)
RBC: 3.24 MIL/uL — ABNORMAL LOW (ref 4.40–5.90)
RDW: 15.1 % — AB (ref 11.5–14.5)
WBC: 9 10*3/uL (ref 3.8–10.6)

## 2016-11-14 LAB — BASIC METABOLIC PANEL
Anion gap: 13 (ref 5–15)
BUN: 62 mg/dL — AB (ref 6–20)
CALCIUM: 9.6 mg/dL (ref 8.9–10.3)
CO2: 32 mmol/L (ref 22–32)
CREATININE: 5.78 mg/dL — AB (ref 0.61–1.24)
Chloride: 94 mmol/L — ABNORMAL LOW (ref 101–111)
GFR calc Af Amer: 9 mL/min — ABNORMAL LOW (ref >60)
GFR, EST NON AFRICAN AMERICAN: 8 mL/min — AB (ref >60)
Glucose, Bld: 224 mg/dL — ABNORMAL HIGH (ref 65–99)
Potassium: 4 mmol/L (ref 3.5–5.1)
SODIUM: 139 mmol/L (ref 135–145)

## 2016-11-14 LAB — TROPONIN I: TROPONIN I: 0.03 ng/mL — AB (ref <0.03)

## 2016-11-14 MED ORDER — ASPIRIN 81 MG PO CHEW
324.0000 mg | CHEWABLE_TABLET | Freq: Once | ORAL | Status: AC
  Administered 2016-11-14: 324 mg via ORAL
  Filled 2016-11-14: qty 4

## 2016-11-14 NOTE — ED Triage Notes (Signed)
Pt bib EMS from home w/ CP that began this AM, gradually worsening.  Pt denies SOB, n/v/d, dizziness or fever. Pt sts "feels like something is standing on my chest'.  Pt c/o L arm weakness.

## 2016-11-14 NOTE — ED Provider Notes (Signed)
  [facility] Emergency Department Provider Note   ____________________________________________    I have reviewed the triage vital signs and the nursing notes.   HISTORY  Chief Complaint Chest Pain     HPI Frank Huang is a 83 y.o. male who presents with complaints of chest pain. Patient reports he felt like a man was standing on his chest for a prolonged period of time. He reports recently he is started to feel better, he continues to have some pressure-like pain in his chest. He reports a history of a heart attack in 2005. He also complains of cough and mild shortness of breath.   Past Medical History:  Diagnosis Date  . Cancer (HCC)   . Chronic kidney disease   . COPD (chronic obstructive pulmonary disease) (HCC)   . Coronary artery disease   . GERD (gastroesophageal reflux disease)   . Multiple myeloma (HCC)   . Myocardial infarction 2005  . Neuropathy (HCC)   . Shortness of breath dyspnea   . Stroke (cerebrum) (HCC)    weakness Lt hand    Patient Active Problem List   Diagnosis Date Noted  . Chest pain, rule out acute myocardial infarction 11/14/2016  . Parotid mass 05/17/2016  . Nodule of right lung 05/11/2016  . Persistent atrial fibrillation (HCC) 02/05/2016  . B12 deficiency 07/25/2015  . Deficiency of vitamin B 07/25/2015  . Chest pain 07/01/2015  . Multiple myeloma (HCC) 04/12/2015  . Arteriosclerosis of coronary artery 04/09/2015  . CAFL (chronic airflow limitation) (HCC) 04/09/2015  . Chronic kidney disease requiring chronic dialysis (HCC) 04/09/2015  . Gastro-esophageal reflux disease without esophagitis 04/09/2015  . Gout 04/09/2015  . H/O acute myocardial infarction 04/09/2015  . HLD (hyperlipidemia) 04/09/2015  . BP (high blood pressure) 04/09/2015  . Bad memory 04/09/2015  . 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 Kahler disease (HCC) 04/09/2015  . Kidney failure 04/09/2015  . End-stage renal disease (HCC)  04/09/2015  . Chronic kidney disease (CKD), stage V (HCC) 07/20/2013  . Chronic kidney disease, stage V (HCC) 07/20/2013  . Absolute anemia 03/31/2013  . Neuropathy (HCC) 03/31/2013    Past Surgical History:  Procedure Laterality Date  . APPENDECTOMY    . AV FISTULA PLACEMENT Left   . CHOLECYSTECTOMY    . SHOULDER ARTHROSCOPY WITH OPEN ROTATOR CUFF REPAIR Left 09/19/2015   Procedure: SHOULDER ARTHROSCOPY , subacromial decompression, debridement;  Surgeon: John J Poggi, MD;  Location: [facility] ORS;  Service: Orthopedics;  Laterality: Left;  . TOTAL HIP ARTHROPLASTY Left     Prior to Admission medications   Medication Sig Start Date End Date Taking? Authorizing Provider  aspirin EC 81 MG tablet Take 81 mg by mouth daily.   Yes Historical Provider, MD  Cyanocobalamin (VITAMIN B 12 PO) Take by mouth.   Yes Historical Provider, MD  donepezil (ARICEPT) 10 MG tablet Take 1 tablet (10 mg total) by mouth at bedtime. 01/02/16  Yes Richard L Gilbert Jr., MD  lidocaine-prilocaine (EMLA) cream  04/18/16  Yes Historical Provider, MD  lovastatin (MEVACOR) 40 MG tablet TAKE 1 TABLET BY MOUTH EVERY DAY FOR HIGH CHOLESTEROL 08/10/14  Yes Historical Provider, MD  Multiple Vitamin (MULTI-VITAMINS) TABS Take by mouth. Reported on 04/01/2016   Yes Historical Provider, MD  omeprazole (PRILOSEC) 20 MG capsule TAKE 1 CAPSULE BY MOUTH  DAILY. 11/05/16  Yes Richard L Gilbert Jr., MD  RENVELA 800 MG tablet Take 800 mg by mouth 3 (three) times daily. 11/05/16  Yes Historical   Provider, MD  predniSONE (STERAPRED UNI-PAK 21 TAB) 10 MG (21) TBPK tablet Take as directed Patient not taking: Reported on 11/14/2016 11/12/16   Richard L Gilbert Jr., MD     Allergies Patient has no known allergies.  Family History  Problem Relation Age of Onset  . Alzheimer's disease Mother   . Kidney failure Father   . Bone cancer Sister   . Stomach cancer Sister     Social History Social History  Substance Use Topics  . Smoking status:  Current Every Day Smoker    Packs/day: 0.50    Types: Cigarettes  . Smokeless tobacco: Never Used     Comment: smokes about 3 cigarettes per week  . Alcohol use No    Review of Systems  Constitutional: No fever/chills  ENT: No sore throat. Cardiovascular: As above Respiratory: As above Gastrointestinal: No abdominal pain.    Genitourinary: Negative for dysuria. Musculoskeletal: Negative for back pain.  Neurological: Negative for headaches  10-point ROS otherwise negative.  ____________________________________________   PHYSICAL EXAM:  VITAL SIGNS: ED Triage Vitals [11/14/16 1932]  Enc Vitals Group     BP (!) 160/77     Pulse Rate (!) 102     Resp (!) 23     Temp 97.8 F (36.6 C)     Temp Source Oral     SpO2 99 %     Weight 184 lb (83.5 kg)     Height 6' 3" (1.905 m)     Head Circumference      Peak Flow      Pain Score 5     Pain Loc      Pain Edu?      Excl. in GC?     Constitutional: Alert and oriented. No acute distress. Pleasant and interactive Eyes: Conjunctivae are normal.  Head: Atraumatic. Nose: No congestion/rhinnorhea. Mouth/Throat: Mucous membranes are moist.   Cardiovascular: Normal rate, regular rhythm.   Good peripheral circulation. Respiratory: Mildly increased respiratory effort  No retractions. Bibasilar rales. Gastrointestinal: Soft and nontender. No distention.  No CVA tenderness. Genitourinary: deferred Musculoskeletal:   Warm and well perfused Neurologic:  Normal speech and language. No gross focal neurologic deficits are appreciated.  Skin:  Skin is warm, dry and intact. No rash noted. Psychiatric: Mood and affect are normal. Speech and behavior are normal.  ____________________________________________   LABS (all labs ordered are listed, but only abnormal results are displayed)  Labs Reviewed  BASIC METABOLIC PANEL - Abnormal; Notable for the following:       Result Value   Chloride 94 (*)    Glucose, Bld 224 (*)    BUN  62 (*)    Creatinine, Ser 5.78 (*)    GFR calc non Af Amer 8 (*)    GFR calc Af Amer 9 (*)    All other components within normal limits  CBC - Abnormal; Notable for the following:    RBC 3.24 (*)    Hemoglobin 11.0 (*)    HCT 32.6 (*)    MCV 100.6 (*)    RDW 15.1 (*)    All other components within normal limits  TROPONIN I - Abnormal; Notable for the following:    Troponin I 0.03 (*)    All other components within normal limits   ____________________________________________  EKG  ED ECG REPORT I, , , the attending physician, personally viewed and interpreted this ECG.  Date: 11/14/2016  Rate: 111 Rhythm: Atrial fibrillation QRS Axis: normal  ST/T Wave   abnormalities: Nonspecific Conduction Disturbances: none   ____________________________________________  RADIOLOGY  Chest x-ray unremarkable ____________________________________________   PROCEDURES  Procedure(s) performed: No    Critical Care performed: No ____________________________________________   INITIAL IMPRESSION / ASSESSMENT AND PLAN / ED COURSE  Pertinent labs & imaging results that were available during my care of the patient were reviewed by me and considered in my medical decision making (see chart for details).  Patient presents with concerning description of chest pain. He has a history of CAD. He also appears to be in atrial fibrillation today which I do not see a record of. I'm concerned about his chest pain and I think he will require admission to the hospital for further evaluation and management.  Clinical Course    ____________________________________________   FINAL CLINICAL IMPRESSION(S) / ED DIAGNOSES  Final diagnoses:  Chest pain, unspecified type      NEW MEDICATIONS STARTED DURING THIS VISIT:  New Prescriptions   No medications on file     Note:  This document was prepared using Dragon voice recognition software and may include unintentional dictation  errors.     , MD 11/14/16 2214  

## 2016-11-14 NOTE — ED Notes (Signed)
Patient transported to X-ray 

## 2016-11-15 ENCOUNTER — Encounter
Admission: EM | Disposition: A | Discharge status: Home / Self Care | Payer: Self-pay | Source: Home / Self Care | Attending: Internal Medicine

## 2016-11-15 DIAGNOSIS — Z992 Dependence on renal dialysis: Secondary | ICD-10-CM | POA: Diagnosis not present

## 2016-11-15 DIAGNOSIS — J449 Chronic obstructive pulmonary disease, unspecified: Secondary | ICD-10-CM | POA: Diagnosis present

## 2016-11-15 DIAGNOSIS — F17210 Nicotine dependence, cigarettes, uncomplicated: Secondary | ICD-10-CM | POA: Diagnosis present

## 2016-11-15 DIAGNOSIS — Z7982 Long term (current) use of aspirin: Secondary | ICD-10-CM | POA: Diagnosis not present

## 2016-11-15 DIAGNOSIS — J45901 Unspecified asthma with (acute) exacerbation: Secondary | ICD-10-CM | POA: Diagnosis present

## 2016-11-15 DIAGNOSIS — T380X5A Adverse effect of glucocorticoids and synthetic analogues, initial encounter: Secondary | ICD-10-CM | POA: Diagnosis not present

## 2016-11-15 DIAGNOSIS — Z820 Family history of epilepsy and other diseases of the nervous system: Secondary | ICD-10-CM | POA: Diagnosis not present

## 2016-11-15 DIAGNOSIS — Z96642 Presence of left artificial hip joint: Secondary | ICD-10-CM | POA: Diagnosis present

## 2016-11-15 DIAGNOSIS — I214 Non-ST elevation (NSTEMI) myocardial infarction: Secondary | ICD-10-CM | POA: Diagnosis present

## 2016-11-15 DIAGNOSIS — Z79899 Other long term (current) drug therapy: Secondary | ICD-10-CM | POA: Diagnosis not present

## 2016-11-15 DIAGNOSIS — N186 End stage renal disease: Secondary | ICD-10-CM | POA: Diagnosis present

## 2016-11-15 DIAGNOSIS — D631 Anemia in chronic kidney disease: Secondary | ICD-10-CM | POA: Diagnosis present

## 2016-11-15 DIAGNOSIS — I481 Persistent atrial fibrillation: Secondary | ICD-10-CM | POA: Diagnosis present

## 2016-11-15 DIAGNOSIS — N2581 Secondary hyperparathyroidism of renal origin: Secondary | ICD-10-CM | POA: Diagnosis present

## 2016-11-15 DIAGNOSIS — I252 Old myocardial infarction: Secondary | ICD-10-CM | POA: Diagnosis not present

## 2016-11-15 DIAGNOSIS — R413 Other amnesia: Secondary | ICD-10-CM | POA: Diagnosis present

## 2016-11-15 DIAGNOSIS — K219 Gastro-esophageal reflux disease without esophagitis: Secondary | ICD-10-CM | POA: Diagnosis present

## 2016-11-15 DIAGNOSIS — R079 Chest pain, unspecified: Secondary | ICD-10-CM | POA: Diagnosis present

## 2016-11-15 DIAGNOSIS — I340 Nonrheumatic mitral (valve) insufficiency: Secondary | ICD-10-CM | POA: Diagnosis present

## 2016-11-15 DIAGNOSIS — C9000 Multiple myeloma not having achieved remission: Secondary | ICD-10-CM | POA: Diagnosis present

## 2016-11-15 DIAGNOSIS — G629 Polyneuropathy, unspecified: Secondary | ICD-10-CM | POA: Diagnosis present

## 2016-11-15 DIAGNOSIS — Z8673 Personal history of transient ischemic attack (TIA), and cerebral infarction without residual deficits: Secondary | ICD-10-CM | POA: Diagnosis not present

## 2016-11-15 DIAGNOSIS — R739 Hyperglycemia, unspecified: Secondary | ICD-10-CM | POA: Diagnosis present

## 2016-11-15 DIAGNOSIS — I25110 Atherosclerotic heart disease of native coronary artery with unstable angina pectoris: Secondary | ICD-10-CM | POA: Diagnosis present

## 2016-11-15 DIAGNOSIS — I420 Dilated cardiomyopathy: Secondary | ICD-10-CM | POA: Diagnosis present

## 2016-11-15 HISTORY — PX: CARDIAC CATHETERIZATION: SHX172

## 2016-11-15 LAB — LIPID PANEL
Cholesterol: 128 mg/dL (ref 0–200)
HDL: 43 mg/dL (ref >40)
LDL CALC: 73 mg/dL (ref 0–99)
TRIGLYCERIDES: 58 mg/dL (ref <150)
Total CHOL/HDL Ratio: 3 RATIO
VLDL: 12 mg/dL (ref 0–40)

## 2016-11-15 LAB — CBC
HEMATOCRIT: 29.3 % — AB (ref 40.0–52.0)
Hemoglobin: 10.1 g/dL — ABNORMAL LOW (ref 13.0–18.0)
MCH: 34.4 pg — ABNORMAL HIGH (ref 26.0–34.0)
MCHC: 34.3 g/dL (ref 32.0–36.0)
MCV: 100.1 fL — ABNORMAL HIGH (ref 80.0–100.0)
Platelets: 131 10*3/uL — ABNORMAL LOW (ref 150–440)
RBC: 2.93 MIL/uL — ABNORMAL LOW (ref 4.40–5.90)
RDW: 15 % — AB (ref 11.5–14.5)
WBC: 9.2 10*3/uL (ref 3.8–10.6)

## 2016-11-15 LAB — CREATININE, SERUM
CREATININE: 5.66 mg/dL — AB (ref 0.61–1.24)
GFR, EST AFRICAN AMERICAN: 10 mL/min — AB (ref >60)
GFR, EST NON AFRICAN AMERICAN: 8 mL/min — AB (ref >60)

## 2016-11-15 LAB — GLUCOSE, CAPILLARY
GLUCOSE-CAPILLARY: 97 mg/dL (ref 65–99)
GLUCOSE-CAPILLARY: 104 mg/dL — AB (ref 65–99)
Glucose-Capillary: 116 mg/dL — ABNORMAL HIGH (ref 65–99)

## 2016-11-15 LAB — TROPONIN I
TROPONIN I: 0.58 ng/mL — AB (ref <0.03)
TROPONIN I: 0.77 ng/mL — AB (ref <0.03)
Troponin I: 0.4 ng/mL (ref <0.03)

## 2016-11-15 LAB — HEPARIN LEVEL (UNFRACTIONATED)

## 2016-11-15 LAB — PROTIME-INR
INR: 1.12
PROTHROMBIN TIME: 14.5 s (ref 11.4–15.2)

## 2016-11-15 LAB — TSH: TSH: 0.763 u[IU]/mL (ref 0.350–4.500)

## 2016-11-15 LAB — APTT: APTT: 122 s — AB (ref 24–36)

## 2016-11-15 SURGERY — LEFT HEART CATH AND CORONARY ANGIOGRAPHY
Anesthesia: IV Sedation (MBSC Only)

## 2016-11-15 MED ORDER — ACETAMINOPHEN 325 MG PO TABS
650.0000 mg | ORAL_TABLET | ORAL | Status: DC | PRN
  Administered 2016-11-15: 650 mg via ORAL

## 2016-11-15 MED ORDER — SODIUM CHLORIDE 0.9 % WEIGHT BASED INFUSION: 1.0000 mL/kg/h | INTRAVENOUS | Status: AC

## 2016-11-15 MED ORDER — SODIUM CHLORIDE 0.9% FLUSH
3.0000 mL | Freq: Two times a day (BID) | INTRAVENOUS | Status: DC
  Administered 2016-11-15: 3 mL via INTRAVENOUS

## 2016-11-15 MED ORDER — ZOLPIDEM TARTRATE 5 MG PO TABS: 5.0000 mg | ORAL_TABLET | Freq: Every evening | ORAL | Status: DC | PRN

## 2016-11-15 MED ORDER — NITROGLYCERIN 0.4 MG SL SUBL: 0.4000 mg | SUBLINGUAL_TABLET | SUBLINGUAL | Status: DC | PRN

## 2016-11-15 MED ORDER — SODIUM CHLORIDE 0.9% FLUSH: 3.0000 mL | INTRAVENOUS | Status: DC | PRN

## 2016-11-15 MED ORDER — PRAVASTATIN SODIUM 20 MG PO TABS: 40.0000 mg | ORAL_TABLET | Freq: Every day | ORAL | Status: DC

## 2016-11-15 MED ORDER — ACETAMINOPHEN 325 MG PO TABS: 650.0000 mg | ORAL_TABLET | ORAL | Status: DC | PRN

## 2016-11-15 MED ORDER — SODIUM CHLORIDE 0.9 % IV SOLN: 250.0000 mL | INTRAVENOUS | Status: DC | PRN

## 2016-11-15 MED ORDER — INSULIN ASPART 100 UNIT/ML ~~LOC~~ SOLN: 0.0000 [IU] | Freq: Every day | SUBCUTANEOUS | Status: DC

## 2016-11-15 MED ORDER — ONDANSETRON HCL 4 MG/2ML IJ SOLN: 4.0000 mg | Freq: Four times a day (QID) | INTRAMUSCULAR | Status: DC | PRN

## 2016-11-15 MED ORDER — ASPIRIN EC 81 MG PO TBEC
81.0000 mg | DELAYED_RELEASE_TABLET | Freq: Every day | ORAL | Status: DC
  Administered 2016-11-15 – 2016-11-16 (×2): 81 mg via ORAL
  Filled 2016-11-15 (×2): qty 1

## 2016-11-15 MED ORDER — MORPHINE SULFATE (PF) 4 MG/ML IV SOLN: 2.0000 mg | INTRAVENOUS | Status: DC | PRN

## 2016-11-15 MED ORDER — MIDAZOLAM HCL 2 MG/2ML IJ SOLN
INTRAMUSCULAR | Status: DC | PRN
  Administered 2016-11-15: 0.5 mg via INTRAVENOUS

## 2016-11-15 MED ORDER — HEPARIN SODIUM (PORCINE) 5000 UNIT/ML IJ SOLN
5000.0000 [IU] | Freq: Three times a day (TID) | INTRAMUSCULAR | Status: DC
  Administered 2016-11-15 – 2016-11-16 (×2): 5000 [IU] via SUBCUTANEOUS
  Filled 2016-11-15 (×2): qty 1

## 2016-11-15 MED ORDER — IOPAMIDOL (ISOVUE-300) INJECTION 61%
INTRAVENOUS | Status: DC | PRN
  Administered 2016-11-15: 105 mL via INTRA_ARTERIAL

## 2016-11-15 MED ORDER — INSULIN ASPART 100 UNIT/ML ~~LOC~~ SOLN: 0.0000 [IU] | Freq: Three times a day (TID) | SUBCUTANEOUS | Status: DC

## 2016-11-15 MED ORDER — GI COCKTAIL ~~LOC~~
30.0000 mL | Freq: Four times a day (QID) | ORAL | Status: DC | PRN
  Filled 2016-11-15: qty 30

## 2016-11-15 MED ORDER — HEPARIN SODIUM (PORCINE) 5000 UNIT/ML IJ SOLN: 5000.0000 [IU] | Freq: Three times a day (TID) | INTRAMUSCULAR | Status: DC

## 2016-11-15 MED ORDER — MIDAZOLAM HCL 2 MG/2ML IJ SOLN
INTRAMUSCULAR | Status: AC
  Filled 2016-11-15: qty 2

## 2016-11-15 MED ORDER — FENTANYL CITRATE (PF) 100 MCG/2ML IJ SOLN
INTRAMUSCULAR | Status: AC
  Filled 2016-11-15: qty 2

## 2016-11-15 MED ORDER — METOPROLOL TARTRATE 25 MG PO TABS
12.5000 mg | ORAL_TABLET | Freq: Two times a day (BID) | ORAL | Status: DC
  Administered 2016-11-15 – 2016-11-16 (×4): 12.5 mg via ORAL
  Filled 2016-11-15 (×4): qty 1

## 2016-11-15 MED ORDER — HEPARIN (PORCINE) IN NACL 2-0.9 UNIT/ML-% IJ SOLN
INTRAMUSCULAR | Status: AC
Start: 2016-11-15 — End: 2016-11-15
  Filled 2016-11-15: qty 1000

## 2016-11-15 MED ORDER — DONEPEZIL HCL 5 MG PO TABS
10.0000 mg | ORAL_TABLET | Freq: Every day | ORAL | Status: DC
  Administered 2016-11-15: 10 mg via ORAL
  Filled 2016-11-15: qty 2

## 2016-11-15 MED ORDER — SODIUM CHLORIDE 0.9 % WEIGHT BASED INFUSION: 1.0000 mL/kg/h | INTRAVENOUS | Status: DC

## 2016-11-15 MED ORDER — SODIUM CHLORIDE 0.9% FLUSH: 3.0000 mL | Freq: Two times a day (BID) | INTRAVENOUS | Status: DC

## 2016-11-15 MED ORDER — SEVELAMER CARBONATE 800 MG PO TABS
800.0000 mg | ORAL_TABLET | Freq: Three times a day (TID) | ORAL | Status: DC
  Administered 2016-11-16 (×2): 800 mg via ORAL
  Filled 2016-11-15 (×2): qty 1

## 2016-11-15 MED ORDER — HEPARIN (PORCINE) IN NACL 100-0.45 UNIT/ML-% IJ SOLN
1050.0000 [IU]/h | INTRAMUSCULAR | Status: DC
  Administered 2016-11-15: 1050 [IU]/h via INTRAVENOUS
  Filled 2016-11-15: qty 250

## 2016-11-15 MED ORDER — PANTOPRAZOLE SODIUM 40 MG PO TBEC
40.0000 mg | DELAYED_RELEASE_TABLET | Freq: Every day | ORAL | Status: DC
  Administered 2016-11-15 – 2016-11-16 (×2): 40 mg via ORAL
  Filled 2016-11-15 (×2): qty 1

## 2016-11-15 MED ORDER — SODIUM CHLORIDE 0.9% FLUSH
3.0000 mL | Freq: Two times a day (BID) | INTRAVENOUS | Status: DC
  Administered 2016-11-15 – 2016-11-16 (×2): 3 mL via INTRAVENOUS

## 2016-11-15 MED ORDER — SODIUM CHLORIDE 0.9 % WEIGHT BASED INFUSION: 3.0000 mL/kg/h | INTRAVENOUS | Status: AC

## 2016-11-15 MED ORDER — HEPARIN BOLUS VIA INFUSION
4000.0000 [IU] | Freq: Once | INTRAVENOUS | Status: AC
  Administered 2016-11-15: 4000 [IU] via INTRAVENOUS
  Filled 2016-11-15: qty 4000

## 2016-11-15 MED ORDER — ASPIRIN 81 MG PO CHEW: 81.0000 mg | CHEWABLE_TABLET | ORAL | Status: DC

## 2016-11-15 MED ORDER — FENTANYL CITRATE (PF) 100 MCG/2ML IJ SOLN
INTRAMUSCULAR | Status: DC | PRN
  Administered 2016-11-15: 25 ug via INTRAVENOUS

## 2016-11-15 SURGICAL SUPPLY — 11 items
CATH 5FR JL4 DIAGNOSTIC (CATHETERS) ×2 IMPLANT
CATH INFINITI 5 FR JL6.0 (CATHETERS) ×2 IMPLANT
CATH INFINITI 5FR ANG PIGTAIL (CATHETERS) ×2 IMPLANT
CATH INFINITI 5FR JL5 (CATHETERS) ×2 IMPLANT
CATH INFINITI JR4 5F (CATHETERS) ×2 IMPLANT
DEVICE CLOSURE MYNXGRIP 5F (Vascular Products) ×2 IMPLANT
KIT MANI 3VAL PERCEP (MISCELLANEOUS) ×2 IMPLANT
NEEDLE PERC 18GX7CM (NEEDLE) ×2 IMPLANT
PACK CARDIAC CATH (CUSTOM PROCEDURE TRAY) ×2 IMPLANT
SHEATH AVANTI 5FR X 11CM (SHEATH) ×2 IMPLANT
WIRE EMERALD 3MM-J .035X150CM (WIRE) ×2 IMPLANT

## 2016-11-15 NOTE — ED Notes (Signed)
Jeanina answered call bell

## 2016-11-15 NOTE — Progress Notes (Signed)
ANTICOAGULATION CONSULT NOTE - Initial Consult  Pharmacy Consult for heparin Indication: chest pain/ACS  No Known Allergies  Patient Measurements: Height: 6' 3"  (190.5 cm) Weight: 184 lb (83.5 kg) IBW/kg (Calculated) : 84.5 Heparin Dosing Weight: 83.5 kg  Vital Signs: Temp: 98.6 F (37 C) (01/05 0536) Temp Source: Oral (01/05 0536) BP: 122/57 (01/05 0536) Pulse Rate: 75 (01/05 0536)  Labs:  Recent Labs  11/14/16 1934 11/15/16 0258  HGB 11.0* 10.1*  HCT 32.6* 29.3*  PLT 153 131*  CREATININE 5.78* 5.66*  TROPONINI 0.03* 0.40*    Estimated Creatinine Clearance: 11.9 mL/min (by C-G formula based on SCr of 5.66 mg/dL (H)).   Medical History: Past Medical History:  Diagnosis Date  . Cancer ([place])   . Chronic kidney disease   . COPD (chronic obstructive pulmonary disease) ([place])   . Coronary artery disease   . GERD (gastroesophageal reflux disease)   . Multiple myeloma ([place])   . Myocardial infarction 2005  . Neuropathy ([place])   . Shortness of breath dyspnea   . Stroke (cerebrum) ([facility])    weakness Lt hand    Medications:  Infusions:  . heparin      Assessment: 84 yom cc CP with rising troponin. Pharmacy consulted to dose UFH for ACS. No PO AC at home per Cardiology note on 06/06/16 due to bleeding risk. PTA list does not indicate AC agent.  Goal of Therapy:  Heparin level 0.3-0.7 units/ml Monitor platelets by anticoagulation protocol: Yes   Plan:  Give 4000 units bolus x 1 Start heparin infusion at 1050 units/hr Check anti-Xa level in 8 hours and daily while on heparin Continue to monitor H&H and platelets  Laural Benes, Pharm.D., BCPS Clinical Pharmacist 11/15/2016,5:45 AM

## 2016-11-15 NOTE — Progress Notes (Signed)
This note also relates to the following rows which could not be included: Pulse Rate - Cannot attach notes to unvalidated device data Resp - Cannot attach notes to unvalidated device data SpO2 - Cannot attach notes to unvalidated device data  PRN tylenol given due to complaints of back pain.Patient stated he has been laying flat in bed for several hours and now his back is starting to ache.

## 2016-11-15 NOTE — Progress Notes (Signed)
Pre-HD tx.Patient arrived post cardiac catherization in no distress.

## 2016-11-15 NOTE — Care Management Obs Status (Signed)
[place] NOTIFICATION   Patient Details  Name: Frank Huang MRN: [id number] Date of Birth: [birth year]   Medicare Observation Status Notification Given:  Yes    Katrina Stack, RN 11/15/2016, 10:10 AM

## 2016-11-15 NOTE — Progress Notes (Signed)
Hemodialysis completed. 

## 2016-11-15 NOTE — Progress Notes (Signed)
Post hd tx 

## 2016-11-15 NOTE — Progress Notes (Signed)
Pre-hd tx 

## 2016-11-15 NOTE — H&P (Signed)
History and Physical   [facility] - [place] @ [facility] Admission History and Physical [employer], D.O.    Patient Name: Frank Huang MR#: [id number] Date of Birth: 07/30/1932 Date of Admission: 11/14/2016  Referring MD/NP/PA: Dr. Corky Downs Primary Care Physician: Wilhemena Durie, MD Outpatient Specialists: Dr. Mike Gip, Dr. Ubaldo Glassing  Patient coming from: Home  Chief Complaint: Chest pain  HPI: Frank Huang is a 83 y.o. male with a known history of coronary artery disease status post MI and CVA, multiple myeloma, COPD, end-stage renal disease on hemodialysis atrial fibrillation not on anticoagulation secondary to risk of GI bleed presents to the emergency department for evaluation of pain.  Patient was in a usual state of health until this afternoon when he experienced the sudden onset of central chest pressure described as "a man standing on his chest." He reports associated mild shortness of breath but denies diaphoresis, nausea, vomiting, palpitations, dizziness or lightheadedness. He reports similar symptoms with his MI in 2005.Marland Kitchen He states that his symptoms have since resolved and he is currently chest pain-free.   Otherwise there has been no change in status. Patient has been taking medication as prescribed and there has been no recent change in medication or diet.  There has been no recent illness, travel or sick contacts.    Patient denies fevers/chills, weakness, dizziness, chest pain, shortness of breath, N/V/C/D, abdominal pain, dysuria/frequency, changes in mental status.   Review of Systems:  CONSTITUTIONAL: No fever/chills, fatigue, weakness, weight gain/loss, headache. EYES: No blurry or double vision. ENT: No tinnitus, postnasal drip, redness or soreness of the oropharynx. RESPIRATORY: No cough, dyspnea, wheeze, hemoptysis.  CARDIOVASCULAR: Positive chest pain, negative palpitations, syncope, orthopnea,  GASTROINTESTINAL: No nausea, vomiting, abdominal pain, constipation,  diarrhea.  No hematemesis, melena or hematochezia. GENITOURINARY: No dysuria, frequency, hematuria. ENDOCRINE: No polyuria or nocturia. No heat or cold intolerance. HEMATOLOGY: No anemia, bruising, bleeding. INTEGUMENTARY: No rashes, ulcers, lesions. MUSCULOSKELETAL: No arthritis, gout, dyspnea.  NEUROLOGIC: No numbness, tingling, ataxia, seizure-type activity, weakness. PSYCHIATRIC: No anxiety, depression, insomnia.   Past Medical History:  Diagnosis Date  . Cancer ([place])   . Chronic kidney disease   . COPD (chronic obstructive pulmonary disease) ([place])   . Coronary artery disease   . GERD (gastroesophageal reflux disease)   . Multiple myeloma ([place])   . Myocardial infarction 2005  . Neuropathy ([place])   . Shortness of breath dyspnea   . Stroke (cerebrum) (HCC)    weakness Lt hand    Past Surgical History:  Procedure Laterality Date  . APPENDECTOMY    . AV FISTULA PLACEMENT Left   . CHOLECYSTECTOMY    . SHOULDER ARTHROSCOPY WITH OPEN ROTATOR CUFF REPAIR Left 09/19/2015   Procedure: SHOULDER ARTHROSCOPY , subacromial decompression, debridement;  Surgeon: Corky Mull, MD;  Location: [facility] ORS;  Service: Orthopedics;  Laterality: Left;  . TOTAL HIP ARTHROPLASTY Left      reports that he has been smoking Cigarettes.  He has been smoking about 0.50 packs per day. He has never used smokeless tobacco. He reports that he does not drink alcohol or use drugs.  No Known Allergies  Family History  Problem Relation Age of Onset  . Alzheimer's disease Mother   . Kidney failure Father   . Bone cancer Sister   . Stomach cancer Sister    Family history has been reviewed and confirmed with patient.   Prior to Admission medications   Medication Sig Start Date End Date Taking? Authorizing Provider  aspirin EC  81 MG tablet Take 81 mg by mouth daily.   Yes Historical Provider, MD  Cyanocobalamin (VITAMIN B 12 PO) Take by mouth.   Yes Historical Provider, MD  donepezil (ARICEPT) 10 MG tablet  Take 1 tablet (10 mg total) by mouth at bedtime. 01/02/16  Yes Richard Maceo Pro., MD  lidocaine-prilocaine (EMLA) cream  04/18/16  Yes Historical Provider, MD  lovastatin (MEVACOR) 40 MG tablet TAKE 1 TABLET BY MOUTH EVERY DAY FOR HIGH CHOLESTEROL 08/10/14  Yes Historical Provider, MD  Multiple Vitamin (MULTI-VITAMINS) TABS Take by mouth. Reported on 04/01/2016   Yes Historical Provider, MD  omeprazole (PRILOSEC) 20 MG capsule TAKE 1 CAPSULE BY MOUTH  DAILY. 11/05/16  Yes Richard Maceo Pro., MD  RENVELA 800 MG tablet Take 800 mg by mouth 3 (three) times daily. 11/05/16  Yes Historical Provider, MD  predniSONE (STERAPRED UNI-PAK 21 TAB) 10 MG (21) TBPK tablet Take as directed Patient not taking: Reported on 11/14/2016 11/12/16   [name]., MD    Physical Exam: Vitals:   11/15/16 0030 11/15/16 0130 11/15/16 0200 11/15/16 0209  BP: 132/71   115/65  Pulse: 82 79 80 82  Resp: 19 16 17 18   Temp:      TempSrc:      SpO2: 96% 99% 96% 93%  Weight:      Height:        GENERAL: 83 y.o.-year-old White male patient, well-developed, well-nourished lying in the bed in no acute distress.  Pleasant and cooperative.   HEENT: Head atraumatic, normocephalic. Pupils equal, round, reactive to light and accommodation. No scleral icterus. Extraocular muscles intact. Nares are patent. Oropharynx is clear. Mucus membranes moist. NECK: Supple, full range of motion. No JVD, no bruit heard. No thyroid enlargement, no tenderness, no cervical lymphadenopathy. CHEST: Normal breath sounds bilaterally. No wheezing, rales, rhonchi or crackles. No use of accessory muscles of respiration.  No reproducible chest wall tenderness.  CARDIOVASCULAR: S1, S2 normal. No murmurs, rubs, or gallops. Cap refill <2 seconds. Pulses intact distally.  ABDOMEN: Soft, nondistended, nontender, . No rebound, guarding, rigidity. Normoactive bowel sounds present in all four quadrants. No organomegaly or mass. EXTREMITIES: Mild pitting  edema bilateral lower extremities to the ankles. Left upper extremity AV graft with positive thrill NEUROLOGIC: Cranial nerves II through XII are grossly intact with no focal sensorimotor deficit. Muscle strength 5/5 in all extremities. Sensation intact. Gait not checked. PSYCHIATRIC: The patient is alert and oriented x 3. Normal affect, mood, thought content. SKIN: Warm, dry, and intact without obvious rash, lesion, or ulcer. Multiple keratoses diffusely over face neck and chest.   Labs on Admission: I have personally reviewed following labs and imaging studies  CBC:  Recent Labs Lab 11/14/16 1934  WBC 9.0  HGB 11.0*  HCT 32.6*  MCV 100.6*  PLT 035   Basic Metabolic Panel:  Recent Labs Lab 11/14/16 1934  NA 139  K 4.0  CL 94*  CO2 32  GLUCOSE 224*  BUN 62*  CREATININE 5.78*  CALCIUM 9.6   GFR: Estimated Creatinine Clearance: 11.6 mL/min (by C-G formula based on SCr of 5.78 mg/dL (H)). Liver Function Tests: No results for input(s): AST, ALT, ALKPHOS, BILITOT, PROT, ALBUMIN in the last 168 hours. No results for input(s): LIPASE, AMYLASE in the last 168 hours. No results for input(s): AMMONIA in the last 168 hours. Coagulation Profile: No results for input(s): INR, PROTIME in the last 168 hours. Cardiac Enzymes:  Recent Labs Lab 11/14/16 1934  TROPONINI 0.03*   BNP (last 3 results) No results for input(s): PROBNP in the last 8760 hours. HbA1C: No results for input(s): HGBA1C in the last 72 hours. CBG: No results for input(s): GLUCAP in the last 168 hours. Lipid Profile: No results for input(s): CHOL, HDL, LDLCALC, TRIG, CHOLHDL, LDLDIRECT in the last 72 hours. Thyroid Function Tests: No results for input(s): TSH, T4TOTAL, FREET4, T3FREE, THYROIDAB in the last 72 hours. Anemia Panel: No results for input(s): VITAMINB12, FOLATE, FERRITIN, TIBC, IRON, RETICCTPCT in the last 72 hours. Urine analysis:    Component Value Date/Time   COLORURINE Yellow 12/12/2013  1501   APPEARANCEUR Cloudy 12/12/2013 1501   LABSPEC 1.014 12/12/2013 1501   PHURINE 5.0 12/12/2013 1501   GLUCOSEU Negative 12/12/2013 1501   HGBUR 2+ 12/12/2013 1501   BILIRUBINUR Negative 12/12/2013 1501   KETONESUR Negative 12/12/2013 1501   PROTEINUR 100 mg/dL 12/12/2013 1501   NITRITE Negative 12/12/2013 1501   LEUKOCYTESUR 2+ 12/12/2013 1501   Sepsis Labs: @LABRCNTIP (procalcitonin:4,lacticidven:4) )No results found for this or any previous visit (from the past 240 hour(s)).   Radiological Exams on Admission: Dg Chest 2 View  Result Date: 11/14/2016 CLINICAL DATA:  Chest pain. EXAM: CHEST  2 VIEW COMPARISON:  Radiographs of October 11, 2016. FINDINGS: The heart size and mediastinal contours are within normal limits. Both lungs are clear. Atherosclerosis of thoracic aorta is noted. No pneumothorax or pleural effusion is noted. The visualized skeletal structures are unremarkable. IMPRESSION: No active cardiopulmonary disease. Electronically Signed   By: Marijo Conception, M.D.   On: 11/14/2016 19:58    EKG: Atrial fibrillation at 111 bpm with normal axis and nonspecific ST-T wave changes.   Assessment/Plan Active Problems:   Chest pain, rule out acute myocardial infarction    This is a 83 y.o. male with a history of coronary artery disease status post MI and CVA, multiple myeloma, COPD, GERD, memory loss end-stage renal disease on hemodialysis, persistent atrial fibrillation not on anticoagulation secondary to risk of GI bleed now being admitted with: 1. Chest pain, rule out ACS - Admit to observation with telemetry monitoring. - Trend troponins, check lipids and TSH. - Morphine, nitro, aspirin and statin ordered.  Metoprolol added twice a day - Cardiology consultation has been requested.  2. Hyperglycemia with no documented history of diabetes, may be due to steroid use. -Accu-Cheks with regular insulin sliding scale coverage and check hemoglobin A1c  2. History of end-stage  renal disease on hemodialysis-continue Renvela. Nephrology consultation has been requested as patient is hemodialysis on Monday Wednesday and Friday. 3. History of memory loss-continue Aricept 4. History of GERD-continue Prilosec 5. History of COPD-continuous pulse oximetry and duo nebs if needed and  6. Persistent atrial fibrillation, rate controlled-beta blocker added twice a day  Admission status: Observation, telemetry IV Fluids: Hep-Lock Diet/Nutrition: Heart healthy, carb controlled Consults called: Cardiology  DVT Px: Heparin, SCDs and early ambulation Code Status: Full Code  Disposition Plan: To home in less than 24 hours   All the records are reviewed and case discussed with ED provider. Management plans discussed with the patient and/or family who express understanding and agree with plan of care.  Diann Bangerter D.O. on 11/15/2016 at 2:20 AM Between 7am to 6pm - Pager - [phone] After 6pm go to [email or web address] - [employer] [facility] [place] [facility] Office [phone] CC: Primary care physician; Wilhemena Durie, MD  Harvie Bridge MD [facility] Pager [phone]   If 7PM-7AM, please contact night-coverage [email or web address]  Password [email or web address]  11/15/2016, 2:20 AM

## 2016-11-15 NOTE — Progress Notes (Signed)
Arrival Method: via stretcher with ED staff Mental Orientation:?A&O x 4 Telemetry:  verified by Levester Fresh, RN Skin:?Intact, Verified by Levester Fresh, RN IV:  20g righ tAC Pain: No complaints of pain Tubes: no attachments  Safety Measures: Safety Fall Prevention Plan has been given, discussed & signed, non skid socks in place, patient has been orientated to the room, unit & staff.  Family:?No family present at bedside Orders have been reviewed & implemented. Will continue to monitor the patient. Call light has been placed within reach.

## 2016-11-15 NOTE — Consult Note (Signed)
[facility] Cardiology  CARDIOLOGY CONSULT NOTE  Patient ID: Frank Huang MRN: [id number] DOB/AGE: 1932-01-26 83 y.o.  Admit date: 11/14/2016 Referring Physician Gouru  Primary Physician Rosanna Randy Primary Cardiologist Fath Reason for Consultation Chest pain  HPI: 83 year old male referred for evaluation of chest pressure. Patient has a history of MI in 2005, CVA, COPD, end-stage renal disease on hemodialysis, and atrial fibrillation, not on anticoagulation secondary to GI bleed. Patient [facility] to [facility] ER on 11/14/2016 for acute onset of diffuse chest pressure that felt like someone standing on his chest that occurred at rest with left arm weakness without associated shortness of breath, dizziness, lightheadedness, palpitations, nausea, or diaphoresis. ECG in the ER was notable for atrial fibrillation at a rate of 111 bpm with nonspecific ST-T wave changes. Admission labs notable for elevated serial troponin, with the latest being 0.77. Currently, the patient denies chest pain, shortness of breath, lower extremity edema, but does report fatigue.   Review of systems complete and found to be negative unless listed above     Past Medical History:  Diagnosis Date  . Cancer ([place])   . Chronic kidney disease   . COPD (chronic obstructive pulmonary disease) ([place])   . Coronary artery disease   . GERD (gastroesophageal reflux disease)   . Multiple myeloma ([place])   . Myocardial infarction 2005  . Neuropathy ([place])   . Shortness of breath dyspnea   . Stroke (cerebrum) ([facility])    weakness Lt hand    Past Surgical History:  Procedure Laterality Date  . APPENDECTOMY    . AV FISTULA PLACEMENT Left   . CHOLECYSTECTOMY    . SHOULDER ARTHROSCOPY WITH OPEN ROTATOR CUFF REPAIR Left 09/19/2015   Procedure: SHOULDER ARTHROSCOPY , subacromial decompression, debridement;  Surgeon: Corky Mull, MD;  Location: [facility] ORS;  Service: Orthopedics;  Laterality: Left;  . TOTAL HIP ARTHROPLASTY Left     Prescriptions Prior to  Admission  Medication Sig Dispense Refill Last Dose  . aspirin EC 81 MG tablet Take 81 mg by mouth daily.   UNKNOWN at UNKNOWN  . Cyanocobalamin (VITAMIN B 12 PO) Take by mouth.   UNKNOWN at UNKNOWN  . donepezil (ARICEPT) 10 MG tablet Take 1 tablet (10 mg total) by mouth at bedtime. 90 tablet 3 UNKNOWN at UNKNOWN  . lidocaine-prilocaine (EMLA) cream    PRN at PRN  . lovastatin (MEVACOR) 40 MG tablet TAKE 1 TABLET BY MOUTH EVERY DAY FOR HIGH CHOLESTEROL   UNKNOWN at UNKNOWN  . Multiple Vitamin (MULTI-VITAMINS) TABS Take by mouth. Reported on 04/01/2016   UNKNOWN at UNKNOWN  . omeprazole (PRILOSEC) 20 MG capsule TAKE 1 CAPSULE BY MOUTH  DAILY. 90 capsule 3 UNKNOWN at UNKNOWN  . RENVELA 800 MG tablet Take 800 mg by mouth 3 (three) times daily.   UNKNOWN at UNKNOWN  . predniSONE (STERAPRED UNI-PAK 21 TAB) 10 MG (21) TBPK tablet Take as directed (Patient not taking: Reported on 11/14/2016) 21 tablet 0 Completed Course   Social History   Social History  . Marital status: Married    Spouse name: N/A  . Number of children: N/A  . Years of education: N/A   Occupational History  . Not on file.   Social History Main Topics  . Smoking status: Current Every Day Smoker    Packs/day: 0.50    Types: Cigarettes  . Smokeless tobacco: Never Used     Comment: smokes about 3 cigarettes per week  . Alcohol use No  . Drug use: No  .  Sexual activity: Not on file   Other Topics Concern  . Not on file   Social History Narrative  . No narrative on file    Family History  Problem Relation Age of Onset  . Alzheimer's disease Mother   . Kidney failure Father   . Bone cancer Sister   . Stomach cancer Sister       Review of systems complete and found to be negative unless listed above      PHYSICAL EXAM  General: Well developed, well nourished, in no acute distress HEENT:  Normocephalic and atramatic Neck:  No JVD.  Lungs: Clear bilaterally to auscultation and percussion. Heart: HRRR . 2/6  systolic murmur Abdomen: Bowel sounds are positive, abdomen soft and non-tender  Msk:  Patient sitting up in bed Extremities: No clubbing, cyanosis or edema.   Neuro: Alert and oriented X 3. Psych:  Good affect, responds appropriately  Labs:   Lab Results  Component Value Date   WBC 9.2 11/15/2016   HGB 10.1 (L) 11/15/2016   HCT 29.3 (L) 11/15/2016   MCV 100.1 (H) 11/15/2016   PLT 131 (L) 11/15/2016    Recent Labs Lab 11/14/16 1934 11/15/16 0258  NA 139  --   K 4.0  --   CL 94*  --   CO2 32  --   BUN 62*  --   CREATININE 5.78* 5.66*  CALCIUM 9.6  --   GLUCOSE 224*  --    Lab Results  Component Value Date   CKTOTAL 809 (H) 12/11/2013   CKMB 1.8 12/11/2013   TROPONINI 0.77 (HH) 11/15/2016    Lab Results  Component Value Date   CHOL 128 11/15/2016   CHOL 126 10/11/2016   CHOL 163 08/30/2015   Lab Results  Component Value Date   HDL 43 11/15/2016   HDL 50 10/11/2016   HDL 43 08/30/2015   Lab Results  Component Value Date   LDLCALC 73 11/15/2016   LDLCALC 62 10/11/2016   LDLCALC 104 (H) 08/30/2015   Lab Results  Component Value Date   TRIG 58 11/15/2016   TRIG 71 10/11/2016   TRIG 78 08/30/2015   Lab Results  Component Value Date   CHOLHDL 3.0 11/15/2016   No results found for: LDLDIRECT    Radiology: Dg Chest 2 View  Result Date: 11/14/2016 CLINICAL DATA:  Chest pain. EXAM: CHEST  2 VIEW COMPARISON:  Radiographs of October 11, 2016. FINDINGS: The heart size and mediastinal contours are within normal limits. Both lungs are clear. Atherosclerosis of thoracic aorta is noted. No pneumothorax or pleural effusion is noted. The visualized skeletal structures are unremarkable. IMPRESSION: No active cardiopulmonary disease. Electronically Signed   By: Marijo Conception, M.D.   On: 11/14/2016 19:58   Dg Knee Complete 4 Views Right  Result Date: 11/12/2016 CLINICAL DATA:  RIGHT knee pain several days ago. Painful to walk or bear weight. EXAM: RIGHT KNEE - COMPLETE  4+ VIEW COMPARISON:  None. FINDINGS: No evidence of fracture, dislocation, or joint effusion. Mild degenerative change, not unexpected for age, more prominent in the medial compartment. No effusion. Vascular calcification. No focal or worrisome osseous abnormality. IMPRESSION: Mild degenerative change without effusion, fracture, or other significant finding. Electronically Signed   By: Staci Righter M.D.   On: 11/12/2016 16:08    EKG: Atrial fibrillation, rate controlled 75 bpm  ASSESSMENT AND PLAN:  1. History of CAD, status post MI, 2005 with new onset of nonexertional chest pressure with mildly  elevated troponin. 2. History of peristent atrial fibrillation, not on anticoagulation due to GI bleed risk, rate controlled 3. Congestive heart failure with EF 35%, does not appear to be fluid-overloaded  Recommendations: 1. Continue current therapy 2. Defer full-dose anticoagulation for atrial fibrillation due to history of GI bleed 3. Review echocardiogram 4. Cardiac catheterization today with hemodialysis promptly proceeding 5. Further recommendations pending echocardiogram and findings of cardiac cath   Signed: Anna Drane, PA-C 11/15/2016, 9:19 AM      

## 2016-11-15 NOTE — Progress Notes (Signed)
A & O but at times can be forgetful. Urinal. Room air. NSR. Wife at the bedside. Pt has not reported any pain. Takes meds ok. Pt has no further concerns at this time.

## 2016-11-15 NOTE — Progress Notes (Signed)
[place] [place] NAME: Frank Huang    MR#:  [id number]  DATE OF BIRTH:  1932-09-05  SUBJECTIVE:  CHIEF COMPLAINT:  Pt was seen and examined  Prior to Cardiac catheterization. He reports some chest tightness denies any shortness of breath  REVIEW OF SYSTEMS:  CONSTITUTIONAL: No fever, fatigue or weakness.  EYES: No blurred or double vision.  EARS, NOSE, AND THROAT: No tinnitus or ear pain.  RESPIRATORY: No cough, shortness of breath, wheezing or hemoptysis.  CARDIOVASCULAR: Reporting chest tightness denies chest pain, orthopnea, edema.  GASTROINTESTINAL: No nausea, vomiting, diarrhea or abdominal pain.  GENITOURINARY: No dysuria, hematuria.  ENDOCRINE: No polyuria, nocturia,  HEMATOLOGY: No anemia, easy bruising or bleeding SKIN: No rash or lesion. MUSCULOSKELETAL: No joint pain or arthritis.   NEUROLOGIC: No tingling, numbness, weakness.  PSYCHIATRY: No anxiety or depression.   DRUG ALLERGIES:  No Known Allergies  VITALS:  Blood pressure 115/65, pulse 70, temperature 98.5 F (36.9 C), temperature source Oral, resp. rate 17, height _0  (1.905 m), weight 83.5 kg (184 lb 1.4 oz), SpO2 97 %.  PHYSICAL EXAMINATION:  GENERAL:  83 y.o.-year-old patient lying in the bed with no acute distress.  EYES: Pupils equal, round, reactive to light and accommodation. No scleral icterus. Extraocular muscles intact.  HEENT: Head atraumatic, normocephalic. Oropharynx and nasopharynx clear.  NECK:  Supple, no jugular venous distention. No thyroid enlargement, no tenderness.  LUNGS: Normal breath sounds bilaterally, no wheezing, rales,rhonchi or crepitation. No use of accessory muscles of respiration.  CARDIOVASCULAR: S1, S2 normal. No murmurs, rubs, or gallops.  ABDOMEN: Soft, nontender, nondistended. Bowel sounds present. No organomegaly or mass.  EXTREMITIES: No pedal edema, cyanosis, or clubbing.  NEUROLOGIC: Cranial nerves II through XII are  intact. Muscle strength 5/5 in all extremities. Sensation intact. Gait not checked.  PSYCHIATRIC: The patient is alert and oriented x 3.  SKIN: No obvious rash, lesion, or ulcer.    LABORATORY PANEL:   CBC  Recent Labs Lab 11/15/16 0258  WBC 9.2  HGB 10.1*  HCT 29.3*  PLT 131*   ------------------------------------------------------------------------------------------------------------------  Chemistries   Recent Labs Lab 11/14/16 1934 11/15/16 0258  NA 139  --   K 4.0  --   CL 94*  --   CO2 32  --   GLUCOSE 224*  --   BUN 62*  --   CREATININE 5.78* 5.66*  CALCIUM 9.6  --    ------------------------------------------------------------------------------------------------------------------  Cardiac Enzymes  Recent Labs Lab 11/15/16 0748  TROPONINI 0.77*   ------------------------------------------------------------------------------------------------------------------  RADIOLOGY:  Dg Chest 2 View  Result Date: 11/14/2016 CLINICAL DATA:  Chest pain. EXAM: CHEST  2 VIEW COMPARISON:  Radiographs of October 11, 2016. FINDINGS: The heart size and mediastinal contours are within normal limits. Both lungs are clear. Atherosclerosis of thoracic aorta is noted. No pneumothorax or pleural effusion is noted. The visualized skeletal structures are unremarkable. IMPRESSION: No active cardiopulmonary disease. Electronically Signed   By: Marijo Conception, M.D.   On: 11/14/2016 19:58    EKG:   Orders placed or performed during the hospital encounter of 11/14/16  . EKG 12-Lead  . EKG 12-Lead  . ED EKG within 10 minutes  . ED EKG within 10 minutes  . EKG 12-Lead (at 6am)  . EKG 12-Lead (Repeat cardiac markers, recurrent chest pain)  . EKG 12-Lead (at 6am)  . EKG 12-Lead (Repeat cardiac markers, recurrent chest pain)  . EKG 12-Lead  . EKG 12-Lead  .  EKG 12-Lead  . EKG 12-Lead    ASSESSMENT AND PLAN:    This is a 83 y.o. male with a history of coronary artery disease  status post MI and CVA, multiple myeloma, COPD, GERD, memory loss end-stage renal disease on hemodialysis, persistent atrial fibrillation not on anticoagulation secondary to risk of GI bleed now being admitted with:   1. Chest pain 2/2 NSTEMI -  - telemetry monitoring. - Trend troponins 0.40-0.58- 0.77  Patient had a cardiac catheterization today results pending  LDL -73  and TSH 0.763 - Morphine, nitro, aspirin and statin ordered.  Metoprolol added twice a day - Follow up with [facility] cardiology for medications  2. Hyperglycemia with no documented history of diabetes, may be due to steroid use. -Accu-Cheks with regular insulin sliding scale coverage and check hemoglobin A1c  2. History of end-stage renal disease on hemodialysis- continue Renvela.  patient is hemodialysis on Monday Wednesday and Friday. Patient went for hemodialysis today following cardiac catheterization  3. History of memory loss-continue Aricept  4. History of GERD-continue Prilosec  5. History of COPD-asthma exacerbation. Continue duo nebs  6. Persistent atrial fibrillation, rate controlled-beta blocker twice a day     All the records are reviewed and case discussed with Care Management/Social Workerr. Management plans discussed with the patient, family and they are in agreement.  CODE STATUS: fc   TOTAL TIME TAKING CARE OF THIS PATIENT: 37 minutes.   POSSIBLE D/C IN am DAYS, DEPENDING ON CLINICAL CONDITION.  Note: This dictation was prepared with Dragon dictation along with smaller phrase technology. Any transcriptional errors that result from this process are unintentional.   Nicholes Mango M.D on 11/15/2016 at 4:48 PM  Between 7am to 6pm - Pager - [phone] After 6pm go to [email or web address] - password [email or web address] [facility]  Office  [phone]  CC: Primary care physician; Wilhemena Durie, MD

## 2016-11-15 NOTE — Progress Notes (Signed)
Subjective:  Patient known to our practice from previous admissions. Last seen in 2015 Presents from home via EMS for chest pain and dizziness His diagnoses and unstable angina and is scheduled for cardiac catheterization today   Objective:  Vital signs in last 24 hours:  Temp:  [97.6 F (36.4 C)-98.6 F (37 C)] 98.4 F (36.9 C) (01/05 1356) Pulse Rate:  [60-102] 74 (01/05 1356) Resp:  [15-23] 16 (01/05 1356) BP: (101-160)/(47-81) 101/69 (01/05 1356) SpO2:  [92 %-99 %] 94 % (01/05 1356) Weight:  [83.5 kg (184 lb)] 83.5 kg (184 lb) (01/05 1356)  Weight change:  Filed Weights   11/14/16 1932 11/15/16 1356  Weight: 83.5 kg (184 lb) 83.5 kg (184 lb)    Intake/Output:    Intake/Output Summary (Last 24 hours) at 11/15/16 1509 Last data filed at 11/15/16 0626  Gross per 24 hour  Intake              120 ml  Output              200 ml  Net              -80 ml     Physical Exam: General: No acute distress, sitting up in bed  HEENT Anicteric, moist mucous membranes, decreased hearing  Neck supple  Pulm/lungs Coarse breath sounds at bases  CVS/Heart Irregular, no rub or gallop  Abdomen:  Soft, nontender  Extremities: Trace edema  Neurologic:  oriented, alert  Skin: No acute rashes  Access: AV fistula       Basic Metabolic Panel:   Recent Labs Lab 11/14/16 1934 11/15/16 0258  NA 139  --   K 4.0  --   CL 94*  --   CO2 32  --   GLUCOSE 224*  --   BUN 62*  --   CREATININE 5.78* 5.66*  CALCIUM 9.6  --      CBC:  Recent Labs Lab 11/14/16 1934 11/15/16 0258  WBC 9.0 9.2  HGB 11.0* 10.1*  HCT 32.6* 29.3*  MCV 100.6* 100.1*  PLT 153 131*      Microbiology:  No results found for this or any previous visit (from the past 720 hour(s)).  Coagulation Studies:  Recent Labs  11/15/16 0748  LABPROT 14.5  INR 1.12    Urinalysis: No results for input(s): COLORURINE, LABSPEC, PHURINE, GLUCOSEU, HGBUR, BILIRUBINUR, KETONESUR, PROTEINUR, UROBILINOGEN,  NITRITE, LEUKOCYTESUR in the last 72 hours.  Invalid input(s): APPERANCEUR    Imaging: Dg Chest 2 View  Result Date: 11/14/2016 CLINICAL DATA:  Chest pain. EXAM: CHEST  2 VIEW COMPARISON:  Radiographs of October 11, 2016. FINDINGS: The heart size and mediastinal contours are within normal limits. Both lungs are clear. Atherosclerosis of thoracic aorta is noted. No pneumothorax or pleural effusion is noted. The visualized skeletal structures are unremarkable. IMPRESSION: No active cardiopulmonary disease. Electronically Signed   By: Marijo Conception, M.D.   On: 11/14/2016 19:58     Medications:   . [START ON 11/16/2016] sodium chloride     Followed by  . [START ON 11/16/2016] sodium chloride    . heparin 1,050 Units/hr (11/15/16 3546)   . [START ON 11/16/2016] aspirin  81 mg Oral Pre-Cath  . [MAR Hold] aspirin EC  81 mg Oral Daily  . [MAR Hold] donepezil  10 mg Oral QHS  . [MAR Hold] insulin aspart  0-15 Units Subcutaneous TID WC  . [MAR Hold] insulin aspart  0-5 Units Subcutaneous QHS  . [  MAR Hold] metoprolol tartrate  12.5 mg Oral BID  . [MAR Hold] pantoprazole  40 mg Oral QAC breakfast  . [MAR Hold] pravastatin  40 mg Oral q1800  . [MAR Hold] sevelamer carbonate  800 mg Oral TID WC  . sodium chloride flush  3 mL Intravenous Q12H   sodium chloride, [MAR Hold] acetaminophen, [MAR Hold] gi cocktail, [MAR Hold]  morphine injection, [MAR Hold] nitroGLYCERIN, [MAR Hold] ondansetron (ZOFRAN) IV, sodium chloride flush, [MAR Hold] zolpidem  Assessment/ Plan:  83 y.o. male with ESRD, multiple myeloma, CAD, CVA, COPD , A fib presents for evaluation of chest pressure and paiin associated with nausea, vomiting and palpitations  [facility]/ MWF/   1. ESRD 2. CAD 3. SHPTH 4. Anemia of CKD  Plan: Dialysis today after cardiac cath Monitor Hgb and Phos    LOS: 0 Nalaysia Manganiello 1/5/20183:09 PM

## 2016-11-15 NOTE — Progress Notes (Signed)
Tolerated 3hour hemodialysis treatment,no adverse effects noted.1.5liters net removal.Hemastasis achieved,sites dressed with gauze/taped.

## 2016-11-15 NOTE — Care Management (Signed)
Patient is an established patient with [facility] on [facility].  Wife transports.  No issues.  Notified Cheryl with [facility].

## 2016-11-15 NOTE — Progress Notes (Signed)
Report called to floor nurse. Pt to go to dialysis before returning to floor. Pt alert and oriented skin warm and dry resp regular and unlabored. Right groin dressing dry and intact. No co pain except for normal back discomfort states pt. Pt tol procedure well.  No complaints.

## 2016-11-15 NOTE — Progress Notes (Signed)
Dr Estanislado Pandy notified of elevated troponin. Awaiting orders.

## 2016-11-15 NOTE — Progress Notes (Signed)
Hemodialysis treatment started without complications.Access secured/visible.Dialyzing in bed. 

## 2016-11-15 NOTE — Progress Notes (Signed)
Dr Harley Hallmark in to see pt. Discussed results. Family not in [place].

## 2016-11-15 NOTE — Progress Notes (Signed)
MD Paraschos was notified that pt ate some breakfast. Order was to keep pt NPO for cardiac cath.

## 2016-11-16 LAB — GLUCOSE, CAPILLARY: Glucose-Capillary: 89 mg/dL (ref 65–99)

## 2016-11-16 LAB — HEMOGLOBIN A1C
HEMOGLOBIN A1C: 5.1 % (ref 4.8–5.6)
MEAN PLASMA GLUCOSE: 100 mg/dL

## 2016-11-16 MED ORDER — METOPROLOL TARTRATE 25 MG PO TABS: 12.5000 mg | ORAL_TABLET | Freq: Two times a day (BID) | ORAL | 0 refills | Status: DC

## 2016-11-16 NOTE — Discharge Summary (Signed)
[place] at [place] NAME: Frank Huang    MR#:  [id number]  DATE OF BIRTH:  09/17/1932  DATE OF ADMISSION:  11/14/2016 ADMITTING PHYSICIAN: Harvie Bridge, DO  DATE OF DISCHARGE: 11/16/2016  PRIMARY CARE PHYSICIAN: Wilhemena Durie, MD    ADMISSION DIAGNOSIS:  Chest pain, unspecified type [R07.9]  DISCHARGE DIAGNOSIS:  Active Problems:   Chest pain, rule out acute myocardial infarction   NSTEMI ruled out   SECONDARY DIAGNOSIS:   Past Medical History:  Diagnosis Date  . Cancer ([place])   . Chronic kidney disease   . COPD (chronic obstructive pulmonary disease) ([place])   . Coronary artery disease   . GERD (gastroesophageal reflux disease)   . Multiple myeloma ([place])   . Myocardial infarction 2005  . Neuropathy ([place])   . Shortness of breath dyspnea   . Stroke (cerebrum) ([facility])    weakness Lt hand    HOSPITAL COURSE:   This is a 83 y.o.malewith a history of coronary artery disease status post MI and CVA, multiple myeloma, COPD, GERD, memory lossend-stage renal disease on hemodialysis, persistent atrial fibrillation not on anticoagulation secondary to risk of GI bleednow being admitted with:   1. Chest pain 2/2 NSTEMI -  - telemetry monitoring. - Trend troponins 0.40-0.58- 0.77  Patient had a cardiac catheterization - showed 60-7-% blockage- suggested medication management. LDL -73  and TSH 0.763 - Morphine, nitro, aspirin and statin ordered. Metoprolol added twice a day - Follow up with [facility] cardiology for medications  2. Hyperglycemia with no documented history of diabetes, may be due to steroid use. -Accu-Cheks with regular insulin sliding scale coverage HbA1c- 5.1.  2. History of end-stage renal disease on hemodialysis- continue Renvela.  patient is hemodialysis on Monday Wednesday and Friday.  3. History of memory loss-continue Aricept  4. History of GERD-continue Prilosec  5. History of COPD-asthma  exacerbation. Continue duo nebs  6. Persistent atrial fibrillation, rate controlled-beta blocker twice a day    DISCHARGE CONDITIONS:   Stable.  CONSULTS OBTAINED:  Treatment Team:  Murlean Iba, MD Isaias Cowman, MD  DRUG ALLERGIES:  No Known Allergies  DISCHARGE MEDICATIONS:   Current Discharge Medication List    START taking these medications   Details  metoprolol tartrate (LOPRESSOR) 25 MG tablet Take 0.5 tablets (12.5 mg total) by mouth 2 (two) times daily. Qty: 60 tablet, Refills: 0      CONTINUE these medications which have NOT CHANGED   Details  aspirin EC 81 MG tablet Take 81 mg by mouth daily.    Cyanocobalamin (VITAMIN B 12 PO) Take by mouth.    donepezil (ARICEPT) 10 MG tablet Take 1 tablet (10 mg total) by mouth at bedtime. Qty: 90 tablet, Refills: 3   Associated Diagnoses: Bad memory; Memory change    lidocaine-prilocaine (EMLA) cream     lovastatin (MEVACOR) 40 MG tablet TAKE 1 TABLET BY MOUTH EVERY DAY FOR HIGH CHOLESTEROL    Multiple Vitamin (MULTI-VITAMINS) TABS Take by mouth. Reported on 04/01/2016    omeprazole (PRILOSEC) 20 MG capsule TAKE 1 CAPSULE BY MOUTH  DAILY. Qty: 90 capsule, Refills: 3    RENVELA 800 MG tablet Take 800 mg by mouth 3 (three) times daily.    predniSONE (STERAPRED UNI-PAK 21 TAB) 10 MG (21) TBPK tablet Take as directed Qty: 21 tablet, Refills: 0         DISCHARGE INSTRUCTIONS:    Follow with Cardiologist in 1-2 weeks.  If you  experience worsening of your admission symptoms, develop shortness of breath, life threatening emergency, suicidal or homicidal thoughts you must seek medical attention immediately by calling 911 or calling your MD immediately  if symptoms less severe.  You Must read complete instructions/literature along with all the possible adverse reactions/side effects for all the Medicines you take and that have been prescribed to you. Take any new Medicines after you have completely  understood and accept all the possible adverse reactions/side effects.   Please note  You were cared for by a hospitalist during your hospital stay. If you have any questions about your discharge medications or the care you received while you were in the hospital after you are discharged, you can call the unit and asked to speak with the hospitalist on call if the hospitalist that took care of you is not available. Once you are discharged, your primary care physician will handle any further medical issues. Please note that NO REFILLS for any discharge medications will be authorized once you are discharged, as it is imperative that you return to your primary care physician (or establish a relationship with a primary care physician if you do not have one) for your aftercare needs so that they can reassess your need for medications and monitor your lab values.    Today   CHIEF COMPLAINT:   Chief Complaint  Patient presents with  . Chest Pain    HISTORY OF PRESENT ILLNESS:  Frank Huang  is a 83 y.o. male with a known history of coronary artery disease status post MI and CVA, multiple myeloma, COPD, end-stage renal disease on hemodialysis atrial fibrillation not on anticoagulation secondary to risk of GI bleed presents to the emergency department for evaluation of pain.  Patient was in a usual state of health until this afternoon when he experienced the sudden onset of central chest pressure described as "a man standing on his chest." He reports associated mild shortness of breath but denies diaphoresis, nausea, vomiting, palpitations, dizziness or lightheadedness. He reports similar symptoms with his MI in 2005.Marland Kitchen He states that his symptoms have since resolved and he is currently chest pain-free.   Otherwise there has been no change in status. Patient has been taking medication as prescribed and there has been no recent change in medication or diet.  There has been no recent illness, travel or sick  contacts.    Patient denies fevers/chills, weakness, dizziness, chest pain, shortness of breath, N/V/C/D, abdominal pain, dysuria/frequency, changes in mental status.    VITAL SIGNS:  Blood pressure 106/63, pulse 65, temperature 98 F (36.7 C), temperature source Oral, resp. rate 20, height _0  (1.905 m), weight 82 kg (180 lb 12.4 oz), SpO2 96 %.  I/O:   Intake/Output Summary (Last 24 hours) at 11/16/16 1246 Last data filed at 11/16/16 1227  Gross per 24 hour  Intake              243 ml  Output             1500 ml  Net            -1257 ml    PHYSICAL EXAMINATION:   GENERAL:  83 y.o.-year-old patient lying in the bed with no acute distress.  EYES: Pupils equal, round, reactive to light and accommodation. No scleral icterus. Extraocular muscles intact.  HEENT: Head atraumatic, normocephalic. Oropharynx and nasopharynx clear.  NECK:  Supple, no jugular venous distention. No thyroid enlargement, no tenderness.  LUNGS: Normal breath sounds bilaterally,  no wheezing, rales,rhonchi or crepitation. No use of accessory muscles of respiration.  CARDIOVASCULAR: S1, S2 normal. No murmurs, rubs, or gallops.  ABDOMEN: Soft, nontender, nondistended. Bowel sounds present. No organomegaly or mass.  EXTREMITIES: No pedal edema, cyanosis, or clubbing.  NEUROLOGIC: Cranial nerves II through XII are intact. Muscle strength 5/5 in all extremities. Sensation intact. Gait not checked.  PSYCHIATRIC: The patient is alert and oriented x 3.  SKIN: No obvious rash, lesion, or ulcer.   DATA REVIEW:   CBC  Recent Labs Lab 11/15/16 0258  WBC 9.2  HGB 10.1*  HCT 29.3*  PLT 131*    Chemistries   Recent Labs Lab 11/14/16 1934 11/15/16 0258  NA 139  --   K 4.0  --   CL 94*  --   CO2 32  --   GLUCOSE 224*  --   BUN 62*  --   CREATININE 5.78* 5.66*  CALCIUM 9.6  --     Cardiac Enzymes  Recent Labs Lab 11/15/16 0748  TROPONINI 0.77*    Microbiology Results  Results for orders  placed or performed in visit on 12/10/13  Influenza A&B Antigens [facility])     Status: None   Collection Time: 12/10/13  8:03 PM  Result Value Ref Range Status   Micro Text Report   Final       COMMENT                   NEGATIVE FOR INFLUENZA A (ANTIGEN ABSENT)   COMMENT                   NEGATIVE FOR INFLUENZA B (ANTIGEN ABSENT)   ANTIBIOTIC                                                      Culture, blood (single)     Status: None   Collection Time: 12/10/13  8:03 PM  Result Value Ref Range Status   Micro Text Report   Final       COMMENT                   NO GROWTH AEROBICALLY/ANAEROBICALLY IN 5 DAYS   ANTIBIOTIC                                                      Culture, blood (single)     Status: None   Collection Time: 12/10/13  8:03 PM  Result Value Ref Range Status   Micro Text Report   Final       COMMENT                   NO GROWTH AEROBICALLY/ANAEROBICALLY IN 5 DAYS   ANTIBIOTIC                                                      Culture, blood (single)     Status: None   Collection Time: 12/12/13  8:48 AM  Result Value Ref Range  Status   Micro Text Report   Final       COMMENT                   NO GROWTH AEROBICALLY/ANAEROBICALLY IN 5 DAYS   ANTIBIOTIC                                                      Urine culture     Status: None   Collection Time: 12/12/13  3:01 PM  Result Value Ref Range Status   Micro Text Report   Final       SOURCE: IN AND OUT CATH    COMMENT                   NO GROWTH IN 36 HOURS   ANTIBIOTIC                                                        RADIOLOGY:  Dg Chest 2 View  Result Date: 11/14/2016 CLINICAL DATA:  Chest pain. EXAM: CHEST  2 VIEW COMPARISON:  Radiographs of October 11, 2016. FINDINGS: The heart size and mediastinal contours are within normal limits. Both lungs are clear. Atherosclerosis of thoracic aorta is noted. No pneumothorax or pleural effusion is noted. The visualized skeletal structures are  unremarkable. IMPRESSION: No active cardiopulmonary disease. Electronically Signed   By: Marijo Conception, M.D.   On: 11/14/2016 19:58    EKG:   Orders placed or performed during the hospital encounter of 11/14/16  . EKG 12-Lead  . EKG 12-Lead  . ED EKG within 10 minutes  . ED EKG within 10 minutes  . EKG 12-Lead (at 6am)  . EKG 12-Lead (Repeat cardiac markers, recurrent chest pain)  . EKG 12-Lead (at 6am)  . EKG 12-Lead (Repeat cardiac markers, recurrent chest pain)  . EKG 12-Lead  . EKG 12-Lead  . EKG 12-Lead  . EKG 12-Lead      Management plans discussed with the patient, family and they are in agreement.  CODE STATUS:     Code Status Orders        Start     Ordered   11/15/16 0230  Full code  Continuous     11/15/16 0230    Code Status History    Date Active Date Inactive Code Status Order ID Comments User Context   06/18/2016 11:09 AM 06/19/2016  5:15 AM Full Code [id number]  Sabino Dick, MD HOV   09/19/2015  2:01 PM 09/19/2015  7:18 PM Full Code [id number]  Corky Mull, MD Inpatient   07/01/2015  7:54 AM 07/01/2015  3:59 PM Full Code [id number]  Loletha Grayer, MD ED      TOTAL TIME TAKING CARE OF THIS PATIENT: 35 minutes.    Vaughan Basta M.D on 11/16/2016 at 12:46 PM  Between 7am to 6pm - Pager - [phone]  After 6pm go to [email or web address] - password [email or web address] [facility]  Office  [phone]  CC: Primary care physician; Wilhemena Durie, MD   Note: This dictation was prepared with Dragon dictation along with smaller phrase technology. Any transcriptional errors that result  from this process are unintentional.

## 2016-11-16 NOTE — Progress Notes (Signed)
Subjective:  Patient known to our practice from previous admissions. Last seen in 2015 Presents from home via EMS for chest pain and dizziness His diagnoses and unstable angina  He underwent coronary angiography.  Moderate 3+ mitral regurgitation was seen.  60-70% stenosis of ostium of left main was seen. Patent stent proximal LAD.  Dilated cardiomyopathy with EF 31%. Medical management was recommended Chest pain has resolved today.  Patient feels like he is back to his normal  Objective:  Vital signs in last 24 hours:  Temp:  [97.6 F (36.4 C)-98.5 F (36.9 C)] 98 F (36.7 C) (01/06 1153) Pulse Rate:  [52-89] 65 (01/06 1153) Resp:  [11-21] 20 (01/06 1153) BP: (85-134)/(53-85) 106/63 (01/06 1153) SpO2:  [91 %-100 %] 96 % (01/06 1153) Weight:  [82 kg (180 lb 12.4 oz)-83.5 kg (184 lb 1.4 oz)] 82 kg (180 lb 12.4 oz) (01/05 1930)  Weight change: 0 kg (0 lb) Filed Weights   11/15/16 1356 11/15/16 1610 11/15/16 1930  Weight: 83.5 kg (184 lb) 83.5 kg (184 lb 1.4 oz) 82 kg (180 lb 12.4 oz)    Intake/Output:    Intake/Output Summary (Last 24 hours) at 11/16/16 1418 Last data filed at 11/16/16 1227  Gross per 24 hour  Intake              243 ml  Output             1500 ml  Net            -1257 ml     Physical Exam: General: No acute distress, sitting up in bed  HEENT Anicteric, moist mucous membranes, decreased hearing  Neck supple  Pulm/lungs Coarse breath sounds at bases  CVS/Heart Irregular, no rub or gallop  Abdomen:  Soft, nontender  Extremities: Trace edema  Neurologic:  oriented, alert  Skin: No acute rashes  Access: AV fistula       Basic Metabolic Panel:   Recent Labs Lab 11/14/16 1934 11/15/16 0258  NA 139  --   K 4.0  --   CL 94*  --   CO2 32  --   GLUCOSE 224*  --   BUN 62*  --   CREATININE 5.78* 5.66*  CALCIUM 9.6  --      CBC:  Recent Labs Lab 11/14/16 1934 11/15/16 0258  WBC 9.0 9.2  HGB 11.0* 10.1*  HCT 32.6* 29.3*  MCV 100.6*  100.1*  PLT 153 131*      Microbiology:  No results found for this or any previous visit (from the past 720 hour(s)).  Coagulation Studies:  Recent Labs  11/15/16 0748  LABPROT 14.5  INR 1.12    Urinalysis: No results for input(s): COLORURINE, LABSPEC, PHURINE, GLUCOSEU, HGBUR, BILIRUBINUR, KETONESUR, PROTEINUR, UROBILINOGEN, NITRITE, LEUKOCYTESUR in the last 72 hours.  Invalid input(s): APPERANCEUR    Imaging: Dg Chest 2 View  Result Date: 11/14/2016 CLINICAL DATA:  Chest pain. EXAM: CHEST  2 VIEW COMPARISON:  Radiographs of October 11, 2016. FINDINGS: The heart size and mediastinal contours are within normal limits. Both lungs are clear. Atherosclerosis of thoracic aorta is noted. No pneumothorax or pleural effusion is noted. The visualized skeletal structures are unremarkable. IMPRESSION: No active cardiopulmonary disease. Electronically Signed   By: Marijo Conception, M.D.   On: 11/14/2016 19:58     Medications:   . sodium chloride     . aspirin  81 mg Oral Pre-Cath  . aspirin EC  81 mg Oral Daily  .  donepezil  10 mg Oral QHS  . heparin subcutaneous  5,000 Units Subcutaneous Q8H  . insulin aspart  0-15 Units Subcutaneous TID WC  . insulin aspart  0-5 Units Subcutaneous QHS  . metoprolol tartrate  12.5 mg Oral BID  . pantoprazole  40 mg Oral QAC breakfast  . pravastatin  40 mg Oral q1800  . sevelamer carbonate  800 mg Oral TID WC  . sodium chloride flush  3 mL Intravenous Q12H   sodium chloride, acetaminophen, acetaminophen, gi cocktail, morphine injection, nitroGLYCERIN, ondansetron (ZOFRAN) IV, ondansetron (ZOFRAN) IV, sodium chloride flush, zolpidem  Assessment/ Plan:  83 y.o. male with ESRD, multiple myeloma, CAD, CVA, COPD , A fib presents for evaluation of chest pressure and paiin associated with nausea, vomiting and palpitations  [facility]/ [facility]/ MWF/   1. ESRD 2. CAD 3. SHPTH 4. Anemia of CKD 5.  Unstable angina  Plan: He underwent  coronary angiography.  Moderate 3+ mitral regurgitation was seen.  60-70% stenosis of ostium of left main was seen. Patent stent proximal LAD.  Dilated cardiomyopathy with EF 31%. Medical management was recommended Continue outpatient dialysis.  No changes were made in his dialysis regimen.    LOS: 1 Leiah Giannotti 1/6/20182:18 PM

## 2016-11-17 LAB — HEMOGLOBIN A1C
HEMOGLOBIN A1C: 4.8 % (ref 4.8–5.6)
MEAN PLASMA GLUCOSE: 91 mg/dL

## 2016-11-18 ENCOUNTER — Encounter: Payer: Self-pay | Admitting: Cardiology

## 2016-11-18 DIAGNOSIS — I25118 Atherosclerotic heart disease of native coronary artery with other forms of angina pectoris: Secondary | ICD-10-CM | POA: Diagnosis not present

## 2016-11-18 DIAGNOSIS — N186 End stage renal disease: Secondary | ICD-10-CM | POA: Diagnosis not present

## 2016-11-18 DIAGNOSIS — E7800 Pure hypercholesterolemia, unspecified: Secondary | ICD-10-CM | POA: Diagnosis not present

## 2016-11-18 DIAGNOSIS — I481 Persistent atrial fibrillation: Secondary | ICD-10-CM | POA: Diagnosis not present

## 2016-11-18 DIAGNOSIS — I10 Essential (primary) hypertension: Secondary | ICD-10-CM | POA: Diagnosis not present

## 2016-11-18 LAB — GLUCOSE, CAPILLARY: GLUCOSE-CAPILLARY: 80 mg/dL (ref 65–99)

## 2016-11-19 ENCOUNTER — Other Ambulatory Visit: Payer: Self-pay | Admitting: *Deleted

## 2016-11-19 DIAGNOSIS — C9000 Multiple myeloma not having achieved remission: Secondary | ICD-10-CM

## 2016-11-20 ENCOUNTER — Inpatient Hospital Stay: Payer: Medicare Other | Attending: Hematology and Oncology

## 2016-11-20 ENCOUNTER — Inpatient Hospital Stay (HOSPITAL_BASED_OUTPATIENT_CLINIC_OR_DEPARTMENT_OTHER): Payer: Medicare Other | Admitting: Hematology and Oncology

## 2016-11-20 VITALS — BP 114/68 | HR 89 | Temp 97.2°F | Wt 197.1 lb

## 2016-11-20 DIAGNOSIS — D472 Monoclonal gammopathy: Secondary | ICD-10-CM | POA: Diagnosis not present

## 2016-11-20 DIAGNOSIS — Z808 Family history of malignant neoplasm of other organs or systems: Secondary | ICD-10-CM

## 2016-11-20 DIAGNOSIS — Z7982 Long term (current) use of aspirin: Secondary | ICD-10-CM | POA: Diagnosis not present

## 2016-11-20 DIAGNOSIS — Z79899 Other long term (current) drug therapy: Secondary | ICD-10-CM | POA: Diagnosis not present

## 2016-11-20 DIAGNOSIS — D631 Anemia in chronic kidney disease: Secondary | ICD-10-CM

## 2016-11-20 DIAGNOSIS — R079 Chest pain, unspecified: Secondary | ICD-10-CM | POA: Insufficient documentation

## 2016-11-20 DIAGNOSIS — M1990 Unspecified osteoarthritis, unspecified site: Secondary | ICD-10-CM

## 2016-11-20 DIAGNOSIS — E538 Deficiency of other specified B group vitamins: Secondary | ICD-10-CM | POA: Insufficient documentation

## 2016-11-20 DIAGNOSIS — N2581 Secondary hyperparathyroidism of renal origin: Secondary | ICD-10-CM | POA: Diagnosis not present

## 2016-11-20 DIAGNOSIS — F17210 Nicotine dependence, cigarettes, uncomplicated: Secondary | ICD-10-CM | POA: Insufficient documentation

## 2016-11-20 DIAGNOSIS — I252 Old myocardial infarction: Secondary | ICD-10-CM | POA: Diagnosis not present

## 2016-11-20 DIAGNOSIS — D689 Coagulation defect, unspecified: Secondary | ICD-10-CM | POA: Diagnosis not present

## 2016-11-20 DIAGNOSIS — K219 Gastro-esophageal reflux disease without esophagitis: Secondary | ICD-10-CM

## 2016-11-20 DIAGNOSIS — I2510 Atherosclerotic heart disease of native coronary artery without angina pectoris: Secondary | ICD-10-CM

## 2016-11-20 DIAGNOSIS — J449 Chronic obstructive pulmonary disease, unspecified: Secondary | ICD-10-CM | POA: Diagnosis not present

## 2016-11-20 DIAGNOSIS — Z7952 Long term (current) use of systemic steroids: Secondary | ICD-10-CM

## 2016-11-20 DIAGNOSIS — Z992 Dependence on renal dialysis: Secondary | ICD-10-CM

## 2016-11-20 DIAGNOSIS — N186 End stage renal disease: Secondary | ICD-10-CM | POA: Insufficient documentation

## 2016-11-20 DIAGNOSIS — Z800 Family history of malignant neoplasm of digestive organs: Secondary | ICD-10-CM | POA: Insufficient documentation

## 2016-11-20 DIAGNOSIS — Z8673 Personal history of transient ischemic attack (TIA), and cerebral infarction without residual deficits: Secondary | ICD-10-CM | POA: Diagnosis not present

## 2016-11-20 DIAGNOSIS — C9000 Multiple myeloma not having achieved remission: Secondary | ICD-10-CM | POA: Diagnosis not present

## 2016-11-20 DIAGNOSIS — K118 Other diseases of salivary glands: Secondary | ICD-10-CM

## 2016-11-20 DIAGNOSIS — D509 Iron deficiency anemia, unspecified: Secondary | ICD-10-CM | POA: Diagnosis not present

## 2016-11-20 LAB — CBC WITH DIFFERENTIAL/PLATELET
Basophils Absolute: 0 10*3/uL (ref 0–0.1)
Basophils Relative: 0 %
Eosinophils Absolute: 0 10*3/uL (ref 0–0.7)
Eosinophils Relative: 0 %
HCT: 31.4 % — ABNORMAL LOW (ref 40.0–52.0)
Hemoglobin: 10.4 g/dL — ABNORMAL LOW (ref 13.0–18.0)
Lymphocytes Relative: 4 %
Lymphs Abs: 0.4 10*3/uL — ABNORMAL LOW (ref 1.0–3.6)
MCH: 33.4 pg (ref 26.0–34.0)
MCHC: 33 g/dL (ref 32.0–36.0)
MCV: 101 fL — ABNORMAL HIGH (ref 80.0–100.0)
Monocytes Absolute: 0.5 10*3/uL (ref 0.2–1.0)
Monocytes Relative: 5 %
Neutro Abs: 8.9 10*3/uL — ABNORMAL HIGH (ref 1.4–6.5)
Neutrophils Relative %: 91 %
Platelets: 145 10*3/uL — ABNORMAL LOW (ref 150–440)
RBC: 3.11 MIL/uL — ABNORMAL LOW (ref 4.40–5.90)
RDW: 15.1 % — ABNORMAL HIGH (ref 11.5–14.5)
WBC: 9.9 10*3/uL (ref 3.8–10.6)

## 2016-11-20 NOTE — Progress Notes (Signed)
Patient reports that he had a heart attack last Thursday 11-14-16 and was discharged home on Saturday 11-16-16

## 2016-11-20 NOTE — Progress Notes (Signed)
[facility] day:  11/20/2016   Chief Complaint: Frank Huang is a 83 y.o. male with smoldering multiple myeloma who is seen for 3 month assessment.  HPI:  The patient was last seen in the medical oncology clinic on 08/21/2016.  At that time, he felt fine. Chest CT revealed interval resolution of bilateral lower lobe nodules.  The patient was admitted with a myocardial infarction from 11/14/2016 - 11/16/2016.  Per patient, he had a "60% blockage".  He was put on metoprolol.  He had some transient chest pain this morning.  Symptomatically, he notes "not much to complain about".  He continues dialysis 3 x/week.  His right knee was bad last week.  X-ray revealed arthritis.  He was put on prednisone (now off).   Past Medical History:  Diagnosis Date  . Cancer ([place])   . Chronic kidney disease   . COPD (chronic obstructive pulmonary disease) ([place])   . Coronary artery disease   . GERD (gastroesophageal reflux disease)   . Multiple myeloma ([place])   . Myocardial infarction 2005  . Neuropathy ([place])   . Shortness of breath dyspnea   . Stroke (cerebrum) ([facility])    weakness Lt hand    Past Surgical History:  Procedure Laterality Date  . APPENDECTOMY    . AV FISTULA PLACEMENT Left   . CARDIAC CATHETERIZATION N/A 11/15/2016   Procedure: Left Heart Cath and Coronary Angiography;  Surgeon: Isaias Cowman, MD;  Location: [facility];  Service: Cardiovascular;  Laterality: N/A;  . CHOLECYSTECTOMY    . SHOULDER ARTHROSCOPY WITH OPEN ROTATOR CUFF REPAIR Left 09/19/2015   Procedure: SHOULDER ARTHROSCOPY , subacromial decompression, debridement;  Surgeon: Corky Mull, MD;  Location: [facility];  Service: Orthopedics;  Laterality: Left;  . TOTAL HIP ARTHROPLASTY Left     Family History  Problem Relation Age of Onset  . Alzheimer's disease Mother   . Kidney failure Father   . Bone cancer Sister   . Stomach cancer Sister     Social History:   reports that he has been smoking Cigarettes.  He has been smoking about 0.50 packs per day. He has never used smokeless tobacco. He reports that he does not drink alcohol or use drugs.  He began smoking at age 54. He smokes 20 packs per month (2 cartons). He has a 30-40 pack year smoking history.  The patient is accompanied by his wife, [name], today.  Allergies: No Known Allergies  Current Medications: Current Outpatient Prescriptions  Medication Sig Dispense Refill  . aspirin EC 81 MG tablet Take 81 mg by mouth daily.    . Cyanocobalamin (VITAMIN B 12 PO) Take by mouth.    . donepezil (ARICEPT) 10 MG tablet Take 1 tablet (10 mg total) by mouth at bedtime. 90 tablet 3  . lidocaine-prilocaine (EMLA) cream     . lovastatin (MEVACOR) 40 MG tablet TAKE 1 TABLET BY MOUTH EVERY DAY FOR HIGH CHOLESTEROL    . metoprolol tartrate (LOPRESSOR) 25 MG tablet Take 0.5 tablets (12.5 mg total) by mouth 2 (two) times daily. 60 tablet 0  . Multiple Vitamin (MULTI-VITAMINS) TABS Take by mouth. Reported on 04/01/2016    . omeprazole (PRILOSEC) 20 MG capsule TAKE 1 CAPSULE BY MOUTH  DAILY. 90 capsule 3  . predniSONE (STERAPRED UNI-PAK 21 TAB) 10 MG (21) TBPK tablet Take as directed (Patient not taking: Reported on 11/20/2016) 21 tablet 0  . RENVELA 800 MG tablet Take 800  mg by mouth 3 (three) times daily.     No current facility-administered medications for this visit.     Review of Systems:  GENERAL:  Feels "fine".  No fevers or sweats.  Weight up 10 pounds. PERFORMANCE STATUS (ECOG):  1 HEENT:  No visual changes, runny nose, sore throat, mouth sores or tenderness. Lungs: No shortness of breath or cough.  No hemoptysis. Cardiac:  Interval MI, medically managed.  Transient chest pain this morning.  No palpitations, orthopnea, or PND. GI:  No nausea, vomiting, diarrhea, constipation, melena or hematochezia. GU:  Dialysis every MWF.  No urgency, frequency, dysuria, or hematuria. Musculoskeletal:  Left hip  discomfort s/p replacement.  Back pain (chronic).  Right knee issues (see HPI).  No muscle tenderness. Extremities:  No pain or swelling. Skin:  Bruises easily on baby aspirin.  No rashes or skin changes. Neuro:  Left foot numbness. No headache,weakness, balance or coordination issues. Endocrine:  No diabetes, thyroid issues, hot flashes or night sweats. Psych:  No mood changes, depression or anxiety. Pain:  No focal pain. Review of systems:  All other systems reviewed and found to be negative.  Physical Exam: Blood pressure 114/68, pulse 89, temperature 97.2 F (36.2 C), temperature source Tympanic, weight 197 lb 1.5 oz (89.4 kg). GENERAL:  Well developed, well nourished, gentleman sitting comfortably in the exam room in no acute distress. MENTAL STATUS:  Alert and oriented to person, place and time. HEAD:  Thin gray hair.  Male pattern baldness.  Normocephalic, atraumatic, face symmetric, no Cushingoid features. EYES:  Silver rimmed glasses.  Blue eyes.  Pupils equal round and reactive to light and accomodation.  No conjunctivitis or scleral icterus. ENT:  Oropharynx clear without lesion.  Tongue normal.  Dentures.  Mucous membranes moist.  RESPIRATORY:  Clear to auscultation without rales, wheezes or rhonchi. CARDIOVASCULAR:  Regular rate and rhythm without murmur, rub or gallop. ABDOMEN:  Soft, non-tender, with active bowel sounds, and no hepatosplenomegaly.  No masses. SKIN:  Ecchymosis on arms.  Multiple moles.  No rashes, ulcers or lesions. EXTREMITIES: Chronic bilateral lower extremity edema.  No skin discoloration.  No palpable cords. LYMPH NODES:  No palpable cervical, supraclavicular, axillary or inguinal adenopathy  NEUROLOGICAL: Unremarkable. PSYCH:  Appropriate.   Appointment on 11/20/2016  Component Date Value Ref Range Status  . WBC 11/20/2016 9.9  3.8 - 10.6 K/uL Final  . RBC 11/20/2016 3.11* 4.40 - 5.90 MIL/uL Final  . Hemoglobin 11/20/2016 10.4* 13.0 - 18.0 g/dL  Final  . HCT 11/20/2016 31.4* 40.0 - 52.0 % Final  . MCV 11/20/2016 101.0* 80.0 - 100.0 fL Final  . MCH 11/20/2016 33.4  26.0 - 34.0 pg Final  . MCHC 11/20/2016 33.0  32.0 - 36.0 g/dL Final  . RDW 11/20/2016 15.1* 11.5 - 14.5 % Final  . Platelets 11/20/2016 145* 150 - 440 K/uL Final  . Neutrophils Relative % 11/20/2016 91  % Final  . Neutro Abs 11/20/2016 8.9* 1.4 - 6.5 K/uL Final  . Lymphocytes Relative 11/20/2016 4  % Final  . Lymphs Abs 11/20/2016 0.4* 1.0 - 3.6 K/uL Final  . Monocytes Relative 11/20/2016 5  % Final  . Monocytes Absolute 11/20/2016 0.5  0.2 - 1.0 K/uL Final  . Eosinophils Relative 11/20/2016 0  % Final  . Eosinophils Absolute 11/20/2016 0.0  0 - 0.7 K/uL Final  . Basophils Relative 11/20/2016 0  % Final  . Basophils Absolute 11/20/2016 0.0  0 - 0.1 K/uL Final    Assessment:  Frank Huang is a 83 y.o. 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  Bone marrow revealed 20% plasma cells. He was treated with thalidomide for 7 months then discontinued in 06/2005 secondary to rash and depression. He was treated briefly with Revlimid in 2013 which was also discontinued secondary to rash.    M spike has been followed: 0.5 gm/dL on 06/15/2013, 0.8 gm/dL on 01/19/2014, 0.5 gm/dL on 11/22/2014, 0.9 on 06/28/2015, 0.7 on 09/20/2015, 0.6 on 01/02/2016, 0.8 on 04/16/2016, 0.6 on 08/21/2016, and 0.6 on 11/20/2016.  Kappa free light chains were 512 in 06/15/2013, 686 11/02/2013, 929 on 01/19/2014, 1212 in 05/10/2014, 1409 on 11/22/2014, 1119 on 02/28/2015, 851 (ratio 63.84) on 06/28/2015, 1006 (ratio 74.50) on 09/20/2015, 1133 (ratio 59.94) on 01/02/2016, 785.6 (ratio 24.32) on 04/16/2016, 834.9 (ratio 19.19) on 08/21/2016.  He has subsequently been followed off therapy.  He had a hip fracture in in 12/2013.  Per notes, pathology revealed no myeloma.  Bone survey on 01/17/2015 revealed a 9 mm lytic lesion in the frontoparietal region  (previously 7 mm).   He has end-stage renal disease unrelated to his myeloma.  He has been on dialysis since 11/2013 (MWF).   He has anemia due to chronic renal insufficiency.  He has a history of iron deficiency. He had a negative colonoscopy and upper endoscopy in 08/2004. Notes indicate he also had a small bowel capsule study. EGD and colonoscopy in 2014 revealed gastric lesions which were cauterized. He receives IV iron and Procrit with dialysis.  Ferritin is elevated (? acute phase reactant as ESR elevated).  Ferritin was 1121 on 07/05/2015 and 743 on 01/02/2016.  Iron saturation was 30% on 07/05/2015.  He has B12 deficiency.  B12 was 120 on 07/05/2015.  Folate was 10.8.  He is on B12 IM monthy (last on 08/02/2016 with primary care).   He has a history of left shoulder discomfort.  MRI on 06/14/2015 revealed a tendinopathy, osteoarthritis, and capsulitis.   Abdomen and pelvic CT scan on 04/30/2016 revealed a 1.3 cm irregularly marginated nodular lesion in the anterior left lower lobe. There was a subtle area of slightly diminished attenuation near the tail of the pancreas of uncertain significance. There were multiple renal lesions (some did not represent simple cysts).   Chest CT on 05/03/2016 revealed a 1.05 cm ill-defined slightly spiculated irregular nodule in the left lower lobe as well as a 0.8 cm nodule in the right lower lobe.  PET scan on 05/16/2016 revealed no evidence of active skeletal multiple myeloma or plasmacytoma.  The LEFT lower lobe pulmonary nodule was 9 mm and had mild metabolic activity (SUV 1.5).  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  Chest CT on 08/20/2016 revealed interval resolution of bilateral lower lobe nodules c/w inflammatory or infectious process.  Ultrasound-guided biopsy of the right parotid nodule on 06/18/2016 was unsuccessful. He was  seen by Dr. Carloyn Manner on 06/24/2016.  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  Plan was for repeat ultrasound or MRI in 6 months.  He had a myocardial infarction on 11/14/2016.  He is being medically managed.  Symptomatically, he denies any complaints.  He had transient chest pain this morning.  Exam is stable.  Plan: 1.  Labs today:  CBC with diff, CMP, SPEP, free light chains. 2.  Discuss immediate assessment  if chest pain is not transient. 3.  Discuss follow-up ultrasound of right parotid (12/31/2016) - planned. 4.  RTC in 3 months for MD assessment and labs (CBC with diff, CMP, SPEP, free light chains).   Lequita Asal, MD  11/20/2016, 3:20 PM

## 2016-11-21 LAB — PROTEIN ELECTROPHORESIS, SERUM
A/G Ratio: 1.2 (ref 0.7–1.7)
Albumin ELP: 3.5 g/dL (ref 2.9–4.4)
Alpha-1-Globulin: 0.3 g/dL (ref 0.0–0.4)
Alpha-2-Globulin: 0.7 g/dL (ref 0.4–1.0)
Beta Globulin: 1.7 g/dL — ABNORMAL HIGH (ref 0.7–1.3)
Gamma Globulin: 0.3 g/dL — ABNORMAL LOW (ref 0.4–1.8)
Globulin, Total: 3 g/dL (ref 2.2–3.9)
M-Spike, %: 0.6 g/dL — ABNORMAL HIGH
PDF: 0
Total Protein ELP: 6.5 g/dL (ref 6.0–8.5)

## 2016-11-25 DIAGNOSIS — N2581 Secondary hyperparathyroidism of renal origin: Secondary | ICD-10-CM | POA: Diagnosis not present

## 2016-11-25 DIAGNOSIS — D631 Anemia in chronic kidney disease: Secondary | ICD-10-CM | POA: Diagnosis not present

## 2016-11-25 DIAGNOSIS — D509 Iron deficiency anemia, unspecified: Secondary | ICD-10-CM | POA: Diagnosis not present

## 2016-11-25 DIAGNOSIS — D689 Coagulation defect, unspecified: Secondary | ICD-10-CM | POA: Diagnosis not present

## 2016-11-25 DIAGNOSIS — N186 End stage renal disease: Secondary | ICD-10-CM | POA: Diagnosis not present

## 2016-11-29 DIAGNOSIS — N186 End stage renal disease: Secondary | ICD-10-CM | POA: Diagnosis not present

## 2016-11-29 DIAGNOSIS — D631 Anemia in chronic kidney disease: Secondary | ICD-10-CM | POA: Diagnosis not present

## 2016-11-29 DIAGNOSIS — N2581 Secondary hyperparathyroidism of renal origin: Secondary | ICD-10-CM | POA: Diagnosis not present

## 2016-11-29 DIAGNOSIS — D509 Iron deficiency anemia, unspecified: Secondary | ICD-10-CM | POA: Diagnosis not present

## 2016-11-29 DIAGNOSIS — D689 Coagulation defect, unspecified: Secondary | ICD-10-CM | POA: Diagnosis not present

## 2016-12-01 ENCOUNTER — Encounter: Payer: Self-pay | Admitting: Emergency Medicine

## 2016-12-01 ENCOUNTER — Emergency Department
Admission: EM | Admit: 2016-12-01 | Discharge: 2016-12-01 | Disposition: A | Discharge status: Home / Self Care | Payer: Medicare Other | Attending: Emergency Medicine | Admitting: Emergency Medicine

## 2016-12-01 ENCOUNTER — Emergency Department: Payer: Medicare Other

## 2016-12-01 DIAGNOSIS — I2510 Atherosclerotic heart disease of native coronary artery without angina pectoris: Secondary | ICD-10-CM | POA: Insufficient documentation

## 2016-12-01 DIAGNOSIS — R079 Chest pain, unspecified: Secondary | ICD-10-CM

## 2016-12-01 DIAGNOSIS — Z79899 Other long term (current) drug therapy: Secondary | ICD-10-CM | POA: Insufficient documentation

## 2016-12-01 DIAGNOSIS — R0789 Other chest pain: Secondary | ICD-10-CM | POA: Diagnosis not present

## 2016-12-01 DIAGNOSIS — N186 End stage renal disease: Secondary | ICD-10-CM | POA: Diagnosis not present

## 2016-12-01 DIAGNOSIS — F17210 Nicotine dependence, cigarettes, uncomplicated: Secondary | ICD-10-CM | POA: Diagnosis not present

## 2016-12-01 DIAGNOSIS — I252 Old myocardial infarction: Secondary | ICD-10-CM | POA: Insufficient documentation

## 2016-12-01 DIAGNOSIS — Z7982 Long term (current) use of aspirin: Secondary | ICD-10-CM | POA: Insufficient documentation

## 2016-12-01 DIAGNOSIS — J449 Chronic obstructive pulmonary disease, unspecified: Secondary | ICD-10-CM | POA: Diagnosis not present

## 2016-12-01 LAB — CBC
HCT: 29.7 % — ABNORMAL LOW (ref 40.0–52.0)
HEMOGLOBIN: 10.1 g/dL — AB (ref 13.0–18.0)
MCH: 33.8 pg (ref 26.0–34.0)
MCHC: 34 g/dL (ref 32.0–36.0)
MCV: 99.3 fL (ref 80.0–100.0)
Platelets: 107 10*3/uL — ABNORMAL LOW (ref 150–440)
RBC: 2.99 MIL/uL — ABNORMAL LOW (ref 4.40–5.90)
RDW: 14.8 % — ABNORMAL HIGH (ref 11.5–14.5)
WBC: 4.6 10*3/uL (ref 3.8–10.6)

## 2016-12-01 LAB — BASIC METABOLIC PANEL
ANION GAP: 11 (ref 5–15)
BUN: 52 mg/dL — AB (ref 6–20)
CO2: 30 mmol/L (ref 22–32)
CREATININE: 5.52 mg/dL — AB (ref 0.61–1.24)
Calcium: 9.3 mg/dL (ref 8.9–10.3)
Chloride: 100 mmol/L — ABNORMAL LOW (ref 101–111)
GFR calc Af Amer: 10 mL/min — ABNORMAL LOW (ref >60)
GFR, EST NON AFRICAN AMERICAN: 9 mL/min — AB (ref >60)
GLUCOSE: 92 mg/dL (ref 65–99)
Potassium: 4.3 mmol/L (ref 3.5–5.1)
Sodium: 141 mmol/L (ref 135–145)

## 2016-12-01 LAB — TROPONIN I
TROPONIN I: 0.03 ng/mL — AB (ref <0.03)
Troponin I: 0.03 ng/mL (ref <0.03)

## 2016-12-01 MED ORDER — ACETAMINOPHEN 325 MG PO TABS
650.0000 mg | ORAL_TABLET | Freq: Once | ORAL | Status: AC
  Administered 2016-12-01: 650 mg via ORAL
  Filled 2016-12-01: qty 2

## 2016-12-01 NOTE — ED Provider Notes (Signed)
[facility] Emergency Department Provider Note ____________________________________________   I have reviewed the triage vital signs and the triage nursing note.  HISTORY  Chief Complaint Chest Pain   Historian Patient with his wife at the bedside  HPI Frank Huang is a 83 y.o. male with a history of multiple myeloma, prior MI with stent placed in 2005, and recent N STEMI in January followed by catheterization showing 60% lesion per the patient, followed by medical management, presents today for chest pain mild and central without associated symptoms on and off all night long, and slightly worse this morning. He states that presently it still fairly mild and he doesn't think it started from going to sleep. He states that because he's had a heart attack in the past, he wanted to be on the safe side and get it checked out.  No respiratory symptoms. No cough. No fever. No congestion. No overuse or traumatic injury. No indigestion symptoms.  His cardiologist is Dr. Ubaldo Glassing.  He takes 81 mg aspirin at night.    Past Medical History:  Diagnosis Date  . Cancer ([place])   . Chronic kidney disease   . COPD (chronic obstructive pulmonary disease) ([place])   . Coronary artery disease   . GERD (gastroesophageal reflux disease)   . Multiple myeloma ([place])   . Myocardial infarction 2005  . Neuropathy ([place])   . Shortness of breath dyspnea   . Stroke (cerebrum) ([facility])    weakness Lt hand    Patient Active Problem List   Diagnosis Date Noted  . NSTEMI (non-ST elevated myocardial infarction) ([place]) 11/15/2016  . Chest pain, rule out acute myocardial infarction 11/14/2016  . Parotid mass 05/17/2016  . Nodule of right lung 05/11/2016  . Persistent atrial fibrillation ([place]) 02/05/2016  . B12 deficiency 07/25/2015  . Deficiency of vitamin B 07/25/2015  . Chest pain 07/01/2015  . Multiple myeloma ([place]) 04/12/2015  . Arteriosclerosis of coronary artery 04/09/2015  . CAFL (chronic  airflow limitation) ([place]) 04/09/2015  . Chronic kidney disease requiring chronic dialysis ([place]) 04/09/2015  . Gastro-esophageal reflux disease without esophagitis 04/09/2015  . Gout 04/09/2015  . H/O acute myocardial infarction 04/09/2015  . HLD (hyperlipidemia) 04/09/2015  . BP (high blood pressure) 04/09/2015  . Bad memory 04/09/2015  . 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 Kidney failure 04/09/2015  . End-stage renal disease ([place]) 04/09/2015  . Chronic kidney disease (CKD), stage V ([place]) 07/20/2013  . Chronic kidney disease, stage V ([place]) 07/20/2013  . Absolute anemia 03/31/2013  . Neuropathy ([place]) 03/31/2013    Past Surgical History:  Procedure Laterality Date  . APPENDECTOMY    . AV FISTULA PLACEMENT Left   . CARDIAC CATHETERIZATION N/A 11/15/2016   Procedure: Left Heart Cath and Coronary Angiography;  Surgeon: Isaias Cowman, MD;  Location: [facility];  Service: Cardiovascular;  Laterality: N/A;  . CHOLECYSTECTOMY    . SHOULDER ARTHROSCOPY WITH OPEN ROTATOR CUFF REPAIR Left 09/19/2015   Procedure: SHOULDER ARTHROSCOPY , subacromial decompression, debridement;  Surgeon: Corky Mull, MD;  Location: [facility];  Service: Orthopedics;  Laterality: Left;  . TOTAL HIP ARTHROPLASTY Left     Prior to Admission medications   Medication Sig Start Date End Date Taking? Authorizing Provider  aspirin EC 81 MG tablet Take 81 mg by mouth daily.   Yes Historical Provider, MD  Cyanocobalamin (VITAMIN B 12 PO) Take by mouth.   Yes Historical Provider, MD  donepezil (ARICEPT) 10  MG tablet Take 1 tablet (10 mg total) by mouth at bedtime. 01/02/16  Yes Richard Maceo Pro., MD  lidocaine-prilocaine (EMLA) cream  04/18/16  Yes Historical Provider, MD  lovastatin (MEVACOR) 40 MG tablet TAKE 1 TABLET BY MOUTH EVERY DAY FOR HIGH CHOLESTEROL 08/10/14  Yes Historical Provider, MD  metoprolol tartrate (LOPRESSOR) 25 MG tablet Take 0.5 tablets (12.5 mg  total) by mouth 2 (two) times daily. 11/16/16  Yes Vaughan Basta, MD  Multiple Vitamin (MULTI-VITAMINS) TABS Take by mouth. Reported on 04/01/2016   Yes Historical Provider, MD  omeprazole (PRILOSEC) 20 MG capsule TAKE 1 CAPSULE BY MOUTH  DAILY. 11/05/16  Yes Richard Maceo Pro., MD  RENVELA 800 MG tablet Take 800 mg by mouth 3 (three) times daily. 11/05/16  Yes Historical Provider, MD    No Known Allergies  Family History  Problem Relation Age of Onset  . Alzheimer's disease Mother   . Kidney failure Father   . Bone cancer Sister   . Stomach cancer Sister     Social History Social History  Substance Use Topics  . Smoking status: Current Every Day Smoker    Packs/day: 0.50    Types: Cigarettes  . Smokeless tobacco: Never Used     Comment: smokes about 3 cigarettes per week  . Alcohol use No    Review of Systems  Constitutional: Negative for fever. Eyes: Negative for visual changes. ENT: Negative for sore throat. Cardiovascular: Mild central chest "pain".. Respiratory: Negative for shortness of breath. Gastrointestinal: Negative for abdominal pain, vomiting and diarrhea. Genitourinary: Negative for dysuria. Musculoskeletal: Negative for back pain. Skin: Negative for rash. Neurological: Negative for headache. 10 point Review of Systems otherwise negative ____________________________________________   PHYSICAL EXAM:  VITAL SIGNS: ED Triage Vitals  Enc Vitals Group     BP 12/01/16 1115 122/66     Pulse Rate 12/01/16 1115 73     Resp 12/01/16 1115 20     Temp --      Temp src --      SpO2 12/01/16 1115 99 %     Weight 12/01/16 0941 197 lb (89.4 kg)     Height 12/01/16 0941 6' 3"  (1.905 m)     Head Circumference --      Peak Flow --      Pain Score 12/01/16 0942 5     Pain Loc --      Pain Edu? --      Excl. in [place]? --      Constitutional: Alert and oriented. Well appearing and in no distress. HEENT   Head: Normocephalic and atraumatic.       Eyes: Conjunctivae are normal. PERRL. Normal extraocular movements.      Ears:         Nose: No congestion/rhinnorhea.   Mouth/Throat: Mucous membranes are moist.   Neck: No stridor. Cardiovascular/Chest: Normal rate, regular rhythm.  No murmurs, rubs, or gallops. Respiratory: Normal respiratory effort without tachypnea nor retractions. Breath sounds are clear and equal bilaterally. No wheezes/rales/rhonchi. Gastrointestinal: Soft. No distention, no guarding, no rebound. Nontender.    Genitourinary/rectal:Deferred Musculoskeletal: Nontender with normal range of motion in all extremities. No joint effusions.  No lower extremity tenderness.  1+ lower extremity edema bilaterally. Neurologic:  Normal speech and language. No gross or focal neurologic deficits are appreciated. Skin:  Skin is warm, dry and intact. No rash noted. Psychiatric: Mood and affect are normal. Speech and behavior are normal. Patient exhibits appropriate insight and judgment.  ____________________________________________  LABS (pertinent positives/negatives)  Labs Reviewed  BASIC METABOLIC PANEL - Abnormal; Notable for the following:       Result Value   Chloride 100 (*)    BUN 52 (*)    Creatinine, Ser 5.52 (*)    GFR calc non Af Amer 9 (*)    GFR calc Af Amer 10 (*)    All other components within normal limits  CBC - Abnormal; Notable for the following:    RBC 2.99 (*)    Hemoglobin 10.1 (*)    HCT 29.7 (*)    RDW 14.8 (*)    Platelets 107 (*)    All other components within normal limits  TROPONIN I - Abnormal; Notable for the following:    Troponin I 0.03 (*)    All other components within normal limits  TROPONIN I - Abnormal; Notable for the following:    Troponin I 0.03 (*)    All other components within normal limits    ____________________________________________    EKG I, Lisa Roca, MD, the attending physician have personally viewed and interpreted all ECGs.  85 bpm. Atrial  fibrillation with occasional PVCs. Narrow QRS. Left axis deviation. Nonspecific ST and T-wave ____________________________________________  RADIOLOGY All Xrays were viewed by me. Imaging interpreted by Radiologist.  Chest x-ray two-view: No active cardiopulmonary disease. __________________________________________  PROCEDURES  Procedure(s) performed: None  Critical Care performed: None  ____________________________________________   ED COURSE / ASSESSMENT AND PLAN  Pertinent labs & imaging results that were available during my care of the patient were reviewed by me and considered in my medical decision making (see chart for details).   Mr. Ingwersen is a history of coronary disease and recent and STEMI, as well as a stent placed in 2005, presenting with what he considers fairly mild chest discomfort without associated symptoms, but wanted to be checked out given his history of recent and STEMI.  His EKG shows chronic A. fib with occasional PVCs. No certain ischemic findings. He reports his symptoms are overall mild.  He is a dialysis patient. His troponin is 0.03, and given he is a dialysis patient ongoing pain all night, I think this is probably reflective but will recheck a troponin to ensure no elevation, as his recent and STEMI had started at 0.03 and then gone up from there.  No other obvious sources for the mild chest discomfort.  Repeat troponin unchanged, not going up, reassuring. I give the patient does of Tylenol for mild discomfort. He is ready to go home and follow-up with his cardiologist.    CONSULTATIONS:   None  Patient / Family / Caregiver informed of clinical course, medical decision-making process, and agree with plan.   I discussed return precautions, follow-up instructions, and discharge instructions with patient and/or family.   ___________________________________________   FINAL CLINICAL IMPRESSION(S) / ED DIAGNOSES   Final diagnoses:  Nonspecific  chest pain              Note: This dictation was prepared with Dragon dictation. Any transcriptional errors that result from this process are unintentional    Lisa Roca, MD 12/01/16 1428

## 2016-12-01 NOTE — ED Notes (Signed)
Troponin reported to Eual Fines, MD

## 2016-12-01 NOTE — ED Triage Notes (Addendum)
Pt c/o left sided chest pain intermittent last night and constant today. Denies SHOB, nausea, vomiting, or sweating. No distress currently. Skin pink and dry. Recently here with elevated troponin

## 2016-12-01 NOTE — Discharge Instructions (Signed)
You were evaluated for chest discomfort, and although no certain cause was found, and your exam and evaluation are reassuring in the emergency department today.  Return to the emergency department for any worsening chest pain, sweats, nausea, dizziness or passing out, trouble breathing, or any other symptoms concerning to you.

## 2016-12-02 DIAGNOSIS — M7121 Synovial cyst of popliteal space [Baker], right knee: Secondary | ICD-10-CM | POA: Diagnosis not present

## 2016-12-02 DIAGNOSIS — M1711 Unilateral primary osteoarthritis, right knee: Secondary | ICD-10-CM | POA: Diagnosis not present

## 2016-12-04 DIAGNOSIS — D631 Anemia in chronic kidney disease: Secondary | ICD-10-CM | POA: Diagnosis not present

## 2016-12-04 DIAGNOSIS — D509 Iron deficiency anemia, unspecified: Secondary | ICD-10-CM | POA: Diagnosis not present

## 2016-12-04 DIAGNOSIS — N186 End stage renal disease: Secondary | ICD-10-CM | POA: Diagnosis not present

## 2016-12-04 DIAGNOSIS — N2581 Secondary hyperparathyroidism of renal origin: Secondary | ICD-10-CM | POA: Diagnosis not present

## 2016-12-04 DIAGNOSIS — D689 Coagulation defect, unspecified: Secondary | ICD-10-CM | POA: Diagnosis not present

## 2016-12-06 ENCOUNTER — Ambulatory Visit (INDEPENDENT_AMBULATORY_CARE_PROVIDER_SITE_OTHER): Payer: Medicare Other | Admitting: Family Medicine

## 2016-12-06 ENCOUNTER — Encounter: Payer: Self-pay | Admitting: Family Medicine

## 2016-12-06 VITALS — BP 100/48 | HR 93 | Temp 97.6°F | Resp 16 | Wt 191.6 lb

## 2016-12-06 DIAGNOSIS — K219 Gastro-esophageal reflux disease without esophagitis: Secondary | ICD-10-CM | POA: Diagnosis not present

## 2016-12-06 DIAGNOSIS — N186 End stage renal disease: Secondary | ICD-10-CM | POA: Diagnosis not present

## 2016-12-06 DIAGNOSIS — D509 Iron deficiency anemia, unspecified: Secondary | ICD-10-CM | POA: Diagnosis not present

## 2016-12-06 DIAGNOSIS — N2581 Secondary hyperparathyroidism of renal origin: Secondary | ICD-10-CM | POA: Diagnosis not present

## 2016-12-06 DIAGNOSIS — D631 Anemia in chronic kidney disease: Secondary | ICD-10-CM | POA: Diagnosis not present

## 2016-12-06 DIAGNOSIS — D689 Coagulation defect, unspecified: Secondary | ICD-10-CM | POA: Diagnosis not present

## 2016-12-06 MED ORDER — SUCRALFATE 1 G PO TABS: 1.0000 g | ORAL_TABLET | Freq: Three times a day (TID) | ORAL | 11 refills | Status: DC

## 2016-12-06 NOTE — Progress Notes (Signed)
Patient: Frank Huang Male    DOB: [birth year]   83 y.o.   MRN: [id number] Visit Date: 12/06/2016  Today's Provider: Wilhemena Durie, MD   Chief Complaint  Patient presents with  . Chest Pain   Subjective:    HPI    Patient comes in office today accompanied with his wife with concerns of intermittent chest pain since 11/30/16. Patient was seen at [facility] ER on 12/01/16 and was advised to follow up with cardiologist. Patient reports for the past 2 days pain has been intermittent described as a dull ache, patient states discomfort is on left side of chest. Denies shortness of breath or numbness/tingling.  On the patient by the chest pain is very inconsistent. No Known Allergies   Current Outpatient Prescriptions:  .  aspirin EC 81 MG tablet, Take 81 mg by mouth daily., Disp: , Rfl:  .  Cyanocobalamin (VITAMIN B 12 PO), Take by mouth., Disp: , Rfl:  .  donepezil (ARICEPT) 10 MG tablet, Take 1 tablet (10 mg total) by mouth at bedtime., Disp: 90 tablet, Rfl: 3 .  lidocaine-prilocaine (EMLA) cream, , Disp: , Rfl:  .  lovastatin (MEVACOR) 40 MG tablet, TAKE 1 TABLET BY MOUTH EVERY DAY FOR HIGH CHOLESTEROL, Disp: , Rfl:  .  metoprolol tartrate (LOPRESSOR) 25 MG tablet, Take 0.5 tablets (12.5 mg total) by mouth 2 (two) times daily., Disp: 60 tablet, Rfl: 0 .  Multiple Vitamin (MULTI-VITAMINS) TABS, Take by mouth. Reported on 04/01/2016, Disp: , Rfl:  .  omeprazole (PRILOSEC) 20 MG capsule, TAKE 1 CAPSULE BY MOUTH  DAILY., Disp: 90 capsule, Rfl: 3 .  RENVELA 800 MG tablet, Take 800 mg by mouth 3 (three) times daily., Disp: , Rfl:   Review of Systems  Constitutional: Negative.   HENT: Negative.   Eyes: Negative.   Respiratory: Negative for apnea, cough, choking, chest tightness, shortness of breath, wheezing and stridor.   Cardiovascular: Positive for chest pain. Negative for palpitations and leg swelling.  Gastrointestinal: Negative.   Endocrine: Negative.   Genitourinary: Negative.     Musculoskeletal: Negative.   Skin: Negative.   Allergic/Immunologic: Negative.   Neurological: Negative.   Psychiatric/Behavioral: Negative.     Social History  Substance Use Topics  . Smoking status: Current Every Day Smoker    Packs/day: 0.50    Types: Cigarettes  . Smokeless tobacco: Never Used     Comment: smokes about 3 cigarettes per week  . Alcohol use No   Objective:   BP (!) 100/48   Pulse 93   Temp 97.6 F (36.4 C) (Oral)   Resp 16   Wt 191 lb 9.6 oz (86.9 kg)   SpO2 99%   BMI 23.95 kg/m   Physical Exam  Constitutional: He is oriented to person, place, and time. He appears well-developed and well-nourished.  HENT:  Head: Normocephalic and atraumatic.  Right Ear: External ear normal.  Left Ear: External ear normal.  Nose: Nose normal.  Eyes: Conjunctivae are normal. Left eye exhibits no discharge.  Cardiovascular: Normal rate, regular rhythm and normal heart sounds.   Pulmonary/Chest: Effort normal.  Abdominal: Soft.  Neurological: He is alert and oriented to person, place, and time.  Skin: Skin is warm and dry.  Psychiatric: He has a normal mood and affect. His behavior is normal. Judgment and thought content normal.        Assessment & Plan:     1. Gastroesophageal reflux disease, esophagitis presence not specified  It is possible this is cardiac but I do not think so. The wife feels the same way.Story very vague. Dementia is an issue. - sucralfate (CARAFATE) 1 g tablet; Take 1 tablet (1 g total) by mouth 4 (four) times daily -  before meals and at bedtime.  Dispense: 120 tablet; Refill: 11 2.ESRD Patient encouraged to go to dialysis which he tries to avoid.     I have done the exam and reviewed the above chart and it is accurate to the best of my knowledge. Development worker, community has been used in this note in any air is in the dictation or transcription are unintentional.  Wilhemena Durie, MD  [place]

## 2016-12-09 DIAGNOSIS — D509 Iron deficiency anemia, unspecified: Secondary | ICD-10-CM | POA: Diagnosis not present

## 2016-12-09 DIAGNOSIS — D689 Coagulation defect, unspecified: Secondary | ICD-10-CM | POA: Diagnosis not present

## 2016-12-09 DIAGNOSIS — N186 End stage renal disease: Secondary | ICD-10-CM | POA: Diagnosis not present

## 2016-12-09 DIAGNOSIS — N2581 Secondary hyperparathyroidism of renal origin: Secondary | ICD-10-CM | POA: Diagnosis not present

## 2016-12-09 DIAGNOSIS — D631 Anemia in chronic kidney disease: Secondary | ICD-10-CM | POA: Diagnosis not present

## 2016-12-11 DIAGNOSIS — Z992 Dependence on renal dialysis: Secondary | ICD-10-CM | POA: Diagnosis not present

## 2016-12-11 DIAGNOSIS — N186 End stage renal disease: Secondary | ICD-10-CM | POA: Diagnosis not present

## 2016-12-11 DIAGNOSIS — D689 Coagulation defect, unspecified: Secondary | ICD-10-CM | POA: Diagnosis not present

## 2016-12-11 DIAGNOSIS — N2581 Secondary hyperparathyroidism of renal origin: Secondary | ICD-10-CM | POA: Diagnosis not present

## 2016-12-11 DIAGNOSIS — I120 Hypertensive chronic kidney disease with stage 5 chronic kidney disease or end stage renal disease: Secondary | ICD-10-CM | POA: Diagnosis not present

## 2016-12-11 DIAGNOSIS — D509 Iron deficiency anemia, unspecified: Secondary | ICD-10-CM | POA: Diagnosis not present

## 2016-12-11 DIAGNOSIS — D631 Anemia in chronic kidney disease: Secondary | ICD-10-CM | POA: Diagnosis not present

## 2016-12-16 DIAGNOSIS — D631 Anemia in chronic kidney disease: Secondary | ICD-10-CM | POA: Diagnosis not present

## 2016-12-16 DIAGNOSIS — D689 Coagulation defect, unspecified: Secondary | ICD-10-CM | POA: Diagnosis not present

## 2016-12-16 DIAGNOSIS — N2581 Secondary hyperparathyroidism of renal origin: Secondary | ICD-10-CM | POA: Diagnosis not present

## 2016-12-16 DIAGNOSIS — N186 End stage renal disease: Secondary | ICD-10-CM | POA: Diagnosis not present

## 2016-12-18 DIAGNOSIS — N2581 Secondary hyperparathyroidism of renal origin: Secondary | ICD-10-CM | POA: Diagnosis not present

## 2016-12-18 DIAGNOSIS — N186 End stage renal disease: Secondary | ICD-10-CM | POA: Diagnosis not present

## 2016-12-18 DIAGNOSIS — D631 Anemia in chronic kidney disease: Secondary | ICD-10-CM | POA: Diagnosis not present

## 2016-12-18 DIAGNOSIS — D689 Coagulation defect, unspecified: Secondary | ICD-10-CM | POA: Diagnosis not present

## 2016-12-20 DIAGNOSIS — N2581 Secondary hyperparathyroidism of renal origin: Secondary | ICD-10-CM | POA: Diagnosis not present

## 2016-12-20 DIAGNOSIS — D689 Coagulation defect, unspecified: Secondary | ICD-10-CM | POA: Diagnosis not present

## 2016-12-20 DIAGNOSIS — N186 End stage renal disease: Secondary | ICD-10-CM | POA: Diagnosis not present

## 2016-12-20 DIAGNOSIS — D631 Anemia in chronic kidney disease: Secondary | ICD-10-CM | POA: Diagnosis not present

## 2016-12-23 DIAGNOSIS — N186 End stage renal disease: Secondary | ICD-10-CM | POA: Diagnosis not present

## 2016-12-23 DIAGNOSIS — N2581 Secondary hyperparathyroidism of renal origin: Secondary | ICD-10-CM | POA: Diagnosis not present

## 2016-12-23 DIAGNOSIS — D689 Coagulation defect, unspecified: Secondary | ICD-10-CM | POA: Diagnosis not present

## 2016-12-23 DIAGNOSIS — D631 Anemia in chronic kidney disease: Secondary | ICD-10-CM | POA: Diagnosis not present

## 2016-12-24 ENCOUNTER — Other Ambulatory Visit: Payer: Self-pay | Admitting: Orthopedic Surgery

## 2016-12-24 ENCOUNTER — Ambulatory Visit
Admission: RE | Admit: 2016-12-24 | Discharge: 2016-12-24 | Disposition: A | Discharge status: Home / Self Care | Payer: Medicare Other | Source: Ambulatory Visit | Attending: Otolaryngology | Admitting: Otolaryngology

## 2016-12-24 ENCOUNTER — Ambulatory Visit
Admission: RE | Admit: 2016-12-24 | Discharge: 2016-12-24 | Disposition: A | Discharge status: Home / Self Care | Payer: Medicare Other | Source: Ambulatory Visit | Attending: Orthopedic Surgery | Admitting: Orthopedic Surgery

## 2016-12-24 ENCOUNTER — Other Ambulatory Visit
Admission: RE | Admit: 2016-12-24 | Discharge: 2016-12-24 | Disposition: A | Discharge status: Home / Self Care | Payer: Medicare Other | Source: Ambulatory Visit | Attending: Orthopedic Surgery | Admitting: Orthopedic Surgery

## 2016-12-24 DIAGNOSIS — M79661 Pain in right lower leg: Secondary | ICD-10-CM

## 2016-12-24 DIAGNOSIS — M7989 Other specified soft tissue disorders: Secondary | ICD-10-CM | POA: Insufficient documentation

## 2016-12-24 DIAGNOSIS — G8929 Other chronic pain: Secondary | ICD-10-CM | POA: Diagnosis not present

## 2016-12-24 DIAGNOSIS — K118 Other diseases of salivary glands: Secondary | ICD-10-CM

## 2016-12-24 DIAGNOSIS — R937 Abnormal findings on diagnostic imaging of other parts of musculoskeletal system: Secondary | ICD-10-CM | POA: Diagnosis not present

## 2016-12-24 DIAGNOSIS — M25561 Pain in right knee: Secondary | ICD-10-CM | POA: Diagnosis not present

## 2016-12-24 DIAGNOSIS — M1711 Unilateral primary osteoarthritis, right knee: Secondary | ICD-10-CM | POA: Diagnosis not present

## 2016-12-24 DIAGNOSIS — M79604 Pain in right leg: Secondary | ICD-10-CM | POA: Diagnosis not present

## 2016-12-25 DIAGNOSIS — D631 Anemia in chronic kidney disease: Secondary | ICD-10-CM | POA: Diagnosis not present

## 2016-12-25 DIAGNOSIS — N2581 Secondary hyperparathyroidism of renal origin: Secondary | ICD-10-CM | POA: Diagnosis not present

## 2016-12-25 DIAGNOSIS — D689 Coagulation defect, unspecified: Secondary | ICD-10-CM | POA: Diagnosis not present

## 2016-12-25 DIAGNOSIS — N186 End stage renal disease: Secondary | ICD-10-CM | POA: Diagnosis not present

## 2016-12-26 ENCOUNTER — Encounter: Payer: Self-pay | Admitting: Family Medicine

## 2016-12-27 DIAGNOSIS — N2581 Secondary hyperparathyroidism of renal origin: Secondary | ICD-10-CM | POA: Diagnosis not present

## 2016-12-27 DIAGNOSIS — N186 End stage renal disease: Secondary | ICD-10-CM | POA: Diagnosis not present

## 2016-12-27 DIAGNOSIS — D631 Anemia in chronic kidney disease: Secondary | ICD-10-CM | POA: Diagnosis not present

## 2016-12-27 DIAGNOSIS — D689 Coagulation defect, unspecified: Secondary | ICD-10-CM | POA: Diagnosis not present

## 2016-12-30 ENCOUNTER — Ambulatory Visit
Admission: RE | Admit: 2016-12-30 | Discharge: 2016-12-30 | Disposition: A | Discharge status: Home / Self Care | Payer: Medicare Other | Source: Ambulatory Visit | Attending: Otolaryngology | Admitting: Otolaryngology

## 2016-12-30 ENCOUNTER — Other Ambulatory Visit: Payer: Self-pay | Admitting: Otolaryngology

## 2016-12-30 ENCOUNTER — Ambulatory Visit: Payer: Medicare Other

## 2016-12-30 DIAGNOSIS — N186 End stage renal disease: Secondary | ICD-10-CM | POA: Diagnosis not present

## 2016-12-30 DIAGNOSIS — R220 Localized swelling, mass and lump, head: Secondary | ICD-10-CM

## 2016-12-30 DIAGNOSIS — K118 Other diseases of salivary glands: Secondary | ICD-10-CM | POA: Insufficient documentation

## 2016-12-30 DIAGNOSIS — D631 Anemia in chronic kidney disease: Secondary | ICD-10-CM | POA: Diagnosis not present

## 2016-12-30 DIAGNOSIS — I739 Peripheral vascular disease, unspecified: Secondary | ICD-10-CM | POA: Diagnosis not present

## 2016-12-30 DIAGNOSIS — N2581 Secondary hyperparathyroidism of renal origin: Secondary | ICD-10-CM | POA: Diagnosis not present

## 2016-12-30 DIAGNOSIS — L851 Acquired keratosis [keratoderma] palmaris et plantaris: Secondary | ICD-10-CM | POA: Diagnosis not present

## 2016-12-30 DIAGNOSIS — D110 Benign neoplasm of parotid gland: Secondary | ICD-10-CM | POA: Diagnosis not present

## 2016-12-30 DIAGNOSIS — B351 Tinea unguium: Secondary | ICD-10-CM | POA: Diagnosis not present

## 2016-12-30 DIAGNOSIS — D689 Coagulation defect, unspecified: Secondary | ICD-10-CM | POA: Diagnosis not present

## 2016-12-31 ENCOUNTER — Other Ambulatory Visit: Payer: Self-pay | Admitting: Otolaryngology

## 2016-12-31 DIAGNOSIS — H6123 Impacted cerumen, bilateral: Secondary | ICD-10-CM | POA: Diagnosis not present

## 2016-12-31 DIAGNOSIS — D37030 Neoplasm of uncertain behavior of the parotid salivary glands: Secondary | ICD-10-CM | POA: Diagnosis not present

## 2016-12-31 DIAGNOSIS — K118 Other diseases of salivary glands: Secondary | ICD-10-CM

## 2017-01-01 DIAGNOSIS — D689 Coagulation defect, unspecified: Secondary | ICD-10-CM | POA: Diagnosis not present

## 2017-01-01 DIAGNOSIS — N2581 Secondary hyperparathyroidism of renal origin: Secondary | ICD-10-CM | POA: Diagnosis not present

## 2017-01-01 DIAGNOSIS — N186 End stage renal disease: Secondary | ICD-10-CM | POA: Diagnosis not present

## 2017-01-01 DIAGNOSIS — D631 Anemia in chronic kidney disease: Secondary | ICD-10-CM | POA: Diagnosis not present

## 2017-01-03 DIAGNOSIS — D689 Coagulation defect, unspecified: Secondary | ICD-10-CM | POA: Diagnosis not present

## 2017-01-03 DIAGNOSIS — N2581 Secondary hyperparathyroidism of renal origin: Secondary | ICD-10-CM | POA: Diagnosis not present

## 2017-01-03 DIAGNOSIS — N186 End stage renal disease: Secondary | ICD-10-CM | POA: Diagnosis not present

## 2017-01-03 DIAGNOSIS — D631 Anemia in chronic kidney disease: Secondary | ICD-10-CM | POA: Diagnosis not present

## 2017-01-06 DIAGNOSIS — D689 Coagulation defect, unspecified: Secondary | ICD-10-CM | POA: Diagnosis not present

## 2017-01-06 DIAGNOSIS — D631 Anemia in chronic kidney disease: Secondary | ICD-10-CM | POA: Diagnosis not present

## 2017-01-06 DIAGNOSIS — N2581 Secondary hyperparathyroidism of renal origin: Secondary | ICD-10-CM | POA: Diagnosis not present

## 2017-01-06 DIAGNOSIS — N186 End stage renal disease: Secondary | ICD-10-CM | POA: Diagnosis not present

## 2017-01-08 DIAGNOSIS — I120 Hypertensive chronic kidney disease with stage 5 chronic kidney disease or end stage renal disease: Secondary | ICD-10-CM | POA: Diagnosis not present

## 2017-01-08 DIAGNOSIS — N186 End stage renal disease: Secondary | ICD-10-CM | POA: Diagnosis not present

## 2017-01-08 DIAGNOSIS — Z992 Dependence on renal dialysis: Secondary | ICD-10-CM | POA: Diagnosis not present

## 2017-01-10 DIAGNOSIS — N2581 Secondary hyperparathyroidism of renal origin: Secondary | ICD-10-CM | POA: Diagnosis not present

## 2017-01-10 DIAGNOSIS — N186 End stage renal disease: Secondary | ICD-10-CM | POA: Diagnosis not present

## 2017-01-10 DIAGNOSIS — D631 Anemia in chronic kidney disease: Secondary | ICD-10-CM | POA: Diagnosis not present

## 2017-01-10 DIAGNOSIS — D689 Coagulation defect, unspecified: Secondary | ICD-10-CM | POA: Diagnosis not present

## 2017-01-12 ENCOUNTER — Encounter: Payer: Self-pay | Admitting: Hematology and Oncology

## 2017-01-13 DIAGNOSIS — D689 Coagulation defect, unspecified: Secondary | ICD-10-CM | POA: Diagnosis not present

## 2017-01-13 DIAGNOSIS — N186 End stage renal disease: Secondary | ICD-10-CM | POA: Diagnosis not present

## 2017-01-13 DIAGNOSIS — N2581 Secondary hyperparathyroidism of renal origin: Secondary | ICD-10-CM | POA: Diagnosis not present

## 2017-01-13 DIAGNOSIS — D631 Anemia in chronic kidney disease: Secondary | ICD-10-CM | POA: Diagnosis not present

## 2017-01-15 DIAGNOSIS — D689 Coagulation defect, unspecified: Secondary | ICD-10-CM | POA: Diagnosis not present

## 2017-01-15 DIAGNOSIS — N2581 Secondary hyperparathyroidism of renal origin: Secondary | ICD-10-CM | POA: Diagnosis not present

## 2017-01-15 DIAGNOSIS — D631 Anemia in chronic kidney disease: Secondary | ICD-10-CM | POA: Diagnosis not present

## 2017-01-15 DIAGNOSIS — N186 End stage renal disease: Secondary | ICD-10-CM | POA: Diagnosis not present

## 2017-01-17 DIAGNOSIS — N186 End stage renal disease: Secondary | ICD-10-CM | POA: Diagnosis not present

## 2017-01-17 DIAGNOSIS — N2581 Secondary hyperparathyroidism of renal origin: Secondary | ICD-10-CM | POA: Diagnosis not present

## 2017-01-17 DIAGNOSIS — D689 Coagulation defect, unspecified: Secondary | ICD-10-CM | POA: Diagnosis not present

## 2017-01-17 DIAGNOSIS — D631 Anemia in chronic kidney disease: Secondary | ICD-10-CM | POA: Diagnosis not present

## 2017-01-20 DIAGNOSIS — D631 Anemia in chronic kidney disease: Secondary | ICD-10-CM | POA: Diagnosis not present

## 2017-01-20 DIAGNOSIS — D689 Coagulation defect, unspecified: Secondary | ICD-10-CM | POA: Diagnosis not present

## 2017-01-20 DIAGNOSIS — N2581 Secondary hyperparathyroidism of renal origin: Secondary | ICD-10-CM | POA: Diagnosis not present

## 2017-01-20 DIAGNOSIS — N186 End stage renal disease: Secondary | ICD-10-CM | POA: Diagnosis not present

## 2017-01-22 DIAGNOSIS — D631 Anemia in chronic kidney disease: Secondary | ICD-10-CM | POA: Diagnosis not present

## 2017-01-22 DIAGNOSIS — N2581 Secondary hyperparathyroidism of renal origin: Secondary | ICD-10-CM | POA: Diagnosis not present

## 2017-01-22 DIAGNOSIS — N186 End stage renal disease: Secondary | ICD-10-CM | POA: Diagnosis not present

## 2017-01-22 DIAGNOSIS — D689 Coagulation defect, unspecified: Secondary | ICD-10-CM | POA: Diagnosis not present

## 2017-01-23 DIAGNOSIS — M1711 Unilateral primary osteoarthritis, right knee: Secondary | ICD-10-CM | POA: Diagnosis not present

## 2017-01-24 DIAGNOSIS — N186 End stage renal disease: Secondary | ICD-10-CM | POA: Diagnosis not present

## 2017-01-24 DIAGNOSIS — D631 Anemia in chronic kidney disease: Secondary | ICD-10-CM | POA: Diagnosis not present

## 2017-01-24 DIAGNOSIS — N2581 Secondary hyperparathyroidism of renal origin: Secondary | ICD-10-CM | POA: Diagnosis not present

## 2017-01-24 DIAGNOSIS — D689 Coagulation defect, unspecified: Secondary | ICD-10-CM | POA: Diagnosis not present

## 2017-01-28 ENCOUNTER — Emergency Department: Payer: Medicare Other

## 2017-01-28 ENCOUNTER — Inpatient Hospital Stay
Admit: 2017-01-28 | Discharge: 2017-01-28 | Disposition: A | Discharge status: Home / Self Care | Payer: Medicare Other | Attending: Internal Medicine | Admitting: Internal Medicine

## 2017-01-28 ENCOUNTER — Inpatient Hospital Stay
Admission: EM | Admit: 2017-01-28 | Discharge: 2017-01-30 | DRG: 193 | Disposition: A | Discharge status: Home / Self Care | Payer: Medicare Other | Attending: Internal Medicine | Admitting: Internal Medicine

## 2017-01-28 DIAGNOSIS — I132 Hypertensive heart and chronic kidney disease with heart failure and with stage 5 chronic kidney disease, or end stage renal disease: Secondary | ICD-10-CM | POA: Diagnosis present

## 2017-01-28 DIAGNOSIS — I5023 Acute on chronic systolic (congestive) heart failure: Secondary | ICD-10-CM | POA: Diagnosis present

## 2017-01-28 DIAGNOSIS — G629 Polyneuropathy, unspecified: Secondary | ICD-10-CM | POA: Diagnosis present

## 2017-01-28 DIAGNOSIS — I120 Hypertensive chronic kidney disease with stage 5 chronic kidney disease or end stage renal disease: Secondary | ICD-10-CM | POA: Diagnosis not present

## 2017-01-28 DIAGNOSIS — J181 Lobar pneumonia, unspecified organism: Secondary | ICD-10-CM

## 2017-01-28 DIAGNOSIS — J189 Pneumonia, unspecified organism: Principal | ICD-10-CM | POA: Diagnosis present

## 2017-01-28 DIAGNOSIS — I5021 Acute systolic (congestive) heart failure: Secondary | ICD-10-CM | POA: Diagnosis not present

## 2017-01-28 DIAGNOSIS — I2510 Atherosclerotic heart disease of native coronary artery without angina pectoris: Secondary | ICD-10-CM | POA: Diagnosis present

## 2017-01-28 DIAGNOSIS — I444 Left anterior fascicular block: Secondary | ICD-10-CM | POA: Diagnosis present

## 2017-01-28 DIAGNOSIS — J441 Chronic obstructive pulmonary disease with (acute) exacerbation: Secondary | ICD-10-CM | POA: Diagnosis not present

## 2017-01-28 DIAGNOSIS — I340 Nonrheumatic mitral (valve) insufficiency: Secondary | ICD-10-CM | POA: Diagnosis present

## 2017-01-28 DIAGNOSIS — Z992 Dependence on renal dialysis: Secondary | ICD-10-CM

## 2017-01-28 DIAGNOSIS — I482 Chronic atrial fibrillation: Secondary | ICD-10-CM | POA: Diagnosis not present

## 2017-01-28 DIAGNOSIS — R0600 Dyspnea, unspecified: Secondary | ICD-10-CM | POA: Diagnosis not present

## 2017-01-28 DIAGNOSIS — N186 End stage renal disease: Secondary | ICD-10-CM | POA: Diagnosis not present

## 2017-01-28 DIAGNOSIS — Z9115 Patient's noncompliance with renal dialysis: Secondary | ICD-10-CM | POA: Diagnosis not present

## 2017-01-28 DIAGNOSIS — I5022 Chronic systolic (congestive) heart failure: Secondary | ICD-10-CM | POA: Diagnosis not present

## 2017-01-28 DIAGNOSIS — F17210 Nicotine dependence, cigarettes, uncomplicated: Secondary | ICD-10-CM | POA: Diagnosis present

## 2017-01-28 DIAGNOSIS — W010XXA Fall on same level from slipping, tripping and stumbling without subsequent striking against object, initial encounter: Secondary | ICD-10-CM | POA: Diagnosis present

## 2017-01-28 DIAGNOSIS — C9000 Multiple myeloma not having achieved remission: Secondary | ICD-10-CM | POA: Diagnosis not present

## 2017-01-28 DIAGNOSIS — N2581 Secondary hyperparathyroidism of renal origin: Secondary | ICD-10-CM | POA: Diagnosis not present

## 2017-01-28 DIAGNOSIS — I252 Old myocardial infarction: Secondary | ICD-10-CM | POA: Diagnosis not present

## 2017-01-28 DIAGNOSIS — Z841 Family history of disorders of kidney and ureter: Secondary | ICD-10-CM

## 2017-01-28 DIAGNOSIS — D631 Anemia in chronic kidney disease: Secondary | ICD-10-CM | POA: Diagnosis not present

## 2017-01-28 DIAGNOSIS — J440 Chronic obstructive pulmonary disease with acute lower respiratory infection: Secondary | ICD-10-CM | POA: Diagnosis not present

## 2017-01-28 DIAGNOSIS — Y92002 Bathroom of unspecified non-institutional (private) residence single-family (private) house as the place of occurrence of the external cause: Secondary | ICD-10-CM

## 2017-01-28 DIAGNOSIS — R0602 Shortness of breath: Secondary | ICD-10-CM | POA: Diagnosis not present

## 2017-01-28 DIAGNOSIS — E785 Hyperlipidemia, unspecified: Secondary | ICD-10-CM | POA: Diagnosis not present

## 2017-01-28 DIAGNOSIS — K210 Gastro-esophageal reflux disease with esophagitis: Secondary | ICD-10-CM | POA: Diagnosis not present

## 2017-01-28 DIAGNOSIS — N185 Chronic kidney disease, stage 5: Secondary | ICD-10-CM | POA: Diagnosis not present

## 2017-01-28 DIAGNOSIS — Z79899 Other long term (current) drug therapy: Secondary | ICD-10-CM

## 2017-01-28 DIAGNOSIS — Z96642 Presence of left artificial hip joint: Secondary | ICD-10-CM | POA: Diagnosis present

## 2017-01-28 DIAGNOSIS — I129 Hypertensive chronic kidney disease with stage 1 through stage 4 chronic kidney disease, or unspecified chronic kidney disease: Secondary | ICD-10-CM | POA: Diagnosis not present

## 2017-01-28 DIAGNOSIS — W19XXXA Unspecified fall, initial encounter: Secondary | ICD-10-CM

## 2017-01-28 HISTORY — DX: Unspecified systolic (congestive) heart failure: I50.20

## 2017-01-28 LAB — INFLUENZA PANEL BY PCR (TYPE A & B)
INFLAPCR: NEGATIVE
Influenza B By PCR: NEGATIVE

## 2017-01-28 LAB — CBC WITH DIFFERENTIAL/PLATELET
BASOS PCT: 0 %
Basophils Absolute: 0 10*3/uL (ref 0–0.1)
EOS ABS: 0 10*3/uL (ref 0–0.7)
Eosinophils Relative: 0 %
HEMATOCRIT: 26 % — AB (ref 40.0–52.0)
Hemoglobin: 8.9 g/dL — ABNORMAL LOW (ref 13.0–18.0)
Lymphocytes Relative: 2 %
Lymphs Abs: 0.2 10*3/uL — ABNORMAL LOW (ref 1.0–3.6)
MCH: 34 pg (ref 26.0–34.0)
MCHC: 34.4 g/dL (ref 32.0–36.0)
MCV: 98.8 fL (ref 80.0–100.0)
MONO ABS: 0.5 10*3/uL (ref 0.2–1.0)
MONOS PCT: 7 %
NEUTROS ABS: 7.4 10*3/uL — AB (ref 1.4–6.5)
Neutrophils Relative %: 91 %
Platelets: 92 10*3/uL — ABNORMAL LOW (ref 150–440)
RBC: 2.63 MIL/uL — ABNORMAL LOW (ref 4.40–5.90)
RDW: 16 % — AB (ref 11.5–14.5)
WBC: 8.1 10*3/uL (ref 3.8–10.6)

## 2017-01-28 LAB — COMPREHENSIVE METABOLIC PANEL
ALK PHOS: 48 U/L (ref 38–126)
ALT: 12 U/L — AB (ref 17–63)
AST: 29 U/L (ref 15–41)
Albumin: 3.7 g/dL (ref 3.5–5.0)
Anion gap: 13 (ref 5–15)
BILIRUBIN TOTAL: 1.4 mg/dL — AB (ref 0.3–1.2)
BUN: 63 mg/dL — AB (ref 6–20)
CALCIUM: 8.9 mg/dL (ref 8.9–10.3)
CO2: 27 mmol/L (ref 22–32)
Chloride: 92 mmol/L — ABNORMAL LOW (ref 101–111)
Creatinine, Ser: 7.62 mg/dL — ABNORMAL HIGH (ref 0.61–1.24)
GFR calc Af Amer: 7 mL/min — ABNORMAL LOW (ref >60)
GFR calc non Af Amer: 6 mL/min — ABNORMAL LOW (ref >60)
GLUCOSE: 138 mg/dL — AB (ref 65–99)
Potassium: 4.7 mmol/L (ref 3.5–5.1)
SODIUM: 132 mmol/L — AB (ref 135–145)
TOTAL PROTEIN: 7.5 g/dL (ref 6.5–8.1)

## 2017-01-28 LAB — LIPASE, BLOOD: Lipase: 34 U/L (ref 11–51)

## 2017-01-28 LAB — LACTIC ACID, PLASMA: Lactic Acid, Venous: 1.5 mmol/L (ref 0.5–1.9)

## 2017-01-28 MED ORDER — SODIUM CHLORIDE 0.9 % IV SOLN: 250.0000 mL | INTRAVENOUS | Status: DC | PRN

## 2017-01-28 MED ORDER — SODIUM CHLORIDE 0.9% FLUSH
3.0000 mL | Freq: Two times a day (BID) | INTRAVENOUS | Status: DC
  Administered 2017-01-28 – 2017-01-30 (×4): 3 mL via INTRAVENOUS

## 2017-01-28 MED ORDER — IPRATROPIUM-ALBUTEROL 0.5-2.5 (3) MG/3ML IN SOLN
3.0000 mL | Freq: Once | RESPIRATORY_TRACT | Status: AC
  Administered 2017-01-28: 3 mL via RESPIRATORY_TRACT
  Filled 2017-01-28: qty 3

## 2017-01-28 MED ORDER — METOPROLOL TARTRATE 25 MG PO TABS
12.5000 mg | ORAL_TABLET | Freq: Two times a day (BID) | ORAL | Status: DC
  Administered 2017-01-28 – 2017-01-30 (×3): 12.5 mg via ORAL
  Filled 2017-01-28 (×4): qty 1

## 2017-01-28 MED ORDER — SODIUM CHLORIDE 0.9% FLUSH: 3.0000 mL | INTRAVENOUS | Status: DC | PRN

## 2017-01-28 MED ORDER — PANTOPRAZOLE SODIUM 40 MG PO TBEC
40.0000 mg | DELAYED_RELEASE_TABLET | Freq: Every day | ORAL | Status: DC
  Administered 2017-01-28 – 2017-01-30 (×3): 40 mg via ORAL
  Filled 2017-01-28 (×3): qty 1

## 2017-01-28 MED ORDER — PRAVASTATIN SODIUM 20 MG PO TABS
20.0000 mg | ORAL_TABLET | Freq: Every day | ORAL | Status: DC
  Administered 2017-01-28 – 2017-01-29 (×2): 20 mg via ORAL
  Filled 2017-01-28 (×2): qty 1

## 2017-01-28 MED ORDER — METHYLPREDNISOLONE SODIUM SUCC 125 MG IJ SOLR
125.0000 mg | Freq: Once | INTRAMUSCULAR | Status: AC
  Administered 2017-01-28: 125 mg via INTRAVENOUS
  Filled 2017-01-28: qty 2

## 2017-01-28 MED ORDER — DEXTROSE 5 % IV SOLN
500.0000 mg | Freq: Every day | INTRAVENOUS | Status: DC
  Administered 2017-01-28 – 2017-01-29 (×2): 500 mg via INTRAVENOUS
  Filled 2017-01-28 (×2): qty 500

## 2017-01-28 MED ORDER — SEVELAMER CARBONATE 800 MG PO TABS
800.0000 mg | ORAL_TABLET | Freq: Three times a day (TID) | ORAL | Status: DC
  Administered 2017-01-28 – 2017-01-30 (×7): 800 mg via ORAL
  Filled 2017-01-28 (×7): qty 1

## 2017-01-28 MED ORDER — MOMETASONE FURO-FORMOTEROL FUM 100-5 MCG/ACT IN AERO
2.0000 | INHALATION_SPRAY | Freq: Two times a day (BID) | RESPIRATORY_TRACT | Status: DC
  Administered 2017-01-28 – 2017-01-30 (×4): 2 via RESPIRATORY_TRACT
  Filled 2017-01-28: qty 8.8

## 2017-01-28 MED ORDER — DEXTROSE 5 % IV SOLN
500.0000 mg | Freq: Once | INTRAVENOUS | Status: AC
  Administered 2017-01-28: 500 mg via INTRAVENOUS
  Filled 2017-01-28: qty 500

## 2017-01-28 MED ORDER — DEXTROSE 5 % IV SOLN
1.0000 g | Freq: Once | INTRAVENOUS | Status: AC
  Administered 2017-01-28: 1 g via INTRAVENOUS
  Filled 2017-01-28: qty 10

## 2017-01-28 MED ORDER — ASPIRIN EC 81 MG PO TBEC
81.0000 mg | DELAYED_RELEASE_TABLET | Freq: Every day | ORAL | Status: DC
  Administered 2017-01-28 – 2017-01-30 (×3): 81 mg via ORAL
  Filled 2017-01-28 (×3): qty 1

## 2017-01-28 MED ORDER — HEPARIN SODIUM (PORCINE) 5000 UNIT/ML IJ SOLN
5000.0000 [IU] | Freq: Three times a day (TID) | INTRAMUSCULAR | Status: DC
  Administered 2017-01-28 – 2017-01-30 (×6): 5000 [IU] via SUBCUTANEOUS
  Filled 2017-01-28 (×6): qty 1

## 2017-01-28 MED ORDER — DEXTROSE 5 % IV SOLN
1.0000 g | Freq: Every day | INTRAVENOUS | Status: DC
  Administered 2017-01-28 – 2017-01-30 (×3): 1 g via INTRAVENOUS
  Filled 2017-01-28 (×3): qty 10

## 2017-01-28 MED ORDER — METHYLPREDNISOLONE SODIUM SUCC 40 MG IJ SOLR
40.0000 mg | Freq: Every day | INTRAMUSCULAR | Status: DC
  Administered 2017-01-28 – 2017-01-30 (×3): 40 mg via INTRAVENOUS
  Filled 2017-01-28 (×3): qty 1

## 2017-01-28 MED ORDER — DONEPEZIL HCL 5 MG PO TABS
10.0000 mg | ORAL_TABLET | Freq: Every day | ORAL | Status: DC
  Administered 2017-01-28 – 2017-01-29 (×2): 10 mg via ORAL
  Filled 2017-01-28 (×2): qty 2

## 2017-01-28 MED ORDER — IPRATROPIUM-ALBUTEROL 0.5-2.5 (3) MG/3ML IN SOLN
3.0000 mL | Freq: Four times a day (QID) | RESPIRATORY_TRACT | Status: DC
  Administered 2017-01-28 – 2017-01-29 (×7): 3 mL via RESPIRATORY_TRACT
  Filled 2017-01-28 (×8): qty 3

## 2017-01-28 NOTE — Progress Notes (Signed)
Pre hd assessment  

## 2017-01-28 NOTE — Progress Notes (Signed)
Post hd vitals 

## 2017-01-28 NOTE — H&P (Signed)
[facility] [place] NAME: Frank Huang    MR#:  [id number]  DATE OF BIRTH:  09/28/1932  DATE OF ADMISSION:  01/28/2017  PRIMARY CARE PHYSICIAN: Wilhemena Durie, MD   REQUESTING/REFERRING PHYSICIAN:   CHIEF COMPLAINT:   Chief Complaint  Patient presents with  . Shortness of Breath  . Fall    HISTORY OF PRESENT ILLNESS: Frank Huang  is a 83 y.o. male with a known history of COPD not on home oxygen, chronic kidney disease, coronary artery disease, GERD, multiple myeloma, GERD presented to the emergency room for increased shortness of breath. Patient gets dialysis on Monday venous to Friday. Patient missed dialysis on Monday. He also has some cough which is dry in nature. Patient was evaluated in the emergency room and chest x-ray showed right lung infiltrate. No complaints of any chest pain and palpitations. No complaints of any abdominal pain nausea vomiting diarrhea. Patient was given IV Rocephin and Zithromax antibiotics in the emergency room. Bedside lactate level was normal. Hospitalist service was consulted for further care of the patient.  PAST MEDICAL HISTORY:   Past Medical History:  Diagnosis Date  . Cancer ([place])   . Chronic kidney disease   . COPD (chronic obstructive pulmonary disease) ([place])   . Coronary artery disease   . GERD (gastroesophageal reflux disease)   . Multiple myeloma ([place])   . Myocardial infarction 2005  . Neuropathy ([place])   . Shortness of breath dyspnea   . Stroke (cerebrum) ([facility])    weakness Lt hand    PAST SURGICAL HISTORY: Past Surgical History:  Procedure Laterality Date  . APPENDECTOMY    . AV FISTULA PLACEMENT Left   . CARDIAC CATHETERIZATION N/A 11/15/2016   Procedure: Left Heart Cath and Coronary Angiography;  Surgeon: Isaias Cowman, MD;  Location: [facility];  Service: Cardiovascular;  Laterality: N/A;  . CHOLECYSTECTOMY    . SHOULDER ARTHROSCOPY WITH OPEN ROTATOR CUFF REPAIR  Left 09/19/2015   Procedure: SHOULDER ARTHROSCOPY , subacromial decompression, debridement;  Surgeon: Corky Mull, MD;  Location: [facility];  Service: Orthopedics;  Laterality: Left;  . TOTAL HIP ARTHROPLASTY Left     SOCIAL HISTORY:  Social History  Substance Use Topics  . Smoking status: Current Every Day Smoker    Packs/day: 0.50    Types: Cigarettes  . Smokeless tobacco: Never Used     Comment: smokes about 3 cigarettes per week  . Alcohol use No    FAMILY HISTORY:  Family History  Problem Relation Age of Onset  . Alzheimer's disease Mother   . Kidney failure Father   . Bone cancer Sister   . Stomach cancer Sister     DRUG ALLERGIES: No Known Allergies  REVIEW OF SYSTEMS:   CONSTITUTIONAL: No fever, has weakness.  EYES: No blurred or double vision.  EARS, NOSE, AND THROAT: No tinnitus or ear pain.  RESPIRATORY: Has cough, shortness of breath, No wheezing or hemoptysis.  CARDIOVASCULAR: No chest pain, orthopnea,  has edema.  GASTROINTESTINAL: No nausea, vomiting, diarrhea or abdominal pain.  GENITOURINARY: No dysuria, hematuria.  ENDOCRINE: No polyuria, nocturia,  HEMATOLOGY: No anemia, easy bruising or bleeding SKIN: No rash or lesion. MUSCULOSKELETAL: No joint pain or arthritis.   NEUROLOGIC: No tingling, numbness, weakness.  PSYCHIATRY: No anxiety or depression.   MEDICATIONS AT HOME:  Prior to Admission medications   Medication Sig Start Date End Date Taking? Authorizing Provider  aspirin EC 81 MG tablet  Take 81 mg by mouth daily.   Yes Historical Provider, MD  CYANOCOBALAMIN IJ Inject 1 Dose as directed every 30 (thirty) days.   Yes Historical Provider, MD  donepezil (ARICEPT) 10 MG tablet Take 1 tablet (10 mg total) by mouth at bedtime. 01/02/16  Yes Richard Maceo Pro., MD  lidocaine-prilocaine (EMLA) cream  04/18/16  Yes Historical Provider, MD  lovastatin (MEVACOR) 40 MG tablet TAKE 1 TABLET BY MOUTH EVERY DAY FOR HIGH CHOLESTEROL 08/10/14  Yes Historical  Provider, MD  metoprolol tartrate (LOPRESSOR) 25 MG tablet Take 0.5 tablets (12.5 mg total) by mouth 2 (two) times daily. 11/16/16  Yes Vaughan Basta, MD  Multiple Vitamin (MULTI-VITAMINS) TABS Take by mouth. Reported on 04/01/2016   Yes Historical Provider, MD  omeprazole (PRILOSEC) 20 MG capsule TAKE 1 CAPSULE BY MOUTH  DAILY. 11/05/16  Yes Richard Maceo Pro., MD  RENVELA 800 MG tablet Take 800 mg by mouth 3 (three) times daily. 11/05/16  Yes Historical Provider, MD  sucralfate (CARAFATE) 1 g tablet Take 1 tablet (1 g total) by mouth 4 (four) times daily -  before meals and at bedtime. 12/06/16  Yes Richard Maceo Pro., MD      PHYSICAL EXAMINATION:   VITAL SIGNS: Blood pressure (!) 143/72, pulse 87, temperature 99.5 F (37.5 C), temperature source Oral, resp. rate (!) 22, height 5' 10" (1.778 m), weight 86.2 kg (190 lb), SpO2 100 %.  GENERAL:  83 y.o.-year-old patient lying in the bed with no acute distress.  EYES: Pupils equal, round, reactive to light and accommodation. No scleral icterus. Extraocular muscles intact.  HEENT: Head atraumatic, normocephalic. Oropharynx and nasopharynx clear.  NECK:  Supple, no jugular venous distention. No thyroid enlargement, no tenderness.  LUNGS: Decreased breath sounds bilaterally, rales heard in right lung. No use of accessory muscles of respiration.  CARDIOVASCULAR: S1, S2 irregular. No murmurs, rubs, or gallops.  ABDOMEN: Soft, nontender, nondistended. Bowel sounds present. No organomegaly or mass.  EXTREMITIES: Has pedal edema, cyanosis, or clubbing.  NEUROLOGIC: Cranial nerves II through XII are intact. Muscle strength 5/5 in all extremities. Sensation intact. Gait not checked.  PSYCHIATRIC: The patient is alert and oriented x 3.  SKIN: No obvious rash, lesion, or ulcer.   LABORATORY PANEL:   CBC  Recent Labs Lab 01/28/17 0527  WBC 8.1  HGB 8.9*  HCT 26.0*  PLT 92*  MCV 98.8  MCH 34.0  MCHC 34.4  RDW 16.0*  LYMPHSABS 0.2*   MONOABS 0.5  EOSABS 0.0  BASOSABS 0.0   ------------------------------------------------------------------------------------------------------------------  Chemistries   Recent Labs Lab 01/28/17 0458  NA 132*  K 4.7  CL 92*  CO2 27  GLUCOSE 138*  BUN 63*  CREATININE 7.62*  CALCIUM 8.9  AST 29  ALT 12*  ALKPHOS 48  BILITOT 1.4*   ------------------------------------------------------------------------------------------------------------------ estimated creatinine clearance is 7.7 mL/min (A) (by C-G formula based on SCr of 7.62 mg/dL (H)). ------------------------------------------------------------------------------------------------------------------ No results for input(s): TSH, T4TOTAL, T3FREE, THYROIDAB in the last 72 hours.  Invalid input(s): FREET3   Coagulation profile No results for input(s): INR, PROTIME in the last 168 hours. ------------------------------------------------------------------------------------------------------------------- No results for input(s): DDIMER in the last 72 hours. -------------------------------------------------------------------------------------------------------------------  Cardiac Enzymes No results for input(s): CKMB, TROPONINI, MYOGLOBIN in the last 168 hours.  Invalid input(s): CK ------------------------------------------------------------------------------------------------------------------ Invalid input(s): POCBNP  ---------------------------------------------------------------------------------------------------------------  Urinalysis    Component Value Date/Time   COLORURINE Yellow 12/12/2013 1501   APPEARANCEUR Cloudy 12/12/2013 1501   LABSPEC 1.014 12/12/2013 1501  PHURINE 5.0 12/12/2013 1501   GLUCOSEU Negative 12/12/2013 1501   HGBUR 2+ 12/12/2013 1501   BILIRUBINUR Negative 12/12/2013 1501   KETONESUR Negative 12/12/2013 1501   PROTEINUR 100 mg/dL 12/12/2013 1501   NITRITE Negative 12/12/2013 1501    LEUKOCYTESUR 2+ 12/12/2013 1501     RADIOLOGY: Dg Chest 2 View  Result Date: 01/28/2017 CLINICAL DATA:  83 year old male with shortness of breath. Patient fell in the bathroom. EXAM: CHEST  2 VIEW COMPARISON:  12/01/2016 FINDINGS: There is emphysematous changes of the lungs. Confluent area of density in the right lower lobe concerning for developing infiltrate. There is no pleural effusion or pneumothorax. Mild cardiomegaly. The aorta is mildly torturous. There is osteopenia with degenerative changes of the spine. No acute fracture. Left axillary vascular stent. IMPRESSION: Confluent area of airspace opacity at the right lung base concerning for developing pneumonia. Clinical correlation and follow-up recommended. Electronically Signed   By: Anner Crete M.D.   On: 01/28/2017 05:26    EKG: Orders placed or performed during the hospital encounter of 01/28/17  . ED EKG  . ED EKG    IMPRESSION AND PLAN: 83 year old male patient with history of end-stage renal disease on dialysis, GERD, coronary artery disease, COPD, hypertension presented to the emergency room with difficulty breathing and cough. Admitting diagnosis 1. Community-acquired pneumonia 2. Fluid overload 3. End-stage renal disease on dialysis 4. Coronary artery disease 5. Emphysema Treatment plan Admit patient to medical floor with telemetry Nephrology consult for dialysis Start patient on IV Rocephin and Zithromax antibiotics Monitor electrolytes DVT prophylaxis subcutaneous heparin Monitor platelet count Breathing treatments.  All the records are reviewed and case discussed with ED provider. Management plans discussed with the patient, family and they are in agreement.  CODE STATUS:FULL CODE Surrogate decision maker : Wife Code Status History    Date Active Date Inactive Code Status Order ID Comments User Context   11/15/2016  2:30 AM 11/16/2016  5:27 PM Full Code [id number]  Harvie Bridge, DO Inpatient    06/18/2016 11:09 AM 06/19/2016  5:15 AM Full Code [id number]  Sabino Dick, MD HOV   09/19/2015  2:01 PM 09/19/2015  7:18 PM Full Code [id number]  Corky Mull, MD Inpatient   07/01/2015  7:54 AM 07/01/2015  3:59 PM Full Code [id number]  Loletha Grayer, MD ED       TOTAL TIME TAKING CARE OF THIS PATIENT: 50 minutes.    Saundra Shelling M.D on 01/28/2017 at 7:07 AM  Between 7am to 6pm - Pager - [phone]  After 6pm go to [email or web address] - password [email or web address] [facility]  Office  [phone]  CC: Primary care physician; Wilhemena Durie, MD

## 2017-01-28 NOTE — Care Management (Signed)
Chronic HD [facility] .  Notified [name] with [employer]. 02 requirement is acute.  No issues accessing medical care, meds or transportation.

## 2017-01-28 NOTE — Progress Notes (Signed)
[facility]  ROUNDING NOTE   Subjective:   [name] admitted to [facility] on 01/28/2017 for COPD exacerbation [facility]) [J44.1] Fall, initial encounter [W19.XXXA] Stage 5 chronic kidney disease on chronic dialysis (HCC) [N18.6, Z99.2] Anemia in chronic kidney disease, on chronic dialysis ([place]) [N18.6, D63.1, Z99.2] Community acquired pneumonia of right lower lobe of lung ([place]) [J18.1]   Last dialysis was Friday. Missed dialysis yesterday. Complains of fluid overload  Objective:  Vital signs in last 24 hours:  Temp:  [99.2 F (37.3 C)-99.5 F (37.5 C)] 99.2 F (37.3 C) (03/20 0806) Pulse Rate:  [85-90] 85 (03/20 0806) Resp:  [18-25] 18 (03/20 0806) BP: (118-143)/(55-72) 118/55 (03/20 0700) SpO2:  [95 %-100 %] 97 % (03/20 0806) Weight:  [86.2 kg (190 lb)] 86.2 kg (190 lb) (03/20 0454)  Weight change:  Filed Weights   01/28/17 0454  Weight: 86.2 kg (190 lb)    Intake/Output: I/O last 3 completed shifts: In: 43 [IV Piggyback:50] Out: -    Intake/Output this shift:  No intake/output data recorded.  Physical Exam: General: NAD, laying bed  Head: Normocephalic, atraumatic. Moist oral mucosal membranes  Eyes: Anicteric, PERRL  Neck: Supple, trachea midline  Lungs:  Clear to auscultation  Heart: Regular rate and rhythm  Abdomen:  Soft, nontender,   Extremities:  ++ peripheral edema.  Neurologic: Nonfocal, moving all four extremities  Skin: No lesions  Access: Left arm AVF    Basic Metabolic Panel:  Recent Labs Lab 01/28/17 0458  NA 132*  K 4.7  CL 92*  CO2 27  GLUCOSE 138*  BUN 63*  CREATININE 7.62*  CALCIUM 8.9    Liver Function Tests:  Recent Labs Lab 01/28/17 0458  AST 29  ALT 12*  ALKPHOS 48  BILITOT 1.4*  PROT 7.5  ALBUMIN 3.7    Recent Labs Lab 01/28/17 0458  LIPASE 34   No results for input(s): AMMONIA in the last 168 hours.  CBC:  Recent Labs Lab 01/28/17 0527  WBC 8.1  NEUTROABS 7.4*  HGB 8.9*  HCT 26.0*  MCV  98.8  PLT 92*    Cardiac Enzymes: No results for input(s): CKTOTAL, CKMB, CKMBINDEX, TROPONINI in the last 168 hours.  BNP: Invalid input(s): POCBNP  CBG: No results for input(s): GLUCAP in the last 168 hours.  Microbiology: Results for orders placed or performed in visit on 12/10/13  Influenza A&B Antigens [facility])     Status: None   Collection Time: 12/10/13  8:03 PM  Result Value Ref Range Status   Micro Text Report   Final       COMMENT                   NEGATIVE FOR INFLUENZA A (ANTIGEN ABSENT)   COMMENT                   NEGATIVE FOR INFLUENZA B (ANTIGEN ABSENT)   ANTIBIOTIC                                                      Culture, blood (single)     Status: None   Collection Time: 12/10/13  8:03 PM  Result Value Ref Range Status   Micro Text Report   Final       COMMENT  NO GROWTH AEROBICALLY/ANAEROBICALLY IN 5 DAYS   ANTIBIOTIC                                                      Culture, blood (single)     Status: None   Collection Time: 12/10/13  8:03 PM  Result Value Ref Range Status   Micro Text Report   Final       COMMENT                   NO GROWTH AEROBICALLY/ANAEROBICALLY IN 5 DAYS   ANTIBIOTIC                                                      Culture, blood (single)     Status: None   Collection Time: 12/12/13  8:48 AM  Result Value Ref Range Status   Micro Text Report   Final       COMMENT                   NO GROWTH AEROBICALLY/ANAEROBICALLY IN 5 DAYS   ANTIBIOTIC                                                      Urine culture     Status: None   Collection Time: 12/12/13  3:01 PM  Result Value Ref Range Status   Micro Text Report   Final       SOURCE: IN AND OUT CATH    COMMENT                   NO GROWTH IN 36 HOURS   ANTIBIOTIC                                                        Coagulation Studies: No results for input(s): LABPROT, INR in the last 72 hours.  Urinalysis: No results for  input(s): COLORURINE, LABSPEC, PHURINE, GLUCOSEU, HGBUR, BILIRUBINUR, KETONESUR, PROTEINUR, UROBILINOGEN, NITRITE, LEUKOCYTESUR in the last 72 hours.  Invalid input(s): APPERANCEUR    Imaging: Dg Chest 2 View  Result Date: 01/28/2017 CLINICAL DATA:  83 year old male with shortness of breath. Patient fell in the bathroom. EXAM: CHEST  2 VIEW COMPARISON:  12/01/2016 FINDINGS: There is emphysematous changes of the lungs. Confluent area of density in the right lower lobe concerning for developing infiltrate. There is no pleural effusion or pneumothorax. Mild cardiomegaly. The aorta is mildly torturous. There is osteopenia with degenerative changes of the spine. No acute fracture. Left axillary vascular stent. IMPRESSION: Confluent area of airspace opacity at the right lung base concerning for developing pneumonia. Clinical correlation and follow-up recommended. Electronically Signed   By: Anner Crete M.D.   On: 01/28/2017 05:26     Medications:    . aspirin EC  81 mg Oral Daily  .  azithromycin  500 mg Intravenous Daily  . cefTRIAXone (ROCEPHIN)  IV  1 g Intravenous Daily  . donepezil  10 mg Oral QHS  . heparin  5,000 Units Subcutaneous Q8H  . ipratropium-albuterol  3 mL Nebulization Q6H  . metoprolol tartrate  12.5 mg Oral BID  . pantoprazole  40 mg Oral Daily  . pravastatin  20 mg Oral q1800  . sevelamer carbonate  800 mg Oral TID  . sodium chloride flush  3 mL Intravenous Q12H   sodium chloride, sodium chloride flush  Assessment/ Plan:  [name]  with ESRD on hemodialysis, multiple myeloma, CAD, CVA, COPD , A fib admitted on 01/28/2017   [facility] [address] [facility]/ [facility]/ MWF  1. ESRD: MWF however with fluid overload. Will plan on dialysis today and then again resume MWF schedule.   2. Hypertension: blood pressure at goal. With a-fib - metoprolol  3. Anemia of chronic kidney disease: hemoglobin 8.9 - [facility] as outpatient.   4. Secondary  Hyperparathyroidism:  - sevelamer for binding   LOS: 0 Frank Huang 3/20/201811:55 AM

## 2017-01-28 NOTE — ED Provider Notes (Signed)
[facility] Emergency Department Provider Note   ____________________________________________   First MD Initiated Contact with Patient 01/28/17 0530     (approximate)  I have reviewed the triage vital signs and the nursing notes.   HISTORY  Chief Complaint Shortness of Breath and Fall    HPI Frank Huang is a 83 y.o. male brought to the ED via EMS from home with a chief complaint of fall and shortness of breath. Patient has a history of renal failure on hemodialysis who missed his dialysis yesterday secondary to not feeling well. Patient complains of malaise, generalized weakness, nonproductive cough and shortness of breath.States he fell secondary to slipping on wet spot in bathroom. Did not strike head or suffer LOC. Voices no injury secondary to fall. Denies fever, chills, chest pain, abdominal pain, nausea, vomiting, diarrhea. Denies recent travel.   Past Medical History:  Diagnosis Date  . Cancer ([place])   . Chronic kidney disease   . COPD (chronic obstructive pulmonary disease) ([place])   . Coronary artery disease   . GERD (gastroesophageal reflux disease)   . Multiple myeloma ([place])   . Myocardial infarction 2005  . Neuropathy ([place])   . Shortness of breath dyspnea   . Stroke (cerebrum) ([facility])    weakness Lt hand    Patient Active Problem List   Diagnosis Date Noted  . NSTEMI (non-ST elevated myocardial infarction) ([place]) 11/15/2016  . Chest pain, rule out acute myocardial infarction 11/14/2016  . Parotid mass 05/17/2016  . Nodule of right lung 05/11/2016  . Persistent atrial fibrillation ([place]) 02/05/2016  . B12 deficiency 07/25/2015  . Deficiency of vitamin B 07/25/2015  . Chest pain 07/01/2015  . Multiple myeloma ([place]) 04/12/2015  . Arteriosclerosis of coronary artery 04/09/2015  . CAFL (chronic airflow limitation) ([place]) 04/09/2015  . Chronic kidney disease requiring chronic dialysis ([place]) 04/09/2015  . Gastro-esophageal reflux disease  without esophagitis 04/09/2015  . Gout 04/09/2015  . H/O acute myocardial infarction 04/09/2015  . HLD (hyperlipidemia) 04/09/2015  . BP (high blood pressure) 04/09/2015  . Bad memory 04/09/2015  . 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 Kidney failure 04/09/2015  . End-stage renal disease ([place]) 04/09/2015  . Chronic kidney disease (CKD), stage V ([place]) 07/20/2013  . Chronic kidney disease, stage V ([place]) 07/20/2013  . Absolute anemia 03/31/2013  . Neuropathy ([place]) 03/31/2013    Past Surgical History:  Procedure Laterality Date  . APPENDECTOMY    . AV FISTULA PLACEMENT Left   . CARDIAC CATHETERIZATION N/A 11/15/2016   Procedure: Left Heart Cath and Coronary Angiography;  Surgeon: Isaias Cowman, MD;  Location: [facility];  Service: Cardiovascular;  Laterality: N/A;  . CHOLECYSTECTOMY    . SHOULDER ARTHROSCOPY WITH OPEN ROTATOR CUFF REPAIR Left 09/19/2015   Procedure: SHOULDER ARTHROSCOPY , subacromial decompression, debridement;  Surgeon: Corky Mull, MD;  Location: [facility];  Service: Orthopedics;  Laterality: Left;  . TOTAL HIP ARTHROPLASTY Left     Prior to Admission medications   Medication Sig Start Date End Date Taking? Authorizing Provider  aspirin EC 81 MG tablet Take 81 mg by mouth daily.   Yes Historical Provider, MD  CYANOCOBALAMIN IJ Inject 1 Dose as directed every 30 (thirty) days.   Yes Historical Provider, MD  donepezil (ARICEPT) 10 MG tablet Take 1 tablet (10 mg total) by mouth at bedtime. 01/02/16  Yes Richard Maceo Pro., MD  lidocaine-prilocaine (EMLA) cream  04/18/16  Yes Historical Provider,  MD  lovastatin (MEVACOR) 40 MG tablet TAKE 1 TABLET BY MOUTH EVERY DAY FOR HIGH CHOLESTEROL 08/10/14  Yes Historical Provider, MD  metoprolol tartrate (LOPRESSOR) 25 MG tablet Take 0.5 tablets (12.5 mg total) by mouth 2 (two) times daily. 11/16/16  Yes Vaughan Basta, MD  Multiple Vitamin (MULTI-VITAMINS) TABS Take by mouth.  Reported on 04/01/2016   Yes Historical Provider, MD  omeprazole (PRILOSEC) 20 MG capsule TAKE 1 CAPSULE BY MOUTH  DAILY. 11/05/16  Yes Richard Maceo Pro., MD  RENVELA 800 MG tablet Take 800 mg by mouth 3 (three) times daily. 11/05/16  Yes Historical Provider, MD  sucralfate (CARAFATE) 1 g tablet Take 1 tablet (1 g total) by mouth 4 (four) times daily -  before meals and at bedtime. 12/06/16  Yes Richard Maceo Pro., MD    Allergies Patient has no known allergies.  Family History  Problem Relation Age of Onset  . Alzheimer's disease Mother   . Kidney failure Father   . Bone cancer Sister   . Stomach cancer Sister     Social History Social History  Substance Use Topics  . Smoking status: Current Every Day Smoker    Packs/day: 0.50    Types: Cigarettes  . Smokeless tobacco: Never Used     Comment: smokes about 3 cigarettes per week  . Alcohol use No    Review of Systems  Constitutional: No fever/chills. Eyes: No visual changes. ENT: No sore throat. Cardiovascular: Denies chest pain. Respiratory: Positive for cough and shortness of breath. Gastrointestinal: No abdominal pain.  No nausea, no vomiting.  No diarrhea.  No constipation. Genitourinary: Negative for dysuria. Musculoskeletal: Negative for back pain. Skin: Negative for rash. Neurological: Negative for headaches, focal weakness or numbness.  10-point ROS otherwise negative.  ____________________________________________   PHYSICAL EXAM:  VITAL SIGNS: ED Triage Vitals  Enc Vitals Group     BP 01/28/17 0453 (!) 143/72     Pulse Rate 01/28/17 0453 87     Resp 01/28/17 0453 (!) 22     Temp 01/28/17 0453 99.5 F (37.5 C)     Temp Source 01/28/17 0453 Oral     SpO2 01/28/17 0453 100 %     Weight 01/28/17 0454 190 lb (86.2 kg)     Height 01/28/17 0454 _0  (1.778 m)     Head Circumference --      Peak Flow --      Pain Score --      Pain Loc --      Pain Edu? --      Excl. in [place]? --      Constitutional: Alert and oriented. Frail appearing and in mild acute distress. Eyes: Conjunctivae are normal. PERRL. EOMI. Head: Atraumatic. Nose: No congestion/rhinnorhea. Mouth/Throat: Mucous membranes are moist.  Oropharynx non-erythematous. Neck: No stridor.   Cardiovascular: Normal rate, regular rhythm. Grossly normal heart sounds.  Good peripheral circulation. Respiratory: Increased respiratory effort.  No retractions. Lungs with diffuse wheezing and rhonchi. Gastrointestinal: Soft and nontender. No distention. No abdominal bruits. No CVA tenderness. Musculoskeletal: No lower extremity tenderness nor edema.  No joint effusions. Neurologic:  Normal speech and language. No gross focal neurologic deficits are appreciated.  Skin:  Skin is warm, dry and intact. No rash noted. Psychiatric: Mood and affect are normal. Speech and behavior are normal.  ____________________________________________   LABS (all labs ordered are listed, but only abnormal results are displayed)  Labs Reviewed  COMPREHENSIVE METABOLIC PANEL - Abnormal; Notable for the following:  Result Value   Sodium 132 (*)    Chloride 92 (*)    Glucose, Bld 138 (*)    BUN 63 (*)    Creatinine, Ser 7.62 (*)    ALT 12 (*)    Total Bilirubin 1.4 (*)    GFR calc non Af Amer 6 (*)    GFR calc Af Amer 7 (*)    All other components within normal limits  CBC WITH DIFFERENTIAL/PLATELET - Abnormal; Notable for the following:    RBC 2.63 (*)    Hemoglobin 8.9 (*)    HCT 26.0 (*)    RDW 16.0 (*)    Platelets 92 (*)    Neutro Abs 7.4 (*)    Lymphs Abs 0.2 (*)    All other components within normal limits  CULTURE, BLOOD (ROUTINE X 2)  CULTURE, BLOOD (ROUTINE X 2)  LIPASE, BLOOD  CBC WITH DIFFERENTIAL/PLATELET  LACTIC ACID, PLASMA  LACTIC ACID, PLASMA  INFLUENZA PANEL BY PCR (TYPE A & B)   ____________________________________________  EKG  ED ECG REPORT I, Cal[place] Schader J, the attending physician, personally  viewed and interpreted this ECG.   Date: 01/28/2017  EKG Time: 0441  Rate: 94  Rhythm: atrial fibrillation, rate 94  Axis: LAD  Intervals:left anterior fascicular block  ST&T Change: Nonspecific  ____________________________________________  RADIOLOGY  Chest 2 view interpreted per Dr. Quintella Reichert: Confluent area of airspace opacity at the right lung base concerning  for developing pneumonia. Clinical correlation and follow-up  recommended.   ____________________________________________   PROCEDURES  Procedure(s) performed: None  Procedures  Critical Care performed: No  ____________________________________________   INITIAL IMPRESSION / ASSESSMENT AND PLAN / ED COURSE  Pertinent labs & imaging results that were available during my care of the patient were reviewed by me and considered in my medical decision making (see chart for details).  83 year old male presenting with cough, shortness of breath with a history of renal failure status post missed dialysis and COPD. Patient received DuoNeb per EMS. He is still wheezing diffusely; will administer second DuoNeb and IV Solu-Medrol. Does not currently meet sepsis criteria but certainly concerning for SIRS secondary to pneumonia. Will add blood cultures and lactate.  Clinical Course as of Jan 29 648  Tue Jan 28, 2017  0616 Updated patient and spouse of laboratory imaging results. IV antibiotic ordered. Anemia noted which is likely secondary to chronic renal failure as patient denies bloody stools. Will discuss with hospitalist to evaluate patient in the emergency department for admission.  [JS]    Clinical Course User Index [JS] Paulette Blanch, MD     ____________________________________________   FINAL CLINICAL IMPRESSION(S) / ED DIAGNOSES  Final diagnoses:  Fall, initial encounter  COPD exacerbation ([place])  Community acquired pneumonia of right lower lobe of lung ([place])  Stage 5 chronic kidney disease on chronic  dialysis ([place])  Anemia in chronic kidney disease, on chronic dialysis ([place])      NEW MEDICATIONS STARTED DURING THIS VISIT:  New Prescriptions   No medications on file     Note:  This document was prepared using Dragon voice recognition software and may include unintentional dictation errors.    Paulette Blanch, MD 01/28/17 [phone]

## 2017-01-28 NOTE — ED Notes (Signed)
Dr. Pyreddy at bedside at this time.  

## 2017-01-28 NOTE — Progress Notes (Signed)
Hd start 

## 2017-01-28 NOTE — Progress Notes (Signed)
Patient ID: Frank Huang, male   DOB: Apr 29, 1932, 83 y.o.   MRN: [id number]  [employer] PROGRESS NOTE  Frank Huang JME:[id number] DOB: 1932-06-13 DOA: 01/28/2017 PCP: Wilhemena Durie, MD  HPI/Subjective: Patient did not feel well on Sunday and did not go to dialysis on Monday. They tried to reschedule their dialysis for Tuesday but on early Tuesday morning he ended up in the ER with shortness of breath. He also had a fall in the bathroom at 3 AM. He feels like he is breathing a little bit better now.  Objective: Vitals:   01/28/17 0700 01/28/17 0806  BP: (!) 118/55   Pulse: 90 85  Resp: (!) 25 18  Temp:  99.2 F (37.3 C)    Filed Weights   01/28/17 0454  Weight: 86.2 kg (190 lb)    ROS: Review of Systems  Constitutional: Negative for chills and fever.  Eyes: Negative for blurred vision.  Respiratory: Positive for cough, shortness of breath and wheezing.   Cardiovascular: Negative for chest pain.  Gastrointestinal: Negative for abdominal pain, constipation, diarrhea, nausea and vomiting.  Genitourinary: Negative for dysuria.  Musculoskeletal: Negative for joint pain.  Neurological: Negative for dizziness and headaches.   Exam: Physical Exam  Constitutional: He is oriented to person, place, and time.  HENT:  Nose: No mucosal edema.  Mouth/Throat: No oropharyngeal exudate or posterior oropharyngeal edema.  Eyes: Conjunctivae, EOM and lids are normal. Pupils are equal, round, and reactive to light.  Neck: No JVD present. Carotid bruit is not present. No edema present. No thyroid mass and no thyromegaly present.  Cardiovascular: S1 normal and S2 normal.  Exam reveals no gallop.   Murmur heard.  Systolic murmur is present with a grade of 2/6  Pulses:      Dorsalis pedis pulses are 2+ on the right side, and 2+ on the left side.  Respiratory: No respiratory distress. He has decreased breath sounds in the right middle field, the right lower field, the left middle field and  the left lower field. He has wheezes in the right middle field and the left middle field. He has no rhonchi. He has rales in the right lower field and the left lower field.  GI: Soft. Bowel sounds are normal. There is no tenderness.  Musculoskeletal:       Right ankle: He exhibits swelling.       Left ankle: He exhibits swelling.  Lymphadenopathy:    He has no cervical adenopathy.  Neurological: He is alert and oriented to person, place, and time. No cranial nerve deficit.  Skin: Skin is warm. No rash noted. Nails show no clubbing.  Back of right shoulder is scraped from fall.  Psychiatric: He has a normal mood and affect.      Data Reviewed: Basic Metabolic Panel:  Recent Labs Lab 01/28/17 0458  NA 132*  K 4.7  CL 92*  CO2 27  GLUCOSE 138*  BUN 63*  CREATININE 7.62*  CALCIUM 8.9   Liver Function Tests:  Recent Labs Lab 01/28/17 0458  AST 29  ALT 12*  ALKPHOS 48  BILITOT 1.4*  PROT 7.5  ALBUMIN 3.7    Recent Labs Lab 01/28/17 0458  LIPASE 34   CBC:  Recent Labs Lab 01/28/17 0527  WBC 8.1  NEUTROABS 7.4*  HGB 8.9*  HCT 26.0*  MCV 98.8  PLT 92*   BNP (last 3 results)  Recent Labs  04/11/16 1537  BNP 763.7*  Studies: Dg Chest 2 View  Result Date: 01/28/2017 CLINICAL DATA:  83 year old male with shortness of breath. Patient fell in the bathroom. EXAM: CHEST  2 VIEW COMPARISON:  12/01/2016 FINDINGS: There is emphysematous changes of the lungs. Confluent area of density in the right lower lobe concerning for developing infiltrate. There is no pleural effusion or pneumothorax. Mild cardiomegaly. The aorta is mildly torturous. There is osteopenia with degenerative changes of the spine. No acute fracture. Left axillary vascular stent. IMPRESSION: Confluent area of airspace opacity at the right lung base concerning for developing pneumonia. Clinical correlation and follow-up recommended. Electronically Signed   By: Anner Crete M.D.   On:  01/28/2017 05:26    Scheduled Meds: . aspirin EC  81 mg Oral Daily  . azithromycin  500 mg Intravenous Daily  . cefTRIAXone (ROCEPHIN)  IV  1 g Intravenous Daily  . donepezil  10 mg Oral QHS  . heparin  5,000 Units Subcutaneous Q8H  . ipratropium-albuterol  3 mL Nebulization Q6H  . metoprolol tartrate  12.5 mg Oral BID  . pantoprazole  40 mg Oral Daily  . pravastatin  20 mg Oral q1800  . sevelamer carbonate  800 mg Oral TID  . sodium chloride flush  3 mL Intravenous Q12H    Assessment/Plan:  1. Acute systolic congestive heart failure with moderate to severe mitral regurgitation. Likely from missed dialysis session. Dialysis today and tomorrow and reevaluate. On metoprolol. Obtain echo 2. COPD exacerbation. Continue nebulizer treatments. Add daily Solu-Medrol and dulera inhaler 3. Pneumonia. On Rocephin and Zithromax 4. Essential hypertension on metoprolol 5. Atrial fibrillation. This looks new for him. Will get cardiology consultation. Patient on an aspirin. Will let cardiology decide on anticoagulation. 6. Hyperlipidemia unspecified on pravastatin 7. Weakness and fall we'll get physical therapy evaluation 8. End-stage renal disease on dialysis today  Code Status:     Code Status Orders        Start     Ordered   01/28/17 0758  Full code  Continuous     01/28/17 0757    Code Status History    Date Active Date Inactive Code Status Order ID Comments User Context   11/15/2016  2:30 AM 11/16/2016  5:27 PM Full Code [id number]  Harvie Bridge, DO Inpatient   06/18/2016 11:09 AM 06/19/2016  5:15 AM Full Code [id number]  Sabino Dick, MD HOV   09/19/2015  2:01 PM 09/19/2015  7:18 PM Full Code [id number]  Corky Mull, MD Inpatient   07/01/2015  7:54 AM 07/01/2015  3:59 PM Full Code [id number]  Loletha Grayer, MD ED     Family Communication: Wife at the bedside Disposition Plan: To be determined  Consultants:  Nephrology  Will get cardiology consultation  Antibiotics:  Rocephin     Zithromax   Time spent: 35 minutes  [place], [place]

## 2017-01-28 NOTE — Progress Notes (Signed)
Pre HD  

## 2017-01-28 NOTE — ED Triage Notes (Signed)
Per EMS, pt fell this AM in bathroom, pt states that he tripped and fell.  Upon arrival, EMS states that he was short of breath.  Pt is HD patient with L AV fistula, upon arrival it was noted by this RN that EMS started IV in L wrist.  IV was removed and IV replaced by Marcy Salvo, RN in R wrist.  EMS reports giving 1 albuterol and 1 duoneb in route.  Pt did not receive any medications in IV that EMS placed.  Pt denies any pain from falling.  Pt states that he missed HD yesterday due to having vomiting for 3 hours yesterday.  Pt denies n/v at this time.  Pt states that his wife rescheduled his HD for this AM at approx 6 or 7am.

## 2017-01-28 NOTE — Progress Notes (Signed)
  End of hd 

## 2017-01-28 NOTE — Progress Notes (Signed)
Post hd assessment 

## 2017-01-29 ENCOUNTER — Encounter: Payer: Self-pay | Admitting: Internal Medicine

## 2017-01-29 LAB — BASIC METABOLIC PANEL
ANION GAP: 14 (ref 5–15)
BUN: 39 mg/dL — ABNORMAL HIGH (ref 6–20)
CALCIUM: 9 mg/dL (ref 8.9–10.3)
CO2: 30 mmol/L (ref 22–32)
Chloride: 95 mmol/L — ABNORMAL LOW (ref 101–111)
Creatinine, Ser: 5.09 mg/dL — ABNORMAL HIGH (ref 0.61–1.24)
GFR calc Af Amer: 11 mL/min — ABNORMAL LOW (ref >60)
GFR, EST NON AFRICAN AMERICAN: 9 mL/min — AB (ref >60)
GLUCOSE: 131 mg/dL — AB (ref 65–99)
Potassium: 4.4 mmol/L (ref 3.5–5.1)
SODIUM: 137 mmol/L (ref 135–145)

## 2017-01-29 LAB — ECHOCARDIOGRAM COMPLETE
HEIGHTINCHES: 70 in
Weight: 3139.35 oz

## 2017-01-29 LAB — CBC
HCT: 25.8 % — ABNORMAL LOW (ref 40.0–52.0)
HEMOGLOBIN: 8.7 g/dL — AB (ref 13.0–18.0)
MCH: 33.3 pg (ref 26.0–34.0)
MCHC: 33.6 g/dL (ref 32.0–36.0)
MCV: 99 fL (ref 80.0–100.0)
Platelets: 87 10*3/uL — ABNORMAL LOW (ref 150–440)
RBC: 2.6 MIL/uL — ABNORMAL LOW (ref 4.40–5.90)
RDW: 16.5 % — AB (ref 11.5–14.5)
WBC: 9.3 10*3/uL (ref 3.8–10.6)

## 2017-01-29 MED ORDER — IPRATROPIUM-ALBUTEROL 0.5-2.5 (3) MG/3ML IN SOLN
3.0000 mL | Freq: Three times a day (TID) | RESPIRATORY_TRACT | Status: DC
  Administered 2017-01-30: 3 mL via RESPIRATORY_TRACT
  Filled 2017-01-29: qty 3

## 2017-01-29 MED ORDER — DIPHENHYDRAMINE HCL 25 MG PO CAPS
25.0000 mg | ORAL_CAPSULE | Freq: Every evening | ORAL | Status: DC | PRN
  Filled 2017-01-29: qty 1

## 2017-01-29 MED ORDER — AZITHROMYCIN 250 MG PO TABS
500.0000 mg | ORAL_TABLET | Freq: Every day | ORAL | Status: DC
  Administered 2017-01-30: 500 mg via ORAL
  Filled 2017-01-29: qty 2

## 2017-01-29 MED ORDER — IPRATROPIUM-ALBUTEROL 0.5-2.5 (3) MG/3ML IN SOLN: 3.0000 mL | Freq: Four times a day (QID) | RESPIRATORY_TRACT | Status: DC | PRN

## 2017-01-29 MED ORDER — FUROSEMIDE 40 MG PO TABS
80.0000 mg | ORAL_TABLET | ORAL | Status: DC
  Administered 2017-01-30: 80 mg via ORAL
  Filled 2017-01-29: qty 2

## 2017-01-29 NOTE — Progress Notes (Signed)
Pre HD  

## 2017-01-29 NOTE — Consult Note (Signed)
[facility] Consultation Note  Patient ID: Frank Huang, MRN: [id number], DOB/AGE: 81-May-1933 81 y.o. Admit date: 01/28/2017   Date of Consult: 01/29/2017 Primary Physician: Wilhemena Durie, MD Primary Cardiologist: Fath  Chief Complaint:  Chief Complaint  Patient presents with  . Shortness of Breath  . Fall   Reason for Consult: atrial fibrillation  HPI: 83 y.o. male with known chronic nonvalvular atrial fibrillation coronary artery disease status post previous myocardial infarction chronic kidney disease stage V with anemia and new onset weakness fatigue and shortness of breath concerning for pneumonia. The patient has had significant new onset of cough and congestion consistent with pneumonia and now is on antibiotics with improvements of the symptoms. He also had some significant weakness for which the patient had a fall. After his fall he did not hurt himself at all and had not had any residual issues. Previous echocardiogram has shown moderate systolic dysfunction and with ejection fraction of 35% and inferior posterior myocardial infarction with a stress test also showing a fixed inferior myocardial perfusion defect. Currently the patient has had reasonable heart rate control of his atrial fibrillation with an EKG showing atrial fibrillation left axis deviation with preventricular contractions. There is no evidence of heart failure or angina at this time  Past Medical History:  Diagnosis Date  . Cancer ([place])   . Chronic kidney disease   . COPD (chronic obstructive pulmonary disease) ([place])   . Coronary artery disease   . GERD (gastroesophageal reflux disease)   . Multiple myeloma ([place])   . Myocardial infarction 2005  . Neuropathy ([place])   . Shortness of breath dyspnea   . Stroke (cerebrum) ([facility])    weakness Lt hand      Surgical History:  Past Surgical History:  Procedure Laterality Date  . APPENDECTOMY    . AV FISTULA PLACEMENT Left   . CARDIAC  CATHETERIZATION N/A 11/15/2016   Procedure: Left Heart Cath and Coronary Angiography;  Surgeon: Isaias Cowman, MD;  Location: [facility];  Service: Cardiovascular;  Laterality: N/A;  . CHOLECYSTECTOMY    . SHOULDER ARTHROSCOPY WITH OPEN ROTATOR CUFF REPAIR Left 09/19/2015   Procedure: SHOULDER ARTHROSCOPY , subacromial decompression, debridement;  Surgeon: Corky Mull, MD;  Location: [facility] OR[facility];  Service: Orthopedics;  Laterality: Left;  . TOTAL HIP ARTHROPLASTY Left      Home Meds: Prior to Admission medications   Medication Sig Start Date End Date Taking? Authorizing Provider  aspirin EC 81 MG tablet Take 81 mg by mouth daily.   Yes Historical Provider, MD  CYANOCOBALAMIN IJ Inject 1 Dose as directed every 30 (thirty) days.   Yes Historical Provider, MD  donepezil (ARICEPT) 10 MG tablet Take 1 tablet (10 mg total) by mouth at bedtime. 01/02/16  Yes Richard Maceo Pro., MD  lidocaine-prilocaine (EMLA) cream  04/18/16  Yes Historical Provider, MD  lovastatin (MEVACOR) 40 MG tablet TAKE 1 TABLET BY MOUTH EVERY DAY FOR HIGH CHOLESTEROL 08/10/14  Yes Historical Provider, MD  metoprolol tartrate (LOPRESSOR) 25 MG tablet Take 0.5 tablets (12.5 mg total) by mouth 2 (two) times daily. 11/16/16  Yes Vaughan Basta, MD  Multiple Vitamin (MULTI-VITAMINS) TABS Take by mouth. Reported on 04/01/2016   Yes Historical Provider, MD  omeprazole (PRILOSEC) 20 MG capsule TAKE 1 CAPSULE BY MOUTH  DAILY. 11/05/16  Yes Richard Maceo Pro., MD  RENVELA 800 MG tablet Take 800 mg by mouth 3 (three) times daily. 11/05/16  Yes Historical Provider, MD  sucralfate (CARAFATE) 1 g tablet Take 1 tablet (1 g total) by mouth 4 (four) times daily -  before meals and at bedtime. 12/06/16  Yes Richard Maceo Pro., MD    Inpatient Medications:  . aspirin EC  81 mg Oral Daily  . azithromycin  500 mg Intravenous Daily  . cefTRIAXone (ROCEPHIN)  IV  1 g Intravenous Daily  . donepezil  10 mg Oral QHS  . heparin   5,000 Units Subcutaneous Q8H  . ipratropium-albuterol  3 mL Nebulization Q6H  . methylPREDNISolone (SOLU-MEDROL) injection  40 mg Intravenous Daily  . metoprolol tartrate  12.5 mg Oral BID  . mometasone-formoterol  2 puff Inhalation BID  . pantoprazole  40 mg Oral Daily  . pravastatin  20 mg Oral q1800  . sevelamer carbonate  800 mg Oral TID  . sodium chloride flush  3 mL Intravenous Q12H     Allergies: No Known Allergies  Social History   Social History  . Marital status: Married    Spouse name: N/A  . Number of children: N/A  . Years of education: N/A   Occupational History  . Not on file.   Social History Main Topics  . Smoking status: Current Every Day Smoker    Packs/day: 0.50    Types: Cigarettes  . Smokeless tobacco: Never Used     Comment: smokes about 3 cigarettes per week  . Alcohol use No  . Drug use: No  . Sexual activity: Not on file   Other Topics Concern  . Not on file   Social History Narrative  . No narrative on file     Family History  Problem Relation Age of Onset  . Alzheimer's disease Mother   . Kidney failure Father   . Bone cancer Sister   . Stomach cancer Sister      Review of Systems Positive for Fall shortness of breath cough congestion Negative for: General:  chills, fever, night sweats or weight changes.  Cardiovascular: PND orthopnea syncope dizziness  Dermatological skin lesions rashes Respiratory: Positive for Cough congestion Urologic: Frequent urination urination at night and hematuria Abdominal: negative for nausea, vomiting, diarrhea, bright red blood per rectum, melena, or hematemesis Neurologic: negative for visual changes, and/or hearing changes  All other systems reviewed and are otherwise negative except as noted above.  Labs: No results for input(s): CKTOTAL, CKMB, TROPONINI in the last 72 hours. Lab Results  Component Value Date   WBC 9.3 01/29/2017   HGB 8.7 (L) 01/29/2017   HCT 25.8 (L) 01/29/2017   MCV  99.0 01/29/2017   PLT 87 (L) 01/29/2017    Recent Labs Lab 01/28/17 0458 01/29/17 0416  NA 132* 137  K 4.7 4.4  CL 92* 95*  CO2 27 30  BUN 63* 39*  CREATININE 7.62* 5.09*  CALCIUM 8.9 9.0  PROT 7.5  --   BILITOT 1.4*  --   ALKPHOS 48  --   ALT 12*  --   AST 29  --   GLUCOSE 138* 131*   Lab Results  Component Value Date   CHOL 128 11/15/2016   HDL 43 11/15/2016   LDLCALC 73 11/15/2016   TRIG 58 11/15/2016   No results found for: DDIMER  Radiology/Studies:  Dg Chest 2 View  Result Date: 01/28/2017 CLINICAL DATA:  83 year old male with shortness of breath. Patient fell in the bathroom. EXAM: CHEST  2 VIEW COMPARISON:  12/01/2016 FINDINGS: There is emphysematous changes of the lungs. Confluent area of density  in the right lower lobe concerning for developing infiltrate. There is no pleural effusion or pneumothorax. Mild cardiomegaly. The aorta is mildly torturous. There is osteopenia with degenerative changes of the spine. No acute fracture. Left axillary vascular stent. IMPRESSION: Confluent area of airspace opacity at the right lung base concerning for developing pneumonia. Clinical correlation and follow-up recommended. Electronically Signed   By: Anner Crete M.D.   On: 01/28/2017 05:26   US Soft Tissue Head And Neck  Result Date: 12/31/2016 CLINICAL DATA:  Right parotid mass, hypermetabolic on previous PET-CT EXAM: ULTRASOUND OF HEAD/NECK SOFT TISSUES TECHNIQUE: Ultrasound examination of the head and neck soft tissues was performed in the area of clinical concern. COMPARISON:  Ultrasound 06/18/2016, PET-CT  05/16/2016 FINDINGS: Well-demarcated 1.6 x 1.1 x 1 cm hypoechoic nodule, right parotid, previously at least 1.2 cm. No new lesion identified. Remainder of visualized parenchyma unremarkable. Limited contralateral left parotid images unremarkable. IMPRESSION: 1. Persistent 1.6 cm right parotid nodule. Electronically Signed   By: Lucrezia Europe M.D.   On: 12/31/2016 08:13     EKG: Atrial fibrillation with left axis deviation preventricular contractions septal defect consistent with previous infarct with nonspecific ST and T-wave changes  Weights: Filed Weights   01/28/17 0454 01/28/17 1558  Weight: 86.2 kg (190 lb) 89 kg (196 lb 3.4 oz)     Physical Exam: Blood pressure (!) 99/45, pulse 78, temperature 98.2 F (36.8 C), temperature source Oral, resp. rate 18, height _0  (1.778 m), weight 89 kg (196 lb 3.4 oz), SpO2 94 %. Body mass index is 28.15 kg/m. General: Well developed, well nourished, in no acute distress. Head eyes ears nose throat: Normocephalic, atraumatic, sclera non-icteric, no xanthomas, nares are without discharge. No apparent thyromegaly and/or mass  Lungs: Normal respiratory effort.  Diffuse wheezes, no rales, some rhonchi.  Heart: Irregular with normal S1 S2. no murmur gallop, no rub, PMI is normal size and placement, carotid upstroke normal without bruit, jugular venous pressure is normal Abdomen: Soft, non-tender, non-distended with normoactive bowel sounds. No hepatomegaly. No rebound/guarding. No obvious abdominal masses. Abdominal aorta is normal size without bruit Extremities: Trace edema. no cyanosis, no clubbing, no ulcers  Peripheral : 2+ bilateral upper extremity pulses, 2+ bilateral femoral pulses, 2+ bilateral dorsal pedal pulse Neuro: Alert and oriented. No facial asymmetry. No focal deficit. Moves all extremities spontaneously. Musculoskeletal: Normal muscle tone with kyphosis Psych:  Responds to questions appropriately with a normal affect.    Assessment: 83 year old male with known coronary disease previous coronary artery disease myocardial infarction essential hypertension makes hyperlipidemia chronic kidney disease and anemia and chronic atrial fibrillation now controlled despite acute pneumonia. No current evidence of heart failure or myocardial infarction  Plan: 1. Continue supportive care and treatment of  pneumonia 2. No change in metoprolol at current medication management dosage for heart rate control of atrial fibrillation 3. Abstain from anticoagulation due to patient wishes and significant anemia and chronic kidney disease 4. Other cardiac intervention at this time due to good heart rate control and no evidence of myocardial infarction or heart failure 5. Continue ambulation and follow for need for adjustments of medication management  Signed, Corey Skains M.D. [facility] 01/29/2017, 9:21 AM

## 2017-01-29 NOTE — Progress Notes (Signed)
HD initiated without issue via L AVF. No heparin tx.  

## 2017-01-29 NOTE — Progress Notes (Signed)
HD completed without issue. Total UF 1.7L. Unable to meet 2L goal d/t bp drop x2 during treatment. Patient remained asymptomatic throughout. Tolerated well.

## 2017-01-29 NOTE — Care Management Important Message (Signed)
Important Message  Patient Details  Name: Frank Huang MRN: [id number] Date of Birth: 02/21/1932   Medicare Important Message Given:  Yes  Initial signed IM printed from Epic and given to patient.    Katrina Stack, RN 01/29/2017, 5:15 PM

## 2017-01-29 NOTE — Progress Notes (Signed)
Pre HD assessment. Patient has no current complaints

## 2017-01-29 NOTE — Progress Notes (Signed)
Taken by Edmonia Caprio, RN

## 2017-01-29 NOTE — Progress Notes (Signed)
[facility]  ROUNDING NOTE   Subjective:   Hemodialysis yesterday. Plan for dialysis today.   Coughing, states he is breathing better.   Wife at bedside.   Objective:  Vital signs in last 24 hours:  Temp:  [97.8 F (36.6 C)-98.2 F (36.8 C)] (P) 97.9 F (36.6 C) (03/21 1702) Pulse Rate:  [78-89] (P) 89 (03/21 1702) Resp:  [15-20] (P) 15 (03/21 1702) BP: (98-118)/(45-95) (P) 101/53 (03/21 1702) SpO2:  [94 %-100 %] (P) 96 % (03/21 1702) Weight:  [88.2 kg (194 lb 7.1 oz)] (P) 88.2 kg (194 lb 7.1 oz) (03/21 1702)  Weight change: 2.817 kg (6 lb 3.4 oz) Filed Weights   01/28/17 0454 01/28/17 1558 01/29/17 1702  Weight: 86.2 kg (190 lb) 89 kg (196 lb 3.4 oz) (P) 88.2 kg (194 lb 7.1 oz)    Intake/Output: I/O last 3 completed shifts: In: 50 [IV Piggyback:50] Out: 1500 [Other:1500]   Intake/Output this shift:  No intake/output data recorded.  Physical Exam: General: NAD, laying bed  Head: Normocephalic, atraumatic. Moist oral mucosal membranes  Eyes: Anicteric, PERRL  Neck: Supple, trachea midline  Lungs:  Wheezing, crackles  Heart: Regular rate and rhythm  Abdomen:  Soft, nontender,   Extremities:  ++ peripheral edema.  Neurologic: Nonfocal, moving all four extremities  Skin: No lesions  Access: Left arm AVF    Basic Metabolic Panel:  Recent Labs Lab 01/28/17 0458 01/29/17 0416  NA 132* 137  K 4.7 4.4  CL 92* 95*  CO2 27 30  GLUCOSE 138* 131*  BUN 63* 39*  CREATININE 7.62* 5.09*  CALCIUM 8.9 9.0    Liver Function Tests:  Recent Labs Lab 01/28/17 0458  AST 29  ALT 12*  ALKPHOS 48  BILITOT 1.4*  PROT 7.5  ALBUMIN 3.7    Recent Labs Lab 01/28/17 0458  LIPASE 34   No results for input(s): AMMONIA in the last 168 hours.  CBC:  Recent Labs Lab 01/28/17 0527 01/29/17 0416  WBC 8.1 9.3  NEUTROABS 7.4*  --   HGB 8.9* 8.7*  HCT 26.0* 25.8*  MCV 98.8 99.0  PLT 92* 87*    Cardiac Enzymes: No results for input(s): CKTOTAL,  CKMB, CKMBINDEX, TROPONINI in the last 168 hours.  BNP: Invalid input(s): POCBNP  CBG: No results for input(s): GLUCAP in the last 168 hours.  Microbiology: Results for orders placed or performed during the hospital encounter of 01/28/17  Culture, blood (routine x 2)     Status: None (Preliminary result)   Collection Time: 01/28/17  5:56 AM  Result Value Ref Range Status   Specimen Description BLOOD R FA  Final   Special Requests BOTTLES DRAWN AEROBIC AND ANAEROBIC BCAV  Final   Culture NO GROWTH 1 DAY  Final   Report Status PENDING  Incomplete  Culture, blood (routine x 2)     Status: None (Preliminary result)   Collection Time: 01/28/17  5:57 AM  Result Value Ref Range Status   Specimen Description BLOOD R WRIST  Final   Special Requests BOTTLES DRAWN AEROBIC AND ANAEROBIC BCAV  Final   Culture NO GROWTH 1 DAY  Final   Report Status PENDING  Incomplete    Coagulation Studies: No results for input(s): LABPROT, INR in the last 72 hours.  Urinalysis: No results for input(s): COLORURINE, LABSPEC, PHURINE, GLUCOSEU, HGBUR, BILIRUBINUR, KETONESUR, PROTEINUR, UROBILINOGEN, NITRITE, LEUKOCYTESUR in the last 72 hours.  Invalid input(s): APPERANCEUR    Imaging: Dg Chest 2 View  Result Date:  01/28/2017 CLINICAL DATA:  83 year old male with shortness of breath. Patient fell in the bathroom. EXAM: CHEST  2 VIEW COMPARISON:  12/01/2016 FINDINGS: There is emphysematous changes of the lungs. Confluent area of density in the right lower lobe concerning for developing infiltrate. There is no pleural effusion or pneumothorax. Mild cardiomegaly. The aorta is mildly torturous. There is osteopenia with degenerative changes of the spine. No acute fracture. Left axillary vascular stent. IMPRESSION: Confluent area of airspace opacity at the right lung base concerning for developing pneumonia. Clinical correlation and follow-up recommended. Electronically Signed   By: Anner Crete M.D.   On:  01/28/2017 05:26     Medications:    . aspirin EC  81 mg Oral Daily  . [START ON 01/30/2017] azithromycin  500 mg Oral Daily  . cefTRIAXone (ROCEPHIN)  IV  1 g Intravenous Daily  . donepezil  10 mg Oral QHS  . [START ON 01/30/2017] furosemide  80 mg Oral Q T,Th,S,Su  . heparin  5,000 Units Subcutaneous Q8H  . ipratropium-albuterol  3 mL Nebulization Q6H  . methylPREDNISolone (SOLU-MEDROL) injection  40 mg Intravenous Daily  . metoprolol tartrate  12.5 mg Oral BID  . mometasone-formoterol  2 puff Inhalation BID  . pantoprazole  40 mg Oral Daily  . pravastatin  20 mg Oral q1800  . sevelamer carbonate  800 mg Oral TID  . sodium chloride flush  3 mL Intravenous Q12H   sodium chloride, sodium chloride flush  Assessment/ Plan:  [name]  with ESRD on hemodialysis, multiple myeloma, CAD, CVA, COPD , A fib admitted on 01/28/2017   [facility]/ [facility]/ MWF  1. ESRD: MWF. Dialysis yesterday. UF 1.5 litres resume MWF schedule. Dialysis for later today.   2. Hypertension: blood pressure at goal. With a-fib - metoprolol  3. Anemia of chronic kidney disease: hemoglobin 8.7 - [facility] as outpatient.  - EPO with HD today.   4. Secondary Hyperparathyroidism:  - sevelamer for binding   LOS: [place], [place] 3/21/20185:24 PM

## 2017-01-29 NOTE — Evaluation (Signed)
Physical Therapy Evaluation Patient Details Name: Frank Huang MRN: [id number] DOB: 02/27/1932 Today's Date: 01/29/2017   History of Present Illness  presented to ER with progressive SOB; admitted with acute CHF and CAP (R infiltrate), noted fluid overload due to missed dialysis session.  Clinical Impression  Upon evaluation, patient alert and oriented; follows all commands and demonstrates good safety awareness/insight.  Bilat UE/LE grossly WFL and symmetrical, functional for basic transfers and mobility.  Able to complete bed mobility indep; sit/stand, basic transfers and gait (220') with RW, cga/min assist.  Decreased functional strenght/power in bilat LEs, evidenced by multiple attempts required to complete sit/stand from bed surface.  Mobilizes with slow, but steady, cadence; maintains sats >92% on RA at rest and with activity.  BORG 5/10 with activity; may benefit from education regarding activity pacing and energy conservation to maximize safety with activities. Would benefit from skilled PT to address above deficits and promote optimal return to PLOF; Recommend transition to [place] upon discharge from acute hospitalization.     Follow Up Recommendations Home health PT    Equipment Recommendations  Rolling walker with 5" wheels    Recommendations for Other Services       Precautions / Restrictions Precautions Precautions: Fall Precaution Comments: No BP L UE Restrictions Weight Bearing Restrictions: No      Mobility  Bed Mobility Overal bed mobility: Modified Independent                Transfers Overall transfer level: Needs assistance Equipment used: Rolling walker (2 wheeled) Transfers: Sit to/from Stand Sit to Stand: Min guard         General transfer comment: multiple attempts and use of bilat UEs required to complete, suggestive of decreased functional power bilat LEs  Ambulation/Gait Ambulation/Gait assistance: Min guard Ambulation Distance (Feet): 220  Feet Assistive device: Rolling walker (2 wheeled)       General Gait Details: reciprocal stepping pattern with fair step height/length; min cuing for postural extension and upwards gaze.  Slow but steady without overt buckling or LOB.  Maintains sats >92% on RA; BORG 5/10 after distance.  Stairs            Wheelchair Mobility    Modified Rankin (Stroke Patients Only)       Balance Overall balance assessment: Needs assistance Sitting-balance support: No upper extremity supported;Feet supported Sitting balance-Leahy Scale: Normal     Standing balance support: Bilateral upper extremity supported Standing balance-Leahy Scale: Fair                               Pertinent Vitals/Pain Pain Assessment: No/denies pain    Home Living Family/patient expects to be discharged to:: Private residence Living Arrangements: Spouse/significant other Available Help at Discharge: Family Type of Home: House Home Access: Stairs to enter Entrance Stairs-Rails: Right;Left;Can reach both Technical brewer of Steps: 5 Home Layout: One level Home Equipment: Cane - single point      Prior Function Level of Independence: Independent         Comments: Intermittent use of SPC "when I feel like I need something", but generally ambulatory and indep with ADLS without assist device.  Denies fall history outside of this episode     Hand Dominance        Extremity/Trunk Assessment   Upper Extremity Assessment Upper Extremity Assessment: Overall WFL for tasks assessed    Lower Extremity Assessment Lower Extremity Assessment: Overall WFL for tasks  assessed (grossly at least 4+/5 throughout)       Communication   Communication: No difficulties  Cognition Arousal/Alertness: Awake/alert Behavior During Therapy: WFL for tasks assessed/performed Overall Cognitive Status: Within Functional Limits for tasks assessed                      General Comments       Exercises Other Exercises Other Exercises: Standing LE therex, 1x10, with RW, cga/min assist-fatigues quickly and requires seated rest periods after 1-2 sets   Assessment/Plan    PT Assessment Patient needs continued PT services  PT Problem List Decreased activity tolerance;Decreased balance;Decreased mobility;Cardiopulmonary status limiting activity       PT Treatment Interventions DME instruction;Gait training;Functional mobility training;Stair training;Therapeutic activities;Therapeutic exercise;Balance training;Patient/family education    PT Goals (Current goals can be found in the Care Plan section)  Acute Rehab PT Goals Patient Stated Goal: to return home PT Goal Formulation: With patient/family Time For Goal Achievement: 02/12/17 Potential to Achieve Goals: Good    Frequency Min 2X/week   Barriers to discharge        Co-evaluation               End of Session Equipment Utilized During Treatment: Gait belt Activity Tolerance: Patient tolerated treatment well Patient left: in bed;with call bell/phone within reach;with bed alarm set;with family/visitor present Nurse Communication:  (O2 response to activity)           Time: 7867-6720 PT Time Calculation (min) (ACUTE ONLY): 21 min   Charges:   PT Evaluation $PT Eval Low Complexity: 1 Procedure PT Treatments $Therapeutic Exercise: 8-22 mins   PT G Codes:         Aritha Huckeba H. Owens Shark, PT, DPT, NCS 01/29/17, 10:15 PM [phone]

## 2017-01-29 NOTE — Progress Notes (Signed)
Patient ID: Frank Huang, male   DOB: 1932/11/01, 83 y.o.   MRN: [id number]   [facility] PROGRESS NOTE  Frank Huang RSW:[id number] DOB: 01/09/1932 DOA: 01/28/2017 PCP: Wilhemena Durie, MD  HPI/Subjective: Patient feels that he is breathing better. Still with cough and shortness of breath and some wheeze.  Objective: Vitals:   01/29/17 0753 01/29/17 1218  BP: (!) 99/45 (!) 102/56  Pulse: 78 82  Resp:  16  Temp:  97.8 F (36.6 C)    Filed Weights   01/28/17 0454 01/28/17 1558  Weight: 86.2 kg (190 lb) 89 kg (196 lb 3.4 oz)    ROS: Review of Systems  Constitutional: Negative for chills and fever.  Eyes: Negative for blurred vision.  Respiratory: Positive for cough, shortness of breath and wheezing.   Cardiovascular: Negative for chest pain.  Gastrointestinal: Negative for abdominal pain, constipation, diarrhea, nausea and vomiting.  Genitourinary: Negative for dysuria.  Musculoskeletal: Negative for joint pain.  Neurological: Negative for dizziness and headaches.   Exam: Physical Exam  Constitutional: He is oriented to person, place, and time.  HENT:  Nose: No mucosal edema.  Mouth/Throat: No oropharyngeal exudate or posterior oropharyngeal edema.  Eyes: Conjunctivae, EOM and lids are normal. Pupils are equal, round, and reactive to light.  Neck: No JVD present. Carotid bruit is not present. No edema present. No thyroid mass and no thyromegaly present.  Cardiovascular: S1 normal and S2 normal.  Exam reveals no gallop.   Murmur heard.  Systolic murmur is present with a grade of 2/6  Pulses:      Dorsalis pedis pulses are 2+ on the right side, and 2+ on the left side.  Respiratory: No respiratory distress. He has decreased breath sounds in the right middle field, the right lower field, the left middle field and the left lower field. He has wheezes in the right middle field and the left middle field. He has no rhonchi. He has rales in the right lower field and the left  lower field.  GI: Soft. Bowel sounds are normal. There is no tenderness.  Musculoskeletal:       Right ankle: He exhibits swelling.       Left ankle: He exhibits swelling.  Lymphadenopathy:    He has no cervical adenopathy.  Neurological: He is alert and oriented to person, place, and time. No cranial nerve deficit.  Skin: Skin is warm. No rash noted. Nails show no clubbing.  Back of right shoulder is scraped from fall.  Psychiatric: He has a normal mood and affect.      Data Reviewed: Basic Metabolic Panel:  Recent Labs Lab 01/28/17 0458 01/29/17 0416  NA 132* 137  K 4.7 4.4  CL 92* 95*  CO2 27 30  GLUCOSE 138* 131*  BUN 63* 39*  CREATININE 7.62* 5.09*  CALCIUM 8.9 9.0   Liver Function Tests:  Recent Labs Lab 01/28/17 0458  AST 29  ALT 12*  ALKPHOS 48  BILITOT 1.4*  PROT 7.5  ALBUMIN 3.7    Recent Labs Lab 01/28/17 0458  LIPASE 34   CBC:  Recent Labs Lab 01/28/17 0527 01/29/17 0416  WBC 8.1 9.3  NEUTROABS 7.4*  --   HGB 8.9* 8.7*  HCT 26.0* 25.8*  MCV 98.8 99.0  PLT 92* 87*   BNP (last 3 results)  Recent Labs  04/11/16 1537  BNP 763.7*      Studies: Dg Chest 2 View  Result Date: 01/28/2017 CLINICAL DATA:  83 year old male  with shortness of breath. Patient fell in the bathroom. EXAM: CHEST  2 VIEW COMPARISON:  12/01/2016 FINDINGS: There is emphysematous changes of the lungs. Confluent area of density in the right lower lobe concerning for developing infiltrate. There is no pleural effusion or pneumothorax. Mild cardiomegaly. The aorta is mildly torturous. There is osteopenia with degenerative changes of the spine. No acute fracture. Left axillary vascular stent. IMPRESSION: Confluent area of airspace opacity at the right lung base concerning for developing pneumonia. Clinical correlation and follow-up recommended. Electronically Signed   By: Anner Crete M.D.   On: 01/28/2017 05:26    Scheduled Meds: . aspirin EC  81 mg Oral Daily  .  [START ON 01/30/2017] azithromycin  500 mg Oral Daily  . cefTRIAXone (ROCEPHIN)  IV  1 g Intravenous Daily  . donepezil  10 mg Oral QHS  . [START ON 01/30/2017] furosemide  80 mg Oral Q T,Th,S,Su  . heparin  5,000 Units Subcutaneous Q8H  . ipratropium-albuterol  3 mL Nebulization Q6H  . methylPREDNISolone (SOLU-MEDROL) injection  40 mg Intravenous Daily  . metoprolol tartrate  12.5 mg Oral BID  . mometasone-formoterol  2 puff Inhalation BID  . pantoprazole  40 mg Oral Daily  . pravastatin  20 mg Oral q1800  . sevelamer carbonate  800 mg Oral TID  . sodium chloride flush  3 mL Intravenous Q12H    Assessment/Plan:  1. Acute systolic congestive heart failure with moderate to severe mitral regurgitation. Likely from missed dialysis session. Dialysis Again today. On metoprolol. Case discussed with Nephrology about Lasix on nondialysis days and they think this is a good idea. Echo not read yet 2. COPD exacerbation. Continue nebulizer treatments. Added daily Solu-Medrol and dulera inhaler 3. Pneumonia. On Rocephin and Zithromax 4. Essential hypertension on metoprolol 5. Atrial fibrillation. This looks new for him. [place] cardiology consultation. Aspirin only for anticoagulation. Rate controlled on metoprolol. 6. Hyperlipidemia unspecified on pravastatin 7. Weakness and fall we'll get physical therapy evaluation 8. End-stage renal disease on dialysis today  Code Status:     Code Status Orders        Start     Ordered   01/28/17 0758  Full code  Continuous     01/28/17 0757    Code Status History    Date Active Date Inactive Code Status Order ID Comments User Context   11/15/2016  2:30 AM 11/16/2016  5:27 PM Full Code [id number]  Harvie Bridge, DO Inpatient   06/18/2016 11:09 AM 06/19/2016  5:15 AM Full Code [id number]  Sabino Dick, MD HOV   09/19/2015  2:01 PM 09/19/2015  7:18 PM Full Code [id number]  Corky Mull, MD Inpatient   07/01/2015  7:54 AM 07/01/2015  3:59 PM Full Code [id number]   Loletha Grayer, MD ED     Family Communication: Wife at the bedside Disposition Plan: To be determined  Consultants:  Nephrology  cardiology  Antibiotics:  Rocephin   Zithromax   Time spent: 25 minutes  Loletha Grayer  [employer]

## 2017-01-30 ENCOUNTER — Ambulatory Visit: Payer: Medicare Other | Admitting: Family Medicine

## 2017-01-30 ENCOUNTER — Telehealth: Payer: Self-pay | Admitting: Family Medicine

## 2017-01-30 LAB — BASIC METABOLIC PANEL
Anion gap: 10 (ref 5–15)
BUN: 37 mg/dL — ABNORMAL HIGH (ref 6–20)
CO2: 31 mmol/L (ref 22–32)
Calcium: 9 mg/dL (ref 8.9–10.3)
Chloride: 97 mmol/L — ABNORMAL LOW (ref 101–111)
Creatinine, Ser: 4.8 mg/dL — ABNORMAL HIGH (ref 0.61–1.24)
GFR calc Af Amer: 12 mL/min — ABNORMAL LOW (ref >60)
GFR, EST NON AFRICAN AMERICAN: 10 mL/min — AB (ref >60)
Glucose, Bld: 99 mg/dL (ref 65–99)
POTASSIUM: 3.6 mmol/L (ref 3.5–5.1)
SODIUM: 138 mmol/L (ref 135–145)

## 2017-01-30 LAB — MAGNESIUM: Magnesium: 2.2 mg/dL (ref 1.7–2.4)

## 2017-01-30 LAB — MRSA PCR SCREENING: MRSA by PCR: NEGATIVE

## 2017-01-30 MED ORDER — AZITHROMYCIN 250 MG PO TABS: ORAL_TABLET | ORAL | 0 refills | Status: DC

## 2017-01-30 MED ORDER — FUROSEMIDE 80 MG PO TABS: 80.0000 mg | ORAL_TABLET | ORAL | 0 refills | Status: DC

## 2017-01-30 MED ORDER — PREDNISONE 10 MG PO TABS: 10.0000 mg | ORAL_TABLET | Freq: Every day | ORAL | 0 refills | Status: DC

## 2017-01-30 NOTE — Progress Notes (Signed)
[facility]  ROUNDING NOTE   Subjective:   Hemodialysis for last two days. UF total of 3.2 litres   Wife at bedside.   Objective:  Vital signs in last 24 hours:  Temp:  [97.9 F (36.6 C)-98.4 F (36.9 C)] 98.4 F (36.9 C) (03/22 0618) Pulse Rate:  [85-100] 85 (03/22 0618) Resp:  [15-18] 18 (03/22 0618) BP: (82-111)/(37-59) 99/51 (03/22 0618) SpO2:  [92 %-97 %] 97 % (03/22 0618) Weight:  [86.1 kg (189 lb 13.1 oz)-88.2 kg (194 lb 7.1 oz)] 86.1 kg (189 lb 13.1 oz) (03/21 2017)  Weight change: -0.8 kg (-1 lb 12.2 oz) Filed Weights   01/28/17 1558 01/29/17 1702 01/29/17 2017  Weight: 89 kg (196 lb 3.4 oz) 88.2 kg (194 lb 7.1 oz) 86.1 kg (189 lb 13.1 oz)    Intake/Output: I/O last 3 completed shifts: In: 240 [P.O.:240] Out: 3302 [Urine:100; Other:3202]   Intake/Output this shift:  Total I/O In: 120 [P.O.:120] Out: -   Physical Exam: General: NAD, laying bed  Head: Normocephalic, atraumatic. Moist oral mucosal membranes  Eyes: Anicteric, PERRL  Neck: Supple, trachea midline  Lungs:  Wheezing bilaterally  Heart: Regular rate and rhythm  Abdomen:  Soft, nontender,   Extremities: trace peripheral edema.  Neurologic: Nonfocal, moving all four extremities  Skin: No lesions  Access: Left arm AVF    Basic Metabolic Panel:  Recent Labs Lab 01/28/17 0458 01/29/17 0416 01/30/17 0743  NA 132* 137 138  K 4.7 4.4 3.6  CL 92* 95* 97*  CO2 27 30 31   GLUCOSE 138* 131* 99  BUN 63* 39* 37*  CREATININE 7.62* 5.09* 4.80*  CALCIUM 8.9 9.0 9.0  MG  --   --  2.2    Liver Function Tests:  Recent Labs Lab 01/28/17 0458  AST 29  ALT 12*  ALKPHOS 48  BILITOT 1.4*  PROT 7.5  ALBUMIN 3.7    Recent Labs Lab 01/28/17 0458  LIPASE 34   No results for input(s): AMMONIA in the last 168 hours.  CBC:  Recent Labs Lab 01/28/17 0527 01/29/17 0416  WBC 8.1 9.3  NEUTROABS 7.4*  --   HGB 8.9* 8.7*  HCT 26.0* 25.8*  MCV 98.8 99.0  PLT 92* 87*     Cardiac Enzymes: No results for input(s): CKTOTAL, CKMB, CKMBINDEX, TROPONINI in the last 168 hours.  BNP: Invalid input(s): POCBNP  CBG: No results for input(s): GLUCAP in the last 168 hours.  Microbiology: Results for orders placed or performed during the hospital encounter of 01/28/17  Culture, blood (routine x 2)     Status: None (Preliminary result)   Collection Time: 01/28/17  5:56 AM  Result Value Ref Range Status   Specimen Description BLOOD R FA  Final   Special Requests BOTTLES DRAWN AEROBIC AND ANAEROBIC BCAV  Final   Culture NO GROWTH 2 DAYS  Final   Report Status PENDING  Incomplete  Culture, blood (routine x 2)     Status: None (Preliminary result)   Collection Time: 01/28/17  5:57 AM  Result Value Ref Range Status   Specimen Description BLOOD R WRIST  Final   Special Requests BOTTLES DRAWN AEROBIC AND ANAEROBIC BCAV  Final   Culture NO GROWTH 2 DAYS  Final   Report Status PENDING  Incomplete  MRSA PCR Screening     Status: None   Collection Time: 01/30/17  5:13 AM  Result Value Ref Range Status   MRSA by PCR NEGATIVE NEGATIVE Final  Comment:        The GeneXpert MRSA Assay (FDA approved for NASAL specimens only), is one component of a comprehensive MRSA colonization surveillance program. It is not intended to diagnose MRSA infection nor to guide or monitor treatment for MRSA infections.     Coagulation Studies: No results for input(s): LABPROT, INR in the last 72 hours.  Urinalysis: No results for input(s): COLORURINE, LABSPEC, PHURINE, GLUCOSEU, HGBUR, BILIRUBINUR, KETONESUR, PROTEINUR, UROBILINOGEN, NITRITE, LEUKOCYTESUR in the last 72 hours.  Invalid input(s): APPERANCEUR    Imaging: No results found.   Medications:    . aspirin EC  81 mg Oral Daily  . azithromycin  500 mg Oral Daily  . cefTRIAXone (ROCEPHIN)  IV  1 g Intravenous Daily  . donepezil  10 mg Oral QHS  . furosemide  80 mg Oral Q T,Th,S,Su  . heparin  5,000 Units  Subcutaneous Q8H  . ipratropium-albuterol  3 mL Nebulization TID  . methylPREDNISolone (SOLU-MEDROL) injection  40 mg Intravenous Daily  . metoprolol tartrate  12.5 mg Oral BID  . mometasone-formoterol  2 puff Inhalation BID  . pantoprazole  40 mg Oral Daily  . pravastatin  20 mg Oral q1800  . sevelamer carbonate  800 mg Oral TID  . sodium chloride flush  3 mL Intravenous Q12H   sodium chloride, diphenhydrAMINE, ipratropium-albuterol, sodium chloride flush  Assessment/ Plan:  [name]  with ESRD on hemodialysis, multiple myeloma, CAD, CVA, COPD , A fib admitted on 01/28/2017   [facility]/ [facility]/ MWF  1. ESRD: MWF. Dialysis yesterday. UF yesterday of 1.7 and day before of 1.5 litres Dialysis for tomorrow.   2. Hypertension: blood pressure at goal. With a-fib - metoprolol  3. Anemia of chronic kidney disease: hemoglobin 8.7 - Mircera as outpatient.  - EPO with HD while inpatient.   4. Secondary Hyperparathyroidism:  - sevelamer for binding  5. Acute exacerbation of COPD: continue steroids and inhalers.    LOS: 2 Lynden Carrithers 3/22/201812:46 PM

## 2017-01-30 NOTE — Care Management (Signed)
Patient and his wife very firmly decline home health SN and PT.  Wife says that patient is followed closely by his doctors and has a close friend that is a [employer] scientist.  Patient is not requiring oxygen. Has access to a walker at home

## 2017-01-30 NOTE — Progress Notes (Signed)
Physical Therapy Treatment Patient Details Name: Frank Huang MRN: [id number] DOB: March 01, 1932 Today's Date: 01/30/2017    History of Present Illness presented to ER with progressive SOB; admitted with acute CHF and CAP (R infiltrate), noted fluid overload due to missed dialysis session.    PT Comments    Pt able to ambulate around unit x 1 with rw and min guard.  Pt with slow but steady gait.  He does c/o LE weakness limiting his distances.  Upon return to room he took a short seated rest break.  He was able to complete minimal standing exercises at bedside due to fatigue.  He chose to return to bed due to fatigue.   Follow Up Recommendations  Home health PT     Equipment Recommendations  Rolling walker with 5" wheels    Recommendations for Other Services       Precautions / Restrictions Precautions Precautions: Fall Precaution Comments: No BP L UE Restrictions Weight Bearing Restrictions: No    Mobility  Bed Mobility Overal bed mobility: Modified Independent                Transfers Overall transfer level: Needs assistance Equipment used: Rolling walker (2 wheeled) Transfers: Sit to/from Stand Sit to Stand: Min guard         General transfer comment: pt able to stand on first attempts today  Ambulation/Gait Ambulation/Gait assistance: Min guard Ambulation Distance (Feet): 220 Feet Assistive device: Rolling walker (2 wheeled) Gait Pattern/deviations: Step-through pattern   Gait velocity interpretation: Below normal speed for age/gender General Gait Details: gait slow but steady.  No buckling or LOB, c/o le fatigue with gait.   Stairs            Wheelchair Mobility    Modified Rankin (Stroke Patients Only)       Balance Overall balance assessment: Needs assistance Sitting-balance support: No upper extremity supported;Feet supported Sitting balance-Leahy Scale: Normal     Standing balance support: Bilateral upper extremity  supported Standing balance-Leahy Scale: Fair                      Cognition Arousal/Alertness: Awake/alert Behavior During Therapy: WFL for tasks assessed/performed Overall Cognitive Status: Within Functional Limits for tasks assessed                      Exercises Other Exercises Other Exercises: standing exercises for heel raises and marches.  self limited due to fatigue    General Comments        Pertinent Vitals/Pain Pain Assessment: No/denies pain    Home Living                      Prior Function            PT Goals (current goals can now be found in the care plan section) Progress towards PT goals: Progressing toward goals    Frequency    Min 2X/week      PT Plan Current plan remains appropriate    Co-evaluation             End of Session Equipment Utilized During Treatment: Gait belt Activity Tolerance: Patient tolerated treatment well Patient left: in bed;with call bell/phone within reach;with bed alarm set         Time: 4098-1191 PT Time Calculation (min) (ACUTE ONLY): 15 min  Charges:  $Gait Training: 8-22 mins  G Codes:       Chesley Noon, PTA 01/30/17, 10:04 AM

## 2017-01-30 NOTE — Progress Notes (Signed)
[facility] Encounter Note  Patient: Frank Huang / Admit Date: 01/28/2017 / Date of Encounter: 01/30/2017, 8:10 AM   Subjective: Patient breathing much better but still has some cough and congestion. No evidence of congestive heart failure at this time. No chest pain or new concerns of the anginal symptoms. Cough congestion  Review of Systems: Positive for: Cough and congestion Negative for: Vision change, hearing change, syncope, dizziness, nausea, vomiting,diarrhea, bloody stool, stomach pain, positive for cough, congestion, negative for diaphoresis, urinary frequency, urinary pain,skin lesions, skin rashes Others previously listed  Objective: Telemetry: Atrial fibrillation Physical Exam: Blood pressure (!) 99/51, pulse 85, temperature 98.4 F (36.9 C), temperature source Oral, resp. rate 18, height 5\' 10"  (1.778 m), weight 86.1 kg (189 lb 13.1 oz), SpO2 97 %. Body mass index is 27.24 kg/m. General: Well developed, well nourished, in no acute distress. Head: Normocephalic, atraumatic, sclera non-icteric, no xanthomas, nares are without discharge. Neck: No apparent masses Lungs: Normal respirations with no wheezes, few use rhonchi, no rales , basilar crackles   Heart: Irregular rate and rhythm, normal S1 S2, no murmur, no rub, no gallop, PMI is normal size and placement, carotid upstroke normal without bruit, jugular venous pressure normal Abdomen: Soft, non-tender, non-distended with normoactive bowel sounds. No hepatosplenomegaly. Abdominal aorta is normal size without bruit Extremities: Trace edema, no clubbing, no cyanosis, no ulcers,  Peripheral: 2+ radial, 2+ femoral, 2+ dorsal pedal pulses Neuro: Alert and oriented. Moves all extremities spontaneously. Psych:  Responds to questions appropriately with a normal affect.   Intake/Output Summary (Last 24 hours) at 01/30/17 0810 Last data filed at 01/30/17 1700  Gross per 24 hour  Intake              240 ml   Output             1802 ml  Net            -1562 ml    Inpatient Medications:  . aspirin EC  81 mg Oral Daily  . azithromycin  500 mg Oral Daily  . cefTRIAXone (ROCEPHIN)  IV  1 g Intravenous Daily  . donepezil  10 mg Oral QHS  . furosemide  80 mg Oral Q T,Th,S,Su  . heparin  5,000 Units Subcutaneous Q8H  . ipratropium-albuterol  3 mL Nebulization TID  . methylPREDNISolone (SOLU-MEDROL) injection  40 mg Intravenous Daily  . metoprolol tartrate  12.5 mg Oral BID  . mometasone-formoterol  2 puff Inhalation BID  . pantoprazole  40 mg Oral Daily  . pravastatin  20 mg Oral q1800  . sevelamer carbonate  800 mg Oral TID  . sodium chloride flush  3 mL Intravenous Q12H   Infusions:   Labs:  Recent Labs  01/29/17 0416 01/30/17 0743  NA 137 138  K 4.4 3.6  CL 95* 97*  CO2 30 31  GLUCOSE 131* 99  BUN 39* 37*  CREATININE 5.09* 4.80*  CALCIUM 9.0 9.0  MG  --  2.2    Recent Labs  01/28/17 0458  AST 29  ALT 12*  ALKPHOS 48  BILITOT 1.4*  PROT 7.5  ALBUMIN 3.7    Recent Labs  01/28/17 0527 01/29/17 0416  WBC 8.1 9.3  NEUTROABS 7.4*  --   HGB 8.9* 8.7*  HCT 26.0* 25.8*  MCV 98.8 99.0  PLT 92* 87*   No results for input(s): CKTOTAL, CKMB, TROPONINI in the last 72 hours. Invalid input(s): POCBNP No results for input(s): HGBA1C in  the last 72 hours.   Weights: Filed Weights   01/28/17 1558 01/29/17 1702 01/29/17 2017  Weight: 89 kg (196 lb 3.4 oz) 88.2 kg (194 lb 7.1 oz) 86.1 kg (189 lb 13.1 oz)     Radiology/Studies:  Dg Chest 2 View  Result Date: 01/28/2017 CLINICAL DATA:  83 year old male with shortness of breath. Patient fell in the bathroom. EXAM: CHEST  2 VIEW COMPARISON:  12/01/2016 FINDINGS: There is emphysematous changes of the lungs. Confluent area of density in the right lower lobe concerning for developing infiltrate. There is no pleural effusion or pneumothorax. Mild cardiomegaly. The aorta is mildly torturous. There is osteopenia with degenerative  changes of the spine. No acute fracture. Left axillary vascular stent. IMPRESSION: Confluent area of airspace opacity at the right lung base concerning for developing pneumonia. Clinical correlation and follow-up recommended. Electronically Signed   By: Anner Crete M.D.   On: 01/28/2017 05:26     Assessment and Recommendation  83 y.o. male with known chronic nonvalvular atrial fibrillation and anemia chronic kidney disease stage V with previous myocardial infarction and acute on chronic systolic dysfunction congestive heart failure secondary to pneumonia slightly improved at this time without evidence of myocardial infarction 1. Continue gentle diuresis with furosemide for pulmonary edema likely secondary to hypoxia and pneumonia 2. No further intervention from the cardiac standpoint without evidence of myocardial infarction 3. High intensity cholesterol therapy with pravastatin without change 4. Continue supportive care and antibiotics and inhalers for pneumonia 5. Heparin for further risk reduction of stroke with atrial fibrillation and further consideration of long-term outpatient anticoagulation with warfarin if patient is amenable. Currently the patient wishes not to pursue due to significant fall risk and bleeding concerns 6. Begin ambulation and follow for further improvements of symptoms and need for adjustments of medications although currently stable  Signed, Serafina Royals M.D. FACC

## 2017-01-30 NOTE — Discharge Instructions (Signed)
Shortness of Breath, Adult °Shortness of breath is when a person has trouble breathing enough air, or when a person feels like she or he is having trouble breathing in enough air. Shortness of breath could be a sign of medical problem. °Follow these instructions at home: °Pay attention to any changes in your symptoms. Take these actions to help with your condition: °· Do not smoke. Smoking is a common cause of shortness of breath. If you smoke and you need help quitting, ask your health care provider. °· Avoid things that can irritate your airways, such as: °¨ Mold. °¨ Dust. °¨ Air pollution. °¨ Chemical fumes. °¨ Things that can cause allergy symptoms (allergens), if you have allergies. °· Keep your living space clean and free of mold and dust. °· Rest as needed. Slowly return to your usual activities. °· Take over-the-counter and prescription medicines, including oxygen and inhaled medicines, only as told by your health care provider. °· Keep all follow-up visits as told by your health care provider. This is important. °Contact a health care provider if: °· Your condition does not improve as soon as expected. °· You have a hard time doing your normal activities, even after you rest. °· You have new symptoms. °Get help right away if: °· Your shortness of breath gets worse. °· You have shortness of breath when you are resting. °· You feel light-headed or you faint. °· You have a cough that is not controlled with medicines. °· You cough up blood. °· You have pain with breathing. °· You have pain in your chest, arms, shoulders, or abdomen. °· You have a fever. °· You cannot walk up stairs or exercise the way that you normally do. °This information is not intended to replace advice given to you by your health care provider. Make sure you discuss any questions you have with your health care provider. °Document Released: 07/23/2001 Document Revised: 05/18/2016 Document Reviewed: 04/04/2016 °Elsevier Interactive Patient  Education © 2017 [employer] ° °

## 2017-01-30 NOTE — Discharge Summary (Signed)
[facility] NAME: Frank Huang    MR#:  [id number]  DATE OF BIRTH:  1932/05/05  DATE OF ADMISSION:  01/28/2017 ADMITTING PHYSICIAN: Saundra Shelling, MD  DATE OF DISCHARGE: 01/30/2017  PRIMARY CARE PHYSICIAN: Wilhemena Durie, MD    ADMISSION DIAGNOSIS:  COPD exacerbation ([place]) [J44.1] Fall, initial encounter [W19.XXXA] Stage 5 chronic kidney disease on chronic dialysis (HCC) [N18.6, Z99.2] Anemia in chronic kidney disease, on chronic dialysis ([place]) [N18.6, D63.1, Z99.2] Community acquired pneumonia of right lower lobe of lung ([place]) [J18.1]  DISCHARGE DIAGNOSIS:  Active Problems:   Pneumonia  CHF exacerbation COPD exacerbation SECONDARY DIAGNOSIS:   Past Medical History:  Diagnosis Date  . Cancer ([place])   . Chronic kidney disease   . COPD (chronic obstructive pulmonary disease) ([place])   . Coronary artery disease   . GERD (gastroesophageal reflux disease)   . Multiple myeloma ([place])   . Myocardial infarction 2005  . Neuropathy ([place])   . Shortness of breath dyspnea   . Stroke (cerebrum) (HCC)    weakness Lt hand  . Systolic CHF [facility])     HOSPITAL COURSE:   83 year old male with known chronic nonvalvular atrial fibrillation, ESRD and hemodialysis and chronic systolic heart failure ejection fraction 25-30% who presented with shortness of breath.  1. Acute on chronic systolic heart failure with ejection fraction of 25-30%: Patient was treated with IV Lasix and dialysis and is now euvolemic. 2. Acute COPD exacerbation: Patient still has some mild wheezing on examination however he is not hypoxic. He ambulated with physical therapy and is not complaining shortness of breath. He will be discharged with steroid taper.  3. Community-acquired pneumonia: Patient is treated with IV Rocephin and Zithromax. He will be discharged on Zithromax.  4. Essential hypertension: Patient will continue outpatient medications  5. Atrial  fibrillation nonvalvular: Patient is followed by cardiology. It is recommended aspirin for anticoagulation. He is rate controlled with metoprolol  5. Hyperlipidemia: Continue statin  6. ESRD on hemodialysis: Patient did have dialysis while in the hospital and will resume his outpatient HD schedule.  DISCHARGE CONDITIONS AND DIET:  Stable Renal diet  CONSULTS OBTAINED:  Treatment Team:  Lavonia Dana, MD Corey Skains, MD  DRUG ALLERGIES:  No Known Allergies  DISCHARGE MEDICATIONS:   Current Discharge Medication List    START taking these medications   Details  azithromycin (ZITHROMAX) 250 MG tablet Take 1 tablet daily for 3 days then stop Qty: 3 each, Refills: 0    furosemide (LASIX) 80 MG tablet Take 1 tablet (80 mg total) by mouth every Tuesday, Thursday, Saturday, and Sunday. Qty: 30 tablet, Refills: 0    predniSONE (DELTASONE) 10 MG tablet Take 1 tablet (10 mg total) by mouth daily with breakfast. 50 mg PO (ORAL)  x 2 days 40 mg PO (ORAL)  x 2 days 30 mg PO  (ORAL)  x 2 days 20 mg PO  (ORAL) x 2 days 10 mg PO  (ORAL) x 2 days then stop Qty: 32 tablet, Refills: 0      CONTINUE these medications which have NOT CHANGED   Details  aspirin EC 81 MG tablet Take 81 mg by mouth daily.    CYANOCOBALAMIN IJ Inject 1 Dose as directed every 30 (thirty) days.    donepezil (ARICEPT) 10 MG tablet Take 1 tablet (10 mg total) by mouth at bedtime. Qty: 90 tablet, Refills: 3   Associated Diagnoses: Bad memory; Memory change  lidocaine-prilocaine (EMLA) cream     lovastatin (MEVACOR) 40 MG tablet TAKE 1 TABLET BY MOUTH EVERY DAY FOR HIGH CHOLESTEROL    metoprolol tartrate (LOPRESSOR) 25 MG tablet Take 0.5 tablets (12.5 mg total) by mouth 2 (two) times daily. Qty: 60 tablet, Refills: 0    Multiple Vitamin (MULTI-VITAMINS) TABS Take by mouth. Reported on 04/01/2016    omeprazole (PRILOSEC) 20 MG capsule TAKE 1 CAPSULE BY MOUTH  DAILY. Qty: 90 capsule, Refills: 3     RENVELA 800 MG tablet Take 800 mg by mouth 3 (three) times daily.    sucralfate (CARAFATE) 1 g tablet Take 1 tablet (1 g total) by mouth 4 (four) times daily -  before meals and at bedtime. Qty: 120 tablet, Refills: 11   Associated Diagnoses: Gastroesophageal reflux disease, esophagitis presence not specified          Today   CHIEF COMPLAINT:  Doing well ready for discharge   VITAL SIGNS:  Blood pressure (!) 106/57, pulse 84, temperature 98.4 F (36.9 C), temperature source Oral, resp. rate 16, height 5' 10" (1.778 m), weight 86.1 kg (189 lb 13.1 oz), SpO2 97 %.   REVIEW OF SYSTEMS:  Review of Systems  Constitutional: Negative.  Negative for chills, fever and malaise/fatigue.  HENT: Negative.  Negative for ear discharge, ear pain, hearing loss, nosebleeds and sore throat.   Eyes: Negative.  Negative for blurred vision and pain.  Respiratory: Negative.  Negative for cough, hemoptysis, shortness of breath and wheezing.   Cardiovascular: Negative.  Negative for chest pain, palpitations and leg swelling.  Gastrointestinal: Negative.  Negative for abdominal pain, blood in stool, diarrhea, nausea and vomiting.  Genitourinary: Negative.  Negative for dysuria.  Musculoskeletal: Negative.  Negative for back pain.  Skin: Negative.   Neurological: Negative for dizziness, tremors, speech change, focal weakness, seizures and headaches.  Endo/Heme/Allergies: Negative.  Does not bruise/bleed easily.  Psychiatric/Behavioral: Negative.  Negative for depression, hallucinations and suicidal ideas.     PHYSICAL EXAMINATION:  GENERAL:  83 y.o.-year-old patient lying in the bed with no acute distress.  NECK:  Supple, no jugular venous distention. No thyroid enlargement, no tenderness.  LUNGS: mild  wheezing,good air flow NO rales,rhonchi  No use of accessory muscles of respiration.  CARDIOVASCULAR: S1, S2 normal. No murmurs, rubs, or gallops.  ABDOMEN: Soft, non-tender, non-distended. Bowel  sounds present. No organomegaly or mass.  EXTREMITIES: No pedal edema, cyanosis, or clubbing.  PSYCHIATRIC: The patient is alert and oriented x 3.  SKIN: No obvious rash, lesion, or ulcer.   DATA REVIEW:   CBC  Recent Labs Lab 01/29/17 0416  WBC 9.3  HGB 8.7*  HCT 25.8*  PLT 87*    Chemistries   Recent Labs Lab 01/28/17 0458  01/30/17 0743  NA 132*  < > 138  K 4.7  < > 3.6  CL 92*  < > 97*  CO2 27  < > 31  GLUCOSE 138*  < > 99  BUN 63*  < > 37*  CREATININE 7.62*  < > 4.80*  CALCIUM 8.9  < > 9.0  MG  --   --  2.2  AST 29  --   --   ALT 12*  --   --   ALKPHOS 48  --   --   BILITOT 1.4*  --   --   < > = values in this interval not displayed.  Cardiac Enzymes No results for input(s): TROPONINI in the last 168 hours.  Microbiology  Results  _0 @  RADIOLOGY:  No results found.    Current Discharge Medication List    START taking these medications   Details  azithromycin (ZITHROMAX) 250 MG tablet Take 1 tablet daily for 3 days then stop Qty: 3 each, Refills: 0    furosemide (LASIX) 80 MG tablet Take 1 tablet (80 mg total) by mouth every Tuesday, Thursday, Saturday, and _1 /20/18 0757    Code Status History    Date Active Date Inactive Code Status Order ID Comments User Context   11/15/2016  2:30 AM 11/16/2016  5:27 PM Full Code [id number]  Harvie Bridge, DO Inpatient   06/18/2016 11:09 AM 06/19/2016  5:15 AM Full Code [id number]  Sabino Dick, MD HOV   09/19/2015  2:01 PM 09/19/2015  7:18 PM Full Code [id number]  Corky Mull, MD Inpatient   07/01/2015  7:54 AM 07/01/2015  3:59 PM Full Code [id number]  Loletha Grayer, MD ED      TOTAL TIME TAKING CARE OF THIS PATIENT: 37 minutes.    Note: This dictation was prepared with Dragon dictation along with smaller phrase technology. Any transcriptional errors that result from this process are unintentional.  ,  M.D on 01/30/2017 at 1:41 PM  Between 7am to 6pm - Pager - [phone] After 6pm go to [email or web address] - password [email or web address] [facility]  Office  [phone]  CC: Primary care physician; Wilhemena Durie,  MD

## 2017-01-30 NOTE — Telephone Encounter (Signed)
Can you please call for transition of care-aa

## 2017-01-30 NOTE — Telephone Encounter (Signed)
PT is being discharged from [facility] and has a hospital f/u on 02/04/17. Thanks TNP

## 2017-01-31 DIAGNOSIS — N186 End stage renal disease: Secondary | ICD-10-CM | POA: Diagnosis not present

## 2017-01-31 DIAGNOSIS — D689 Coagulation defect, unspecified: Secondary | ICD-10-CM | POA: Diagnosis not present

## 2017-01-31 DIAGNOSIS — N2581 Secondary hyperparathyroidism of renal origin: Secondary | ICD-10-CM | POA: Diagnosis not present

## 2017-01-31 DIAGNOSIS — D631 Anemia in chronic kidney disease: Secondary | ICD-10-CM | POA: Diagnosis not present

## 2017-01-31 NOTE — Telephone Encounter (Signed)
Pt's wife [name] returned nurse call and request call back on her cell#. Thanks TNP

## 2017-01-31 NOTE — Telephone Encounter (Signed)
Transition Care Management Follow-up Telephone Call   Date discharged? 01/30/17   How have you been since you were released from the hospital? Patient feels ok. NO shortness of breath. Some coughing. No falls. Feels more steady on his feet.   Do you understand why you were in the hospital? yes   Do you understand the discharge instructions? yes   Where were you discharged to? home   Items Reviewed:  Medications reviewed: yes, and he was started on Prednisone, Lasix, Zpak.  Allergies reviewed: yes-NKDA  Dietary changes reviewed: yes-low sodium  Referrals reviewed: yes-patient was not referred to anyone else.   Functional Questionnaire:   Activities of Daily Living (ADLs):   He states they are independent in the following:  States they require assistance with the following: none.   Any transportation issues/concerns?: no   Any patient concerns? no   Confirmed importance and date/time of follow-up visits scheduled yes  Provider Appointment booked with Dr Rosanna Randy.  Confirmed with patient if condition begins to worsen call PCP or go to the ER.  Patient was given the office number and encouraged to call back with question or concerns.  : yes

## 2017-02-02 LAB — CULTURE, BLOOD (ROUTINE X 2)
Culture: NO GROWTH
Culture: NO GROWTH

## 2017-02-03 DIAGNOSIS — D631 Anemia in chronic kidney disease: Secondary | ICD-10-CM | POA: Diagnosis not present

## 2017-02-03 DIAGNOSIS — N186 End stage renal disease: Secondary | ICD-10-CM | POA: Diagnosis not present

## 2017-02-03 DIAGNOSIS — D689 Coagulation defect, unspecified: Secondary | ICD-10-CM | POA: Diagnosis not present

## 2017-02-03 DIAGNOSIS — N2581 Secondary hyperparathyroidism of renal origin: Secondary | ICD-10-CM | POA: Diagnosis not present

## 2017-02-03 NOTE — Telephone Encounter (Signed)
Thanks for taking care of this in my absence! -MM

## 2017-02-03 NOTE — Telephone Encounter (Signed)
No problem-aa

## 2017-02-04 ENCOUNTER — Ambulatory Visit (INDEPENDENT_AMBULATORY_CARE_PROVIDER_SITE_OTHER): Payer: Medicare Other | Admitting: Family Medicine

## 2017-02-04 ENCOUNTER — Encounter: Payer: Self-pay | Admitting: Family Medicine

## 2017-02-04 VITALS — BP 118/58 | HR 78 | Temp 97.8°F | Resp 16 | Wt 193.0 lb

## 2017-02-04 DIAGNOSIS — N185 Chronic kidney disease, stage 5: Secondary | ICD-10-CM | POA: Diagnosis not present

## 2017-02-04 DIAGNOSIS — J189 Pneumonia, unspecified organism: Secondary | ICD-10-CM | POA: Diagnosis not present

## 2017-02-04 NOTE — Progress Notes (Signed)
Patient: Frank Huang Male    DOB: 03-03-1932   83 y.o.   MRN: [id number] Visit Date: 02/04/2017  Today's Provider: Wilhemena Durie, MD   Chief Complaint  Patient presents with  . Hospitalization Follow-up   Subjective:    HPI  Follow up Hospitalization  Patient was admitted to [facility] on 01/28/2017 and discharged on 01/30/2017. He was treated for a fall and pneumonia. Treatment for this included Zpak, furosemide 80mg  and prednisone taper. He reports good compliance with treatment. He reports this condition is Improved.  None of the patient's medications were stopped. Patient was to take furosemide, zpak, and prednisone taper in addition to his daily meds.        No Known Allergies   Current Outpatient Prescriptions:  .  aspirin EC 81 MG tablet, Take 81 mg by mouth daily., Disp: , Rfl:  .  CYANOCOBALAMIN IJ, Inject 1 Dose as directed every 30 (thirty) days., Disp: , Rfl:  .  donepezil (ARICEPT) 10 MG tablet, Take 1 tablet (10 mg total) by mouth at bedtime., Disp: 90 tablet, Rfl: 3 .  furosemide (LASIX) 80 MG tablet, Take 1 tablet (80 mg total) by mouth every Tuesday, Thursday, Saturday, and Sunday., Disp: 30 tablet, Rfl: 0 .  lidocaine-prilocaine (EMLA) cream, , Disp: , Rfl:  .  lovastatin (MEVACOR) 40 MG tablet, TAKE 1 TABLET BY MOUTH EVERY DAY FOR HIGH CHOLESTEROL, Disp: , Rfl:  .  metoprolol tartrate (LOPRESSOR) 25 MG tablet, Take 0.5 tablets (12.5 mg total) by mouth 2 (two) times daily., Disp: 60 tablet, Rfl: 0 .  Multiple Vitamin (MULTI-VITAMINS) TABS, Take by mouth. Reported on 04/01/2016, Disp: , Rfl:  .  omeprazole (PRILOSEC) 20 MG capsule, TAKE 1 CAPSULE BY MOUTH  DAILY., Disp: 90 capsule, Rfl: 3 .  predniSONE (DELTASONE) 10 MG tablet, Take 1 tablet (10 mg total) by mouth daily with breakfast. 50 mg PO (ORAL)  x 2 days 40 mg PO (ORAL)  x 2 days 30 mg PO  (ORAL)  x 2 days 20 mg PO  (ORAL) x 2 days 10 mg PO  (ORAL) x 2 days then stop, Disp: 32 tablet, Rfl: 0 .   RENVELA 800 MG tablet, Take 800 mg by mouth 3 (three) times daily., Disp: , Rfl:  .  sucralfate (CARAFATE) 1 g tablet, Take 1 tablet (1 g total) by mouth 4 (four) times daily -  before meals and at bedtime., Disp: 120 tablet, Rfl: 11 .  azithromycin (ZITHROMAX) 250 MG tablet, Take 1 tablet daily for 3 days then stop (Patient not taking: Reported on 02/04/2017), Disp: 3 each, Rfl: 0  Review of Systems  Constitutional: Negative for activity change, appetite change, chills, diaphoresis, fatigue, fever and unexpected weight change.  Respiratory: Positive for wheezing. Negative for cough and shortness of breath.   Cardiovascular: Negative for chest pain, palpitations and leg swelling.    Social History  Substance Use Topics  . Smoking status: Current Every Day Smoker    Packs/day: 0.50    Types: Cigarettes  . Smokeless tobacco: Never Used     Comment: smokes about 3 cigarettes per week  . Alcohol use No   Objective:   BP (!) 118/58 (BP Location: Right Arm, Patient Position: Sitting, Cuff Size: Normal)   Pulse 78   Temp 97.8 F (36.6 C)   Resp 16   Wt 193 lb (87.5 kg)   SpO2 96%   BMI 27.69 kg/m  Vitals:   02/04/17  1033  BP: (!) 118/58  Pulse: 78  Resp: 16  Temp: 97.8 F (36.6 C)  SpO2: 96%  Weight: 193 lb (87.5 kg)     Physical Exam  Constitutional: He is oriented to person, place, and time. He appears well-developed and well-nourished.  HENT:  Head: Normocephalic and atraumatic.  Right Ear: External ear normal.  Left Ear: External ear normal.  Nose: Nose normal.  Mouth/Throat: Oropharynx is clear and moist.  Eyes: No scleral icterus.  Cardiovascular: Normal rate and normal heart sounds.   Pulmonary/Chest: Effort normal. He has rales.  Inspiratory and expiatory wheezing throughout all 4 lobes.   Musculoskeletal: He exhibits edema.  1+ edema bilaterally in lower extremities.   Neurological: He is alert and oriented to person, place, and time.  Skin: Skin is warm and  dry.  Psychiatric: He has a normal mood and affect. His behavior is normal. Judgment and thought content normal.        Assessment & Plan:     1. Community acquired pneumonia, unspecified laterality Repeat CXR 1-2 months  2. Chronic kidney disease, stage V ([place]) 3 Tobacco Abuse Pt encouraged to discontinue. I have done the exam and reviewed the chart and it is accurate to the best of my knowledge. Development worker, community has been used and  any errors in dictation or transcription are unintentional. Miguel Aschoff M.D. [place], MD  [facility]

## 2017-02-05 DIAGNOSIS — D631 Anemia in chronic kidney disease: Secondary | ICD-10-CM | POA: Diagnosis not present

## 2017-02-05 DIAGNOSIS — N2581 Secondary hyperparathyroidism of renal origin: Secondary | ICD-10-CM | POA: Diagnosis not present

## 2017-02-05 DIAGNOSIS — D689 Coagulation defect, unspecified: Secondary | ICD-10-CM | POA: Diagnosis not present

## 2017-02-05 DIAGNOSIS — N186 End stage renal disease: Secondary | ICD-10-CM | POA: Diagnosis not present

## 2017-02-06 ENCOUNTER — Ambulatory Visit: Payer: Medicare Other | Admitting: Family Medicine

## 2017-02-07 DIAGNOSIS — N186 End stage renal disease: Secondary | ICD-10-CM | POA: Diagnosis not present

## 2017-02-07 DIAGNOSIS — N2581 Secondary hyperparathyroidism of renal origin: Secondary | ICD-10-CM | POA: Diagnosis not present

## 2017-02-07 DIAGNOSIS — D631 Anemia in chronic kidney disease: Secondary | ICD-10-CM | POA: Diagnosis not present

## 2017-02-07 DIAGNOSIS — D689 Coagulation defect, unspecified: Secondary | ICD-10-CM | POA: Diagnosis not present

## 2017-02-08 DIAGNOSIS — I120 Hypertensive chronic kidney disease with stage 5 chronic kidney disease or end stage renal disease: Secondary | ICD-10-CM | POA: Diagnosis not present

## 2017-02-08 DIAGNOSIS — N186 End stage renal disease: Secondary | ICD-10-CM | POA: Diagnosis not present

## 2017-02-08 DIAGNOSIS — Z992 Dependence on renal dialysis: Secondary | ICD-10-CM | POA: Diagnosis not present

## 2017-02-10 DIAGNOSIS — Z23 Encounter for immunization: Secondary | ICD-10-CM | POA: Diagnosis not present

## 2017-02-10 DIAGNOSIS — D689 Coagulation defect, unspecified: Secondary | ICD-10-CM | POA: Diagnosis not present

## 2017-02-10 DIAGNOSIS — D631 Anemia in chronic kidney disease: Secondary | ICD-10-CM | POA: Diagnosis not present

## 2017-02-10 DIAGNOSIS — N186 End stage renal disease: Secondary | ICD-10-CM | POA: Diagnosis not present

## 2017-02-10 DIAGNOSIS — N2581 Secondary hyperparathyroidism of renal origin: Secondary | ICD-10-CM | POA: Diagnosis not present

## 2017-02-12 DIAGNOSIS — D689 Coagulation defect, unspecified: Secondary | ICD-10-CM | POA: Diagnosis not present

## 2017-02-12 DIAGNOSIS — N2581 Secondary hyperparathyroidism of renal origin: Secondary | ICD-10-CM | POA: Diagnosis not present

## 2017-02-12 DIAGNOSIS — Z23 Encounter for immunization: Secondary | ICD-10-CM | POA: Diagnosis not present

## 2017-02-12 DIAGNOSIS — D631 Anemia in chronic kidney disease: Secondary | ICD-10-CM | POA: Diagnosis not present

## 2017-02-12 DIAGNOSIS — N186 End stage renal disease: Secondary | ICD-10-CM | POA: Diagnosis not present

## 2017-02-13 ENCOUNTER — Telehealth: Payer: Self-pay

## 2017-02-13 NOTE — Telephone Encounter (Signed)
Mrs Banko called and stated that you had told them on their last visit that you wanted the nephrologist to be adjusting his diuretic since he is at dialysis three times per week.   Mrs Kempfer said she told them but they said you would need to give them an order for them to take over.  Patient is seen by  [facility] [address] [phone]  They are aware that this will not be done until next week. Please advise

## 2017-02-14 DIAGNOSIS — Z23 Encounter for immunization: Secondary | ICD-10-CM | POA: Diagnosis not present

## 2017-02-14 DIAGNOSIS — N2581 Secondary hyperparathyroidism of renal origin: Secondary | ICD-10-CM | POA: Diagnosis not present

## 2017-02-14 DIAGNOSIS — D689 Coagulation defect, unspecified: Secondary | ICD-10-CM | POA: Diagnosis not present

## 2017-02-14 DIAGNOSIS — D631 Anemia in chronic kidney disease: Secondary | ICD-10-CM | POA: Diagnosis not present

## 2017-02-14 DIAGNOSIS — N186 End stage renal disease: Secondary | ICD-10-CM | POA: Diagnosis not present

## 2017-02-17 ENCOUNTER — Other Ambulatory Visit: Payer: Self-pay | Admitting: Family Medicine

## 2017-02-17 DIAGNOSIS — D631 Anemia in chronic kidney disease: Secondary | ICD-10-CM | POA: Diagnosis not present

## 2017-02-17 DIAGNOSIS — Z23 Encounter for immunization: Secondary | ICD-10-CM | POA: Diagnosis not present

## 2017-02-17 DIAGNOSIS — R413 Other amnesia: Secondary | ICD-10-CM

## 2017-02-17 DIAGNOSIS — D689 Coagulation defect, unspecified: Secondary | ICD-10-CM | POA: Diagnosis not present

## 2017-02-17 DIAGNOSIS — N2581 Secondary hyperparathyroidism of renal origin: Secondary | ICD-10-CM | POA: Diagnosis not present

## 2017-02-17 DIAGNOSIS — N186 End stage renal disease: Secondary | ICD-10-CM | POA: Diagnosis not present

## 2017-02-17 NOTE — Telephone Encounter (Signed)
Ok to refer.

## 2017-02-17 NOTE — Telephone Encounter (Signed)
Dr Rosanna Randy, They are not asking for a referral.  They need for you to let the Nephrologist know that you want them to handle his diuretic as discussed during Frank Huang's last visit. Thanks ED

## 2017-02-18 DIAGNOSIS — I2510 Atherosclerotic heart disease of native coronary artery without angina pectoris: Secondary | ICD-10-CM | POA: Diagnosis not present

## 2017-02-18 DIAGNOSIS — I4891 Unspecified atrial fibrillation: Secondary | ICD-10-CM | POA: Diagnosis not present

## 2017-02-18 DIAGNOSIS — I10 Essential (primary) hypertension: Secondary | ICD-10-CM | POA: Diagnosis not present

## 2017-02-18 DIAGNOSIS — E7800 Pure hypercholesterolemia, unspecified: Secondary | ICD-10-CM | POA: Diagnosis not present

## 2017-02-18 DIAGNOSIS — I481 Persistent atrial fibrillation: Secondary | ICD-10-CM | POA: Diagnosis not present

## 2017-02-19 ENCOUNTER — Inpatient Hospital Stay: Payer: Medicare Other | Attending: Hematology and Oncology

## 2017-02-19 ENCOUNTER — Inpatient Hospital Stay (HOSPITAL_BASED_OUTPATIENT_CLINIC_OR_DEPARTMENT_OTHER): Payer: Medicare Other | Admitting: Hematology and Oncology

## 2017-02-19 ENCOUNTER — Encounter: Payer: Self-pay | Admitting: Hematology and Oncology

## 2017-02-19 VITALS — BP 100/59 | HR 80 | Temp 96.8°F | Resp 18 | Wt 190.0 lb

## 2017-02-19 DIAGNOSIS — C9000 Multiple myeloma not having achieved remission: Secondary | ICD-10-CM

## 2017-02-19 DIAGNOSIS — F17210 Nicotine dependence, cigarettes, uncomplicated: Secondary | ICD-10-CM | POA: Diagnosis not present

## 2017-02-19 DIAGNOSIS — I2510 Atherosclerotic heart disease of native coronary artery without angina pectoris: Secondary | ICD-10-CM

## 2017-02-19 DIAGNOSIS — D631 Anemia in chronic kidney disease: Secondary | ICD-10-CM | POA: Insufficient documentation

## 2017-02-19 DIAGNOSIS — Z800 Family history of malignant neoplasm of digestive organs: Secondary | ICD-10-CM

## 2017-02-19 DIAGNOSIS — Z808 Family history of malignant neoplasm of other organs or systems: Secondary | ICD-10-CM | POA: Insufficient documentation

## 2017-02-19 DIAGNOSIS — Z79899 Other long term (current) drug therapy: Secondary | ICD-10-CM | POA: Insufficient documentation

## 2017-02-19 DIAGNOSIS — K118 Other diseases of salivary glands: Secondary | ICD-10-CM

## 2017-02-19 DIAGNOSIS — Z8673 Personal history of transient ischemic attack (TIA), and cerebral infarction without residual deficits: Secondary | ICD-10-CM | POA: Diagnosis not present

## 2017-02-19 DIAGNOSIS — Z7952 Long term (current) use of systemic steroids: Secondary | ICD-10-CM

## 2017-02-19 DIAGNOSIS — K219 Gastro-esophageal reflux disease without esophagitis: Secondary | ICD-10-CM | POA: Insufficient documentation

## 2017-02-19 DIAGNOSIS — N186 End stage renal disease: Secondary | ICD-10-CM | POA: Diagnosis not present

## 2017-02-19 DIAGNOSIS — M1990 Unspecified osteoarthritis, unspecified site: Secondary | ICD-10-CM | POA: Diagnosis not present

## 2017-02-19 DIAGNOSIS — Z7982 Long term (current) use of aspirin: Secondary | ICD-10-CM | POA: Diagnosis not present

## 2017-02-19 DIAGNOSIS — I5020 Unspecified systolic (congestive) heart failure: Secondary | ICD-10-CM

## 2017-02-19 DIAGNOSIS — J449 Chronic obstructive pulmonary disease, unspecified: Secondary | ICD-10-CM | POA: Insufficient documentation

## 2017-02-19 DIAGNOSIS — E538 Deficiency of other specified B group vitamins: Secondary | ICD-10-CM

## 2017-02-19 DIAGNOSIS — N2581 Secondary hyperparathyroidism of renal origin: Secondary | ICD-10-CM | POA: Diagnosis not present

## 2017-02-19 DIAGNOSIS — Z992 Dependence on renal dialysis: Secondary | ICD-10-CM | POA: Insufficient documentation

## 2017-02-19 DIAGNOSIS — I252 Old myocardial infarction: Secondary | ICD-10-CM | POA: Insufficient documentation

## 2017-02-19 DIAGNOSIS — D696 Thrombocytopenia, unspecified: Secondary | ICD-10-CM

## 2017-02-19 DIAGNOSIS — D689 Coagulation defect, unspecified: Secondary | ICD-10-CM | POA: Diagnosis not present

## 2017-02-19 DIAGNOSIS — Z23 Encounter for immunization: Secondary | ICD-10-CM | POA: Diagnosis not present

## 2017-02-19 LAB — COMPREHENSIVE METABOLIC PANEL WITH GFR
ALT: 13 U/L — ABNORMAL LOW (ref 17–63)
AST: 21 U/L (ref 15–41)
Albumin: 3.6 g/dL (ref 3.5–5.0)
Alkaline Phosphatase: 70 U/L (ref 38–126)
Anion gap: 9 (ref 5–15)
BUN: 17 mg/dL (ref 6–20)
CO2: 33 mmol/L — ABNORMAL HIGH (ref 22–32)
Calcium: 8.8 mg/dL — ABNORMAL LOW (ref 8.9–10.3)
Chloride: 95 mmol/L — ABNORMAL LOW (ref 101–111)
Creatinine, Ser: 3 mg/dL — ABNORMAL HIGH (ref 0.61–1.24)
GFR calc Af Amer: 21 mL/min — ABNORMAL LOW
GFR calc non Af Amer: 18 mL/min — ABNORMAL LOW
Glucose, Bld: 98 mg/dL (ref 65–99)
Potassium: 3.4 mmol/L — ABNORMAL LOW (ref 3.5–5.1)
Sodium: 137 mmol/L (ref 135–145)
Total Bilirubin: 0.6 mg/dL (ref 0.3–1.2)
Total Protein: 6.9 g/dL (ref 6.5–8.1)

## 2017-02-19 LAB — CBC WITH DIFFERENTIAL/PLATELET
Basophils Absolute: 0 10*3/uL (ref 0–0.1)
Basophils Relative: 1 %
Eosinophils Absolute: 0.1 10*3/uL (ref 0–0.7)
Eosinophils Relative: 3 %
HCT: 27.9 % — ABNORMAL LOW (ref 40.0–52.0)
Hemoglobin: 9 g/dL — ABNORMAL LOW (ref 13.0–18.0)
Lymphocytes Relative: 23 %
Lymphs Abs: 0.9 10*3/uL — ABNORMAL LOW (ref 1.0–3.6)
MCH: 32.6 pg (ref 26.0–34.0)
MCHC: 32.3 g/dL (ref 32.0–36.0)
MCV: 101.1 fL — ABNORMAL HIGH (ref 80.0–100.0)
Monocytes Absolute: 0.2 10*3/uL (ref 0.2–1.0)
Monocytes Relative: 5 %
Neutro Abs: 2.6 10*3/uL (ref 1.4–6.5)
Neutrophils Relative %: 68 %
Platelets: 74 10*3/uL — ABNORMAL LOW (ref 150–440)
RBC: 2.76 MIL/uL — ABNORMAL LOW (ref 4.40–5.90)
RDW: 17 % — ABNORMAL HIGH (ref 11.5–14.5)
WBC: 3.8 10*3/uL (ref 3.8–10.6)

## 2017-02-19 NOTE — Progress Notes (Signed)
Patient saw his cardiologist yesterday.  Patient offers no concerns today.

## 2017-02-19 NOTE — Progress Notes (Signed)
[facility]  Clinic day:  02/19/2017   Chief Complaint: Frank Huang is a 83 y.o. male with smoldering multiple myeloma who is seen for 3 month assessment.  HPI:  The patient was last seen in the medical oncology clinic on 11/20/2016.  At that time, he denied any complaint.  Exam was stable.  Labs included a hematocrit 31.4 and hemoglobin 10.4. Platelet count was 145,000.  SPEP revealed a 0.6 g/dL monoclonal spike.  Symptomatically, he feels fine. He is on oral B12.  He continues dialysis 3 x/week.  Regarding his parotid dodule, he is has imaging scheduled and follow-up with Dr Bud Face on 07/01/2017.   Past Medical History:  Diagnosis Date  . Cancer (HCC)   . Chronic kidney disease   . COPD (chronic obstructive pulmonary disease) (HCC)   . Coronary artery disease   . GERD (gastroesophageal reflux disease)   . Multiple myeloma (HCC)   . Myocardial infarction 2005  . Neuropathy (HCC)   . Shortness of breath dyspnea   . Stroke (cerebrum) (HCC)    weakness Lt hand  . Systolic CHF [facility])     Past Surgical History:  Procedure Laterality Date  . APPENDECTOMY    . AV FISTULA PLACEMENT Left   . CARDIAC CATHETERIZATION N/A 11/15/2016   Procedure: Left Heart Cath and Coronary Angiography;  Surgeon: Marcina Millard, MD;  Location: [facility] INVASIVE CV LAB;  Service: Cardiovascular;  Laterality: N/A;  . CHOLECYSTECTOMY    . SHOULDER ARTHROSCOPY WITH OPEN ROTATOR CUFF REPAIR Left 09/19/2015   Procedure: SHOULDER ARTHROSCOPY , subacromial decompression, debridement;  Surgeon: Christena Flake, MD;  Location: [facility] ORS;  Service: Orthopedics;  Laterality: Left;  . TOTAL HIP ARTHROPLASTY Left     Family History  Problem Relation Age of Onset  . Alzheimer's disease Mother   . Kidney failure Father   . Bone cancer Sister   . Stomach cancer Sister     Social History:  reports that he has been smoking Cigarettes.  He has been smoking about 0.50 packs per  day. He has never used smokeless tobacco. He reports that he does not drink alcohol or use drugs.  He began smoking at age 33. He smokes 20 packs per month (2 cartons). He has a 30-40 pack year smoking history.  The patient is accompanied by his wife, Frank Huang, today.  Allergies: No Known Allergies  Current Medications: Current Outpatient Prescriptions  Medication Sig Dispense Refill  . aspirin EC 81 MG tablet Take 81 mg by mouth daily.    . CYANOCOBALAMIN IJ Inject 1 Dose as directed every 30 (thirty) days.    Marland Kitchen donepezil (ARICEPT) 10 MG tablet TAKE 1 TABLET BY MOUTH AT  BEDTIME 90 tablet 3  . ipratropium (ATROVENT) 0.03 % nasal spray Place 2 sprays into both nostrils every 12 (twelve) hours.    . lidocaine-prilocaine (EMLA) cream     . lovastatin (MEVACOR) 40 MG tablet TAKE 1 TABLET BY MOUTH EVERY DAY FOR HIGH CHOLESTEROL    . metoprolol tartrate (LOPRESSOR) 25 MG tablet Take 0.5 tablets (12.5 mg total) by mouth 2 (two) times daily. 60 tablet 0  . Multiple Vitamin (MULTI-VITAMINS) TABS Take by mouth. Reported on 04/01/2016    . omeprazole (PRILOSEC) 20 MG capsule TAKE 1 CAPSULE BY MOUTH  DAILY. 90 capsule 3  . RENVELA 800 MG tablet Take 800 mg by mouth 3 (three) times daily.    . sucralfate (CARAFATE) 1 g tablet Take 1  tablet (1 g total) by mouth 4 (four) times daily -  before meals and at bedtime. 120 tablet 11  . azithromycin (ZITHROMAX) 250 MG tablet Take 1 tablet daily for 3 days then stop (Patient not taking: Reported on 02/04/2017) 3 each 0  . furosemide (LASIX) 80 MG tablet Take 1 tablet (80 mg total) by mouth every Tuesday, Thursday, Saturday, and Sunday. (Patient not taking: Reported on 02/19/2017) 30 tablet 0  . predniSONE (DELTASONE) 10 MG tablet Take 1 tablet (10 mg total) by mouth daily with breakfast. 50 mg PO (ORAL)  x 2 days 40 mg PO (ORAL)  x 2 days 30 mg PO  (ORAL)  x 2 days 20 mg PO  (ORAL) x 2 days 10 mg PO  (ORAL) x 2 days then stop (Patient not taking: Reported on  02/19/2017) 32 tablet 0   No current facility-administered medications for this visit.     Review of Systems:  GENERAL:  Feels "fine".  No fevers or sweats.  Weight down 7 pounds. PERFORMANCE STATUS (ECOG):  1 HEENT:  No visual changes, runny nose, sore throat, mouth sores or tenderness. Lungs: No shortness of breath or cough.  No hemoptysis. Cardiac:  No chest pain, palpitations, orthopnea, or PND. GI:  No nausea, vomiting, diarrhea, constipation, melena or hematochezia. GU:  Dialysis every MWF.  No urgency, frequency, dysuria, or hematuria. Musculoskeletal:  Left hip discomfort s/p replacement.  Back pain (chronic).  Right knee issues (see HPI).  No muscle tenderness. Extremities:  No pain or swelling. Skin:  Bruises easily on baby aspirin.  No rashes or skin changes. Neuro:  Left foot numbness. No headache,weakness, balance or coordination issues. Endocrine:  No diabetes, thyroid issues, hot flashes or night sweats. Psych:  No mood changes, depression or anxiety. Pain:  No focal pain. Review of systems:  All other systems reviewed and found to be negative.  Physical Exam: Blood pressure (!) 100/59, pulse 80, temperature (!) 96.8 F (36 C), temperature source Tympanic, resp. rate 18, weight 190 lb 0.6 oz (86.2 kg). GENERAL:  Well developed, well nourished, gentleman sitting comfortably in the exam room in no acute distress. MENTAL STATUS:  Alert and oriented to person, place and time. HEAD:  Thin gray hair.  Male pattern baldness.  Normocephalic, atraumatic, face symmetric, no Cushingoid features. EYES:  Silver rimmed glasses.  Blue eyes.  Pupils equal round and reactive to light and accomodation.  No conjunctivitis or scleral icterus. ENT:  Oropharynx clear without lesion.  Tongue normal.  Dentures.  Mucous membranes moist.  RESPIRATORY:  Clear to auscultation without rales, wheezes or rhonchi. CARDIOVASCULAR:  Regular rate and rhythm without murmur, rub or gallop. ABDOMEN:  Soft,  non-tender, with active bowel sounds, and no hepatosplenomegaly.  No masses. SKIN:  Multiple moles.  No rashes, ulcers or lesions. EXTREMITIES: Chronic bilateral lower extremity edema.  No skin discoloration.  No palpable cords. LYMPH NODES:  No palpable cervical, supraclavicular, axillary or inguinal adenopathy  NEUROLOGICAL: Unremarkable. PSYCH:  Appropriate.   Appointment on 02/19/2017  Component Date Value Ref Range Status  . WBC 02/19/2017 3.8  3.8 - 10.6 K/uL Final  . RBC 02/19/2017 2.76* 4.40 - 5.90 MIL/uL Final  . Hemoglobin 02/19/2017 9.0* 13.0 - 18.0 g/dL Final  . HCT 02/19/2017 27.9* 40.0 - 52.0 % Final  . MCV 02/19/2017 101.1* 80.0 - 100.0 fL Final  . MCH 02/19/2017 32.6  26.0 - 34.0 pg Final  . MCHC 02/19/2017 32.3  32.0 - 36.0 g/dL Final  .  RDW 02/19/2017 17.0* 11.5 - 14.5 % Final  . Platelets 02/19/2017 74* 150 - 440 K/uL Final  . Neutrophils Relative % 02/19/2017 68  % Final  . Neutro Abs 02/19/2017 2.6  1.4 - 6.5 K/uL Final  . Lymphocytes Relative 02/19/2017 23  % Final  . Lymphs Abs 02/19/2017 0.9* 1.0 - 3.6 K/uL Final  . Monocytes Relative 02/19/2017 5  % Final  . Monocytes Absolute 02/19/2017 0.2  0.2 - 1.0 K/uL Final  . Eosinophils Relative 02/19/2017 3  % Final  . Eosinophils Absolute 02/19/2017 0.1  0 - 0.7 K/uL Final  . Basophils Relative 02/19/2017 1  % Final  . Basophils Absolute 02/19/2017 0.0  0 - 0.1 K/uL Final  . Sodium 02/19/2017 137  135 - 145 mmol/L Final  . Potassium 02/19/2017 3.4* 3.5 - 5.1 mmol/L Final  . Chloride 02/19/2017 95* 101 - 111 mmol/L Final  . CO2 02/19/2017 33* 22 - 32 mmol/L Final  . Glucose, Bld 02/19/2017 98  65 - 99 mg/dL Final  . BUN 02/19/2017 17  6 - 20 mg/dL Final  . Creatinine, Ser 02/19/2017 3.00* 0.61 - 1.24 mg/dL Final  . Calcium 02/19/2017 8.8* 8.9 - 10.3 mg/dL Final  . Total Protein 02/19/2017 6.9  6.5 - 8.1 g/dL Final  . Albumin 02/19/2017 3.6  3.5 - 5.0 g/dL Final  . AST 02/19/2017 21  15 - 41 U/L Final  . ALT  02/19/2017 13* 17 - 63 U/L Final  . Alkaline Phosphatase 02/19/2017 70  38 - 126 U/L Final  . Total Bilirubin 02/19/2017 0.6  0.3 - 1.2 mg/dL Final  . GFR calc non Af Amer 02/19/2017 18* >60 mL/min Final  . GFR calc Af Amer 02/19/2017 21* >60 mL/min Final   Comment: (NOTE) The eGFR has been calculated using the CKD EPI equation. This calculation has not been validated in all clinical situations. eGFR's persistently <60 mL/min signify possible Chronic Kidney Disease.   . Anion gap 02/19/2017 9  5 - 15 Final    Assessment:  RAHM MINIX is a 83 y.o. 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  Bone marrow revealed 20% plasma cells. He was treated with thalidomide for 7 months then discontinued in 06/2005 secondary to rash and depression. He was treated briefly with Revlimid in 2013 which was also discontinued secondary to rash.    M spike has been followed: 0.5 gm/dL on 06/15/2013, 0.8 gm/dL on 01/19/2014, 0.5 gm/dL on 11/22/2014, 0.9 on 06/28/2015, 0.7 on 09/20/2015, 0.6 on 01/02/2016, 0.8 on 04/16/2016, 0.6 on 08/21/2016, 0.6 on 11/20/2016, and 0.6 on 02/19/2017.  Kappa free light chains were 512 in 06/15/2013, 686 11/02/2013, 929 on 01/19/2014, 1212 in 05/10/2014, 1409 on 11/22/2014, 1119 on 02/28/2015, 851 (ratio 63.84) on 06/28/2015, 1006 (ratio 74.50) on 09/20/2015, 1133 (ratio 59.94) on 01/02/2016, 785.6 (ratio 24.32) on 04/16/2016, 834.9 (ratio 19.19) on 08/21/2016, 627 (ratio 24.4) on 02/19/2017.  He has subsequently been followed off therapy.  He had a hip fracture in in 12/2013.  Per notes, pathology revealed no myeloma.  Bone survey on 01/17/2015 revealed a 9 mm lytic lesion in the frontoparietal region (previously 7 mm).   He has end-stage renal disease unrelated to his myeloma.  He has been on dialysis since 11/2013 (MWF).   He has anemia due to chronic renal insufficiency.  He has a history of iron deficiency. He had a negative  colonoscopy and upper endoscopy in 08/2004. Notes indicate he  also had a small bowel capsule study. EGD and colonoscopy in 2014 revealed gastric lesions which were cauterized. He receives IV iron and Procrit with dialysis.  Ferritin is elevated (? acute phase reactant as ESR elevated).  Ferritin was 1121 on 07/05/2015 and 743 on 01/02/2016.  Iron saturation was 30% on 07/05/2015.  He has B12 deficiency.  B12 was 120 on 07/05/2015.  Folate was 10.8.  He was on B12 IM monthy (last on 08/02/2016 with primary care).  He is on oral B12.   He has a history of left shoulder discomfort.  MRI on 06/14/2015 revealed a tendinopathy, osteoarthritis, and capsulitis.   Abdomen and pelvic CT scan on 04/30/2016 revealed a 1.3 cm irregularly marginated nodular lesion in the anterior left lower lobe. There was a subtle area of slightly diminished attenuation near the tail of the pancreas of uncertain significance. There were multiple renal lesions (some did not represent simple cysts).   Chest CT on 05/03/2016 revealed a 1.05 cm ill-defined slightly spiculated irregular nodule in the left lower lobe as well as a 0.8 cm nodule in the right lower lobe.  PET scan on 05/16/2016 revealed no evidence of active skeletal multiple myeloma or plasmacytoma.  The LEFT lower lobe pulmonary nodule was 9 mm and had mild metabolic activity (SUV 1.5).  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  Chest CT on 08/20/2016 revealed interval resolution of bilateral lower lobe nodules c/w inflammatory or infectious process.  Ultrasound-guided biopsy of the right parotid nodule on 06/18/2016 was unsuccessful. He was seen by Dr. Carloyn Manner on 06/24/2016.  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 He is followed by Dr. Pryor Ochoa.  He had a myocardial infarction on 11/14/2016.  He is being medically  managed.  Symptomatically, he feels fine.  Exam is stable.  Platelet count has drifted down to 74,000.  SPEP is stable.  Plan: 1.  Labs today:  CBC with diff, CMP, SPEP, free light chains. 2.  Follow-up with Dr. Pryor Ochoa as scheduled re: parotid nodule. 3.  RTC in 3 months for labs (CBC with diff, CMP, SPEP, free light chains). 4.  RTC in 6 months for MD assessment and labs (CBC with diff, CMP, SPEP, free light chains).  Addendum:  Will recheck CBC in 1 month.  If thrombocytopenia persists, a work-up will be performed.   Lequita Asal, MD  02/19/2017, 2:05 PM

## 2017-02-20 LAB — PROTEIN ELECTROPHORESIS, SERUM
A/G Ratio: 1.4 (ref 0.7–1.7)
Albumin ELP: 3.7 g/dL (ref 2.9–4.4)
Alpha-1-Globulin: 0.3 g/dL (ref 0.0–0.4)
Alpha-2-Globulin: 0.6 g/dL (ref 0.4–1.0)
Beta Globulin: 1.5 g/dL — ABNORMAL HIGH (ref 0.7–1.3)
Gamma Globulin: 0.3 g/dL — ABNORMAL LOW (ref 0.4–1.8)
Globulin, Total: 2.7 g/dL (ref 2.2–3.9)
M-Spike, %: 0.6 g/dL — ABNORMAL HIGH
Total Protein ELP: 6.4 g/dL (ref 6.0–8.5)

## 2017-02-20 LAB — KAPPA/LAMBDA LIGHT CHAINS
Kappa free light chain: 627 mg/L — ABNORMAL HIGH (ref 3.3–19.4)
Kappa, lambda light chain ratio: 24.4 — ABNORMAL HIGH (ref 0.26–1.65)
Lambda free light chains: 25.7 mg/L (ref 5.7–26.3)

## 2017-02-20 LAB — HEPATITIS B SURFACE ANTIBODY,QUALITATIVE: Hep B S Ab: REACTIVE

## 2017-02-20 LAB — HEPATITIS B SURFACE ANTIGEN: HEP B S AG: NEGATIVE

## 2017-02-20 LAB — HEPATITIS B CORE ANTIBODY, TOTAL: HEP B C TOTAL AB: NEGATIVE

## 2017-02-21 DIAGNOSIS — D631 Anemia in chronic kidney disease: Secondary | ICD-10-CM | POA: Diagnosis not present

## 2017-02-21 DIAGNOSIS — D689 Coagulation defect, unspecified: Secondary | ICD-10-CM | POA: Diagnosis not present

## 2017-02-21 DIAGNOSIS — Z23 Encounter for immunization: Secondary | ICD-10-CM | POA: Diagnosis not present

## 2017-02-21 DIAGNOSIS — N186 End stage renal disease: Secondary | ICD-10-CM | POA: Diagnosis not present

## 2017-02-21 DIAGNOSIS — N2581 Secondary hyperparathyroidism of renal origin: Secondary | ICD-10-CM | POA: Diagnosis not present

## 2017-02-24 DIAGNOSIS — Z23 Encounter for immunization: Secondary | ICD-10-CM | POA: Diagnosis not present

## 2017-02-24 DIAGNOSIS — D689 Coagulation defect, unspecified: Secondary | ICD-10-CM | POA: Diagnosis not present

## 2017-02-24 DIAGNOSIS — N2581 Secondary hyperparathyroidism of renal origin: Secondary | ICD-10-CM | POA: Diagnosis not present

## 2017-02-24 DIAGNOSIS — N186 End stage renal disease: Secondary | ICD-10-CM | POA: Diagnosis not present

## 2017-02-24 DIAGNOSIS — D631 Anemia in chronic kidney disease: Secondary | ICD-10-CM | POA: Diagnosis not present

## 2017-02-26 DIAGNOSIS — D631 Anemia in chronic kidney disease: Secondary | ICD-10-CM | POA: Diagnosis not present

## 2017-02-26 DIAGNOSIS — D689 Coagulation defect, unspecified: Secondary | ICD-10-CM | POA: Diagnosis not present

## 2017-02-26 DIAGNOSIS — N186 End stage renal disease: Secondary | ICD-10-CM | POA: Diagnosis not present

## 2017-02-26 DIAGNOSIS — Z23 Encounter for immunization: Secondary | ICD-10-CM | POA: Diagnosis not present

## 2017-02-26 DIAGNOSIS — N2581 Secondary hyperparathyroidism of renal origin: Secondary | ICD-10-CM | POA: Diagnosis not present

## 2017-02-28 DIAGNOSIS — N2581 Secondary hyperparathyroidism of renal origin: Secondary | ICD-10-CM | POA: Diagnosis not present

## 2017-02-28 DIAGNOSIS — N186 End stage renal disease: Secondary | ICD-10-CM | POA: Diagnosis not present

## 2017-02-28 DIAGNOSIS — Z23 Encounter for immunization: Secondary | ICD-10-CM | POA: Diagnosis not present

## 2017-02-28 DIAGNOSIS — D689 Coagulation defect, unspecified: Secondary | ICD-10-CM | POA: Diagnosis not present

## 2017-02-28 DIAGNOSIS — D631 Anemia in chronic kidney disease: Secondary | ICD-10-CM | POA: Diagnosis not present

## 2017-03-03 DIAGNOSIS — N2581 Secondary hyperparathyroidism of renal origin: Secondary | ICD-10-CM | POA: Diagnosis not present

## 2017-03-03 DIAGNOSIS — D689 Coagulation defect, unspecified: Secondary | ICD-10-CM | POA: Diagnosis not present

## 2017-03-03 DIAGNOSIS — D631 Anemia in chronic kidney disease: Secondary | ICD-10-CM | POA: Diagnosis not present

## 2017-03-03 DIAGNOSIS — N186 End stage renal disease: Secondary | ICD-10-CM | POA: Diagnosis not present

## 2017-03-03 DIAGNOSIS — Z23 Encounter for immunization: Secondary | ICD-10-CM | POA: Diagnosis not present

## 2017-03-05 DIAGNOSIS — D631 Anemia in chronic kidney disease: Secondary | ICD-10-CM | POA: Diagnosis not present

## 2017-03-05 DIAGNOSIS — D689 Coagulation defect, unspecified: Secondary | ICD-10-CM | POA: Diagnosis not present

## 2017-03-05 DIAGNOSIS — Z23 Encounter for immunization: Secondary | ICD-10-CM | POA: Diagnosis not present

## 2017-03-05 DIAGNOSIS — N2581 Secondary hyperparathyroidism of renal origin: Secondary | ICD-10-CM | POA: Diagnosis not present

## 2017-03-05 DIAGNOSIS — N186 End stage renal disease: Secondary | ICD-10-CM | POA: Diagnosis not present

## 2017-03-07 DIAGNOSIS — N186 End stage renal disease: Secondary | ICD-10-CM | POA: Diagnosis not present

## 2017-03-07 DIAGNOSIS — Z23 Encounter for immunization: Secondary | ICD-10-CM | POA: Diagnosis not present

## 2017-03-07 DIAGNOSIS — N2581 Secondary hyperparathyroidism of renal origin: Secondary | ICD-10-CM | POA: Diagnosis not present

## 2017-03-07 DIAGNOSIS — D689 Coagulation defect, unspecified: Secondary | ICD-10-CM | POA: Diagnosis not present

## 2017-03-07 DIAGNOSIS — D631 Anemia in chronic kidney disease: Secondary | ICD-10-CM | POA: Diagnosis not present

## 2017-03-10 DIAGNOSIS — D689 Coagulation defect, unspecified: Secondary | ICD-10-CM | POA: Diagnosis not present

## 2017-03-10 DIAGNOSIS — N2581 Secondary hyperparathyroidism of renal origin: Secondary | ICD-10-CM | POA: Diagnosis not present

## 2017-03-10 DIAGNOSIS — Z992 Dependence on renal dialysis: Secondary | ICD-10-CM | POA: Diagnosis not present

## 2017-03-10 DIAGNOSIS — I120 Hypertensive chronic kidney disease with stage 5 chronic kidney disease or end stage renal disease: Secondary | ICD-10-CM | POA: Diagnosis not present

## 2017-03-10 DIAGNOSIS — N186 End stage renal disease: Secondary | ICD-10-CM | POA: Diagnosis not present

## 2017-03-10 DIAGNOSIS — D631 Anemia in chronic kidney disease: Secondary | ICD-10-CM | POA: Diagnosis not present

## 2017-03-10 DIAGNOSIS — Z23 Encounter for immunization: Secondary | ICD-10-CM | POA: Diagnosis not present

## 2017-03-12 DIAGNOSIS — D631 Anemia in chronic kidney disease: Secondary | ICD-10-CM | POA: Diagnosis not present

## 2017-03-12 DIAGNOSIS — N186 End stage renal disease: Secondary | ICD-10-CM | POA: Diagnosis not present

## 2017-03-12 DIAGNOSIS — N2581 Secondary hyperparathyroidism of renal origin: Secondary | ICD-10-CM | POA: Diagnosis not present

## 2017-03-12 DIAGNOSIS — D689 Coagulation defect, unspecified: Secondary | ICD-10-CM | POA: Diagnosis not present

## 2017-03-13 ENCOUNTER — Ambulatory Visit
Admission: RE | Admit: 2017-03-13 | Discharge: 2017-03-13 | Disposition: A | Discharge status: Home / Self Care | Payer: Medicare Other | Source: Ambulatory Visit | Attending: Family Medicine | Admitting: Family Medicine

## 2017-03-13 ENCOUNTER — Encounter: Payer: Self-pay | Admitting: Family Medicine

## 2017-03-13 ENCOUNTER — Ambulatory Visit (INDEPENDENT_AMBULATORY_CARE_PROVIDER_SITE_OTHER): Payer: Medicare Other | Admitting: Family Medicine

## 2017-03-13 VITALS — BP 108/56 | HR 68 | Temp 98.6°F | Resp 16 | Wt 190.0 lb

## 2017-03-13 DIAGNOSIS — J189 Pneumonia, unspecified organism: Secondary | ICD-10-CM

## 2017-03-13 DIAGNOSIS — G3184 Mild cognitive impairment, so stated: Secondary | ICD-10-CM

## 2017-03-13 DIAGNOSIS — Z992 Dependence on renal dialysis: Secondary | ICD-10-CM | POA: Diagnosis not present

## 2017-03-13 DIAGNOSIS — N186 End stage renal disease: Secondary | ICD-10-CM | POA: Diagnosis not present

## 2017-03-13 DIAGNOSIS — I700 Atherosclerosis of aorta: Secondary | ICD-10-CM | POA: Diagnosis not present

## 2017-03-13 DIAGNOSIS — I712 Thoracic aortic aneurysm, without rupture: Secondary | ICD-10-CM | POA: Diagnosis not present

## 2017-03-13 NOTE — Patient Instructions (Signed)
Wear support hose to decrease swelling in legs.

## 2017-03-13 NOTE — Progress Notes (Signed)
Patient: Frank Huang Male    DOB: 07/24/1932   83 y.o.   MRN: [id number] Visit Date: 03/13/2017  Today's Provider: Wilhemena Durie, MD   Chief Complaint  Patient presents with  . Pneumonia    follow up    Subjective:    HPI Patient comes in today for a 1 month follow up on pneumonia. Patient reports that he is feeling much better. He is also due for a repeat CXR.     No Known Allergies   Current Outpatient Prescriptions:  .  aspirin EC 81 MG tablet, Take 81 mg by mouth daily., Disp: , Rfl:  .  CYANOCOBALAMIN IJ, Inject 1 Dose as directed every 30 (thirty) days., Disp: , Rfl:  .  donepezil (ARICEPT) 10 MG tablet, TAKE 1 TABLET BY MOUTH AT  BEDTIME, Disp: 90 tablet, Rfl: 3 .  furosemide (LASIX) 80 MG tablet, Take 1 tablet (80 mg total) by mouth every Tuesday, Thursday, Saturday, and Sunday., Disp: 30 tablet, Rfl: 0 .  ipratropium (ATROVENT) 0.03 % nasal spray, Place 2 sprays into both nostrils every 12 (twelve) hours., Disp: , Rfl:  .  lidocaine-prilocaine (EMLA) cream, , Disp: , Rfl:  .  lovastatin (MEVACOR) 40 MG tablet, TAKE 1 TABLET BY MOUTH EVERY DAY FOR HIGH CHOLESTEROL, Disp: , Rfl:  .  metoprolol tartrate (LOPRESSOR) 25 MG tablet, Take 0.5 tablets (12.5 mg total) by mouth 2 (two) times daily., Disp: 60 tablet, Rfl: 0 .  Multiple Vitamin (MULTI-VITAMINS) TABS, Take by mouth. Reported on 04/01/2016, Disp: , Rfl:  .  omeprazole (PRILOSEC) 20 MG capsule, TAKE 1 CAPSULE BY MOUTH  DAILY., Disp: 90 capsule, Rfl: 3 .  RENVELA 800 MG tablet, Take 800 mg by mouth 3 (three) times daily., Disp: , Rfl:  .  sucralfate (CARAFATE) 1 g tablet, Take 1 tablet (1 g total) by mouth 4 (four) times daily -  before meals and at bedtime., Disp: 120 tablet, Rfl: 11  Review of Systems  Constitutional: Negative for activity change, appetite change, chills, diaphoresis, fatigue and unexpected weight change.  HENT: Negative.   Eyes: Negative.   Respiratory: Negative for cough, shortness of  breath, wheezing and stridor.   Cardiovascular: Negative for chest pain, palpitations and leg swelling.  Gastrointestinal: Negative.   Endocrine: Negative.   Allergic/Immunologic: Negative.   Hematological: Negative.   Psychiatric/Behavioral: Negative.     Social History  Substance Use Topics  . Smoking status: Current Every Day Smoker    Packs/day: 0.50    Types: Cigarettes  . Smokeless tobacco: Never Used     Comment: smokes about 3 cigarettes per week  . Alcohol use No   Objective:   BP (!) 108/56 (BP Location: Right Arm, Patient Position: Sitting, Cuff Size: Normal)   Pulse 68   Temp 98.6 F (37 C)   Resp 16   Wt 190 lb (86.2 kg)   SpO2 99%   BMI 27.26 kg/m  Vitals:   03/13/17 1129  BP: (!) 108/56  Pulse: 68  Resp: 16  Temp: 98.6 F (37 C)  SpO2: 99%  Weight: 190 lb (86.2 kg)     Physical Exam  Constitutional: He is oriented to person, place, and time. He appears well-developed and well-nourished.  HENT:  Head: Normocephalic and atraumatic.  Right Ear: External ear normal.  Left Ear: External ear normal.  Nose: Nose normal.  Eyes: Conjunctivae are normal. No scleral icterus.  Neck: No thyromegaly present.  Cardiovascular: Normal  rate and regular rhythm.   Murmur heard. 3/6 systolic murmur.   Pulmonary/Chest: Effort normal and breath sounds normal.  Abdominal: Soft.  Musculoskeletal: He exhibits edema.  1=edema  Neurological: He is alert and oriented to person, place, and time.  Skin: Skin is warm and dry.        Assessment & Plan:     1. Pneumonia due to infectious organism, unspecified laterality, unspecified part of lung Improving. - DG Chest 2 View; Future 2.ESRD/on hemodialysis 3.MCI     I have done the exam and reviewed the above chart and it is accurate to the best of my knowledge. Development worker, community has been used in this note in any air is in the dictation or transcription are unintentional.  Wilhemena Durie, MD  [place]

## 2017-03-14 DIAGNOSIS — D689 Coagulation defect, unspecified: Secondary | ICD-10-CM | POA: Diagnosis not present

## 2017-03-14 DIAGNOSIS — N186 End stage renal disease: Secondary | ICD-10-CM | POA: Diagnosis not present

## 2017-03-14 DIAGNOSIS — D631 Anemia in chronic kidney disease: Secondary | ICD-10-CM | POA: Diagnosis not present

## 2017-03-14 DIAGNOSIS — N2581 Secondary hyperparathyroidism of renal origin: Secondary | ICD-10-CM | POA: Diagnosis not present

## 2017-03-17 DIAGNOSIS — N2581 Secondary hyperparathyroidism of renal origin: Secondary | ICD-10-CM | POA: Diagnosis not present

## 2017-03-17 DIAGNOSIS — N186 End stage renal disease: Secondary | ICD-10-CM | POA: Diagnosis not present

## 2017-03-17 DIAGNOSIS — D631 Anemia in chronic kidney disease: Secondary | ICD-10-CM | POA: Diagnosis not present

## 2017-03-17 DIAGNOSIS — D689 Coagulation defect, unspecified: Secondary | ICD-10-CM | POA: Diagnosis not present

## 2017-03-19 ENCOUNTER — Other Ambulatory Visit: Payer: Self-pay | Admitting: *Deleted

## 2017-03-19 ENCOUNTER — Telehealth: Payer: Self-pay | Admitting: *Deleted

## 2017-03-19 DIAGNOSIS — D689 Coagulation defect, unspecified: Secondary | ICD-10-CM | POA: Diagnosis not present

## 2017-03-19 DIAGNOSIS — D696 Thrombocytopenia, unspecified: Secondary | ICD-10-CM | POA: Insufficient documentation

## 2017-03-19 DIAGNOSIS — D631 Anemia in chronic kidney disease: Secondary | ICD-10-CM | POA: Diagnosis not present

## 2017-03-19 DIAGNOSIS — C9000 Multiple myeloma not having achieved remission: Secondary | ICD-10-CM

## 2017-03-19 DIAGNOSIS — N186 End stage renal disease: Secondary | ICD-10-CM | POA: Diagnosis not present

## 2017-03-19 DIAGNOSIS — N2581 Secondary hyperparathyroidism of renal origin: Secondary | ICD-10-CM | POA: Diagnosis not present

## 2017-03-19 NOTE — Telephone Encounter (Signed)
Spoke with wife about platelet results and need for lab repeat, voiced understanding.

## 2017-03-20 ENCOUNTER — Other Ambulatory Visit: Payer: Self-pay | Admitting: *Deleted

## 2017-03-21 ENCOUNTER — Telehealth: Payer: Self-pay | Admitting: *Deleted

## 2017-03-21 ENCOUNTER — Inpatient Hospital Stay: Payer: Medicare Other | Attending: Hematology and Oncology

## 2017-03-21 DIAGNOSIS — N2581 Secondary hyperparathyroidism of renal origin: Secondary | ICD-10-CM | POA: Diagnosis not present

## 2017-03-21 DIAGNOSIS — C9000 Multiple myeloma not having achieved remission: Secondary | ICD-10-CM | POA: Diagnosis not present

## 2017-03-21 DIAGNOSIS — N186 End stage renal disease: Secondary | ICD-10-CM | POA: Diagnosis not present

## 2017-03-21 DIAGNOSIS — D631 Anemia in chronic kidney disease: Secondary | ICD-10-CM | POA: Diagnosis not present

## 2017-03-21 DIAGNOSIS — D689 Coagulation defect, unspecified: Secondary | ICD-10-CM | POA: Diagnosis not present

## 2017-03-21 LAB — CBC WITH DIFFERENTIAL/PLATELET
Basophils Absolute: 0 10*3/uL (ref 0–0.1)
Basophils Relative: 1 %
Eosinophils Absolute: 0.3 10*3/uL (ref 0–0.7)
Eosinophils Relative: 7 %
HCT: 29.2 % — ABNORMAL LOW (ref 40.0–52.0)
Hemoglobin: 9.5 g/dL — ABNORMAL LOW (ref 13.0–18.0)
Lymphocytes Relative: 28 %
Lymphs Abs: 1 10*3/uL (ref 1.0–3.6)
MCH: 33 pg (ref 26.0–34.0)
MCHC: 32.7 g/dL (ref 32.0–36.0)
MCV: 101 fL — ABNORMAL HIGH (ref 80.0–100.0)
Monocytes Absolute: 0.2 10*3/uL (ref 0.2–1.0)
Monocytes Relative: 7 %
Neutro Abs: 2 10*3/uL (ref 1.4–6.5)
Neutrophils Relative %: 57 %
Platelets: 115 10*3/uL — ABNORMAL LOW (ref 150–440)
RBC: 2.89 MIL/uL — ABNORMAL LOW (ref 4.40–5.90)
RDW: 16.6 % — ABNORMAL HIGH (ref 11.5–14.5)
WBC: 3.6 10*3/uL — ABNORMAL LOW (ref 3.8–10.6)

## 2017-03-21 NOTE — Telephone Encounter (Signed)
-----   Message from Lequita Asal, MD sent at 03/21/2017 11:36 AM EDT ----- Regarding: Please call patient  CBC improved.  No intervention needed.  M  ----- Message ----- From: Interface, Lab In [place] Sent: 03/21/2017  11:31 AM To: Lequita Asal, MD

## 2017-03-21 NOTE — Telephone Encounter (Signed)
Called and spoke to spouse, Cosmo Tetreault to inform her that CBC is normal.  No intervention needed at this time.  Verbalized understanding.

## 2017-03-24 DIAGNOSIS — D631 Anemia in chronic kidney disease: Secondary | ICD-10-CM | POA: Diagnosis not present

## 2017-03-24 DIAGNOSIS — N186 End stage renal disease: Secondary | ICD-10-CM | POA: Diagnosis not present

## 2017-03-24 DIAGNOSIS — D689 Coagulation defect, unspecified: Secondary | ICD-10-CM | POA: Diagnosis not present

## 2017-03-24 DIAGNOSIS — N2581 Secondary hyperparathyroidism of renal origin: Secondary | ICD-10-CM | POA: Diagnosis not present

## 2017-03-26 DIAGNOSIS — N186 End stage renal disease: Secondary | ICD-10-CM | POA: Diagnosis not present

## 2017-03-26 DIAGNOSIS — D689 Coagulation defect, unspecified: Secondary | ICD-10-CM | POA: Diagnosis not present

## 2017-03-26 DIAGNOSIS — D631 Anemia in chronic kidney disease: Secondary | ICD-10-CM | POA: Diagnosis not present

## 2017-03-26 DIAGNOSIS — N2581 Secondary hyperparathyroidism of renal origin: Secondary | ICD-10-CM | POA: Diagnosis not present

## 2017-03-31 DIAGNOSIS — B351 Tinea unguium: Secondary | ICD-10-CM | POA: Diagnosis not present

## 2017-03-31 DIAGNOSIS — D689 Coagulation defect, unspecified: Secondary | ICD-10-CM | POA: Diagnosis not present

## 2017-03-31 DIAGNOSIS — I739 Peripheral vascular disease, unspecified: Secondary | ICD-10-CM | POA: Diagnosis not present

## 2017-03-31 DIAGNOSIS — M79674 Pain in right toe(s): Secondary | ICD-10-CM | POA: Diagnosis not present

## 2017-03-31 DIAGNOSIS — N186 End stage renal disease: Secondary | ICD-10-CM | POA: Diagnosis not present

## 2017-03-31 DIAGNOSIS — G9009 Other idiopathic peripheral autonomic neuropathy: Secondary | ICD-10-CM | POA: Diagnosis not present

## 2017-03-31 DIAGNOSIS — M79675 Pain in left toe(s): Secondary | ICD-10-CM | POA: Diagnosis not present

## 2017-03-31 DIAGNOSIS — D631 Anemia in chronic kidney disease: Secondary | ICD-10-CM | POA: Diagnosis not present

## 2017-03-31 DIAGNOSIS — N2581 Secondary hyperparathyroidism of renal origin: Secondary | ICD-10-CM | POA: Diagnosis not present

## 2017-04-02 DIAGNOSIS — N2581 Secondary hyperparathyroidism of renal origin: Secondary | ICD-10-CM | POA: Diagnosis not present

## 2017-04-02 DIAGNOSIS — D689 Coagulation defect, unspecified: Secondary | ICD-10-CM | POA: Diagnosis not present

## 2017-04-02 DIAGNOSIS — N186 End stage renal disease: Secondary | ICD-10-CM | POA: Diagnosis not present

## 2017-04-02 DIAGNOSIS — D631 Anemia in chronic kidney disease: Secondary | ICD-10-CM | POA: Diagnosis not present

## 2017-04-04 DIAGNOSIS — D631 Anemia in chronic kidney disease: Secondary | ICD-10-CM | POA: Diagnosis not present

## 2017-04-04 DIAGNOSIS — N2581 Secondary hyperparathyroidism of renal origin: Secondary | ICD-10-CM | POA: Diagnosis not present

## 2017-04-04 DIAGNOSIS — D689 Coagulation defect, unspecified: Secondary | ICD-10-CM | POA: Diagnosis not present

## 2017-04-04 DIAGNOSIS — N186 End stage renal disease: Secondary | ICD-10-CM | POA: Diagnosis not present

## 2017-04-07 DIAGNOSIS — N2581 Secondary hyperparathyroidism of renal origin: Secondary | ICD-10-CM | POA: Diagnosis not present

## 2017-04-07 DIAGNOSIS — D689 Coagulation defect, unspecified: Secondary | ICD-10-CM | POA: Diagnosis not present

## 2017-04-07 DIAGNOSIS — N186 End stage renal disease: Secondary | ICD-10-CM | POA: Diagnosis not present

## 2017-04-07 DIAGNOSIS — D631 Anemia in chronic kidney disease: Secondary | ICD-10-CM | POA: Diagnosis not present

## 2017-04-09 DIAGNOSIS — D689 Coagulation defect, unspecified: Secondary | ICD-10-CM | POA: Diagnosis not present

## 2017-04-09 DIAGNOSIS — N2581 Secondary hyperparathyroidism of renal origin: Secondary | ICD-10-CM | POA: Diagnosis not present

## 2017-04-09 DIAGNOSIS — D631 Anemia in chronic kidney disease: Secondary | ICD-10-CM | POA: Diagnosis not present

## 2017-04-09 DIAGNOSIS — N186 End stage renal disease: Secondary | ICD-10-CM | POA: Diagnosis not present

## 2017-04-10 DIAGNOSIS — N186 End stage renal disease: Secondary | ICD-10-CM | POA: Diagnosis not present

## 2017-04-10 DIAGNOSIS — Z993 Dependence on wheelchair: Secondary | ICD-10-CM | POA: Diagnosis not present

## 2017-04-10 DIAGNOSIS — I120 Hypertensive chronic kidney disease with stage 5 chronic kidney disease or end stage renal disease: Secondary | ICD-10-CM | POA: Diagnosis not present

## 2017-04-11 DIAGNOSIS — D689 Coagulation defect, unspecified: Secondary | ICD-10-CM | POA: Diagnosis not present

## 2017-04-11 DIAGNOSIS — N186 End stage renal disease: Secondary | ICD-10-CM | POA: Diagnosis not present

## 2017-04-11 DIAGNOSIS — N2581 Secondary hyperparathyroidism of renal origin: Secondary | ICD-10-CM | POA: Diagnosis not present

## 2017-04-11 DIAGNOSIS — D631 Anemia in chronic kidney disease: Secondary | ICD-10-CM | POA: Diagnosis not present

## 2017-04-14 DIAGNOSIS — N186 End stage renal disease: Secondary | ICD-10-CM | POA: Diagnosis not present

## 2017-04-14 DIAGNOSIS — D689 Coagulation defect, unspecified: Secondary | ICD-10-CM | POA: Diagnosis not present

## 2017-04-14 DIAGNOSIS — N2581 Secondary hyperparathyroidism of renal origin: Secondary | ICD-10-CM | POA: Diagnosis not present

## 2017-04-14 DIAGNOSIS — D631 Anemia in chronic kidney disease: Secondary | ICD-10-CM | POA: Diagnosis not present

## 2017-04-16 DIAGNOSIS — N186 End stage renal disease: Secondary | ICD-10-CM | POA: Diagnosis not present

## 2017-04-16 DIAGNOSIS — N2581 Secondary hyperparathyroidism of renal origin: Secondary | ICD-10-CM | POA: Diagnosis not present

## 2017-04-16 DIAGNOSIS — D689 Coagulation defect, unspecified: Secondary | ICD-10-CM | POA: Diagnosis not present

## 2017-04-16 DIAGNOSIS — D631 Anemia in chronic kidney disease: Secondary | ICD-10-CM | POA: Diagnosis not present

## 2017-04-18 DIAGNOSIS — N2581 Secondary hyperparathyroidism of renal origin: Secondary | ICD-10-CM | POA: Diagnosis not present

## 2017-04-18 DIAGNOSIS — D631 Anemia in chronic kidney disease: Secondary | ICD-10-CM | POA: Diagnosis not present

## 2017-04-18 DIAGNOSIS — N186 End stage renal disease: Secondary | ICD-10-CM | POA: Diagnosis not present

## 2017-04-18 DIAGNOSIS — D689 Coagulation defect, unspecified: Secondary | ICD-10-CM | POA: Diagnosis not present

## 2017-04-21 DIAGNOSIS — N186 End stage renal disease: Secondary | ICD-10-CM | POA: Diagnosis not present

## 2017-04-21 DIAGNOSIS — D631 Anemia in chronic kidney disease: Secondary | ICD-10-CM | POA: Diagnosis not present

## 2017-04-21 DIAGNOSIS — D689 Coagulation defect, unspecified: Secondary | ICD-10-CM | POA: Diagnosis not present

## 2017-04-21 DIAGNOSIS — N2581 Secondary hyperparathyroidism of renal origin: Secondary | ICD-10-CM | POA: Diagnosis not present

## 2017-04-23 DIAGNOSIS — D689 Coagulation defect, unspecified: Secondary | ICD-10-CM | POA: Diagnosis not present

## 2017-04-23 DIAGNOSIS — N186 End stage renal disease: Secondary | ICD-10-CM | POA: Diagnosis not present

## 2017-04-23 DIAGNOSIS — D631 Anemia in chronic kidney disease: Secondary | ICD-10-CM | POA: Diagnosis not present

## 2017-04-23 DIAGNOSIS — N2581 Secondary hyperparathyroidism of renal origin: Secondary | ICD-10-CM | POA: Diagnosis not present

## 2017-04-28 DIAGNOSIS — D689 Coagulation defect, unspecified: Secondary | ICD-10-CM | POA: Diagnosis not present

## 2017-04-28 DIAGNOSIS — N2581 Secondary hyperparathyroidism of renal origin: Secondary | ICD-10-CM | POA: Diagnosis not present

## 2017-04-28 DIAGNOSIS — D631 Anemia in chronic kidney disease: Secondary | ICD-10-CM | POA: Diagnosis not present

## 2017-04-28 DIAGNOSIS — N186 End stage renal disease: Secondary | ICD-10-CM | POA: Diagnosis not present

## 2017-04-30 DIAGNOSIS — N2581 Secondary hyperparathyroidism of renal origin: Secondary | ICD-10-CM | POA: Diagnosis not present

## 2017-04-30 DIAGNOSIS — D689 Coagulation defect, unspecified: Secondary | ICD-10-CM | POA: Diagnosis not present

## 2017-04-30 DIAGNOSIS — L820 Inflamed seborrheic keratosis: Secondary | ICD-10-CM | POA: Diagnosis not present

## 2017-04-30 DIAGNOSIS — N186 End stage renal disease: Secondary | ICD-10-CM | POA: Diagnosis not present

## 2017-04-30 DIAGNOSIS — D631 Anemia in chronic kidney disease: Secondary | ICD-10-CM | POA: Diagnosis not present

## 2017-05-02 DIAGNOSIS — N186 End stage renal disease: Secondary | ICD-10-CM | POA: Diagnosis not present

## 2017-05-02 DIAGNOSIS — N2581 Secondary hyperparathyroidism of renal origin: Secondary | ICD-10-CM | POA: Diagnosis not present

## 2017-05-02 DIAGNOSIS — D689 Coagulation defect, unspecified: Secondary | ICD-10-CM | POA: Diagnosis not present

## 2017-05-02 DIAGNOSIS — D631 Anemia in chronic kidney disease: Secondary | ICD-10-CM | POA: Diagnosis not present

## 2017-05-05 DIAGNOSIS — D631 Anemia in chronic kidney disease: Secondary | ICD-10-CM | POA: Diagnosis not present

## 2017-05-05 DIAGNOSIS — D689 Coagulation defect, unspecified: Secondary | ICD-10-CM | POA: Diagnosis not present

## 2017-05-05 DIAGNOSIS — N2581 Secondary hyperparathyroidism of renal origin: Secondary | ICD-10-CM | POA: Diagnosis not present

## 2017-05-05 DIAGNOSIS — N186 End stage renal disease: Secondary | ICD-10-CM | POA: Diagnosis not present

## 2017-05-07 DIAGNOSIS — D631 Anemia in chronic kidney disease: Secondary | ICD-10-CM | POA: Diagnosis not present

## 2017-05-07 DIAGNOSIS — D689 Coagulation defect, unspecified: Secondary | ICD-10-CM | POA: Diagnosis not present

## 2017-05-07 DIAGNOSIS — N2581 Secondary hyperparathyroidism of renal origin: Secondary | ICD-10-CM | POA: Diagnosis not present

## 2017-05-07 DIAGNOSIS — N186 End stage renal disease: Secondary | ICD-10-CM | POA: Diagnosis not present

## 2017-05-09 DIAGNOSIS — N186 End stage renal disease: Secondary | ICD-10-CM | POA: Diagnosis not present

## 2017-05-09 DIAGNOSIS — D689 Coagulation defect, unspecified: Secondary | ICD-10-CM | POA: Diagnosis not present

## 2017-05-09 DIAGNOSIS — D631 Anemia in chronic kidney disease: Secondary | ICD-10-CM | POA: Diagnosis not present

## 2017-05-09 DIAGNOSIS — N2581 Secondary hyperparathyroidism of renal origin: Secondary | ICD-10-CM | POA: Diagnosis not present

## 2017-05-10 DIAGNOSIS — I120 Hypertensive chronic kidney disease with stage 5 chronic kidney disease or end stage renal disease: Secondary | ICD-10-CM | POA: Diagnosis not present

## 2017-05-10 DIAGNOSIS — N186 End stage renal disease: Secondary | ICD-10-CM | POA: Diagnosis not present

## 2017-05-10 DIAGNOSIS — Z992 Dependence on renal dialysis: Secondary | ICD-10-CM | POA: Diagnosis not present

## 2017-05-12 DIAGNOSIS — D689 Coagulation defect, unspecified: Secondary | ICD-10-CM | POA: Diagnosis not present

## 2017-05-12 DIAGNOSIS — N186 End stage renal disease: Secondary | ICD-10-CM | POA: Diagnosis not present

## 2017-05-12 DIAGNOSIS — N2581 Secondary hyperparathyroidism of renal origin: Secondary | ICD-10-CM | POA: Diagnosis not present

## 2017-05-12 DIAGNOSIS — D631 Anemia in chronic kidney disease: Secondary | ICD-10-CM | POA: Diagnosis not present

## 2017-05-16 DIAGNOSIS — D689 Coagulation defect, unspecified: Secondary | ICD-10-CM | POA: Diagnosis not present

## 2017-05-16 DIAGNOSIS — N2581 Secondary hyperparathyroidism of renal origin: Secondary | ICD-10-CM | POA: Diagnosis not present

## 2017-05-16 DIAGNOSIS — D631 Anemia in chronic kidney disease: Secondary | ICD-10-CM | POA: Diagnosis not present

## 2017-05-16 DIAGNOSIS — N186 End stage renal disease: Secondary | ICD-10-CM | POA: Diagnosis not present

## 2017-05-19 DIAGNOSIS — N2581 Secondary hyperparathyroidism of renal origin: Secondary | ICD-10-CM | POA: Diagnosis not present

## 2017-05-19 DIAGNOSIS — D631 Anemia in chronic kidney disease: Secondary | ICD-10-CM | POA: Diagnosis not present

## 2017-05-19 DIAGNOSIS — N186 End stage renal disease: Secondary | ICD-10-CM | POA: Diagnosis not present

## 2017-05-19 DIAGNOSIS — D689 Coagulation defect, unspecified: Secondary | ICD-10-CM | POA: Diagnosis not present

## 2017-05-21 ENCOUNTER — Ambulatory Visit (INDEPENDENT_AMBULATORY_CARE_PROVIDER_SITE_OTHER): Payer: Medicare Other | Admitting: Family Medicine

## 2017-05-21 ENCOUNTER — Inpatient Hospital Stay: Payer: Medicare Other | Attending: Hematology and Oncology

## 2017-05-21 ENCOUNTER — Encounter: Payer: Self-pay | Admitting: Family Medicine

## 2017-05-21 VITALS — BP 108/52 | HR 72 | Temp 97.5°F | Resp 12 | Wt 189.0 lb

## 2017-05-21 DIAGNOSIS — D689 Coagulation defect, unspecified: Secondary | ICD-10-CM | POA: Diagnosis not present

## 2017-05-21 DIAGNOSIS — J449 Chronic obstructive pulmonary disease, unspecified: Secondary | ICD-10-CM | POA: Insufficient documentation

## 2017-05-21 DIAGNOSIS — D631 Anemia in chronic kidney disease: Secondary | ICD-10-CM | POA: Diagnosis not present

## 2017-05-21 DIAGNOSIS — Z79899 Other long term (current) drug therapy: Secondary | ICD-10-CM | POA: Diagnosis not present

## 2017-05-21 DIAGNOSIS — N186 End stage renal disease: Secondary | ICD-10-CM | POA: Diagnosis not present

## 2017-05-21 DIAGNOSIS — N2581 Secondary hyperparathyroidism of renal origin: Secondary | ICD-10-CM | POA: Diagnosis not present

## 2017-05-21 DIAGNOSIS — D696 Thrombocytopenia, unspecified: Secondary | ICD-10-CM | POA: Insufficient documentation

## 2017-05-21 DIAGNOSIS — Z9221 Personal history of antineoplastic chemotherapy: Secondary | ICD-10-CM | POA: Insufficient documentation

## 2017-05-21 DIAGNOSIS — M1990 Unspecified osteoarthritis, unspecified site: Secondary | ICD-10-CM | POA: Diagnosis not present

## 2017-05-21 DIAGNOSIS — Z716 Tobacco abuse counseling: Secondary | ICD-10-CM | POA: Diagnosis not present

## 2017-05-21 DIAGNOSIS — I5020 Unspecified systolic (congestive) heart failure: Secondary | ICD-10-CM | POA: Diagnosis not present

## 2017-05-21 DIAGNOSIS — G3184 Mild cognitive impairment, so stated: Secondary | ICD-10-CM

## 2017-05-21 DIAGNOSIS — Z8673 Personal history of transient ischemic attack (TIA), and cerebral infarction without residual deficits: Secondary | ICD-10-CM | POA: Diagnosis not present

## 2017-05-21 DIAGNOSIS — I2510 Atherosclerotic heart disease of native coronary artery without angina pectoris: Secondary | ICD-10-CM | POA: Diagnosis not present

## 2017-05-21 DIAGNOSIS — I252 Old myocardial infarction: Secondary | ICD-10-CM | POA: Diagnosis not present

## 2017-05-21 DIAGNOSIS — K219 Gastro-esophageal reflux disease without esophagitis: Secondary | ICD-10-CM | POA: Diagnosis not present

## 2017-05-21 DIAGNOSIS — Z7982 Long term (current) use of aspirin: Secondary | ICD-10-CM | POA: Insufficient documentation

## 2017-05-21 DIAGNOSIS — E538 Deficiency of other specified B group vitamins: Secondary | ICD-10-CM | POA: Insufficient documentation

## 2017-05-21 DIAGNOSIS — C9000 Multiple myeloma not having achieved remission: Secondary | ICD-10-CM | POA: Insufficient documentation

## 2017-05-21 DIAGNOSIS — F17210 Nicotine dependence, cigarettes, uncomplicated: Secondary | ICD-10-CM | POA: Diagnosis not present

## 2017-05-21 DIAGNOSIS — Z992 Dependence on renal dialysis: Secondary | ICD-10-CM | POA: Insufficient documentation

## 2017-05-21 DIAGNOSIS — J189 Pneumonia, unspecified organism: Secondary | ICD-10-CM | POA: Diagnosis not present

## 2017-05-21 DIAGNOSIS — I132 Hypertensive heart and chronic kidney disease with heart failure and with stage 5 chronic kidney disease, or end stage renal disease: Secondary | ICD-10-CM | POA: Insufficient documentation

## 2017-05-21 DIAGNOSIS — Z888 Allergy status to other drugs, medicaments and biological substances status: Secondary | ICD-10-CM | POA: Insufficient documentation

## 2017-05-21 LAB — CBC WITH DIFFERENTIAL/PLATELET
Basophils Absolute: 0.1 10*3/uL (ref 0–0.1)
Basophils Relative: 1 %
Eosinophils Absolute: 0.2 10*3/uL (ref 0–0.7)
Eosinophils Relative: 6 %
HCT: 37.8 % — ABNORMAL LOW (ref 40.0–52.0)
Hemoglobin: 12.4 g/dL — ABNORMAL LOW (ref 13.0–18.0)
Lymphocytes Relative: 22 %
Lymphs Abs: 0.9 10*3/uL — ABNORMAL LOW (ref 1.0–3.6)
MCH: 31.8 pg (ref 26.0–34.0)
MCHC: 32.7 g/dL (ref 32.0–36.0)
MCV: 97.1 fL (ref 80.0–100.0)
Monocytes Absolute: 0.3 10*3/uL (ref 0.2–1.0)
Monocytes Relative: 7 %
Neutro Abs: 2.5 10*3/uL (ref 1.4–6.5)
Neutrophils Relative %: 64 %
Platelets: 79 10*3/uL — ABNORMAL LOW (ref 150–440)
RBC: 3.89 MIL/uL — ABNORMAL LOW (ref 4.40–5.90)
RDW: 15.4 % — ABNORMAL HIGH (ref 11.5–14.5)
WBC: 4 10*3/uL (ref 3.8–10.6)

## 2017-05-21 LAB — COMPREHENSIVE METABOLIC PANEL
ALT: 12 U/L — ABNORMAL LOW (ref 17–63)
AST: 19 U/L (ref 15–41)
Albumin: 4.1 g/dL (ref 3.5–5.0)
Alkaline Phosphatase: 69 U/L (ref 38–126)
Anion gap: 12 (ref 5–15)
BUN: 20 mg/dL (ref 6–20)
CO2: 31 mmol/L (ref 22–32)
Calcium: 8.9 mg/dL (ref 8.9–10.3)
Chloride: 93 mmol/L — ABNORMAL LOW (ref 101–111)
Creatinine, Ser: 3.42 mg/dL — ABNORMAL HIGH (ref 0.61–1.24)
GFR calc Af Amer: 18 mL/min — ABNORMAL LOW (ref >60)
GFR calc non Af Amer: 15 mL/min — ABNORMAL LOW (ref >60)
Glucose, Bld: 97 mg/dL (ref 65–99)
Potassium: 3.7 mmol/L (ref 3.5–5.1)
Sodium: 136 mmol/L (ref 135–145)
Total Bilirubin: 1.1 mg/dL (ref 0.3–1.2)
Total Protein: 7.5 g/dL (ref 6.5–8.1)

## 2017-05-21 NOTE — Progress Notes (Signed)
Frank Huang  MRN: [id number] DOB: 1932-06-25  Subjective:  HPI  Patient is here for 2 months follow up. Last office visit was in may for follow up pneumonia. Repeat Chest xray -showed improvement. Patient states he is doing well. BP Readings from Last 3 Encounters:  05/21/17 (!) 108/52  03/13/17 (!) 108/56  02/19/17 (!) 100/59   Wt Readings from Last 3 Encounters:  05/21/17 189 lb (85.7 kg)  03/13/17 190 lb (86.2 kg)  02/19/17 190 lb 0.6 oz (86.2 kg)   Patient is going to Dialysis 3 times a week. Patient Active Problem List   Diagnosis Date Noted  . Thrombocytopenia ([place]) 03/19/2017  . Pneumonia 01/28/2017  . NSTEMI (non-ST elevated myocardial infarction) ([place]) 11/15/2016  . Chest pain, rule out acute myocardial infarction 11/14/2016  . Parotid mass 05/17/2016  . Nodule of right lung 05/11/2016  . Persistent atrial fibrillation ([place]) 02/05/2016  . B12 deficiency 07/25/2015  . Deficiency of vitamin B 07/25/2015  . Chest pain 07/01/2015  . Multiple myeloma ([place]) 04/12/2015  . Arteriosclerosis of coronary artery 04/09/2015  . CAFL (chronic airflow limitation) ([place]) 04/09/2015  . Chronic kidney disease requiring chronic dialysis ([place]) 04/09/2015  . Gastro-esophageal reflux disease without esophagitis 04/09/2015  . Gout 04/09/2015  . H/O acute myocardial infarction 04/09/2015  . HLD (hyperlipidemia) 04/09/2015  . BP (high blood pressure) 04/09/2015  . Bad memory 04/09/2015  . 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 Kidney failure 04/09/2015  . End-stage renal disease ([place]) 04/09/2015  . Chronic kidney disease (CKD), stage V ([place]) 07/20/2013  . Chronic kidney disease, stage V ([place]) 07/20/2013  . Absolute anemia 03/31/2013  . Neuropathy 03/31/2013    Past Medical History:  Diagnosis Date  . Cancer ([place])   . Chronic kidney disease   . COPD (chronic obstructive pulmonary disease) ([place])   . Coronary artery disease   . GERD  (gastroesophageal reflux disease)   . Multiple myeloma ([place])   . Myocardial infarction ([place]) 2005  . Neuropathy   . Shortness of breath dyspnea   . Stroke (cerebrum) (HCC)    weakness Lt hand  . Systolic CHF [facility])     Social History   Social History  . Marital status: Married    Spouse name: N/A  . Number of children: N/A  . Years of education: N/A   Occupational History  . Not on file.   Social History Main Topics  . Smoking status: Current Every Day Smoker    Packs/day: 0.50    Types: Cigarettes  . Smokeless tobacco: Never Used     Comment: smokes about 3 cigarettes per week  . Alcohol use No  . Drug use: No  . Sexual activity: Not on file   Other Topics Concern  . Not on file   Social History Narrative  . No narrative on file    Outpatient Encounter Prescriptions as of 05/21/2017  Medication Sig  . aspirin EC 81 MG tablet Take 81 mg by mouth daily.  Marland Kitchen donepezil (ARICEPT) 10 MG tablet TAKE 1 TABLET BY MOUTH AT  BEDTIME  . furosemide (LASIX) 80 MG tablet Take 1 tablet (80 mg total) by mouth every Tuesday, Thursday, Saturday, and Sunday.  . lidocaine-prilocaine (EMLA) cream   . lovastatin (MEVACOR) 40 MG tablet TAKE 1 TABLET BY MOUTH EVERY DAY FOR HIGH CHOLESTEROL  . metoprolol tartrate (LOPRESSOR) 25 MG tablet Take 0.5 tablets (12.5 mg total) by mouth 2 (two) times daily. (Patient  taking differently: Take 25 mg by mouth daily. )  . Multiple Vitamin (MULTI-VITAMINS) TABS Take by mouth. Reported on 04/01/2016  . NON FORMULARY Dialysis 3 times a week  . omeprazole (PRILOSEC) 20 MG capsule TAKE 1 CAPSULE BY MOUTH  DAILY.  Marland Kitchen RENVELA 800 MG tablet Take 800 mg by mouth 3 (three) times daily.  . sucralfate (CARAFATE) 1 g tablet Take 1 tablet (1 g total) by mouth 4 (four) times daily -  before meals and at bedtime.  Marland Kitchen ipratropium (ATROVENT) 0.03 % nasal spray Place 2 sprays into both nostrils every 12 (twelve) hours.  . [DISCONTINUED] CYANOCOBALAMIN IJ Inject 1 Dose as  directed every 30 (thirty) days.   No facility-administered encounter medications on file as of 05/21/2017.     No Known Allergies  Review of Systems  Constitutional: Positive for malaise/fatigue.  Eyes: Negative.   Respiratory: Negative.   Cardiovascular: Negative.   Gastrointestinal: Negative.   Musculoskeletal: Positive for back pain (chronic). Negative for falls, myalgias and neck pain.  Skin: Negative.   Endo/Heme/Allergies: Negative.   Psychiatric/Behavioral: Negative.     Objective:  BP (!) 108/52   Pulse 72   Temp (!) 97.5 F (36.4 C)   Resp 12   Wt 189 lb (85.7 kg)   BMI 27.12 kg/m   Physical Exam  Assessment and Plan :  1. Pneumonia due to infectious organism, unspecified laterality, unspecified part of lung Patient is doing well  2. Tobacco abuse counseling Patient states he is ready to quit. Declined medications to help.  3. Chronic kidney disease requiring chronic dialysis [facility]) Advised patient to go to dialysis regularly, and the importance of this.  4. MCI (mild cognitive impairment) with memory loss 5.CAD  HPI, Exam and A&P transcribed by Theressa Millard, RMA under direction and in the presence of Miguel Aschoff, MD. I have done the exam and reviewed the chart and it is accurate to the best of my knowledge. Development worker, community has been used and  any errors in dictation or transcription are unintentional. Miguel Aschoff M.D. [facility]

## 2017-05-22 DIAGNOSIS — I481 Persistent atrial fibrillation: Secondary | ICD-10-CM | POA: Diagnosis not present

## 2017-05-22 DIAGNOSIS — C9000 Multiple myeloma not having achieved remission: Secondary | ICD-10-CM | POA: Diagnosis not present

## 2017-05-22 DIAGNOSIS — K219 Gastro-esophageal reflux disease without esophagitis: Secondary | ICD-10-CM | POA: Diagnosis not present

## 2017-05-22 DIAGNOSIS — I2510 Atherosclerotic heart disease of native coronary artery without angina pectoris: Secondary | ICD-10-CM | POA: Diagnosis not present

## 2017-05-22 DIAGNOSIS — E7800 Pure hypercholesterolemia, unspecified: Secondary | ICD-10-CM | POA: Diagnosis not present

## 2017-05-22 LAB — PROTEIN ELECTROPHORESIS, SERUM
A/G Ratio: 1.4 (ref 0.7–1.7)
Albumin ELP: 4.1 g/dL (ref 2.9–4.4)
Alpha-1-Globulin: 0.3 g/dL (ref 0.0–0.4)
Alpha-2-Globulin: 0.7 g/dL (ref 0.4–1.0)
Beta Globulin: 1.7 g/dL — ABNORMAL HIGH (ref 0.7–1.3)
Gamma Globulin: 0.4 g/dL (ref 0.4–1.8)
Globulin, Total: 3 g/dL (ref 2.2–3.9)
M-Spike, %: 0.9 g/dL — ABNORMAL HIGH
Total Protein ELP: 7.1 g/dL (ref 6.0–8.5)

## 2017-05-22 LAB — KAPPA/LAMBDA LIGHT CHAINS
Kappa free light chain: 822.5 mg/L — ABNORMAL HIGH (ref 3.3–19.4)
Kappa, lambda light chain ratio: 31.27 — ABNORMAL HIGH (ref 0.26–1.65)
Lambda free light chains: 26.3 mg/L (ref 5.7–26.3)

## 2017-05-23 ENCOUNTER — Telehealth: Payer: Self-pay | Admitting: *Deleted

## 2017-05-23 DIAGNOSIS — D631 Anemia in chronic kidney disease: Secondary | ICD-10-CM | POA: Diagnosis not present

## 2017-05-23 DIAGNOSIS — D689 Coagulation defect, unspecified: Secondary | ICD-10-CM | POA: Diagnosis not present

## 2017-05-23 DIAGNOSIS — N2581 Secondary hyperparathyroidism of renal origin: Secondary | ICD-10-CM | POA: Diagnosis not present

## 2017-05-23 DIAGNOSIS — N186 End stage renal disease: Secondary | ICD-10-CM | POA: Diagnosis not present

## 2017-05-23 NOTE — Telephone Encounter (Signed)
Called and spoke to patient's spouse to inform her that MD would like to see patient in clinic next week.  A scheduler will be calling later today with the appointment.  She is in agreement with this plan.

## 2017-05-23 NOTE — Telephone Encounter (Signed)
-----   Message from Lequita Asal, MD sent at 05/22/2017  5:19 PM EDT ----- Regarding: Please call patient  Schedule appt for next week.  M  ----- Message ----- From: Interface, Lab In [place] Sent: 05/21/2017   1:39 PM To: Lequita Asal, MD

## 2017-05-26 DIAGNOSIS — D631 Anemia in chronic kidney disease: Secondary | ICD-10-CM | POA: Diagnosis not present

## 2017-05-26 DIAGNOSIS — N2581 Secondary hyperparathyroidism of renal origin: Secondary | ICD-10-CM | POA: Diagnosis not present

## 2017-05-26 DIAGNOSIS — D689 Coagulation defect, unspecified: Secondary | ICD-10-CM | POA: Diagnosis not present

## 2017-05-26 DIAGNOSIS — N186 End stage renal disease: Secondary | ICD-10-CM | POA: Diagnosis not present

## 2017-05-27 DIAGNOSIS — L820 Inflamed seborrheic keratosis: Secondary | ICD-10-CM | POA: Diagnosis not present

## 2017-05-28 ENCOUNTER — Other Ambulatory Visit: Payer: Self-pay | Admitting: *Deleted

## 2017-05-28 ENCOUNTER — Encounter: Payer: Self-pay | Admitting: Hematology and Oncology

## 2017-05-28 ENCOUNTER — Ambulatory Visit: Payer: Medicare Other | Admitting: Hematology and Oncology

## 2017-05-28 ENCOUNTER — Inpatient Hospital Stay: Payer: Medicare Other

## 2017-05-28 ENCOUNTER — Inpatient Hospital Stay (HOSPITAL_BASED_OUTPATIENT_CLINIC_OR_DEPARTMENT_OTHER): Payer: Medicare Other | Admitting: Hematology and Oncology

## 2017-05-28 VITALS — HR 73 | Temp 97.7°F | Resp 18 | Wt 193.6 lb

## 2017-05-28 DIAGNOSIS — Z8673 Personal history of transient ischemic attack (TIA), and cerebral infarction without residual deficits: Secondary | ICD-10-CM

## 2017-05-28 DIAGNOSIS — D631 Anemia in chronic kidney disease: Secondary | ICD-10-CM

## 2017-05-28 DIAGNOSIS — I252 Old myocardial infarction: Secondary | ICD-10-CM

## 2017-05-28 DIAGNOSIS — I2510 Atherosclerotic heart disease of native coronary artery without angina pectoris: Secondary | ICD-10-CM | POA: Diagnosis not present

## 2017-05-28 DIAGNOSIS — F17210 Nicotine dependence, cigarettes, uncomplicated: Secondary | ICD-10-CM

## 2017-05-28 DIAGNOSIS — E538 Deficiency of other specified B group vitamins: Secondary | ICD-10-CM

## 2017-05-28 DIAGNOSIS — N186 End stage renal disease: Secondary | ICD-10-CM

## 2017-05-28 DIAGNOSIS — Z79899 Other long term (current) drug therapy: Secondary | ICD-10-CM

## 2017-05-28 DIAGNOSIS — Z9221 Personal history of antineoplastic chemotherapy: Secondary | ICD-10-CM

## 2017-05-28 DIAGNOSIS — I132 Hypertensive heart and chronic kidney disease with heart failure and with stage 5 chronic kidney disease, or end stage renal disease: Secondary | ICD-10-CM | POA: Diagnosis not present

## 2017-05-28 DIAGNOSIS — I5020 Unspecified systolic (congestive) heart failure: Secondary | ICD-10-CM | POA: Diagnosis not present

## 2017-05-28 DIAGNOSIS — Z7982 Long term (current) use of aspirin: Secondary | ICD-10-CM

## 2017-05-28 DIAGNOSIS — K219 Gastro-esophageal reflux disease without esophagitis: Secondary | ICD-10-CM | POA: Diagnosis not present

## 2017-05-28 DIAGNOSIS — D696 Thrombocytopenia, unspecified: Secondary | ICD-10-CM

## 2017-05-28 DIAGNOSIS — C9000 Multiple myeloma not having achieved remission: Secondary | ICD-10-CM

## 2017-05-28 DIAGNOSIS — Z992 Dependence on renal dialysis: Secondary | ICD-10-CM | POA: Diagnosis not present

## 2017-05-28 DIAGNOSIS — Z888 Allergy status to other drugs, medicaments and biological substances status: Secondary | ICD-10-CM

## 2017-05-28 DIAGNOSIS — J449 Chronic obstructive pulmonary disease, unspecified: Secondary | ICD-10-CM

## 2017-05-28 DIAGNOSIS — M1990 Unspecified osteoarthritis, unspecified site: Secondary | ICD-10-CM

## 2017-05-28 LAB — VITAMIN B12: Vitamin B-12: 728 pg/mL (ref 180–914)

## 2017-05-28 LAB — FOLATE: Folate: 17.8 ng/mL (ref >5.9)

## 2017-05-28 NOTE — Progress Notes (Signed)
Patient offers no complaints today. 

## 2017-05-28 NOTE — Progress Notes (Signed)
[facility] day:  05/28/2017   Chief Complaint: Frank Huang is a 83 y.o. male with smoldering multiple myeloma who is seen for 3 month assessment.  HPI:  The patient was last seen in the medical oncology clinic on 02/19/2017.  At that time, he felt fine.  He continued dialysis 3 x/week. CBC revealed a hematocrit of 27.9, hemoglobin 9, MCV 101.1, platelets 74,000, white count 3800 with an ANC of 2600.  Creatinine was 3.0.  Calcium was 8.8, albumen 3.6, and protein 6.9.  SPEP revealed a 0.6 gm/dL monoclonal spike.  Free light chain ratio was 24.40.  CBC on 03/21/2017 revealed a platelet count of 115,000.  Labs on 05/21/2017 revealed a hematocrit 37.8, hemoglobin 12.4, MCV 97.1, platelets 79,000, WBC 4000 with an ANC of 2500. Creatinine was 3.4. Calcium was 8.9, albumin 4.1, and protein 7.5. SPEP revealed a 0.9 gm/dLmonoclonal spike. Free light chains ratio was 31.27.  Symptomatically, he states "I'm fine".  He denies any bone pain.  He denies any infections.   Past Medical History:  Diagnosis Date  . Cancer ([place])   . Chronic kidney disease   . COPD (chronic obstructive pulmonary disease) ([place])   . Coronary artery disease   . GERD (gastroesophageal reflux disease)   . Multiple myeloma ([place])   . Myocardial infarction ([place]) 2005  . Neuropathy   . Shortness of breath dyspnea   . Stroke (cerebrum) ([facility])    weakness Lt hand  . Systolic CHF [facility])     Past Surgical History:  Procedure Laterality Date  . APPENDECTOMY    . AV FISTULA PLACEMENT Left   . CARDIAC CATHETERIZATION N/A 11/15/2016   Procedure: Left Heart Cath and Coronary Angiography;  Surgeon: Isaias Cowman, MD;  Location: [place] [facility];  Service: Cardiovascular;  Laterality: N/A;  . CHOLECYSTECTOMY    . SHOULDER ARTHROSCOPY WITH OPEN ROTATOR CUFF REPAIR Left 09/19/2015   Procedure: SHOULDER ARTHROSCOPY , subacromial decompression, debridement;  Surgeon: Corky Mull, MD;   Location: [facility] ORS;  Service: Orthopedics;  Laterality: Left;  . TOTAL HIP ARTHROPLASTY Left     Family History  Problem Relation Age of Onset  . Alzheimer's disease Mother   . Kidney failure Father   . Bone cancer Sister   . Stomach cancer Sister     Social History:  reports that he has been smoking Cigarettes.  He has been smoking about 0.50 packs per day. He has never used smokeless tobacco. He reports that he does not drink alcohol or use drugs.  He began smoking at age 6. He smokes 20 packs per month (2 cartons). He has a 30-40 pack year smoking history.  The patient is accompanied by his wife, [name], today.  Allergies: No Known Allergies  Current Medications: Current Outpatient Prescriptions  Medication Sig Dispense Refill  . aspirin EC 81 MG tablet Take 81 mg by mouth daily.    Marland Kitchen donepezil (ARICEPT) 10 MG tablet TAKE 1 TABLET BY MOUTH AT  BEDTIME 90 tablet 3  . furosemide (LASIX) 80 MG tablet Take 1 tablet (80 mg total) by mouth every Tuesday, Thursday, Saturday, and Sunday. 30 tablet 0  . ipratropium (ATROVENT) 0.03 % nasal spray Place 2 sprays into both nostrils every 12 (twelve) hours.    . lidocaine-prilocaine (EMLA) cream     . lovastatin (MEVACOR) 40 MG tablet TAKE 1 TABLET BY MOUTH EVERY DAY FOR HIGH CHOLESTEROL    . metoprolol tartrate (LOPRESSOR) 25  MG tablet Take 0.5 tablets (12.5 mg total) by mouth 2 (two) times daily. (Patient taking differently: Take 25 mg by mouth daily. ) 60 tablet 0  . Multiple Vitamin (MULTI-VITAMINS) TABS Take by mouth. Reported on 04/01/2016    . NON FORMULARY Dialysis 3 times a week    . omeprazole (PRILOSEC) 20 MG capsule TAKE 1 CAPSULE BY MOUTH  DAILY. 90 capsule 3  . RENVELA 800 MG tablet Take 800 mg by mouth 3 (three) times daily.    . sucralfate (CARAFATE) 1 g tablet Take 1 tablet (1 g total) by mouth 4 (four) times daily -  before meals and at bedtime. 120 tablet 11   No current facility-administered medications for this visit.      Review of Systems:  GENERAL:  Feels "fine".  No fevers or sweats.  Weight up 3 pounds. PERFORMANCE STATUS (ECOG):  1 HEENT:  No visual changes, runny nose, sore throat, mouth sores or tenderness. Lungs: No shortness of breath or cough.  No hemoptysis. Cardiac:  No chest pain, palpitations, orthopnea, or PND. GI:  No nausea, vomiting, diarrhea, constipation, melena or hematochezia. GU:  Dialysis every MWF.  No urgency, frequency, dysuria, or hematuria. Musculoskeletal:  Left hip discomfort s/p replacement.  Back pain (chronic).  Right knee issues (see HPI).  No muscle tenderness. Extremities:  No pain or swelling. Skin:  Bruises easily on baby aspirin (no change).  No rashes or skin changes. Neuro:  Left foot numbness. No headache,weakness, balance or coordination issues. Endocrine:  No diabetes, thyroid issues, hot flashes or night sweats. Psych:  No mood changes, depression or anxiety. Pain:  No focal pain. Review of systems:  All other systems reviewed and found to be negative.  Physical Exam: Pulse 73, temperature 97.7 F (36.5 C), temperature source Tympanic, resp. rate 18, weight 193 lb 9 oz (87.8 kg). GENERAL:  Well developed, well nourished, gentleman sitting comfortably in the exam room in no acute distress. MENTAL STATUS:  Alert and oriented to person, place and time. HEAD:  Thin gray hair.  Male pattern baldness.  Normocephalic, atraumatic, face symmetric, no Cushingoid features. EYES:  Silver rimmed glasses.  Blue eyes.  Pupils equal round and reactive to light and accomodation.  No conjunctivitis or scleral icterus. ENT:  Oropharynx clear without lesion.  Tongue normal.  Dentures.  Mucous membranes moist.  RESPIRATORY:  Clear to auscultation without rales, wheezes or rhonchi. CARDIOVASCULAR:  Regular rate and rhythm without murmur, rub or gallop. ABDOMEN:  Soft, non-tender, with active bowel sounds, and no hepatosplenomegaly.  No masses. SKIN:  Multiple moles.  No  rashes, ulcers or lesions. EXTREMITIES: Chronic bilateral lower extremity edema.  No skin discoloration.  No palpable cords. LYMPH NODES:  No palpable cervical, supraclavicular, axillary or inguinal adenopathy  NEUROLOGICAL: Unremarkable. PSYCH:  Appropriate.   No visits with results within 3 Day(s) from this visit.  Latest known visit with results is:  Appointment on 05/21/2017  Component Date Value Ref Range Status  . WBC 05/21/2017 4.0  3.8 - 10.6 K/uL Final  . RBC 05/21/2017 3.89* 4.40 - 5.90 MIL/uL Final  . Hemoglobin 05/21/2017 12.4* 13.0 - 18.0 g/dL Final  . HCT 05/21/2017 37.8* 40.0 - 52.0 % Final  . MCV 05/21/2017 97.1  80.0 - 100.0 fL Final  . MCH 05/21/2017 31.8  26.0 - 34.0 pg Final  . MCHC 05/21/2017 32.7  32.0 - 36.0 g/dL Final  . RDW 05/21/2017 15.4* 11.5 - 14.5 % Final  . Platelets 05/21/2017 79*  150 - 440 K/uL Final  . Neutrophils Relative % 05/21/2017 64  % Final  . Neutro Abs 05/21/2017 2.5  1.4 - 6.5 K/uL Final  . Lymphocytes Relative 05/21/2017 22  % Final  . Lymphs Abs 05/21/2017 0.9* 1.0 - 3.6 K/uL Final  . Monocytes Relative 05/21/2017 7  % Final  . Monocytes Absolute 05/21/2017 0.3  0.2 - 1.0 K/uL Final  . Eosinophils Relative 05/21/2017 6  % Final  . Eosinophils Absolute 05/21/2017 0.2  0 - 0.7 K/uL Final  . Basophils Relative 05/21/2017 1  % Final  . Basophils Absolute 05/21/2017 0.1  0 - 0.1 K/uL Final  . Sodium 05/21/2017 136  135 - 145 mmol/L Final  . Potassium 05/21/2017 3.7  3.5 - 5.1 mmol/L Final  . Chloride 05/21/2017 93* 101 - 111 mmol/L Final  . CO2 05/21/2017 31  22 - 32 mmol/L Final  . Glucose, Bld 05/21/2017 97  65 - 99 mg/dL Final  . BUN 05/21/2017 20  6 - 20 mg/dL Final  . Creatinine, Ser 05/21/2017 3.42* 0.61 - 1.24 mg/dL Final  . Calcium 05/21/2017 8.9  8.9 - 10.3 mg/dL Final  . Total Protein 05/21/2017 7.5  6.5 - 8.1 g/dL Final  . Albumin 05/21/2017 4.1  3.5 - 5.0 g/dL Final  . AST 05/21/2017 19  15 - 41 U/L Final  . ALT 05/21/2017  12* 17 - 63 U/L Final  . Alkaline Phosphatase 05/21/2017 69  38 - 126 U/L Final  . Total Bilirubin 05/21/2017 1.1  0.3 - 1.2 mg/dL Final  . GFR calc non Af Amer 05/21/2017 15* >60 mL/min Final  . GFR calc Af Amer 05/21/2017 18* >60 mL/min Final   Comment: (NOTE) The eGFR has been calculated using the CKD EPI equation. This calculation has not been validated in all clinical situations. eGFR's persistently <60 mL/min signify possible Chronic Kidney Disease.   . Anion gap 05/21/2017 12  5 - 15 Final  . Total Protein ELP 05/21/2017 7.1  6.0 - 8.5 g/dL Final  . Albumin ELP 05/21/2017 4.1  2.9 - 4.4 g/dL Final  . Alpha-1-Globulin 05/21/2017 0.3  0.0 - 0.4 g/dL Final  . Alpha-2-Globulin 05/21/2017 0.7  0.4 - 1.0 g/dL Final  . Beta Globulin 05/21/2017 1.7* 0.7 - 1.3 g/dL Final  . Gamma Globulin 05/21/2017 0.4  0.4 - 1.8 g/dL Final  . M-Spike, % 05/21/2017 0.9* Not Observed g/dL Final   Comment: An additional m spike was observed at a concentration of 0.2 g/dl   . SPE Interp. 05/21/2017 Comment   Final   Comment: (NOTE) The SPE pattern demonstrates two peaks in the beta-gamma region which may represent monoclonal protein. A biclonal gammopathy may be confirmed by immunofixation, as well as evaluation of the urine for the presence of Bence-Jones protein. Performed At: [facility] [place], [place] [id number] Lindon Romp MD XH:[id number]   . Comment 05/21/2017 Comment   Final   Comment: (NOTE) Protein electrophoresis scan will follow via computer, mail, or courier delivery.   Marland Kitchen GLOBULIN, TOTAL 05/21/2017 3.0  2.2 - 3.9 g/dL Corrected  . A/G Ratio 05/21/2017 1.4  0.7 - 1.7 Corrected  . Kappa free light chain 05/21/2017 822.5* 3.3 - 19.4 mg/L Final  . Lamda free light chains 05/21/2017 26.3  5.7 - 26.3 mg/L Final  . Kappa, lamda light chain ratio 05/21/2017 31.27* 0.26 - 1.65 Final   Comment: (NOTE) Performed At: [facility] [address]  [place], [place] [id number]  Lindon Romp MD DT:[id number]     Assessment:  TAIT BALISTRERI is a 83 y.o. 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  Bone marrow revealed 20% plasma cells. He was treated with thalidomide for 7 months then discontinued in 06/2005 secondary to rash and depression. He was treated briefly with Revlimid in 2013 which was also discontinued secondary to rash.    M spike has been followed: 0.5 gm/dL on 06/15/2013, 0.8 gm/dL on 01/19/2014, 0.5 gm/dL on 11/22/2014, 0.9 on 06/28/2015, 0.7 on 09/20/2015, 0.6 on 01/02/2016, 0.8 on 04/16/2016, 0.6 on 08/21/2016, 0.6 on 11/20/2016, 0.6 on 02/19/2017, and 0.9 on 05/21/2017.  Kappa free light chains were 512 in 06/15/2013, 686 11/02/2013, 929 on 01/19/2014, 1212 in 05/10/2014, 1409 on 11/22/2014, 1119 on 02/28/2015, 851 (ratio 63.84) on 06/28/2015, 1006 (ratio 74.50) on 09/20/2015, 1133 (ratio 59.94) on 01/02/2016, 785.6 (ratio 24.32) on 04/16/2016, 834.9 (ratio 19.19) on 08/21/2016, 627 (ratio 24.4) on 02/19/2017, 822.5 (ratio 31.27) on 05/21/2017.  He has subsequently been followed off therapy.  He had a hip fracture in in 12/2013.  Per notes, pathology revealed no myeloma.  Bone survey on 01/17/2015 revealed a 9 mm lytic lesion in the frontoparietal region (previously 7 mm). Bone survey on 04/11/2016 revealed posterior parietal region of the skull more prominent.  There was diffuse osteopenia.  He has end-stage renal disease unrelated to his myeloma.  He has been on dialysis since 11/2013 (MWF).   He has anemia due to chronic renal insufficiency.  He has a history of iron deficiency. He had a negative colonoscopy and upper endoscopy in 08/2004. Notes indicate he also had a small bowel capsule study. EGD and colonoscopy in 2014 revealed gastric lesions which were cauterized. He receives IV iron and Procrit with dialysis.  Ferritin is elevated (? acute phase reactant as ESR  elevated).  Ferritin was 1121 on 07/05/2015 and 743 on 01/02/2016.  Iron saturation was 30% on 07/05/2015.  He has B12 deficiency.  B12 was 120 on 07/05/2015.  Folate was 10.8.  He was on B12 IM monthy (last on 08/02/2016 with primary care).  He is on oral B12.   He has fluctuating thrombocytopenia.  Platelet count was normal on 11/14/2016.  Platelet count has ranged between 74,000 and 115,000 in the last 4 months without trend.  He has a history of left shoulder discomfort.  MRI on 06/14/2015 revealed a tendinopathy, osteoarthritis, and capsulitis.   Abdomen and pelvic CT scan on 04/30/2016 revealed a 1.3 cm irregularly marginated nodular lesion in the anterior left lower lobe. There was a subtle area of slightly diminished attenuation near the tail of the pancreas of uncertain significance. There were multiple renal lesions (some did not represent simple cysts).   Chest CT on 05/03/2016 revealed a 1.05 cm ill-defined slightly spiculated irregular nodule in the left lower lobe as well as a 0.8 cm nodule in the right lower lobe.  PET scan on 05/16/2016 revealed no evidence of active skeletal multiple myeloma or plasmacytoma.  The LEFT lower lobe pulmonary nodule was 9 mm and had mild metabolic activity (SUV 1.5).  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  Chest CT on 08/20/2016 revealed interval resolution of bilateral lower lobe nodules c/w inflammatory or infectious process.  Ultrasound-guided biopsy of the right parotid nodule on 06/18/2016 was unsuccessful. He was seen by Dr. Jeannie Fend  Vaught on 06/24/2016.  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 He is followed by Dr. Pryor Ochoa.  He had a myocardial infarction on 11/14/2016.  He is being medically managed.  Symptomatically, he feels fine.  Exam is stable.  Platelet count has drifted down to 74,000.  SPEP is  stable.  Plan: 1.  Review labs from 05/21/2017. 2.  Additional labs today:  B12, folate, ANA with reflex, hepatitis B core antibody total, hepatitis B surface antigen, hepatitis C antibody. 3.  Bone survey. 4.  RTC in 6 weeks for MD assessment.  Addendum:  B12 was 728, folate 17.8, ANA positive, and negative hepatitis B core antibody, hepatitis B surface antigen and hepatitis C antibody.   Lequita Asal, MD  05/28/2017, 12:18 PM

## 2017-05-29 ENCOUNTER — Ambulatory Visit
Admission: RE | Admit: 2017-05-29 | Discharge: 2017-05-29 | Disposition: A | Discharge status: Home / Self Care | Payer: Medicare Other | Source: Ambulatory Visit | Attending: Hematology and Oncology | Admitting: Hematology and Oncology

## 2017-05-29 ENCOUNTER — Ambulatory Visit: Payer: Medicare Other | Admitting: Family Medicine

## 2017-05-29 DIAGNOSIS — C9000 Multiple myeloma not having achieved remission: Secondary | ICD-10-CM | POA: Diagnosis not present

## 2017-05-29 DIAGNOSIS — M1288 Other specific arthropathies, not elsewhere classified, other specified site: Secondary | ICD-10-CM | POA: Insufficient documentation

## 2017-05-29 DIAGNOSIS — I70209 Unspecified atherosclerosis of native arteries of extremities, unspecified extremity: Secondary | ICD-10-CM | POA: Diagnosis not present

## 2017-05-29 DIAGNOSIS — I6523 Occlusion and stenosis of bilateral carotid arteries: Secondary | ICD-10-CM | POA: Insufficient documentation

## 2017-05-29 DIAGNOSIS — M5134 Other intervertebral disc degeneration, thoracic region: Secondary | ICD-10-CM | POA: Insufficient documentation

## 2017-05-29 DIAGNOSIS — I700 Atherosclerosis of aorta: Secondary | ICD-10-CM | POA: Diagnosis not present

## 2017-05-29 DIAGNOSIS — M545 Low back pain: Secondary | ICD-10-CM | POA: Diagnosis not present

## 2017-05-29 DIAGNOSIS — I517 Cardiomegaly: Secondary | ICD-10-CM | POA: Diagnosis not present

## 2017-05-29 DIAGNOSIS — M5030 Other cervical disc degeneration, unspecified cervical region: Secondary | ICD-10-CM | POA: Insufficient documentation

## 2017-05-29 LAB — ENA+DNA/DS+SJORGEN'S
ENA SM Ab Ser-aCnc: <0.2 AI (ref 0.0–0.9)
Ribonucleic Protein: <0.2 AI (ref 0.0–0.9)
SSA (Ro) (ENA) Antibody, IgG: <0.2 AI (ref 0.0–0.9)
SSB (La) (ENA) Antibody, IgG: <0.2 AI (ref 0.0–0.9)
ds DNA Ab: <1 IU/mL (ref 0–9)

## 2017-05-29 LAB — HEPATITIS B SURFACE ANTIGEN: Hepatitis B Surface Ag: NEGATIVE

## 2017-05-29 LAB — ANA W/REFLEX: Anti Nuclear Antibody(ANA): POSITIVE — AB

## 2017-05-29 LAB — HEPATITIS B CORE ANTIBODY, TOTAL: Hep B Core Total Ab: NEGATIVE

## 2017-05-29 LAB — HEPATITIS C ANTIBODY: HCV Ab: <0.1 s/co ratio (ref 0.0–0.9)

## 2017-05-30 DIAGNOSIS — N186 End stage renal disease: Secondary | ICD-10-CM | POA: Diagnosis not present

## 2017-05-30 DIAGNOSIS — D689 Coagulation defect, unspecified: Secondary | ICD-10-CM | POA: Diagnosis not present

## 2017-05-30 DIAGNOSIS — D631 Anemia in chronic kidney disease: Secondary | ICD-10-CM | POA: Diagnosis not present

## 2017-05-30 DIAGNOSIS — N2581 Secondary hyperparathyroidism of renal origin: Secondary | ICD-10-CM | POA: Diagnosis not present

## 2017-06-02 DIAGNOSIS — N2581 Secondary hyperparathyroidism of renal origin: Secondary | ICD-10-CM | POA: Diagnosis not present

## 2017-06-02 DIAGNOSIS — D689 Coagulation defect, unspecified: Secondary | ICD-10-CM | POA: Diagnosis not present

## 2017-06-02 DIAGNOSIS — N186 End stage renal disease: Secondary | ICD-10-CM | POA: Diagnosis not present

## 2017-06-02 DIAGNOSIS — D631 Anemia in chronic kidney disease: Secondary | ICD-10-CM | POA: Diagnosis not present

## 2017-06-04 DIAGNOSIS — D689 Coagulation defect, unspecified: Secondary | ICD-10-CM | POA: Diagnosis not present

## 2017-06-04 DIAGNOSIS — D631 Anemia in chronic kidney disease: Secondary | ICD-10-CM | POA: Diagnosis not present

## 2017-06-04 DIAGNOSIS — N186 End stage renal disease: Secondary | ICD-10-CM | POA: Diagnosis not present

## 2017-06-04 DIAGNOSIS — N2581 Secondary hyperparathyroidism of renal origin: Secondary | ICD-10-CM | POA: Diagnosis not present

## 2017-06-09 DIAGNOSIS — D689 Coagulation defect, unspecified: Secondary | ICD-10-CM | POA: Diagnosis not present

## 2017-06-09 DIAGNOSIS — N2581 Secondary hyperparathyroidism of renal origin: Secondary | ICD-10-CM | POA: Diagnosis not present

## 2017-06-09 DIAGNOSIS — D631 Anemia in chronic kidney disease: Secondary | ICD-10-CM | POA: Diagnosis not present

## 2017-06-09 DIAGNOSIS — N186 End stage renal disease: Secondary | ICD-10-CM | POA: Diagnosis not present

## 2017-06-10 DIAGNOSIS — N186 End stage renal disease: Secondary | ICD-10-CM | POA: Diagnosis not present

## 2017-06-10 DIAGNOSIS — I120 Hypertensive chronic kidney disease with stage 5 chronic kidney disease or end stage renal disease: Secondary | ICD-10-CM | POA: Diagnosis not present

## 2017-06-10 DIAGNOSIS — Z992 Dependence on renal dialysis: Secondary | ICD-10-CM | POA: Diagnosis not present

## 2017-06-11 DIAGNOSIS — N186 End stage renal disease: Secondary | ICD-10-CM | POA: Diagnosis not present

## 2017-06-11 DIAGNOSIS — N2581 Secondary hyperparathyroidism of renal origin: Secondary | ICD-10-CM | POA: Diagnosis not present

## 2017-06-11 DIAGNOSIS — D689 Coagulation defect, unspecified: Secondary | ICD-10-CM | POA: Diagnosis not present

## 2017-06-11 DIAGNOSIS — D631 Anemia in chronic kidney disease: Secondary | ICD-10-CM | POA: Diagnosis not present

## 2017-06-11 LAB — BASIC METABOLIC PANEL WITH GFR
BUN: 39 — AB (ref 4–21)
Creatinine: 5.5 — AB (ref <=1.3)
Potassium: 5.4 — AB (ref 3.4–5.3)
Sodium: 139 (ref 137–147)

## 2017-06-13 DIAGNOSIS — N2581 Secondary hyperparathyroidism of renal origin: Secondary | ICD-10-CM | POA: Diagnosis not present

## 2017-06-13 DIAGNOSIS — D689 Coagulation defect, unspecified: Secondary | ICD-10-CM | POA: Diagnosis not present

## 2017-06-13 DIAGNOSIS — D631 Anemia in chronic kidney disease: Secondary | ICD-10-CM | POA: Diagnosis not present

## 2017-06-13 DIAGNOSIS — N186 End stage renal disease: Secondary | ICD-10-CM | POA: Diagnosis not present

## 2017-06-16 DIAGNOSIS — N186 End stage renal disease: Secondary | ICD-10-CM | POA: Diagnosis not present

## 2017-06-16 DIAGNOSIS — D631 Anemia in chronic kidney disease: Secondary | ICD-10-CM | POA: Diagnosis not present

## 2017-06-16 DIAGNOSIS — N2581 Secondary hyperparathyroidism of renal origin: Secondary | ICD-10-CM | POA: Diagnosis not present

## 2017-06-16 DIAGNOSIS — D689 Coagulation defect, unspecified: Secondary | ICD-10-CM | POA: Diagnosis not present

## 2017-06-18 DIAGNOSIS — N186 End stage renal disease: Secondary | ICD-10-CM | POA: Diagnosis not present

## 2017-06-18 DIAGNOSIS — D631 Anemia in chronic kidney disease: Secondary | ICD-10-CM | POA: Diagnosis not present

## 2017-06-18 DIAGNOSIS — D689 Coagulation defect, unspecified: Secondary | ICD-10-CM | POA: Diagnosis not present

## 2017-06-18 DIAGNOSIS — N2581 Secondary hyperparathyroidism of renal origin: Secondary | ICD-10-CM | POA: Diagnosis not present

## 2017-06-23 DIAGNOSIS — N2581 Secondary hyperparathyroidism of renal origin: Secondary | ICD-10-CM | POA: Diagnosis not present

## 2017-06-23 DIAGNOSIS — N186 End stage renal disease: Secondary | ICD-10-CM | POA: Diagnosis not present

## 2017-06-23 DIAGNOSIS — D689 Coagulation defect, unspecified: Secondary | ICD-10-CM | POA: Diagnosis not present

## 2017-06-23 DIAGNOSIS — D631 Anemia in chronic kidney disease: Secondary | ICD-10-CM | POA: Diagnosis not present

## 2017-06-25 DIAGNOSIS — N186 End stage renal disease: Secondary | ICD-10-CM | POA: Diagnosis not present

## 2017-06-25 DIAGNOSIS — D631 Anemia in chronic kidney disease: Secondary | ICD-10-CM | POA: Diagnosis not present

## 2017-06-25 DIAGNOSIS — N2581 Secondary hyperparathyroidism of renal origin: Secondary | ICD-10-CM | POA: Diagnosis not present

## 2017-06-25 DIAGNOSIS — D689 Coagulation defect, unspecified: Secondary | ICD-10-CM | POA: Diagnosis not present

## 2017-06-27 DIAGNOSIS — D689 Coagulation defect, unspecified: Secondary | ICD-10-CM | POA: Diagnosis not present

## 2017-06-27 DIAGNOSIS — N2581 Secondary hyperparathyroidism of renal origin: Secondary | ICD-10-CM | POA: Diagnosis not present

## 2017-06-27 DIAGNOSIS — D631 Anemia in chronic kidney disease: Secondary | ICD-10-CM | POA: Diagnosis not present

## 2017-06-27 DIAGNOSIS — N186 End stage renal disease: Secondary | ICD-10-CM | POA: Diagnosis not present

## 2017-06-27 DIAGNOSIS — L820 Inflamed seborrheic keratosis: Secondary | ICD-10-CM | POA: Diagnosis not present

## 2017-06-30 ENCOUNTER — Ambulatory Visit: Payer: Medicare Other

## 2017-06-30 ENCOUNTER — Ambulatory Visit
Admission: RE | Admit: 2017-06-30 | Discharge: 2017-06-30 | Disposition: A | Discharge status: Home / Self Care | Payer: Medicare Other | Source: Ambulatory Visit | Attending: Otolaryngology | Admitting: Otolaryngology

## 2017-06-30 DIAGNOSIS — K118 Other diseases of salivary glands: Secondary | ICD-10-CM

## 2017-06-30 DIAGNOSIS — R221 Localized swelling, mass and lump, neck: Secondary | ICD-10-CM | POA: Diagnosis not present

## 2017-06-30 DIAGNOSIS — N186 End stage renal disease: Secondary | ICD-10-CM | POA: Diagnosis not present

## 2017-06-30 DIAGNOSIS — D689 Coagulation defect, unspecified: Secondary | ICD-10-CM | POA: Diagnosis not present

## 2017-06-30 DIAGNOSIS — N2581 Secondary hyperparathyroidism of renal origin: Secondary | ICD-10-CM | POA: Diagnosis not present

## 2017-06-30 DIAGNOSIS — D631 Anemia in chronic kidney disease: Secondary | ICD-10-CM | POA: Diagnosis not present

## 2017-07-02 DIAGNOSIS — N186 End stage renal disease: Secondary | ICD-10-CM | POA: Diagnosis not present

## 2017-07-02 DIAGNOSIS — M79675 Pain in left toe(s): Secondary | ICD-10-CM | POA: Diagnosis not present

## 2017-07-02 DIAGNOSIS — M79674 Pain in right toe(s): Secondary | ICD-10-CM | POA: Diagnosis not present

## 2017-07-02 DIAGNOSIS — N2581 Secondary hyperparathyroidism of renal origin: Secondary | ICD-10-CM | POA: Diagnosis not present

## 2017-07-02 DIAGNOSIS — I739 Peripheral vascular disease, unspecified: Secondary | ICD-10-CM | POA: Diagnosis not present

## 2017-07-02 DIAGNOSIS — D631 Anemia in chronic kidney disease: Secondary | ICD-10-CM | POA: Diagnosis not present

## 2017-07-02 DIAGNOSIS — D689 Coagulation defect, unspecified: Secondary | ICD-10-CM | POA: Diagnosis not present

## 2017-07-02 DIAGNOSIS — L851 Acquired keratosis [keratoderma] palmaris et plantaris: Secondary | ICD-10-CM | POA: Diagnosis not present

## 2017-07-02 DIAGNOSIS — B351 Tinea unguium: Secondary | ICD-10-CM | POA: Diagnosis not present

## 2017-07-07 DIAGNOSIS — D631 Anemia in chronic kidney disease: Secondary | ICD-10-CM | POA: Diagnosis not present

## 2017-07-07 DIAGNOSIS — N186 End stage renal disease: Secondary | ICD-10-CM | POA: Diagnosis not present

## 2017-07-07 DIAGNOSIS — N2581 Secondary hyperparathyroidism of renal origin: Secondary | ICD-10-CM | POA: Diagnosis not present

## 2017-07-07 DIAGNOSIS — D689 Coagulation defect, unspecified: Secondary | ICD-10-CM | POA: Diagnosis not present

## 2017-07-08 DIAGNOSIS — D37030 Neoplasm of uncertain behavior of the parotid salivary glands: Secondary | ICD-10-CM | POA: Diagnosis not present

## 2017-07-08 DIAGNOSIS — H6123 Impacted cerumen, bilateral: Secondary | ICD-10-CM | POA: Diagnosis not present

## 2017-07-08 NOTE — Progress Notes (Signed)
[facility] day:  07/09/2017   Chief Complaint: Frank Huang is a 83 y.o. male with smoldering multiple myeloma who is seen for 6 week assessment.  HPI:  The patient was last seen in the medical oncology clinic on 05/28/2017.  At that time, he felt fine.  He continued dialysis 3 x/week.  CBC revealed a hematocrit of 37.8, hemoglobin 12.4, MCV 97.1, platelets 79,000, white count 4000 with an ANC of 2500.  Creatinine was 3.42 with a BUN of 20.  Calcium was 8.9, albumin 4.1, and protein 7.5.  SPEP revealed a 0.9 gm/dL monoclonal spike.  Free light chain ratio was 31.27.  Additional lab studies were ordered on 05/28/2017. B12 was 728, folate 17.8. Hepatitis B and C serologies were negative. ANA found to be positive, however anti-DNA antibody profile was negative.  Bone survey was done on 05/29/2017 that revealed no aggressive lytic or sclerotic osseous lesion of the axial and appendicular skeleton.   Soft tissue ultrasound of his head and neck on 06/30/2017 revealed a stable 1.6 cm right parotid mass. This is being followed by Dr. Pryor Huang; plans to see patient in follow up in a year.   Symptomatically,  he feels "fine".  He has no physical complaints today. He denies fevers, infections, or adenopathy. He has some areas of unexplained bruising. He has no petechial rash.  He denies any new medications or herbal products.   He has multiple scabbed/bleeding lesions on his scalp. He is being followed by dermatology and had several lesions "removed" on 07/04/2017.   Past Medical History:  Diagnosis Date  . Cancer ([place])   . Chronic kidney disease   . COPD (chronic obstructive pulmonary disease) ([place])   . Coronary artery disease   . GERD (gastroesophageal reflux disease)   . Multiple myeloma ([place])   . Myocardial infarction ([place]) 2005  . Neuropathy   . Shortness of breath dyspnea   . Stroke (cerebrum) ([facility])    weakness Lt hand  . Systolic CHF [facility])      Past Surgical History:  Procedure Laterality Date  . APPENDECTOMY    . AV FISTULA PLACEMENT Left   . CARDIAC CATHETERIZATION N/A 11/15/2016   Procedure: Left Heart Cath and Coronary Angiography;  Surgeon: Isaias Cowman, MD;  Location: [facility];  Service: Cardiovascular;  Laterality: N/A;  . CHOLECYSTECTOMY    . SHOULDER ARTHROSCOPY WITH OPEN ROTATOR CUFF REPAIR Left 09/19/2015   Procedure: SHOULDER ARTHROSCOPY , subacromial decompression, debridement;  Surgeon: Corky Mull, MD;  Location: [facility];  Service: Orthopedics;  Laterality: Left;  . TOTAL HIP ARTHROPLASTY Left     Family History  Problem Relation Age of Onset  . Alzheimer's disease Mother   . Kidney failure Father   . Bone cancer Sister   . Stomach cancer Sister     Social History:  reports that he has been smoking Cigarettes.  He has been smoking about 0.50 packs per day. He has never used smokeless tobacco. He reports that he does not drink alcohol or use drugs.  He began smoking at age 35. He smokes 20 packs per month (2 cartons). He has a 30-40 pack year smoking history.  The patient is accompanied by his wife, Frank Huang, today.  Allergies: No Known Allergies  Current Medications: Current Outpatient Prescriptions  Medication Sig Dispense Refill  . aspirin EC 81 MG tablet Take 81 mg by mouth daily.    Marland Kitchen donepezil (ARICEPT) 10 MG  tablet TAKE 1 TABLET BY MOUTH AT  BEDTIME 90 tablet 3  . furosemide (LASIX) 80 MG tablet Take 1 tablet (80 mg total) by mouth every Tuesday, Thursday, Saturday, and Sunday. 30 tablet 0  . ipratropium (ATROVENT) 0.03 % nasal spray Place 2 sprays into both nostrils every 12 (twelve) hours.    . lidocaine-prilocaine (EMLA) cream     . lovastatin (MEVACOR) 40 MG tablet TAKE 1 TABLET BY MOUTH EVERY DAY FOR HIGH CHOLESTEROL    . metoprolol tartrate (LOPRESSOR) 25 MG tablet Take 0.5 tablets (12.5 mg total) by mouth 2 (two) times daily. (Patient taking differently: Take 25 mg by mouth  daily. ) 60 tablet 0  . Multiple Vitamin (MULTI-VITAMINS) TABS Take by mouth. Reported on 04/01/2016    . NON FORMULARY Dialysis 3 times a week    . omeprazole (PRILOSEC) 20 MG capsule TAKE 1 CAPSULE BY MOUTH  DAILY. 90 capsule 3  . RENVELA 800 MG tablet Take 800 mg by mouth 3 (three) times daily.    . sucralfate (CARAFATE) 1 g tablet Take 1 tablet (1 g total) by mouth 4 (four) times daily -  before meals and at bedtime. 120 tablet 11   No current facility-administered medications for this visit.     Review of Systems:  GENERAL:  Feels "fine".  No fevers or sweats.  Weight down 5 pounds. PERFORMANCE STATUS (ECOG):  1 HEENT:  No visual changes, runny nose, sore throat, mouth sores or tenderness. Lungs: No shortness of breath or cough.  No hemoptysis. Cardiac:  No chest pain, palpitations, orthopnea, or PND. GI:  No nausea, vomiting, diarrhea, constipation, melena or hematochezia. GU:  Dialysis every MWF.  No urgency, frequency, dysuria, or hematuria. Musculoskeletal:  Left hip discomfort s/p replacement.  Back pain (chronic).  Right knee issues.  No muscle tenderness. Extremities:  No pain or swelling. Skin:  Scabbed lesions on head. Bruises easily on baby aspirin (no change).  No rashes or skin changes. Neuro:  Left foot numbness. No headache,weakness, balance or coordination issues. Endocrine:  No diabetes, thyroid issues, hot flashes or night sweats. Psych:  No mood changes, depression or anxiety. Pain:  No focal pain. Review of systems:  All other systems reviewed and found to be negative.  Physical Exam: Blood pressure 106/60, pulse 83, temperature (!) 96.4 F (35.8 C), temperature source Tympanic, resp. rate 18, weight 188 lb 0.8 oz (85.3 kg). GENERAL:  Well developed, well nourished, gentleman sitting comfortably in the exam room in no acute distress. MENTAL STATUS:  Alert and oriented to person, place and time. HEAD:  Thin gray hair.  Male pattern baldness.  Normocephalic,  atraumatic, face symmetric, no Cushingoid features. EYES:  Silver rimmed glasses.  Blue eyes.  Pupils equal round and reactive to light and accomodation.  No conjunctivitis or scleral icterus. ENT:  Oropharynx clear without lesion.  Tongue normal.  Dentures.  Mucous membranes moist.  RESPIRATORY:  Clear to auscultation without rales, wheezes or rhonchi. CARDIOVASCULAR:  Regular rate and rhythm without murmur, rub or gallop. ABDOMEN:  Soft, non-tender, with active bowel sounds, and no hepatosplenomegaly.  No masses. SKIN: Eschars on scalp s/p removal of skin lesions by dermatology.  Multiple moles.  No rashes, ulcers or lesions.  Blood on collar. EXTREMITIES: Chronic bilateral lower extremity edema.  No skin discoloration.  No palpable cords. LYMPH NODES:  No palpable cervical, supraclavicular, axillary or inguinal adenopathy  NEUROLOGICAL: Unremarkable. PSYCH:  Appropriate.   No visits with results within 3 Day(s) from  this visit.  Latest known visit with results is:  Clinical Support on 05/28/2017  Component Date Value Ref Range Status  . Vitamin B-12 05/28/2017 728  180 - 914 pg/mL Final   Comment: (NOTE) This assay is not validated for testing neonatal or myeloproliferative syndrome specimens for Vitamin B12 levels. Performed at [facility], [place] [address], [place], [place] [address]   . Folate 05/28/2017 17.8  >5.9 ng/mL Final  . Anit Nuclear Antibody(ANA) 05/28/2017 Positive* Negative Final   Comment: (NOTE) Performed At: [facility] [place], [place] [id number] Lindon Romp MD KZ:[id number]   . Hep B Core Total Ab 05/28/2017 Negative  Negative Final   Comment: (NOTE) Performed At: [facility] [place], [place] [id number] Lindon Romp MD RA:[id number]   . HCV Ab 05/28/2017 <0.1  0.0 - 0.9 s/co ratio Final   Comment: (NOTE)                                  Negative:     < 0.8                              Indeterminate: 0.8 - 0.9                                  Positive:     > 0.9 The CDC recommends that a positive HCV antibody result be followed up with a HCV Nucleic Acid Amplification test (456256). Performed At: [facility] [place], [place] [id number] Lindon Romp MD JG:[id number]   . Hepatitis B Surface Ag 05/28/2017 Negative  Negative Final   Comment: (NOTE) Performed At: [facility] [place], [place] [id number] Lindon Romp MD GT:[id number]   . ds DNA Ab 05/28/2017 <1  0 - 9 IU/mL Final   Comment: (NOTE)                                   Negative      <5                                   Equivocal  5 - 9                                   Positive      >9   . Ribonucleic Protein 05/28/2017 <0.2  0.0 - 0.9 AI Final  . ENA SM Ab Ser-aCnc 05/28/2017 <0.2  0.0 - 0.9 AI Final  . SSA (Ro) (ENA) Antibody, IgG 05/28/2017 <0.2  0.0 - 0.9 AI Final  . SSB (La) (ENA) Antibody, IgG 05/28/2017 <0.2  0.0 - 0.9 AI Final  . See below: 05/28/2017 Comment   Final   Comment: (NOTE) Autoantibody                       Disease Association ------------------------------------------------------------                        Condition  Frequency ---------------------   ------------------------   --------- Antinuclear Antibody,    SLE, mixed connective Direct (ANA-D)           tissue diseases ---------------------   ------------------------   --------- dsDNA                    SLE                        40 - 60% ---------------------   ------------------------   --------- Chromatin                Drug induced SLE                90%                         SLE                        48 - 97% ---------------------   ------------------------   --------- SSA (Ro)                 SLE                        25 - 35%                         Sjogren's Syndrome         40 - 70%                         Neonatal Lupus                  100% ---------------------   ------------------------   --------- SSB (La)                 SLE                                                       10%                         Sjogren's Syndrome              30% ---------------------   -----------------------    --------- Sm (anti-Smith)          SLE                        15 - 30% ---------------------   -----------------------    --------- RNP                      Mixed Connective Tissue                         Disease                         95% (U1 nRNP,                SLE  30 - 50% anti-ribonucleoprotein)  Polymyositis and/or                         Dermatomyositis                 20% ---------------------   ------------------------   --------- Scl-70 (antiDNA          Scleroderma (diffuse)      20 - 35% topoisomerase)           Crest                           13% ---------------------   ------------------------   --------- Jo-1                     Polymyositis and/or                         Dermatomyositis            20 - 40% ---------------------   ------------------------   --------- Centromere B             Scleroderma -                           Crest                         variant                         80% Performed At: [facility] [address] [place], [place] [id number] Lindon Romp MD PP:[id number]     Assessment:  Frank Huang is a 83 y.o. 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  Bone marrow revealed 20% plasma cells. He was treated with thalidomide for 7 months then discontinued in 06/2005 secondary to rash and depression. He was treated briefly with Revlimid in 2013 which was also discontinued secondary to rash.    M spike has been followed: 0.5 gm/dL on 06/15/2013, 0.8 gm/dL on 01/19/2014, 0.5 gm/dL on 11/22/2014, 0.9 on 06/28/2015, 0.7 on 09/20/2015, 0.6 on 01/02/2016, 0.8 on 04/16/2016, 0.6 on 08/21/2016, 0.6 on  11/20/2016, 0.6 on 02/19/2017, and 0.9 on 05/21/2017.  Kappa free light chains were 512 in 06/15/2013, 686 11/02/2013, 929 on 01/19/2014, 1212 in 05/10/2014, 1409 on 11/22/2014, 1119 on 02/28/2015, 851 (ratio 63.84) on 06/28/2015, 1006 (ratio 74.50) on 09/20/2015, 1133 (ratio 59.94) on 01/02/2016, 785.6 (ratio 24.32) on 04/16/2016, 834.9 (ratio 19.19) on 08/21/2016, 627 (ratio 24.4) on 02/19/2017, 822.5 (ratio 31.27) on 05/21/2017.  He has subsequently been followed off therapy.  He had a hip fracture in in 12/2013.  Per notes, pathology revealed no myeloma.  Bone survey on 01/17/2015 revealed a 9 mm lytic lesion in the frontoparietal region (previously 7 mm). Bone survey on 04/11/2016 revealed posterior parietal region of the skull more prominent.  There was diffuse osteopenia. Bone survey on 05/29/2017 revealed no aggressive lytic or sclerotic osseous lesion of the axial and appendicular skeleton.  He has end-stage renal disease unrelated to his myeloma.  He has been on dialysis since 11/2013 (MWF).   He has anemia due to chronic renal insufficiency.  He has a history of iron deficiency. He had a negative colonoscopy and upper endoscopy in 08/2004. Notes indicate he  also had a small bowel capsule study. EGD and colonoscopy in 2014 revealed gastric lesions which were cauterized. He receives IV iron and Procrit with dialysis.  Ferritin is elevated (? acute phase reactant as ESR elevated).  Ferritin was 1121 on 07/05/2015 and 743 on 01/02/2016.  Iron saturation was 30% on 07/05/2015.  He has B12 deficiency.  B12 was 120 on 07/05/2015.  Folate was 10.8.  He was on B12 IM monthy (last on 08/02/2016 with primary care).  He is on oral B12.   He has fluctuating thrombocytopenia.  Platelet count was normal on 11/14/2016.  Platelet count has ranged between 74,000 - 115,000 in the last 4 months without trend.  He has a history of left shoulder discomfort.  MRI on 06/14/2015 revealed a tendinopathy,  osteoarthritis, and capsulitis.   Abdomen and pelvic CT scan on 04/30/2016 revealed a 1.3 cm irregularly marginated nodular lesion in the anterior left lower lobe. There was a subtle area of slightly diminished attenuation near the tail of the pancreas of uncertain significance. There were multiple renal lesions (some did not represent simple cysts).   Chest CT on 05/03/2016 revealed a 1.05 cm ill-defined slightly spiculated irregular nodule in the left lower lobe as well as a 0.8 cm nodule in the right lower lobe.  PET scan on 05/16/2016 revealed no evidence of active skeletal multiple myeloma or plasmacytoma.  The LEFT lower lobe pulmonary nodule was 9 mm and had mild metabolic activity (SUV 1.5).  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  Chest CT on 08/20/2016 revealed interval resolution of bilateral lower lobe nodules c/w inflammatory or infectious process.  Ultrasound-guided biopsy of the right parotid nodule on 06/18/2016 was unsuccessful. He was seen by Dr. Carloyn Manner on 06/24/2016.  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 He is followed by Dr. Pryor Huang. Repeat soft tissue ultrasound on 06/30/2017 revealed a stable 1.6cm nodule.   He had a myocardial infarction on 11/14/2016.  He is being medically managed.  Symptomatically, he feels fine.  Exam is stable.  Hematocrit is 33.0, hemoglobin 11.0, and platelets 91,000 (improved).  Plan: 1.  Review labs from 05/28/2017.  Discuss negative work-up for thrombocytopenia.   2.  Given thrombocytopenia, we discussed obtaining an ultrasound to assess spleen sizes. If after imagining, spleen is not found to be enlarged, we will consider bone marrow aspirate and biopsy if platelet count drifts down without explanation.  Plan of care and need for future testing discussed at length with patient; reviewed potential risks  associated with the bone marrow biopsy.  3.  Abdominal ultrasound:  assess liver and spleen. 4.  RTC in 2 months for MD assessment and labs (CBC with diff, CMP, free light chains)    Honor Loh, NP  07/09/2017, 11:25 AM   I saw and evaluated the patient, participating in the key portions of the service and reviewing pertinent diagnostic studies and records.  I reviewed the nurse practitioner's note and agree with the findings and the plan.  The assessment and plan were discussed with the patient.  Additional diagnostic study of an abdominal ultrasound is needed to clarify liver and spleen anatomy and would change the clinical management.  A few questions were asked by the patient and answered.   Lequita Asal, MD 82/07/2017,5:27 PM

## 2017-07-09 ENCOUNTER — Other Ambulatory Visit: Payer: Medicare Other

## 2017-07-09 ENCOUNTER — Inpatient Hospital Stay: Payer: Medicare Other | Attending: Hematology and Oncology | Admitting: Hematology and Oncology

## 2017-07-09 ENCOUNTER — Ambulatory Visit: Payer: Medicare Other | Admitting: Hematology and Oncology

## 2017-07-09 ENCOUNTER — Inpatient Hospital Stay: Payer: Medicare Other

## 2017-07-09 VITALS — BP 106/60 | HR 83 | Temp 96.4°F | Resp 18 | Wt 188.1 lb

## 2017-07-09 DIAGNOSIS — M19011 Primary osteoarthritis, right shoulder: Secondary | ICD-10-CM | POA: Diagnosis not present

## 2017-07-09 DIAGNOSIS — N186 End stage renal disease: Secondary | ICD-10-CM | POA: Diagnosis not present

## 2017-07-09 DIAGNOSIS — I5020 Unspecified systolic (congestive) heart failure: Secondary | ICD-10-CM | POA: Insufficient documentation

## 2017-07-09 DIAGNOSIS — K219 Gastro-esophageal reflux disease without esophagitis: Secondary | ICD-10-CM | POA: Insufficient documentation

## 2017-07-09 DIAGNOSIS — E538 Deficiency of other specified B group vitamins: Secondary | ICD-10-CM | POA: Diagnosis not present

## 2017-07-09 DIAGNOSIS — D631 Anemia in chronic kidney disease: Secondary | ICD-10-CM | POA: Insufficient documentation

## 2017-07-09 DIAGNOSIS — Z800 Family history of malignant neoplasm of digestive organs: Secondary | ICD-10-CM | POA: Diagnosis not present

## 2017-07-09 DIAGNOSIS — I252 Old myocardial infarction: Secondary | ICD-10-CM | POA: Insufficient documentation

## 2017-07-09 DIAGNOSIS — Z992 Dependence on renal dialysis: Secondary | ICD-10-CM | POA: Insufficient documentation

## 2017-07-09 DIAGNOSIS — J449 Chronic obstructive pulmonary disease, unspecified: Secondary | ICD-10-CM | POA: Insufficient documentation

## 2017-07-09 DIAGNOSIS — D689 Coagulation defect, unspecified: Secondary | ICD-10-CM | POA: Diagnosis not present

## 2017-07-09 DIAGNOSIS — C9000 Multiple myeloma not having achieved remission: Secondary | ICD-10-CM

## 2017-07-09 DIAGNOSIS — G8929 Other chronic pain: Secondary | ICD-10-CM

## 2017-07-09 DIAGNOSIS — F17210 Nicotine dependence, cigarettes, uncomplicated: Secondary | ICD-10-CM | POA: Insufficient documentation

## 2017-07-09 DIAGNOSIS — I2510 Atherosclerotic heart disease of native coronary artery without angina pectoris: Secondary | ICD-10-CM | POA: Diagnosis not present

## 2017-07-09 DIAGNOSIS — D696 Thrombocytopenia, unspecified: Secondary | ICD-10-CM | POA: Insufficient documentation

## 2017-07-09 DIAGNOSIS — R634 Abnormal weight loss: Secondary | ICD-10-CM | POA: Diagnosis not present

## 2017-07-09 DIAGNOSIS — Z8673 Personal history of transient ischemic attack (TIA), and cerebral infarction without residual deficits: Secondary | ICD-10-CM | POA: Diagnosis not present

## 2017-07-09 DIAGNOSIS — Z888 Allergy status to other drugs, medicaments and biological substances status: Secondary | ICD-10-CM | POA: Diagnosis not present

## 2017-07-09 DIAGNOSIS — Z79899 Other long term (current) drug therapy: Secondary | ICD-10-CM | POA: Insufficient documentation

## 2017-07-09 DIAGNOSIS — Z7982 Long term (current) use of aspirin: Secondary | ICD-10-CM | POA: Insufficient documentation

## 2017-07-09 DIAGNOSIS — N2581 Secondary hyperparathyroidism of renal origin: Secondary | ICD-10-CM | POA: Diagnosis not present

## 2017-07-09 DIAGNOSIS — M549 Dorsalgia, unspecified: Secondary | ICD-10-CM | POA: Insufficient documentation

## 2017-07-09 LAB — CBC WITH DIFFERENTIAL/PLATELET
Basophils Absolute: 0 10*3/uL (ref 0–0.1)
Basophils Relative: 1 %
Eosinophils Absolute: 0.3 10*3/uL (ref 0–0.7)
Eosinophils Relative: 8 %
HCT: 33 % — ABNORMAL LOW (ref 40.0–52.0)
Hemoglobin: 11 g/dL — ABNORMAL LOW (ref 13.0–18.0)
Lymphocytes Relative: 27 %
Lymphs Abs: 1 10*3/uL (ref 1.0–3.6)
MCH: 32.1 pg (ref 26.0–34.0)
MCHC: 33.5 g/dL (ref 32.0–36.0)
MCV: 95.8 fL (ref 80.0–100.0)
Monocytes Absolute: 0.2 10*3/uL (ref 0.2–1.0)
Monocytes Relative: 6 %
Neutro Abs: 2.1 10*3/uL (ref 1.4–6.5)
Neutrophils Relative %: 58 %
Platelets: 91 10*3/uL — ABNORMAL LOW (ref 150–440)
RBC: 3.44 MIL/uL — ABNORMAL LOW (ref 4.40–5.90)
RDW: 16.6 % — ABNORMAL HIGH (ref 11.5–14.5)
WBC: 3.5 10*3/uL — ABNORMAL LOW (ref 3.8–10.6)

## 2017-07-09 NOTE — Progress Notes (Signed)
Patient offers no complaints. 

## 2017-07-10 ENCOUNTER — Encounter: Payer: Self-pay | Admitting: Hematology and Oncology

## 2017-07-10 LAB — PROTEIN ELECTROPHORESIS, SERUM
A/G Ratio: 1.5 (ref 0.7–1.7)
Albumin ELP: 4.4 g/dL (ref 2.9–4.4)
Alpha-1-Globulin: 0.3 g/dL (ref 0.0–0.4)
Alpha-2-Globulin: 0.6 g/dL (ref 0.4–1.0)
Beta Globulin: 1.7 g/dL — ABNORMAL HIGH (ref 0.7–1.3)
Gamma Globulin: 0.4 g/dL (ref 0.4–1.8)
Globulin, Total: 3 g/dL (ref 2.2–3.9)
M-Spike, %: 0.7 g/dL — ABNORMAL HIGH
Total Protein ELP: 7.4 g/dL (ref 6.0–8.5)

## 2017-07-11 ENCOUNTER — Other Ambulatory Visit: Payer: Self-pay | Admitting: Urgent Care

## 2017-07-11 DIAGNOSIS — D696 Thrombocytopenia, unspecified: Secondary | ICD-10-CM

## 2017-07-11 DIAGNOSIS — C9000 Multiple myeloma not having achieved remission: Secondary | ICD-10-CM

## 2017-07-11 DIAGNOSIS — Z992 Dependence on renal dialysis: Secondary | ICD-10-CM | POA: Diagnosis not present

## 2017-07-11 DIAGNOSIS — N186 End stage renal disease: Secondary | ICD-10-CM | POA: Diagnosis not present

## 2017-07-11 DIAGNOSIS — D689 Coagulation defect, unspecified: Secondary | ICD-10-CM | POA: Diagnosis not present

## 2017-07-11 DIAGNOSIS — N2581 Secondary hyperparathyroidism of renal origin: Secondary | ICD-10-CM | POA: Diagnosis not present

## 2017-07-11 DIAGNOSIS — D631 Anemia in chronic kidney disease: Secondary | ICD-10-CM | POA: Diagnosis not present

## 2017-07-11 DIAGNOSIS — I120 Hypertensive chronic kidney disease with stage 5 chronic kidney disease or end stage renal disease: Secondary | ICD-10-CM | POA: Diagnosis not present

## 2017-07-14 ENCOUNTER — Other Ambulatory Visit: Payer: Self-pay

## 2017-07-14 ENCOUNTER — Encounter: Payer: Self-pay | Admitting: Intensive Care

## 2017-07-14 ENCOUNTER — Emergency Department
Admission: EM | Admit: 2017-07-14 | Discharge: 2017-07-14 | Disposition: A | Discharge status: Home / Self Care | Payer: Medicare Other | Attending: Emergency Medicine | Admitting: Emergency Medicine

## 2017-07-14 ENCOUNTER — Emergency Department: Payer: Medicare Other

## 2017-07-14 DIAGNOSIS — I25810 Atherosclerosis of coronary artery bypass graft(s) without angina pectoris: Secondary | ICD-10-CM | POA: Insufficient documentation

## 2017-07-14 DIAGNOSIS — D631 Anemia in chronic kidney disease: Secondary | ICD-10-CM | POA: Diagnosis not present

## 2017-07-14 DIAGNOSIS — D689 Coagulation defect, unspecified: Secondary | ICD-10-CM | POA: Diagnosis not present

## 2017-07-14 DIAGNOSIS — Z992 Dependence on renal dialysis: Secondary | ICD-10-CM | POA: Diagnosis not present

## 2017-07-14 DIAGNOSIS — N186 End stage renal disease: Secondary | ICD-10-CM | POA: Insufficient documentation

## 2017-07-14 DIAGNOSIS — D509 Iron deficiency anemia, unspecified: Secondary | ICD-10-CM | POA: Diagnosis not present

## 2017-07-14 DIAGNOSIS — Z7982 Long term (current) use of aspirin: Secondary | ICD-10-CM | POA: Insufficient documentation

## 2017-07-14 DIAGNOSIS — R079 Chest pain, unspecified: Secondary | ICD-10-CM

## 2017-07-14 DIAGNOSIS — I5020 Unspecified systolic (congestive) heart failure: Secondary | ICD-10-CM | POA: Insufficient documentation

## 2017-07-14 DIAGNOSIS — F17210 Nicotine dependence, cigarettes, uncomplicated: Secondary | ICD-10-CM | POA: Insufficient documentation

## 2017-07-14 DIAGNOSIS — I252 Old myocardial infarction: Secondary | ICD-10-CM | POA: Diagnosis not present

## 2017-07-14 DIAGNOSIS — Z8673 Personal history of transient ischemic attack (TIA), and cerebral infarction without residual deficits: Secondary | ICD-10-CM | POA: Insufficient documentation

## 2017-07-14 DIAGNOSIS — N2581 Secondary hyperparathyroidism of renal origin: Secondary | ICD-10-CM | POA: Diagnosis not present

## 2017-07-14 DIAGNOSIS — R0789 Other chest pain: Secondary | ICD-10-CM | POA: Diagnosis not present

## 2017-07-14 DIAGNOSIS — I132 Hypertensive heart and chronic kidney disease with heart failure and with stage 5 chronic kidney disease, or end stage renal disease: Secondary | ICD-10-CM | POA: Diagnosis not present

## 2017-07-14 DIAGNOSIS — Z23 Encounter for immunization: Secondary | ICD-10-CM | POA: Diagnosis not present

## 2017-07-14 LAB — CBC
HEMATOCRIT: 31.9 % — AB (ref 40.0–52.0)
HEMOGLOBIN: 10.7 g/dL — AB (ref 13.0–18.0)
MCH: 32.3 pg (ref 26.0–34.0)
MCHC: 33.7 g/dL (ref 32.0–36.0)
MCV: 95.8 fL (ref 80.0–100.0)
Platelets: 99 10*3/uL — ABNORMAL LOW (ref 150–440)
RBC: 3.33 MIL/uL — ABNORMAL LOW (ref 4.40–5.90)
RDW: 16.9 % — AB (ref 11.5–14.5)
WBC: 3.2 10*3/uL — ABNORMAL LOW (ref 3.8–10.6)

## 2017-07-14 LAB — BASIC METABOLIC PANEL
ANION GAP: 12 (ref 5–15)
BUN: 24 mg/dL — AB (ref 6–20)
CALCIUM: 8.5 mg/dL — AB (ref 8.9–10.3)
CO2: 32 mmol/L (ref 22–32)
Chloride: 94 mmol/L — ABNORMAL LOW (ref 101–111)
Creatinine, Ser: 2.99 mg/dL — ABNORMAL HIGH (ref 0.61–1.24)
GFR calc Af Amer: 21 mL/min — ABNORMAL LOW (ref >60)
GFR, EST NON AFRICAN AMERICAN: 18 mL/min — AB (ref >60)
GLUCOSE: 107 mg/dL — AB (ref 65–99)
Potassium: 3.2 mmol/L — ABNORMAL LOW (ref 3.5–5.1)
Sodium: 138 mmol/L (ref 135–145)

## 2017-07-14 LAB — TROPONIN I: TROPONIN I: 0.03 ng/mL — AB (ref <0.03)

## 2017-07-14 NOTE — ED Notes (Signed)
Dr. Reita Cliche notified in person about troponin 0.03

## 2017-07-14 NOTE — ED Notes (Signed)
Post de-acess fistula has positive bruie and thrill.

## 2017-07-14 NOTE — Discharge Instructions (Signed)
You were evaluated for chest discomfort near the end of dialysis, and although no certain cause was found, your exam and evaluation are overall reassuring in the emergency department today.  Return to the emergency room immediately for any worsening symptoms including chest pain, trouble breathing, sweats, nausea, or any other symptoms concerning to you.

## 2017-07-14 NOTE — ED Triage Notes (Signed)
Pt was receiving dialysis at [facility] with about 18 min left when he started experiencing L chest pain that radiated to his L shoulder/arm. A&O X4 upon arrival to ER. A-fib with EMS, given 324 aspirin. EMS vitals 116/62, 96% RA, 130 blood sugar

## 2017-07-14 NOTE — ED Provider Notes (Signed)
[facility] Emergency Department Provider Note ____________________________________________   I have reviewed the triage vital signs and the triage nursing note.  HISTORY  Chief Complaint Chest Pain   Historian Patient and wife  HPI Frank Huang is a 83 y.o. male with a history of atrial fibrillation, MI, stroke, chronic kidney disease on dialysis, COPD, presents due to a nonspecific chest discomfort with nausea that occurred near the end of his dialysis. Patient describes he was laying down with his dialysis and then started to feel nauseated so he sat up and he had a twinge of left-sided chest discomfort. When he told the nurse about this he was brought to the emergency room for further evaluation. Patient states he feels fine now. Does not report any recent respiratory symptoms.  Patient is a bit of a poor historian does not know if he's had any cardiac history. After review of his chart history, there is a note from the hospital from Dr. Nehemiah Massed that the patient does have a history of atrial fibrillation although it is not listed on his electronic medical record that is carrying forward.    Past Medical History:  Diagnosis Date  . Cancer ([place])   . Chronic kidney disease   . COPD (chronic obstructive pulmonary disease) ([place])   . Coronary artery disease   . GERD (gastroesophageal reflux disease)   . Multiple myeloma ([place])   . Myocardial infarction ([place]) 2005  . Neuropathy   . Shortness of breath dyspnea   . Stroke (cerebrum) ([facility])    weakness Lt hand  . Systolic CHF [facility])     Patient Active Problem List   Diagnosis Date Noted  . Thrombocytopenia ([place]) 03/19/2017  . Pneumonia 01/28/2017  . NSTEMI (non-ST elevated myocardial infarction) ([place]) 11/15/2016  . Chest pain, rule out acute myocardial infarction 11/14/2016  . Parotid mass 05/17/2016  . Nodule of right lung 05/11/2016  . Persistent atrial fibrillation ([place]) 02/05/2016  . B12 deficiency  07/25/2015  . Deficiency of vitamin B 07/25/2015  . Chest pain 07/01/2015  . Multiple myeloma ([place]) 04/12/2015  . Arteriosclerosis of coronary artery 04/09/2015  . CAFL (chronic airflow limitation) ([place]) 04/09/2015  . Chronic kidney disease requiring chronic dialysis ([place]) 04/09/2015  . Gastro-esophageal reflux disease without esophagitis 04/09/2015  . Gout 04/09/2015  . H/O acute myocardial infarction 04/09/2015  . HLD (hyperlipidemia) 04/09/2015  . BP (high blood pressure) 04/09/2015  . Bad memory 04/09/2015  . 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 Kidney failure 04/09/2015  . End-stage renal disease ([place]) 04/09/2015  . Chronic kidney disease (CKD), stage V ([place]) 07/20/2013  . Chronic kidney disease, stage V ([place]) 07/20/2013  . Absolute anemia 03/31/2013  . Neuropathy 03/31/2013    Past Surgical History:  Procedure Laterality Date  . APPENDECTOMY    . AV FISTULA PLACEMENT Left   . CARDIAC CATHETERIZATION N/A 11/15/2016   Procedure: Left Heart Cath and Coronary Angiography;  Surgeon: Isaias Cowman, MD;  Location: [place] [facility];  Service: Cardiovascular;  Laterality: N/A;  . CHOLECYSTECTOMY    . SHOULDER ARTHROSCOPY WITH OPEN ROTATOR CUFF REPAIR Left 09/19/2015   Procedure: SHOULDER ARTHROSCOPY , subacromial decompression, debridement;  Surgeon: Corky Mull, MD;  Location: [facility] ORS;  Service: Orthopedics;  Laterality: Left;  . TOTAL HIP ARTHROPLASTY Left     Prior to Admission medications   Medication Sig Start Date End Date Taking? Authorizing Provider  aspirin EC 81 MG tablet Take 81 mg  by mouth daily.   Yes [provider]  donepezil (ARICEPT) 10 MG tablet TAKE 1 TABLET BY MOUTH AT  BEDTIME 02/17/17  Yes [name]., MD  furosemide (LASIX) 80 MG tablet Take 1 tablet (80 mg total) by mouth every Tuesday, Thursday, Saturday, and Sunday. 02/01/17  Yes Mody, Ulice Bold, MD  lidocaine-prilocaine (EMLA) cream Apply 1  application topically daily as needed.  04/18/16  Yes [provider]  lovastatin (MEVACOR) 40 MG tablet TAKE 1 TABLET BY MOUTH EVERY DAY FOR HIGH CHOLESTEROL 08/10/14  Yes [provider]  metoprolol tartrate (LOPRESSOR) 25 MG tablet Take 0.5 tablets (12.5 mg total) by mouth 2 (two) times daily. 11/16/16  Yes Vaughan Basta, MD  Multiple Vitamin (MULTI-VITAMINS) TABS Take 1 tablet by mouth daily. Reported on 04/01/2016   Yes [provider]  NON FORMULARY Dialysis 3 times a week   Yes [provider]  omeprazole (PRILOSEC) 20 MG capsule TAKE 1 CAPSULE BY MOUTH  DAILY. 11/05/16  Yes [name]., MD  RENVELA 800 MG tablet Take 800 mg by mouth 3 (three) times daily. 11/05/16  Yes [provider]  sucralfate (CARAFATE) 1 g tablet Take 1 tablet (1 g total) by mouth 4 (four) times daily -  before meals and at bedtime. 12/06/16  Yes [name]., MD    No Known Allergies  Family History  Problem Relation Age of Onset  . Alzheimer's disease Mother   . Kidney failure Father   . Bone cancer Sister   . Stomach cancer Sister     Social History Social History  Substance Use Topics  . Smoking status: Current Every Day Smoker    Packs/day: 0.50    Types: Cigarettes  . Smokeless tobacco: Never Used     Comment: smokes about 3 cigarettes per week  . Alcohol use No    Review of Systems  Constitutional: Negative for fever. Eyes: Negative for visual changes. ENT: Negative for sore throat. Cardiovascular: Positive for chest pain as per history of present illness. Respiratory: Negative for shortness of breath. Gastrointestinal: Negative for abdominal pain, vomiting and diarrhea. Genitourinary: Negative for dysuria. Musculoskeletal: Negative for back pain. Skin: Negative for rash. Neurological: Negative for headache.  ____________________________________________   PHYSICAL EXAM:  VITAL SIGNS: ED Triage Vitals  Enc Vitals  Group     BP 07/14/17 0959 113/66     Pulse Rate 07/14/17 0959 82     Resp 07/14/17 0959 15     Temp 07/14/17 0959 97.6 F (36.4 C)     Temp Source 07/14/17 0959 Oral     SpO2 07/14/17 0959 100 %     Weight 07/14/17 1000 182 lb (82.6 kg)     Height 07/14/17 1000 6' 3"  (1.905 m)     Head Circumference --      Peak Flow --      Pain Score 07/14/17 0959 6     Pain Loc --      Pain Edu? --      Excl. in [place]? --      Constitutional: Alert and oriented. Well appearing and in no distress. HEENT   Head: Normocephalic and atraumatic.      Eyes: Conjunctivae are normal. Pupils equal and round.       Ears:         Nose: No congestion/rhinnorhea.   Mouth/Throat: Mucous membranes are moist.   Neck: No stridor. Cardiovascular/Chest: Normal rate, regular rhythm.  No murmurs, rubs,  or gallops. Respiratory: Normal respiratory effort without tachypnea nor retractions. Breath sounds are clear and equal bilaterally. No wheezes/rales/rhonchi. Gastrointestinal: Soft. No distention, no guarding, no rebound. Nontender.    Genitourinary/rectal:Deferred Musculoskeletal: Nontender with normal range of motion in all extremities. No joint effusions.  No lower extremity tenderness.  No edema. Neurologic:  Normal speech and language. No gross or focal neurologic deficits are appreciated. Skin:  Skin is warm, dry and intact. No rash noted. Psychiatric: Mood and affect are normal. Speech and behavior are normal. Patient exhibits appropriate insight and judgment.   ____________________________________________  LABS (pertinent positives/negatives)  Labs Reviewed  BASIC METABOLIC PANEL - Abnormal; Notable for the following:       Result Value   Potassium 3.2 (*)    Chloride 94 (*)    Glucose, Bld 107 (*)    BUN 24 (*)    Creatinine, Ser 2.99 (*)    Calcium 8.5 (*)    GFR calc non Af Amer 18 (*)    GFR calc Af Amer 21 (*)    All other components within normal limits  CBC - Abnormal; Notable  for the following:    WBC 3.2 (*)    RBC 3.33 (*)    Hemoglobin 10.7 (*)    HCT 31.9 (*)    RDW 16.9 (*)    Platelets 99 (*)    All other components within normal limits  TROPONIN I - Abnormal; Notable for the following:    Troponin I 0.03 (*)    All other components within normal limits  TROPONIN I    ____________________________________________    EKG I, Lisa Roca, MD, the attending physician have personally viewed and interpreted all ECGs.  76 bpm and irregularly irregular consistent with atrial fibrillation with occasional PVC. Left bundle-branch block.  Similar to prior EKG. ____________________________________________  RADIOLOGY All Xrays were viewed by me. Imaging interpreted by Radiologist.  Chest x-ray two-view:  IMPRESSION: 1. Mild pulmonary vascular congestion without frank edema or focal airspace disease. __________________________________________  PROCEDURES  Procedure(s) performed: None  Critical Care performed: None  ____________________________________________   ED COURSE / ASSESSMENT AND PLAN  Pertinent labs & imaging results that were available during my care of the patient were reviewed by me and considered in my medical decision making (see chart for details).   Mr. Feuerborn reported a nonspecific chest discomfort associated with nausea at the end of his dialysis. He states that he's had nausea near the end of dialysis but not typically chest discomfort.  His EKG is similar to prior with left bundle-branch block, PVCs and A. fib. He is currently symptomatic.  Initial troponin is 0.03, will obtain repeat draw. Although I think symptoms are less likely to be ACS.  Not suspicious of other emergency causes of chest discomfort as discomfort was short-lived and currently gone.  OK for discharge, will ask him to follow up with pcp and cardiology.    CONSULTATIONS:   None  Patient / Family / Caregiver informed of clinical course, medical  decision-making process, and agree with plan.   I discussed return precautions, follow-up instructions, and discharge instructions with patient and/or family.  Discharge Instructions : You were evaluated for chest discomfort near the end of dialysis, and although no certain cause was found, your exam and evaluation are overall reassuring in the emergency department today.  Return to the emergency room immediately for any worsening symptoms including chest pain, trouble breathing, sweats, nausea, or any other symptoms concerning to you.  ___________________________________________  FINAL CLINICAL IMPRESSION(S) / ED DIAGNOSES   Final diagnoses:  Nonspecific chest pain              Note: This dictation was prepared with Dragon dictation. Any transcriptional errors that result from this process are unintentional    Lisa Roca, MD 07/14/17 1357

## 2017-07-14 NOTE — ED Notes (Signed)
Discharge not signed topaz not working.

## 2017-07-16 DIAGNOSIS — N2581 Secondary hyperparathyroidism of renal origin: Secondary | ICD-10-CM | POA: Diagnosis not present

## 2017-07-16 DIAGNOSIS — D689 Coagulation defect, unspecified: Secondary | ICD-10-CM | POA: Diagnosis not present

## 2017-07-16 DIAGNOSIS — D509 Iron deficiency anemia, unspecified: Secondary | ICD-10-CM | POA: Diagnosis not present

## 2017-07-16 DIAGNOSIS — N186 End stage renal disease: Secondary | ICD-10-CM | POA: Diagnosis not present

## 2017-07-16 DIAGNOSIS — Z23 Encounter for immunization: Secondary | ICD-10-CM | POA: Diagnosis not present

## 2017-07-16 DIAGNOSIS — D631 Anemia in chronic kidney disease: Secondary | ICD-10-CM | POA: Diagnosis not present

## 2017-07-17 ENCOUNTER — Telehealth: Payer: Self-pay | Admitting: Family Medicine

## 2017-07-17 ENCOUNTER — Other Ambulatory Visit: Payer: Self-pay | Admitting: Family Medicine

## 2017-07-17 DIAGNOSIS — K219 Gastro-esophageal reflux disease without esophagitis: Secondary | ICD-10-CM

## 2017-07-17 MED ORDER — SUCRALFATE 1 G PO TABS: 1.0000 g | ORAL_TABLET | Freq: Three times a day (TID) | ORAL | 11 refills | Status: DC

## 2017-07-17 NOTE — Telephone Encounter (Signed)
[facility] faxed a request for the following medication. Thanks CC  sucralfate (CARAFATE) 1 g tablet

## 2017-07-17 NOTE — Telephone Encounter (Signed)
Frank Huang is concerned that Lance is not wanting to go to take his treatment.  They are having a meeting social worker next Wednesday.  Frank Huang is wanting Dr. Rosanna Randy to talk to the social worker.  She wants to talk to a nurse [phone].  She does not want to talk in front of [place]...  Thanks [name]

## 2017-07-18 ENCOUNTER — Encounter: Payer: Self-pay | Admitting: Family Medicine

## 2017-07-18 DIAGNOSIS — N2581 Secondary hyperparathyroidism of renal origin: Secondary | ICD-10-CM | POA: Diagnosis not present

## 2017-07-18 DIAGNOSIS — Z23 Encounter for immunization: Secondary | ICD-10-CM | POA: Diagnosis not present

## 2017-07-18 DIAGNOSIS — N186 End stage renal disease: Secondary | ICD-10-CM | POA: Diagnosis not present

## 2017-07-18 DIAGNOSIS — D689 Coagulation defect, unspecified: Secondary | ICD-10-CM | POA: Diagnosis not present

## 2017-07-18 DIAGNOSIS — D631 Anemia in chronic kidney disease: Secondary | ICD-10-CM | POA: Diagnosis not present

## 2017-07-18 DIAGNOSIS — D509 Iron deficiency anemia, unspecified: Secondary | ICD-10-CM | POA: Diagnosis not present

## 2017-07-21 DIAGNOSIS — N2581 Secondary hyperparathyroidism of renal origin: Secondary | ICD-10-CM | POA: Diagnosis not present

## 2017-07-21 DIAGNOSIS — Z23 Encounter for immunization: Secondary | ICD-10-CM | POA: Diagnosis not present

## 2017-07-21 DIAGNOSIS — D689 Coagulation defect, unspecified: Secondary | ICD-10-CM | POA: Diagnosis not present

## 2017-07-21 DIAGNOSIS — D509 Iron deficiency anemia, unspecified: Secondary | ICD-10-CM | POA: Diagnosis not present

## 2017-07-21 DIAGNOSIS — N186 End stage renal disease: Secondary | ICD-10-CM | POA: Diagnosis not present

## 2017-07-21 DIAGNOSIS — D631 Anemia in chronic kidney disease: Secondary | ICD-10-CM | POA: Diagnosis not present

## 2017-07-21 NOTE — Telephone Encounter (Signed)
Spoke with Frank Huang, patient is always complaining of his lower back hurting. He states that is why he can not go to dialysis due to the pain. He also does not do much at all at home due to the pain. Social Worker Hurshel Keys from dialysis is coming on Wednesday to discuss things with them. Wife wants Dr Alben Spittle opinion on how bad is patient's back. Should he be hurting as much as he says he does per your opinion. There is Bone xray of his body in the imaging area in the chart please review. I then need to call Hurshel Keys and let her know what your opinion is or if you can call her wife stated. Number is [phone]. Please review. Thank Frank Huang, RMA

## 2017-07-22 ENCOUNTER — Ambulatory Visit
Admission: RE | Admit: 2017-07-22 | Discharge: 2017-07-22 | Disposition: A | Discharge status: Home / Self Care | Payer: Medicare Other | Source: Ambulatory Visit | Attending: Urgent Care | Admitting: Urgent Care

## 2017-07-22 DIAGNOSIS — R93422 Abnormal radiologic findings on diagnostic imaging of left kidney: Secondary | ICD-10-CM | POA: Insufficient documentation

## 2017-07-22 DIAGNOSIS — D696 Thrombocytopenia, unspecified: Secondary | ICD-10-CM | POA: Insufficient documentation

## 2017-07-22 DIAGNOSIS — Q619 Cystic kidney disease, unspecified: Secondary | ICD-10-CM | POA: Diagnosis not present

## 2017-07-22 DIAGNOSIS — R93421 Abnormal radiologic findings on diagnostic imaging of right kidney: Secondary | ICD-10-CM | POA: Diagnosis not present

## 2017-07-22 DIAGNOSIS — C9000 Multiple myeloma not having achieved remission: Secondary | ICD-10-CM | POA: Insufficient documentation

## 2017-07-22 NOTE — Telephone Encounter (Signed)
Spoke with Carolann and advised as below she asked to have this in witting and fax over. Done. And wife is advised of this also.Kris Mouton, RMA

## 2017-07-22 NOTE — Telephone Encounter (Signed)
He has mild arthritis of the back and degenerative disc disease. No metastasis to the back on the last scan. We can see him for this without think this is a severe problem for this patient.

## 2017-07-23 DIAGNOSIS — N186 End stage renal disease: Secondary | ICD-10-CM | POA: Diagnosis not present

## 2017-07-23 DIAGNOSIS — Z23 Encounter for immunization: Secondary | ICD-10-CM | POA: Diagnosis not present

## 2017-07-23 DIAGNOSIS — D631 Anemia in chronic kidney disease: Secondary | ICD-10-CM | POA: Diagnosis not present

## 2017-07-23 DIAGNOSIS — N2581 Secondary hyperparathyroidism of renal origin: Secondary | ICD-10-CM | POA: Diagnosis not present

## 2017-07-23 DIAGNOSIS — D509 Iron deficiency anemia, unspecified: Secondary | ICD-10-CM | POA: Diagnosis not present

## 2017-07-23 DIAGNOSIS — D689 Coagulation defect, unspecified: Secondary | ICD-10-CM | POA: Diagnosis not present

## 2017-07-25 DIAGNOSIS — D631 Anemia in chronic kidney disease: Secondary | ICD-10-CM | POA: Diagnosis not present

## 2017-07-25 DIAGNOSIS — D509 Iron deficiency anemia, unspecified: Secondary | ICD-10-CM | POA: Diagnosis not present

## 2017-07-25 DIAGNOSIS — N2581 Secondary hyperparathyroidism of renal origin: Secondary | ICD-10-CM | POA: Diagnosis not present

## 2017-07-25 DIAGNOSIS — Z23 Encounter for immunization: Secondary | ICD-10-CM | POA: Diagnosis not present

## 2017-07-25 DIAGNOSIS — N186 End stage renal disease: Secondary | ICD-10-CM | POA: Diagnosis not present

## 2017-07-25 DIAGNOSIS — D689 Coagulation defect, unspecified: Secondary | ICD-10-CM | POA: Diagnosis not present

## 2017-07-28 DIAGNOSIS — D631 Anemia in chronic kidney disease: Secondary | ICD-10-CM | POA: Diagnosis not present

## 2017-07-28 DIAGNOSIS — D509 Iron deficiency anemia, unspecified: Secondary | ICD-10-CM | POA: Diagnosis not present

## 2017-07-28 DIAGNOSIS — N186 End stage renal disease: Secondary | ICD-10-CM | POA: Diagnosis not present

## 2017-07-28 DIAGNOSIS — Z23 Encounter for immunization: Secondary | ICD-10-CM | POA: Diagnosis not present

## 2017-07-28 DIAGNOSIS — D689 Coagulation defect, unspecified: Secondary | ICD-10-CM | POA: Diagnosis not present

## 2017-07-28 DIAGNOSIS — N2581 Secondary hyperparathyroidism of renal origin: Secondary | ICD-10-CM | POA: Diagnosis not present

## 2017-07-30 DIAGNOSIS — N2581 Secondary hyperparathyroidism of renal origin: Secondary | ICD-10-CM | POA: Diagnosis not present

## 2017-07-30 DIAGNOSIS — D509 Iron deficiency anemia, unspecified: Secondary | ICD-10-CM | POA: Diagnosis not present

## 2017-07-30 DIAGNOSIS — D631 Anemia in chronic kidney disease: Secondary | ICD-10-CM | POA: Diagnosis not present

## 2017-07-30 DIAGNOSIS — N186 End stage renal disease: Secondary | ICD-10-CM | POA: Diagnosis not present

## 2017-07-30 DIAGNOSIS — D689 Coagulation defect, unspecified: Secondary | ICD-10-CM | POA: Diagnosis not present

## 2017-07-30 DIAGNOSIS — Z23 Encounter for immunization: Secondary | ICD-10-CM | POA: Diagnosis not present

## 2017-08-01 DIAGNOSIS — D631 Anemia in chronic kidney disease: Secondary | ICD-10-CM | POA: Diagnosis not present

## 2017-08-01 DIAGNOSIS — D509 Iron deficiency anemia, unspecified: Secondary | ICD-10-CM | POA: Diagnosis not present

## 2017-08-01 DIAGNOSIS — N186 End stage renal disease: Secondary | ICD-10-CM | POA: Diagnosis not present

## 2017-08-01 DIAGNOSIS — Z23 Encounter for immunization: Secondary | ICD-10-CM | POA: Diagnosis not present

## 2017-08-01 DIAGNOSIS — N2581 Secondary hyperparathyroidism of renal origin: Secondary | ICD-10-CM | POA: Diagnosis not present

## 2017-08-01 DIAGNOSIS — D689 Coagulation defect, unspecified: Secondary | ICD-10-CM | POA: Diagnosis not present

## 2017-08-04 DIAGNOSIS — Z23 Encounter for immunization: Secondary | ICD-10-CM | POA: Diagnosis not present

## 2017-08-04 DIAGNOSIS — D631 Anemia in chronic kidney disease: Secondary | ICD-10-CM | POA: Diagnosis not present

## 2017-08-04 DIAGNOSIS — D509 Iron deficiency anemia, unspecified: Secondary | ICD-10-CM | POA: Diagnosis not present

## 2017-08-04 DIAGNOSIS — N2581 Secondary hyperparathyroidism of renal origin: Secondary | ICD-10-CM | POA: Diagnosis not present

## 2017-08-04 DIAGNOSIS — N186 End stage renal disease: Secondary | ICD-10-CM | POA: Diagnosis not present

## 2017-08-04 DIAGNOSIS — D689 Coagulation defect, unspecified: Secondary | ICD-10-CM | POA: Diagnosis not present

## 2017-08-06 DIAGNOSIS — D509 Iron deficiency anemia, unspecified: Secondary | ICD-10-CM | POA: Diagnosis not present

## 2017-08-06 DIAGNOSIS — Z23 Encounter for immunization: Secondary | ICD-10-CM | POA: Diagnosis not present

## 2017-08-06 DIAGNOSIS — N2581 Secondary hyperparathyroidism of renal origin: Secondary | ICD-10-CM | POA: Diagnosis not present

## 2017-08-06 DIAGNOSIS — N186 End stage renal disease: Secondary | ICD-10-CM | POA: Diagnosis not present

## 2017-08-06 DIAGNOSIS — D631 Anemia in chronic kidney disease: Secondary | ICD-10-CM | POA: Diagnosis not present

## 2017-08-06 DIAGNOSIS — D689 Coagulation defect, unspecified: Secondary | ICD-10-CM | POA: Diagnosis not present

## 2017-08-08 DIAGNOSIS — D509 Iron deficiency anemia, unspecified: Secondary | ICD-10-CM | POA: Diagnosis not present

## 2017-08-08 DIAGNOSIS — D689 Coagulation defect, unspecified: Secondary | ICD-10-CM | POA: Diagnosis not present

## 2017-08-08 DIAGNOSIS — N186 End stage renal disease: Secondary | ICD-10-CM | POA: Diagnosis not present

## 2017-08-08 DIAGNOSIS — D631 Anemia in chronic kidney disease: Secondary | ICD-10-CM | POA: Diagnosis not present

## 2017-08-08 DIAGNOSIS — N2581 Secondary hyperparathyroidism of renal origin: Secondary | ICD-10-CM | POA: Diagnosis not present

## 2017-08-08 DIAGNOSIS — Z23 Encounter for immunization: Secondary | ICD-10-CM | POA: Diagnosis not present

## 2017-08-10 DIAGNOSIS — N186 End stage renal disease: Secondary | ICD-10-CM | POA: Diagnosis not present

## 2017-08-10 DIAGNOSIS — Z992 Dependence on renal dialysis: Secondary | ICD-10-CM | POA: Diagnosis not present

## 2017-08-10 DIAGNOSIS — I120 Hypertensive chronic kidney disease with stage 5 chronic kidney disease or end stage renal disease: Secondary | ICD-10-CM | POA: Diagnosis not present

## 2017-08-11 DIAGNOSIS — N2581 Secondary hyperparathyroidism of renal origin: Secondary | ICD-10-CM | POA: Diagnosis not present

## 2017-08-11 DIAGNOSIS — D509 Iron deficiency anemia, unspecified: Secondary | ICD-10-CM | POA: Diagnosis not present

## 2017-08-11 DIAGNOSIS — D631 Anemia in chronic kidney disease: Secondary | ICD-10-CM | POA: Diagnosis not present

## 2017-08-11 DIAGNOSIS — D689 Coagulation defect, unspecified: Secondary | ICD-10-CM | POA: Diagnosis not present

## 2017-08-11 DIAGNOSIS — N186 End stage renal disease: Secondary | ICD-10-CM | POA: Diagnosis not present

## 2017-08-13 DIAGNOSIS — D509 Iron deficiency anemia, unspecified: Secondary | ICD-10-CM | POA: Diagnosis not present

## 2017-08-13 DIAGNOSIS — D689 Coagulation defect, unspecified: Secondary | ICD-10-CM | POA: Diagnosis not present

## 2017-08-13 DIAGNOSIS — N186 End stage renal disease: Secondary | ICD-10-CM | POA: Diagnosis not present

## 2017-08-13 DIAGNOSIS — N2581 Secondary hyperparathyroidism of renal origin: Secondary | ICD-10-CM | POA: Diagnosis not present

## 2017-08-13 DIAGNOSIS — D631 Anemia in chronic kidney disease: Secondary | ICD-10-CM | POA: Diagnosis not present

## 2017-08-15 DIAGNOSIS — N186 End stage renal disease: Secondary | ICD-10-CM | POA: Diagnosis not present

## 2017-08-15 DIAGNOSIS — D509 Iron deficiency anemia, unspecified: Secondary | ICD-10-CM | POA: Diagnosis not present

## 2017-08-15 DIAGNOSIS — D689 Coagulation defect, unspecified: Secondary | ICD-10-CM | POA: Diagnosis not present

## 2017-08-15 DIAGNOSIS — D631 Anemia in chronic kidney disease: Secondary | ICD-10-CM | POA: Diagnosis not present

## 2017-08-15 DIAGNOSIS — N2581 Secondary hyperparathyroidism of renal origin: Secondary | ICD-10-CM | POA: Diagnosis not present

## 2017-08-18 DIAGNOSIS — N186 End stage renal disease: Secondary | ICD-10-CM | POA: Diagnosis not present

## 2017-08-18 DIAGNOSIS — D509 Iron deficiency anemia, unspecified: Secondary | ICD-10-CM | POA: Diagnosis not present

## 2017-08-18 DIAGNOSIS — D631 Anemia in chronic kidney disease: Secondary | ICD-10-CM | POA: Diagnosis not present

## 2017-08-18 DIAGNOSIS — D689 Coagulation defect, unspecified: Secondary | ICD-10-CM | POA: Diagnosis not present

## 2017-08-18 DIAGNOSIS — N2581 Secondary hyperparathyroidism of renal origin: Secondary | ICD-10-CM | POA: Diagnosis not present

## 2017-08-20 ENCOUNTER — Ambulatory Visit: Payer: Medicare Other | Admitting: Hematology and Oncology

## 2017-08-20 ENCOUNTER — Other Ambulatory Visit: Payer: Medicare Other

## 2017-08-20 DIAGNOSIS — N186 End stage renal disease: Secondary | ICD-10-CM | POA: Diagnosis not present

## 2017-08-20 DIAGNOSIS — D689 Coagulation defect, unspecified: Secondary | ICD-10-CM | POA: Diagnosis not present

## 2017-08-20 DIAGNOSIS — D509 Iron deficiency anemia, unspecified: Secondary | ICD-10-CM | POA: Diagnosis not present

## 2017-08-20 DIAGNOSIS — D631 Anemia in chronic kidney disease: Secondary | ICD-10-CM | POA: Diagnosis not present

## 2017-08-20 DIAGNOSIS — N2581 Secondary hyperparathyroidism of renal origin: Secondary | ICD-10-CM | POA: Diagnosis not present

## 2017-08-22 DIAGNOSIS — D631 Anemia in chronic kidney disease: Secondary | ICD-10-CM | POA: Diagnosis not present

## 2017-08-22 DIAGNOSIS — N2581 Secondary hyperparathyroidism of renal origin: Secondary | ICD-10-CM | POA: Diagnosis not present

## 2017-08-22 DIAGNOSIS — D689 Coagulation defect, unspecified: Secondary | ICD-10-CM | POA: Diagnosis not present

## 2017-08-22 DIAGNOSIS — D509 Iron deficiency anemia, unspecified: Secondary | ICD-10-CM | POA: Diagnosis not present

## 2017-08-22 DIAGNOSIS — N186 End stage renal disease: Secondary | ICD-10-CM | POA: Diagnosis not present

## 2017-08-25 DIAGNOSIS — D631 Anemia in chronic kidney disease: Secondary | ICD-10-CM | POA: Diagnosis not present

## 2017-08-25 DIAGNOSIS — D689 Coagulation defect, unspecified: Secondary | ICD-10-CM | POA: Diagnosis not present

## 2017-08-25 DIAGNOSIS — N2581 Secondary hyperparathyroidism of renal origin: Secondary | ICD-10-CM | POA: Diagnosis not present

## 2017-08-25 DIAGNOSIS — D509 Iron deficiency anemia, unspecified: Secondary | ICD-10-CM | POA: Diagnosis not present

## 2017-08-25 DIAGNOSIS — N186 End stage renal disease: Secondary | ICD-10-CM | POA: Diagnosis not present

## 2017-08-27 DIAGNOSIS — N2581 Secondary hyperparathyroidism of renal origin: Secondary | ICD-10-CM | POA: Diagnosis not present

## 2017-08-27 DIAGNOSIS — D689 Coagulation defect, unspecified: Secondary | ICD-10-CM | POA: Diagnosis not present

## 2017-08-27 DIAGNOSIS — N186 End stage renal disease: Secondary | ICD-10-CM | POA: Diagnosis not present

## 2017-08-27 DIAGNOSIS — D631 Anemia in chronic kidney disease: Secondary | ICD-10-CM | POA: Diagnosis not present

## 2017-08-27 DIAGNOSIS — D509 Iron deficiency anemia, unspecified: Secondary | ICD-10-CM | POA: Diagnosis not present

## 2017-08-29 DIAGNOSIS — N2581 Secondary hyperparathyroidism of renal origin: Secondary | ICD-10-CM | POA: Diagnosis not present

## 2017-08-29 DIAGNOSIS — D631 Anemia in chronic kidney disease: Secondary | ICD-10-CM | POA: Diagnosis not present

## 2017-08-29 DIAGNOSIS — D509 Iron deficiency anemia, unspecified: Secondary | ICD-10-CM | POA: Diagnosis not present

## 2017-08-29 DIAGNOSIS — N186 End stage renal disease: Secondary | ICD-10-CM | POA: Diagnosis not present

## 2017-08-29 DIAGNOSIS — D689 Coagulation defect, unspecified: Secondary | ICD-10-CM | POA: Diagnosis not present

## 2017-09-01 DIAGNOSIS — N186 End stage renal disease: Secondary | ICD-10-CM | POA: Diagnosis not present

## 2017-09-01 DIAGNOSIS — N2581 Secondary hyperparathyroidism of renal origin: Secondary | ICD-10-CM | POA: Diagnosis not present

## 2017-09-01 DIAGNOSIS — D509 Iron deficiency anemia, unspecified: Secondary | ICD-10-CM | POA: Diagnosis not present

## 2017-09-01 DIAGNOSIS — D631 Anemia in chronic kidney disease: Secondary | ICD-10-CM | POA: Diagnosis not present

## 2017-09-01 DIAGNOSIS — D689 Coagulation defect, unspecified: Secondary | ICD-10-CM | POA: Diagnosis not present

## 2017-09-02 NOTE — Progress Notes (Signed)
[facility] day:  09/03/2017   Chief Complaint: Frank Huang is a 83 y.o. male with smoldering multiple myeloma who is seen for 2 month assessment.  HPI:  The patient was last seen in the medical oncology clinic on 07/09/2017.  At that time, he felt fine. He complained of multiple scabbed/bleeding lesions to the scalp. He is being followed by dermatology and had several lesions removed on 07/04/2017. He continued dialysis 3 x/week. He denied B symptoms. Patient had not used any new medications or herbal products. CBC revealed a white count of 3500 with an ANC of 2100.  Hemoglobin was 11.0, hematocrit 33.0, and platelets 91,000. SPEP showed an M-spike of 0.7g/dL (previously 0.9).  Patient was scheduled for an ultrasound to assess liver and spleen.  We discussed the potential need for bone marrow aspirate and biopsy.  Abdominal ultrasound on 07/22/2017 revealed a normal appearing liver and spleen (7.7 cm). There was no focal lesion identified in the liver with normal direction blood flow towards the liver.  He was seen in the ED on 07/14/2017 after developing left-sided chest pain with radiation into his left upper extremity while at dialysis. EKG showed atrial fibrillation with an occasional PVC and a LBBB at a rate of 76 bpm. CBC revealed hemoglobin of 10.7, hematocrit of 31.9, and platelets 99,000. Potassium was 3.2.  BUN was 24 and creatinine of 2.99. Troponins were normal. Patient was discharged home with instructions to follow-up with his primary care physician and cardiology.  Symptomatically, patient states, "I feel alright". Patient denies physical complaints. Patient has had no recurrent episodes of chest pain since having to go to the ED last month. Patient denies any B symptoms or interval infections.  Patient continues on hemodialysis 3x/week.   Past Medical History:  Diagnosis Date  . Cancer ([place])   . Chronic kidney disease   . COPD (chronic  obstructive pulmonary disease) ([place])   . Coronary artery disease   . GERD (gastroesophageal reflux disease)   . Multiple myeloma ([place])   . Myocardial infarction ([place]) 2005  . Neuropathy   . Shortness of breath dyspnea   . Stroke (cerebrum) ([facility])    weakness Lt hand  . Systolic CHF [facility])     Past Surgical History:  Procedure Laterality Date  . APPENDECTOMY    . AV FISTULA PLACEMENT Left   . CARDIAC CATHETERIZATION N/A 11/15/2016   Procedure: Left Heart Cath and Coronary Angiography;  Surgeon: Isaias Cowman, MD;  Location: [facility];  Service: Cardiovascular;  Laterality: N/A;  . CHOLECYSTECTOMY    . SHOULDER ARTHROSCOPY WITH OPEN ROTATOR CUFF REPAIR Left 09/19/2015   Procedure: SHOULDER ARTHROSCOPY , subacromial decompression, debridement;  Surgeon: Corky Mull, MD;  Location: [facility] ORS;  Service: Orthopedics;  Laterality: Left;  . TOTAL HIP ARTHROPLASTY Left     Family History  Problem Relation Age of Onset  . Alzheimer's disease Mother   . Kidney failure Father   . Bone cancer Sister   . Stomach cancer Sister     Social History:  reports that he has been smoking Cigarettes.  He has been smoking about 0.50 packs per day. He has never used smokeless tobacco. He reports that he does not drink alcohol or use drugs.  He began smoking at age 44. He smokes 20 packs per month (2 cartons). He has a 30-40 pack year smoking history.  The patient is accompanied by his wife, [name], today.  Allergies:  No Known Allergies  Current Medications: Current Outpatient Prescriptions  Medication Sig Dispense Refill  . aspirin EC 81 MG tablet Take 81 mg by mouth daily.    Marland Kitchen donepezil (ARICEPT) 10 MG tablet TAKE 1 TABLET BY MOUTH AT  BEDTIME 90 tablet 3  . furosemide (LASIX) 80 MG tablet Take 1 tablet (80 mg total) by mouth every Tuesday, Thursday, Saturday, and Sunday. 30 tablet 0  . lidocaine-prilocaine (EMLA) cream Apply 1 application topically daily as needed.     . lovastatin  (MEVACOR) 40 MG tablet TAKE 1 TABLET BY MOUTH EVERY DAY FOR HIGH CHOLESTEROL    . metoprolol tartrate (LOPRESSOR) 25 MG tablet Take 0.5 tablets (12.5 mg total) by mouth 2 (two) times daily. 60 tablet 0  . Multiple Vitamin (MULTI-VITAMINS) TABS Take 1 tablet by mouth daily. Reported on 04/01/2016    . NON FORMULARY Dialysis 3 times a week    . omeprazole (PRILOSEC) 20 MG capsule TAKE 1 CAPSULE BY MOUTH  DAILY. 90 capsule 3  . RENVELA 800 MG tablet Take 800 mg by mouth 3 (three) times daily.    . sucralfate (CARAFATE) 1 g tablet Take 1 tablet (1 g total) by mouth 4 (four) times daily -  before meals and at bedtime. 120 tablet 11  . vitamin B-12 (CYANOCOBALAMIN) 1000 MCG tablet Take 1,000 mcg by mouth daily.     No current facility-administered medications for this visit.     Review of Systems:  GENERAL:  Feels "alright".  No fevers or sweats.  Weight down 1 pound. PERFORMANCE STATUS (ECOG):  1 HEENT:  No visual changes, runny nose, sore throat, mouth sores or tenderness. Lungs: No shortness of breath or cough.  No hemoptysis. Cardiac:  No current chest pain, palpitations, orthopnea, or PND.  Interval chest pain evaluation (see HPI). GI:  No nausea, vomiting, diarrhea, constipation, melena or hematochezia. GU:  Dialysis every MWF.  No urgency, frequency, dysuria, or hematuria. Musculoskeletal:  Left hip discomfort s/p replacement.  Back pain (chronic).  Right knee issues.  No muscle tenderness. Extremities:  No pain or swelling. Skin:  Scabbed lesions on head. Bruises easily on baby aspirin (no change).  No rashes or skin changes. Neuro:  Left foot numbness. No headache,weakness, balance or coordination issues. Endocrine:  No diabetes, thyroid issues, hot flashes or night sweats. Psych:  No mood changes, depression or anxiety. Pain:  No focal pain. Review of systems:  All other systems reviewed and found to be negative.  Physical Exam: Blood pressure (!) 105/58, pulse 78, temperature (!)  96.6 F (35.9 C), temperature source Tympanic, resp. rate 20, weight 187 lb 13.3 oz (85.2 kg). GENERAL:  Well developed, well nourished, gentleman sitting comfortably in the exam room in no acute distress. MENTAL STATUS:  Alert and oriented to person, place and time. HEAD:  Thin gray hair.  Male pattern baldness.  Normocephalic, atraumatic, face symmetric, no Cushingoid features. EYES:  Silver rimmed glasses.  Blue eyes.  Pupils equal round and reactive to light and accomodation.  No conjunctivitis or scleral icterus. ENT:  Oropharynx clear without lesion.  Tongue normal.  Dentures.  Mucous membranes moist.  RESPIRATORY:  Clear to auscultation without rales, wheezes or rhonchi. CARDIOVASCULAR:  Regular rate and rhythm without murmur, rub or gallop. ABDOMEN:  Soft, non-tender, with active bowel sounds, and no hepatosplenomegaly.  No masses. SKIN:  Multiple moles.  No rashes, ulcers or lesions. EXTREMITIES: Chronic bilateral lower extremity edema.  No skin discoloration.  No palpable cords. LYMPH  NODES:  No palpable cervical, supraclavicular, axillary or inguinal adenopathy  NEUROLOGICAL: Unremarkable. PSYCH:  Appropriate.   Appointment on 09/03/2017  Component Date Value Ref Range Status  . WBC 09/03/2017 4.0  3.8 - 10.6 K/uL Final  . RBC 09/03/2017 3.42* 4.40 - 5.90 MIL/uL Final  . Hemoglobin 09/03/2017 11.4* 13.0 - 18.0 g/dL Final  . HCT 09/03/2017 34.3* 40.0 - 52.0 % Final  . MCV 09/03/2017 100.1* 80.0 - 100.0 fL Final  . MCH 09/03/2017 33.2  26.0 - 34.0 pg Final  . MCHC 09/03/2017 33.2  32.0 - 36.0 g/dL Final  . RDW 09/03/2017 16.0* 11.5 - 14.5 % Final  . Platelets 09/03/2017 95* 150 - 440 K/uL Final  . Neutrophils Relative % 09/03/2017 62  % Final  . Neutro Abs 09/03/2017 2.5  1.4 - 6.5 K/uL Final  . Lymphocytes Relative 09/03/2017 23  % Final  . Lymphs Abs 09/03/2017 0.9* 1.0 - 3.6 K/uL Final  . Monocytes Relative 09/03/2017 6  % Final  . Monocytes Absolute 09/03/2017 0.2  0.2 -  1.0 K/uL Final  . Eosinophils Relative 09/03/2017 8  % Final  . Eosinophils Absolute 09/03/2017 0.3  0 - 0.7 K/uL Final  . Basophils Relative 09/03/2017 1  % Final  . Basophils Absolute 09/03/2017 0.0  0 - 0.1 K/uL Final  . Sodium 09/03/2017 137  135 - 145 mmol/L Final  . Potassium 09/03/2017 3.0* 3.5 - 5.1 mmol/L Final  . Chloride 09/03/2017 93* 101 - 111 mmol/L Final  . CO2 09/03/2017 32  22 - 32 mmol/L Final  . Glucose, Bld 09/03/2017 120* 65 - 99 mg/dL Final  . BUN 09/03/2017 13  6 - 20 mg/dL Final  . Creatinine, Ser 09/03/2017 2.71* 0.61 - 1.24 mg/dL Final  . Calcium 09/03/2017 8.3* 8.9 - 10.3 mg/dL Final  . Total Protein 09/03/2017 7.9  6.5 - 8.1 g/dL Final  . Albumin 09/03/2017 4.2  3.5 - 5.0 g/dL Final  . AST 09/03/2017 17  15 - 41 U/L Final  . ALT 09/03/2017 11* 17 - 63 U/L Final  . Alkaline Phosphatase 09/03/2017 76  38 - 126 U/L Final  . Total Bilirubin 09/03/2017 1.0  0.3 - 1.2 mg/dL Final  . GFR calc non Af Amer 09/03/2017 20* >60 mL/min Final  . GFR calc Af Amer 09/03/2017 23* >60 mL/min Final   Comment: (NOTE) The eGFR has been calculated using the CKD EPI equation. This calculation has not been validated in all clinical situations. eGFR's persistently <60 mL/min signify possible Chronic Kidney Disease.   . Anion gap 09/03/2017 12  5 - 15 Final    Assessment:  Frank Huang is a 83 y.o. 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  Bone marrow revealed 20% plasma cells. He was treated with thalidomide for 7 months then discontinued in 06/2005 secondary to rash and depression. He was treated briefly with Revlimid in 2013 which was also discontinued secondary to rash.    M spike has been followed: 0.5 gm/dL on 06/15/2013, 0.8 gm/dL on 01/19/2014, 0.5 gm/dL on 11/22/2014, 0.9 on 06/28/2015, 0.7 on 09/20/2015, 0.6 on 01/02/2016, 0.8 on 04/16/2016, 0.6 on 08/21/2016, 0.6 on 11/20/2016, 0.6 on 02/19/2017, 0.9 on 05/21/2017,  and 0.7 on 07/09/2017.  Kappa free light chains were 512 in 06/15/2013, 686 11/02/2013, 929 on 01/19/2014, 1212 in 05/10/2014, 1409 on 11/22/2014, 1119 on 02/28/2015, 851 (ratio 63.84) on 06/28/2015, 1006 (ratio 74.50) on 09/20/2015, 1133 (ratio 59.94) on  01/02/2016, 785.6 (ratio 24.32) on 04/16/2016, 834.9 (ratio 19.19) on 08/21/2016, 627 (ratio 24.4) on 02/19/2017, 822.5 (ratio 31.27) on 05/21/2017.  He has subsequently been followed off therapy.  He had a hip fracture in in 12/2013.  Per notes, pathology revealed no myeloma.  Bone survey on 01/17/2015 revealed a 9 mm lytic lesion in the frontoparietal region (previously 7 mm). Bone survey on 04/11/2016 revealed posterior parietal region of the skull more prominent.  There was diffuse osteopenia. Bone survey on 05/29/2017 revealed no aggressive lytic or sclerotic osseous lesion of the axial and appendicular skeleton.  He has end-stage renal disease unrelated to his myeloma.  He has been on dialysis since 11/2013 (MWF).   He has anemia due to chronic renal insufficiency.  He has a history of iron deficiency. He had a negative colonoscopy and upper endoscopy in 08/2004. Notes indicate he also had a small bowel capsule study. EGD and colonoscopy in 2014 revealed gastric lesions which were cauterized. He receives IV iron and Procrit with dialysis.  Ferritin is elevated (? acute phase reactant as ESR elevated).  Ferritin was 1121 on 07/05/2015 and 743 on 01/02/2016.  Iron saturation was 30% on 07/05/2015.  He has B12 deficiency.  B12 was 120 on 07/05/2015.  Folate was 10.8.  He was on B12 IM monthy (last on 08/02/2016 with primary care).  He is on oral B12.  B12 was 728 and folate 17.8 on 05/28/2017.   He has fluctuating thrombocytopenia.  Platelet count was normal on 11/14/2016.  Platelet count has ranged between 74,000 - 115,000 in the last 4 months without trend.  Normal studies on 05/28/2017 included: hepatitis B and C testing.  ANA was + with reflex -.  He is on no new medications or herbal products.  He has a history of left shoulder discomfort.  MRI on 06/14/2015 revealed a tendinopathy, osteoarthritis, and capsulitis.   Abdomen and pelvic CT scan on 04/30/2016 revealed a 1.3 cm irregularly marginated nodular lesion in the anterior left lower lobe. There was a subtle area of slightly diminished attenuation near the tail of the pancreas of uncertain significance. There were multiple renal lesions (some did not represent simple cysts).   Chest CT on 05/03/2016 revealed a 1.05 cm ill-defined slightly spiculated irregular nodule in the left lower lobe as well as a 0.8 cm nodule in the right lower lobe.  PET scan on 05/16/2016 revealed no evidence of active skeletal multiple myeloma or plasmacytoma.  The LEFT lower lobe pulmonary nodule was 9 mm and had mild metabolic activity (SUV 1.5).  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  Chest CT on 08/20/2016 revealed interval resolution of bilateral lower lobe nodules c/w inflammatory or infectious process.  Ultrasound-guided biopsy of the right parotid nodule on 06/18/2016 was unsuccessful. He was seen by Dr. Carloyn Manner on 06/24/2016.  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 He is followed by Dr. Pryor Ochoa. Repeat soft tissue ultrasound on 06/30/2017 revealed a stable 1.6 cm nodule.   He had a myocardial infarction on 11/14/2016.  He is being medically managed.  Abdominal ultrasound on 07/22/2017 revealed a normal appearing liver and spleen (7.7 cm). There was no focal lesion identified in the liver with normal direction blood flow.  Symptomatically, he feels fine.  Exam is stable.  Hematocrit is 34.3, hemoglobin 11.4, and platelets 95,000. Potassium is low at 3.0. Renal function  stable with a BUN of 13 and a creatinine of 2.71 (CrCl 24.7). Patient received his dialysis  treatment prior to being seen in clinic today.   Plan: 1.  Labs today:  CBC with diff, CMP, free light chains. 2.  Review ultrasound results - spleen and liver normal. 3.  Discuss need for further investigation of his thrombocytopenia.  Will send UPEP to assess for free light chains. Discuss need for repeat bone marrow biopsy in the future if platelet count continues to drop without explnation. Bone marrow would be sent for pathology, flow cytometry, cytogenetics and FISH testing to define individualized course of treatment. Discussed that we would plan to avoid Revlimid and Thalidomide to rash and depression. 4.  RTC in 2 months for MD assessment and labs (CBC with diff, CMP, SPEP, free light chains).   Honor Loh, NP  09/03/2017, 1:59 PM   I saw and evaluated the patient, participating in the key portions of the service and reviewing pertinent diagnostic studies and records.  I reviewed the nurse practitioner's note and agree with the findings and the plan.  The assessment and plan were discussed with the patient.  A few questions were asked by the patient and answered.   Lequita Asal, MD  09/03/2017,1:59 PM

## 2017-09-03 ENCOUNTER — Encounter: Payer: Self-pay | Admitting: Hematology and Oncology

## 2017-09-03 ENCOUNTER — Inpatient Hospital Stay: Payer: Medicare Other

## 2017-09-03 ENCOUNTER — Other Ambulatory Visit: Payer: Self-pay | Admitting: *Deleted

## 2017-09-03 ENCOUNTER — Inpatient Hospital Stay: Payer: Medicare Other | Attending: Hematology and Oncology | Admitting: Hematology and Oncology

## 2017-09-03 VITALS — BP 105/58 | HR 78 | Temp 96.6°F | Resp 20 | Wt 187.8 lb

## 2017-09-03 DIAGNOSIS — I252 Old myocardial infarction: Secondary | ICD-10-CM | POA: Diagnosis not present

## 2017-09-03 DIAGNOSIS — Z7982 Long term (current) use of aspirin: Secondary | ICD-10-CM | POA: Insufficient documentation

## 2017-09-03 DIAGNOSIS — I447 Left bundle-branch block, unspecified: Secondary | ICD-10-CM | POA: Insufficient documentation

## 2017-09-03 DIAGNOSIS — D631 Anemia in chronic kidney disease: Secondary | ICD-10-CM | POA: Insufficient documentation

## 2017-09-03 DIAGNOSIS — C9000 Multiple myeloma not having achieved remission: Secondary | ICD-10-CM | POA: Insufficient documentation

## 2017-09-03 DIAGNOSIS — Z992 Dependence on renal dialysis: Secondary | ICD-10-CM | POA: Diagnosis not present

## 2017-09-03 DIAGNOSIS — D509 Iron deficiency anemia, unspecified: Secondary | ICD-10-CM | POA: Insufficient documentation

## 2017-09-03 DIAGNOSIS — I4891 Unspecified atrial fibrillation: Secondary | ICD-10-CM | POA: Diagnosis not present

## 2017-09-03 DIAGNOSIS — Z800 Family history of malignant neoplasm of digestive organs: Secondary | ICD-10-CM | POA: Diagnosis not present

## 2017-09-03 DIAGNOSIS — N186 End stage renal disease: Secondary | ICD-10-CM

## 2017-09-03 DIAGNOSIS — I2510 Atherosclerotic heart disease of native coronary artery without angina pectoris: Secondary | ICD-10-CM | POA: Diagnosis not present

## 2017-09-03 DIAGNOSIS — Z79899 Other long term (current) drug therapy: Secondary | ICD-10-CM | POA: Diagnosis not present

## 2017-09-03 DIAGNOSIS — I5020 Unspecified systolic (congestive) heart failure: Secondary | ICD-10-CM | POA: Insufficient documentation

## 2017-09-03 DIAGNOSIS — F17210 Nicotine dependence, cigarettes, uncomplicated: Secondary | ICD-10-CM | POA: Insufficient documentation

## 2017-09-03 DIAGNOSIS — J449 Chronic obstructive pulmonary disease, unspecified: Secondary | ICD-10-CM | POA: Diagnosis not present

## 2017-09-03 DIAGNOSIS — Z8673 Personal history of transient ischemic attack (TIA), and cerebral infarction without residual deficits: Secondary | ICD-10-CM

## 2017-09-03 DIAGNOSIS — D696 Thrombocytopenia, unspecified: Secondary | ICD-10-CM

## 2017-09-03 DIAGNOSIS — L988 Other specified disorders of the skin and subcutaneous tissue: Secondary | ICD-10-CM | POA: Insufficient documentation

## 2017-09-03 DIAGNOSIS — E538 Deficiency of other specified B group vitamins: Secondary | ICD-10-CM | POA: Insufficient documentation

## 2017-09-03 DIAGNOSIS — D689 Coagulation defect, unspecified: Secondary | ICD-10-CM | POA: Diagnosis not present

## 2017-09-03 DIAGNOSIS — K219 Gastro-esophageal reflux disease without esophagitis: Secondary | ICD-10-CM | POA: Insufficient documentation

## 2017-09-03 DIAGNOSIS — N2581 Secondary hyperparathyroidism of renal origin: Secondary | ICD-10-CM | POA: Diagnosis not present

## 2017-09-03 DIAGNOSIS — Z808 Family history of malignant neoplasm of other organs or systems: Secondary | ICD-10-CM | POA: Diagnosis not present

## 2017-09-03 LAB — CBC WITH DIFFERENTIAL/PLATELET
Basophils Absolute: 0 10*3/uL (ref 0–0.1)
Basophils Relative: 1 %
Eosinophils Absolute: 0.3 10*3/uL (ref 0–0.7)
Eosinophils Relative: 8 %
HCT: 34.3 % — ABNORMAL LOW (ref 40.0–52.0)
Hemoglobin: 11.4 g/dL — ABNORMAL LOW (ref 13.0–18.0)
Lymphocytes Relative: 23 %
Lymphs Abs: 0.9 10*3/uL — ABNORMAL LOW (ref 1.0–3.6)
MCH: 33.2 pg (ref 26.0–34.0)
MCHC: 33.2 g/dL (ref 32.0–36.0)
MCV: 100.1 fL — ABNORMAL HIGH (ref 80.0–100.0)
Monocytes Absolute: 0.2 10*3/uL (ref 0.2–1.0)
Monocytes Relative: 6 %
Neutro Abs: 2.5 10*3/uL (ref 1.4–6.5)
Neutrophils Relative %: 62 %
Platelets: 95 10*3/uL — ABNORMAL LOW (ref 150–440)
RBC: 3.42 MIL/uL — ABNORMAL LOW (ref 4.40–5.90)
RDW: 16 % — ABNORMAL HIGH (ref 11.5–14.5)
WBC: 4 10*3/uL (ref 3.8–10.6)

## 2017-09-03 LAB — COMPREHENSIVE METABOLIC PANEL
ALT: 11 U/L — ABNORMAL LOW (ref 17–63)
AST: 17 U/L (ref 15–41)
Albumin: 4.2 g/dL (ref 3.5–5.0)
Alkaline Phosphatase: 76 U/L (ref 38–126)
Anion gap: 12 (ref 5–15)
BUN: 13 mg/dL (ref 6–20)
CO2: 32 mmol/L (ref 22–32)
Calcium: 8.3 mg/dL — ABNORMAL LOW (ref 8.9–10.3)
Chloride: 93 mmol/L — ABNORMAL LOW (ref 101–111)
Creatinine, Ser: 2.71 mg/dL — ABNORMAL HIGH (ref 0.61–1.24)
GFR calc Af Amer: 23 mL/min — ABNORMAL LOW (ref >60)
GFR calc non Af Amer: 20 mL/min — ABNORMAL LOW (ref >60)
Glucose, Bld: 120 mg/dL — ABNORMAL HIGH (ref 65–99)
Potassium: 3 mmol/L — ABNORMAL LOW (ref 3.5–5.1)
Sodium: 137 mmol/L (ref 135–145)
Total Bilirubin: 1 mg/dL (ref 0.3–1.2)
Total Protein: 7.9 g/dL (ref 6.5–8.1)

## 2017-09-03 NOTE — Progress Notes (Signed)
Patient offers no complaints today. 

## 2017-09-04 ENCOUNTER — Encounter: Payer: Self-pay | Admitting: Hematology and Oncology

## 2017-09-04 LAB — KAPPA/LAMBDA LIGHT CHAINS
Kappa free light chain: 913.9 mg/L — ABNORMAL HIGH (ref 3.3–19.4)
Kappa, lambda light chain ratio: 34.49 — ABNORMAL HIGH (ref 0.26–1.65)
Lambda free light chains: 26.5 mg/L — ABNORMAL HIGH (ref 5.7–26.3)

## 2017-09-05 ENCOUNTER — Inpatient Hospital Stay: Payer: Medicare Other

## 2017-09-05 ENCOUNTER — Other Ambulatory Visit: Payer: Self-pay

## 2017-09-05 DIAGNOSIS — N2581 Secondary hyperparathyroidism of renal origin: Secondary | ICD-10-CM | POA: Diagnosis not present

## 2017-09-05 DIAGNOSIS — J449 Chronic obstructive pulmonary disease, unspecified: Secondary | ICD-10-CM | POA: Diagnosis not present

## 2017-09-05 DIAGNOSIS — I2510 Atherosclerotic heart disease of native coronary artery without angina pectoris: Secondary | ICD-10-CM | POA: Diagnosis not present

## 2017-09-05 DIAGNOSIS — Z7982 Long term (current) use of aspirin: Secondary | ICD-10-CM | POA: Diagnosis not present

## 2017-09-05 DIAGNOSIS — N186 End stage renal disease: Secondary | ICD-10-CM | POA: Diagnosis not present

## 2017-09-05 DIAGNOSIS — D689 Coagulation defect, unspecified: Secondary | ICD-10-CM | POA: Diagnosis not present

## 2017-09-05 DIAGNOSIS — E538 Deficiency of other specified B group vitamins: Secondary | ICD-10-CM | POA: Diagnosis not present

## 2017-09-05 DIAGNOSIS — D631 Anemia in chronic kidney disease: Secondary | ICD-10-CM | POA: Diagnosis not present

## 2017-09-05 DIAGNOSIS — K219 Gastro-esophageal reflux disease without esophagitis: Secondary | ICD-10-CM | POA: Diagnosis not present

## 2017-09-05 DIAGNOSIS — Z800 Family history of malignant neoplasm of digestive organs: Secondary | ICD-10-CM | POA: Diagnosis not present

## 2017-09-05 DIAGNOSIS — D509 Iron deficiency anemia, unspecified: Secondary | ICD-10-CM | POA: Diagnosis not present

## 2017-09-05 DIAGNOSIS — C9000 Multiple myeloma not having achieved remission: Secondary | ICD-10-CM

## 2017-09-05 DIAGNOSIS — D696 Thrombocytopenia, unspecified: Secondary | ICD-10-CM | POA: Diagnosis not present

## 2017-09-05 DIAGNOSIS — I4891 Unspecified atrial fibrillation: Secondary | ICD-10-CM | POA: Diagnosis not present

## 2017-09-05 DIAGNOSIS — I252 Old myocardial infarction: Secondary | ICD-10-CM | POA: Diagnosis not present

## 2017-09-05 DIAGNOSIS — Z808 Family history of malignant neoplasm of other organs or systems: Secondary | ICD-10-CM | POA: Diagnosis not present

## 2017-09-05 DIAGNOSIS — I5020 Unspecified systolic (congestive) heart failure: Secondary | ICD-10-CM | POA: Diagnosis not present

## 2017-09-05 DIAGNOSIS — I447 Left bundle-branch block, unspecified: Secondary | ICD-10-CM | POA: Diagnosis not present

## 2017-09-05 DIAGNOSIS — Z992 Dependence on renal dialysis: Secondary | ICD-10-CM | POA: Diagnosis not present

## 2017-09-05 DIAGNOSIS — L988 Other specified disorders of the skin and subcutaneous tissue: Secondary | ICD-10-CM | POA: Diagnosis not present

## 2017-09-05 DIAGNOSIS — Z79899 Other long term (current) drug therapy: Secondary | ICD-10-CM | POA: Diagnosis not present

## 2017-09-05 DIAGNOSIS — Z8673 Personal history of transient ischemic attack (TIA), and cerebral infarction without residual deficits: Secondary | ICD-10-CM | POA: Diagnosis not present

## 2017-09-08 DIAGNOSIS — D689 Coagulation defect, unspecified: Secondary | ICD-10-CM | POA: Diagnosis not present

## 2017-09-08 DIAGNOSIS — N2581 Secondary hyperparathyroidism of renal origin: Secondary | ICD-10-CM | POA: Diagnosis not present

## 2017-09-08 DIAGNOSIS — D509 Iron deficiency anemia, unspecified: Secondary | ICD-10-CM | POA: Diagnosis not present

## 2017-09-08 DIAGNOSIS — N186 End stage renal disease: Secondary | ICD-10-CM | POA: Diagnosis not present

## 2017-09-08 DIAGNOSIS — D631 Anemia in chronic kidney disease: Secondary | ICD-10-CM | POA: Diagnosis not present

## 2017-09-08 LAB — UIFE/LIGHT CHAINS/TP QN, 24-HR UR
% BETA, URINE: 38.1 %
ALPHA 1 URINE: 7.2 %
ALPHA 2 UR: 12.3 %
Albumin, U: 24.5 %
FREE KAPPA/LAMBDA RATIO: 121.91 — AB (ref 2.04–10.37)
FREE LAMBDA LT CHAINS, UR: 17.8 mg/L — AB (ref 0.24–6.66)
FREE LT CHN EXCR RATE: 2170 mg/L — AB (ref 1.35–24.19)
GAMMA GLOBULIN URINE: 17.9 %
M-SPIKE %, Urine: 7.4 % — ABNORMAL HIGH
M-Spike, Mg/24 Hr: 31 mg/24 hr — ABNORMAL HIGH
TOTAL VOLUME: 400
Total Protein, Urine-Ur/day: 418 mg/24 hr — ABNORMAL HIGH (ref 30–150)
Total Protein, Urine: 104.6 mg/dL

## 2017-09-10 DIAGNOSIS — D631 Anemia in chronic kidney disease: Secondary | ICD-10-CM | POA: Diagnosis not present

## 2017-09-10 DIAGNOSIS — N186 End stage renal disease: Secondary | ICD-10-CM | POA: Diagnosis not present

## 2017-09-10 DIAGNOSIS — N2581 Secondary hyperparathyroidism of renal origin: Secondary | ICD-10-CM | POA: Diagnosis not present

## 2017-09-10 DIAGNOSIS — D509 Iron deficiency anemia, unspecified: Secondary | ICD-10-CM | POA: Diagnosis not present

## 2017-09-10 DIAGNOSIS — I120 Hypertensive chronic kidney disease with stage 5 chronic kidney disease or end stage renal disease: Secondary | ICD-10-CM | POA: Diagnosis not present

## 2017-09-10 DIAGNOSIS — D689 Coagulation defect, unspecified: Secondary | ICD-10-CM | POA: Diagnosis not present

## 2017-09-10 DIAGNOSIS — Z992 Dependence on renal dialysis: Secondary | ICD-10-CM | POA: Diagnosis not present

## 2017-09-12 DIAGNOSIS — N2581 Secondary hyperparathyroidism of renal origin: Secondary | ICD-10-CM | POA: Diagnosis not present

## 2017-09-12 DIAGNOSIS — D631 Anemia in chronic kidney disease: Secondary | ICD-10-CM | POA: Diagnosis not present

## 2017-09-12 DIAGNOSIS — N186 End stage renal disease: Secondary | ICD-10-CM | POA: Diagnosis not present

## 2017-09-12 DIAGNOSIS — D689 Coagulation defect, unspecified: Secondary | ICD-10-CM | POA: Diagnosis not present

## 2017-09-12 DIAGNOSIS — D509 Iron deficiency anemia, unspecified: Secondary | ICD-10-CM | POA: Diagnosis not present

## 2017-09-15 ENCOUNTER — Telehealth: Payer: Self-pay | Admitting: *Deleted

## 2017-09-15 DIAGNOSIS — D689 Coagulation defect, unspecified: Secondary | ICD-10-CM | POA: Diagnosis not present

## 2017-09-15 DIAGNOSIS — D631 Anemia in chronic kidney disease: Secondary | ICD-10-CM | POA: Diagnosis not present

## 2017-09-15 DIAGNOSIS — N186 End stage renal disease: Secondary | ICD-10-CM | POA: Diagnosis not present

## 2017-09-15 DIAGNOSIS — D509 Iron deficiency anemia, unspecified: Secondary | ICD-10-CM | POA: Diagnosis not present

## 2017-09-15 DIAGNOSIS — N2581 Secondary hyperparathyroidism of renal origin: Secondary | ICD-10-CM | POA: Diagnosis not present

## 2017-09-15 NOTE — Telephone Encounter (Signed)
Asking for results to be called to them. He does not return until December  IFE, Urine (with Tot Prot)  Order: [id number]  Status:  Edited Result - FINAL  Visible to patient:  Yes (MyChart)  Next appt:  09/22/2017 at 10:45 AM in Family Medicine [facility] PROVIDER)    Dx:  Multiple myeloma, remission status un...   Ref Range & Units 10d ago  Total Protein, Urine Not Estab. mg/dL 104.6   Total Protein, Urine-Ur/day 30 - 150 mg/24 hr 418 Abnormally high    Albumin, U % 24.5   ALPHA 1 URINE % 7.2   Alpha 2, Urine % 12.3   % BETA, Urine % 38.1   GAMMA GLOBULIN URINE % 17.9   Free Lt Chn Excr Rate 1.35 - 24.19 mg/L 2,170.00 Abnormally high    Comment: **Results verified by repeat testing**  Free Lambda Lt Chains,Ur 0.24 - 6.66 mg/L 17.80 Abnormally high  VC  Free Kappa/Lambda Ratio 2.04 - 10.37 121.91 Abnormally high  VC  Comment: (NOTE)  Performed At: [facility]  [place], [place] [id number]  Lindon Romp MD IP:[phone]   Immunofixation Result, Urine  Comment VC  Comment: (NOTE)  Bence Jones Protein positive; kappa type.  Immunofixation shows IgA monoclonal protein with kappa light chain  specificity.   Total Volume  400   M-SPIKE %, Urine Not Observed % 7.4 Abnormally high  VC  Comment: AN ADDITIONAL M  SPIKE WAS OBSERVED AT A CONCENTRATION OF 2.1%.   M-Spike, Mg/24 Hr Not Observed mg/24 hr 31 Abnormally high  VC  Note:  Comment VC  Comment: (NOTE)  Protein electrophoresis scan will follow via computer, mail, or  courier delivery.   Resulting Agency  [facility]       Contains abnormal data Kappa/lambda light chains  Order: [id number]  Status:  Final result  Visible to patient:  Yes (MyChart)  Next appt:  09/22/2017 at 10:45 AM in Family Medicine [facility] PROVIDER)  Dx:  Thrombocytopenia ([place]); Multiple myel...   Ref Range & Units 12d ago  Kappa free light chain 3.3 - 19.4 mg/L 913.9 Abnormally high    Lamda free light chains  5.7 - 26.3 mg/L 26.5 Abnormally high    Kappa, lamda light chain ratio 0.26 - 1.65 34.49 Abnormally high    Comment: (NOTE)

## 2017-09-17 DIAGNOSIS — D689 Coagulation defect, unspecified: Secondary | ICD-10-CM | POA: Diagnosis not present

## 2017-09-17 DIAGNOSIS — N186 End stage renal disease: Secondary | ICD-10-CM | POA: Diagnosis not present

## 2017-09-17 DIAGNOSIS — D509 Iron deficiency anemia, unspecified: Secondary | ICD-10-CM | POA: Diagnosis not present

## 2017-09-17 DIAGNOSIS — D631 Anemia in chronic kidney disease: Secondary | ICD-10-CM | POA: Diagnosis not present

## 2017-09-17 DIAGNOSIS — N2581 Secondary hyperparathyroidism of renal origin: Secondary | ICD-10-CM | POA: Diagnosis not present

## 2017-09-18 NOTE — Telephone Encounter (Signed)
Dr. Mike Gip is wanting to compare results from this sample to those from the past. She plans to touch base with the patient on 09/19/2017 to discuss results and plan of care.

## 2017-09-19 ENCOUNTER — Other Ambulatory Visit: Payer: Self-pay | Admitting: Urgent Care

## 2017-09-19 DIAGNOSIS — N2581 Secondary hyperparathyroidism of renal origin: Secondary | ICD-10-CM | POA: Diagnosis not present

## 2017-09-19 DIAGNOSIS — N186 End stage renal disease: Secondary | ICD-10-CM | POA: Diagnosis not present

## 2017-09-19 DIAGNOSIS — D689 Coagulation defect, unspecified: Secondary | ICD-10-CM | POA: Diagnosis not present

## 2017-09-19 DIAGNOSIS — D631 Anemia in chronic kidney disease: Secondary | ICD-10-CM | POA: Diagnosis not present

## 2017-09-19 DIAGNOSIS — D509 Iron deficiency anemia, unspecified: Secondary | ICD-10-CM | POA: Diagnosis not present

## 2017-09-19 DIAGNOSIS — C9000 Multiple myeloma not having achieved remission: Secondary | ICD-10-CM

## 2017-09-22 ENCOUNTER — Ambulatory Visit: Payer: Medicare Other

## 2017-09-22 ENCOUNTER — Ambulatory Visit (INDEPENDENT_AMBULATORY_CARE_PROVIDER_SITE_OTHER): Payer: Medicare Other | Admitting: Family Medicine

## 2017-09-22 VITALS — BP 120/60 | HR 68 | Temp 97.4°F | Resp 14 | Wt 194.0 lb

## 2017-09-22 DIAGNOSIS — Z992 Dependence on renal dialysis: Secondary | ICD-10-CM

## 2017-09-22 DIAGNOSIS — N185 Chronic kidney disease, stage 5: Secondary | ICD-10-CM | POA: Diagnosis not present

## 2017-09-22 DIAGNOSIS — C9000 Multiple myeloma not having achieved remission: Secondary | ICD-10-CM

## 2017-09-22 DIAGNOSIS — G3184 Mild cognitive impairment, so stated: Secondary | ICD-10-CM

## 2017-09-22 DIAGNOSIS — N186 End stage renal disease: Secondary | ICD-10-CM

## 2017-09-22 DIAGNOSIS — D689 Coagulation defect, unspecified: Secondary | ICD-10-CM | POA: Diagnosis not present

## 2017-09-22 DIAGNOSIS — N2581 Secondary hyperparathyroidism of renal origin: Secondary | ICD-10-CM | POA: Diagnosis not present

## 2017-09-22 DIAGNOSIS — D631 Anemia in chronic kidney disease: Secondary | ICD-10-CM | POA: Diagnosis not present

## 2017-09-22 DIAGNOSIS — D509 Iron deficiency anemia, unspecified: Secondary | ICD-10-CM | POA: Diagnosis not present

## 2017-09-22 DIAGNOSIS — I10 Essential (primary) hypertension: Secondary | ICD-10-CM

## 2017-09-22 NOTE — Progress Notes (Signed)
Patient: Frank Huang Male    DOB: February 08, 1932   83 y.o.   MRN: [id number] Visit Date: 09/22/2017  Today's Provider: Wilhemena Durie, MD   Chief Complaint  Patient presents with  . Follow-up   Subjective:    HPI Pt is here for a 4 month follow up. He reports that he is felling well, he just gets tired easily. His memory is getting worse per his wife. Pt has been going to dialysis 3 times a week. Wife would like to discuss what the oncologist has recommended for pt. She is wanting him to start treatment when the new year starts.       No Known Allergies   Current Outpatient Medications:  .  aspirin EC 81 MG tablet, Take 81 mg by mouth daily., Disp: , Rfl:  .  donepezil (ARICEPT) 10 MG tablet, TAKE 1 TABLET BY MOUTH AT  BEDTIME, Disp: 90 tablet, Rfl: 3 .  furosemide (LASIX) 80 MG tablet, Take 1 tablet (80 mg total) by mouth every Tuesday, Thursday, Saturday, and Sunday., Disp: 30 tablet, Rfl: 0 .  lovastatin (MEVACOR) 40 MG tablet, TAKE 1 TABLET BY MOUTH EVERY DAY FOR HIGH CHOLESTEROL, Disp: , Rfl:  .  metoprolol tartrate (LOPRESSOR) 25 MG tablet, Take 0.5 tablets (12.5 mg total) by mouth 2 (two) times daily., Disp: 60 tablet, Rfl: 0 .  Multiple Vitamin (MULTI-VITAMINS) TABS, Take 1 tablet by mouth daily. Reported on 04/01/2016, Disp: , Rfl:  .  NON FORMULARY, Dialysis 3 times a week, Disp: , Rfl:  .  omeprazole (PRILOSEC) 20 MG capsule, TAKE 1 CAPSULE BY MOUTH  DAILY., Disp: 90 capsule, Rfl: 3 .  RENVELA 800 MG tablet, Take 800 mg by mouth 3 (three) times daily., Disp: , Rfl:  .  sucralfate (CARAFATE) 1 g tablet, Take 1 tablet (1 g total) by mouth 4 (four) times daily -  before meals and at bedtime., Disp: 120 tablet, Rfl: 11 .  vitamin B-12 (CYANOCOBALAMIN) 1000 MCG tablet, Take 1,000 mcg by mouth daily., Disp: , Rfl:  .  lidocaine-prilocaine (EMLA) cream, Apply 1 application topically daily as needed. , Disp: , Rfl:   Review of Systems  Constitutional: Positive for  fatigue.  HENT: Negative.   Eyes: Negative.   Respiratory: Negative.   Cardiovascular: Negative.   Gastrointestinal: Negative.   Endocrine: Negative.   Genitourinary: Negative.   Musculoskeletal: Negative.   Skin: Negative.   Allergic/Immunologic: Negative.   Neurological: Negative.   Hematological: Negative.   Psychiatric/Behavioral: Negative.     Social History   Tobacco Use  . Smoking status: Current Every Day Smoker    Packs/day: 0.50    Types: Cigarettes  . Smokeless tobacco: Never Used  . Tobacco comment: smokes about 3 cigarettes per week  Substance Use Topics  . Alcohol use: No    Alcohol/week: 0.0 oz   Objective:   BP 120/60 (BP Location: Right Arm, Patient Position: Sitting, Cuff Size: Normal)   Pulse 68   Temp (!) 97.4 F (36.3 C) (Oral)   Resp 14   Wt 194 lb (88 kg)   BMI 24.25 kg/m  Vitals:   09/22/17 1140  BP: 120/60  Pulse: 68  Resp: 14  Temp: (!) 97.4 F (36.3 C)  TempSrc: Oral  Weight: 194 lb (88 kg)     Physical Exam  Constitutional: He is oriented to person, place, and time. He appears well-developed and well-nourished.  Eyes: Conjunctivae and EOM are normal. Pupils  are equal, round, and reactive to light.  Neck: Normal range of motion. Neck supple.  Cardiovascular: Normal rate, regular rhythm, normal heart sounds and intact distal pulses.  Pulmonary/Chest: Effort normal and breath sounds normal.  Musculoskeletal: Normal range of motion.  Neurological: He is alert and oriented to person, place, and time. He has normal reflexes.  Skin: Skin is warm and dry.  Psychiatric: He has a normal mood and affect. His behavior is normal.        Assessment & Plan:     1. MCI (mild cognitive impairment) with memory loss MMSE 20/30 today.  2. Chronic kidney disease, stage V ([place]) On henoidilysis.  3. Essential hypertension Labs at next OV.  4.CAD All risk factors treated. 5.Multiple Myeloma Try tx per Oncology.     HPI, Exam, and A&P  Transcribed under the direction and in the presence of Richard L. Cranford Mon, MD  Electronically Signed: Katina Dung, CMA I have done the exam and reviewed the above chart and it is accurate to the best of my knowledge. Development worker, community has been used in this note in any air is in the dictation or transcription are unintentional.   Wilhemena Durie, MD  [place]

## 2017-09-24 ENCOUNTER — Telehealth: Payer: Self-pay | Admitting: Urgent Care

## 2017-09-24 DIAGNOSIS — N186 End stage renal disease: Secondary | ICD-10-CM | POA: Diagnosis not present

## 2017-09-24 DIAGNOSIS — D631 Anemia in chronic kidney disease: Secondary | ICD-10-CM | POA: Diagnosis not present

## 2017-09-24 DIAGNOSIS — D509 Iron deficiency anemia, unspecified: Secondary | ICD-10-CM | POA: Diagnosis not present

## 2017-09-24 DIAGNOSIS — D689 Coagulation defect, unspecified: Secondary | ICD-10-CM | POA: Diagnosis not present

## 2017-09-24 DIAGNOSIS — N2581 Secondary hyperparathyroidism of renal origin: Secondary | ICD-10-CM | POA: Diagnosis not present

## 2017-09-24 NOTE — Telephone Encounter (Signed)
Returned call to [employer] (wife) to discuss patient's shoulder pain. His pain is likely arthritic. Dr. Mike Gip does not feel as if the pain is coming from his myeloma. I encouraged patient to make an appointment with his PCP for further evaluation. Wife was asking about plain films and even an MRI. I discussed that without seeing Mr. [name], if would be difficult to justify him needing these studies at this time. She was again advised to take patient to PCP for evaluation and treatment.   Wife notes that she was called for an appointment and she was unaware of what it was for. Appointments checked and patient is scheduled for a CT bone marrow biopsy on 10/06/2017 at 0800. Patient was unaware of this appointment. I do recall that Dr. Mike Gip spoke with a family member of the patient and had potentially decided to hold bone marrow testing until after the first of the year. [name] was not aware of this conversation. I will leave the appointment as it is at this time due to difficulty in scheduling these procedures. I will confirm plan of care with Dr. Mike Gip tomorrow. Either way, the appointment will have to be rescheduled, as patient will be in hemodialysis on this day. He attends hemodialysis on Mondays, Wednesdays, and Fridays from 0600 - 1100.After speaking with primary oncologist, I will communicate with scheduling and patient regarding this appointment.

## 2017-09-25 ENCOUNTER — Telehealth: Payer: Self-pay | Admitting: *Deleted

## 2017-09-25 NOTE — Telephone Encounter (Signed)
Called and spoke to [name] (patient's wife) to inform her that patient has been scheduled for [facility] on Tuesday, November 27th.  Patient should arrive @ 7:30 AM.  Procedure will be @ 8:30 AM. Central scheduling will call with any special instructions.  Verbalized understanding.

## 2017-09-26 ENCOUNTER — Ambulatory Visit (INDEPENDENT_AMBULATORY_CARE_PROVIDER_SITE_OTHER): Payer: Medicare Other | Admitting: Family Medicine

## 2017-09-26 ENCOUNTER — Ambulatory Visit
Admission: RE | Admit: 2017-09-26 | Discharge: 2017-09-26 | Disposition: A | Discharge status: Home / Self Care | Payer: Medicare Other | Source: Ambulatory Visit | Attending: Family Medicine | Admitting: Family Medicine

## 2017-09-26 ENCOUNTER — Telehealth: Payer: Self-pay

## 2017-09-26 ENCOUNTER — Encounter: Payer: Self-pay | Admitting: Family Medicine

## 2017-09-26 VITALS — BP 130/64 | HR 68 | Temp 98.0°F | Resp 16 | Wt 188.0 lb

## 2017-09-26 DIAGNOSIS — C9000 Multiple myeloma not having achieved remission: Secondary | ICD-10-CM

## 2017-09-26 DIAGNOSIS — N186 End stage renal disease: Secondary | ICD-10-CM | POA: Diagnosis not present

## 2017-09-26 DIAGNOSIS — D689 Coagulation defect, unspecified: Secondary | ICD-10-CM | POA: Diagnosis not present

## 2017-09-26 DIAGNOSIS — M898X1 Other specified disorders of bone, shoulder: Secondary | ICD-10-CM

## 2017-09-26 DIAGNOSIS — N2581 Secondary hyperparathyroidism of renal origin: Secondary | ICD-10-CM | POA: Diagnosis not present

## 2017-09-26 DIAGNOSIS — M25511 Pain in right shoulder: Secondary | ICD-10-CM | POA: Diagnosis not present

## 2017-09-26 DIAGNOSIS — D631 Anemia in chronic kidney disease: Secondary | ICD-10-CM | POA: Diagnosis not present

## 2017-09-26 DIAGNOSIS — D509 Iron deficiency anemia, unspecified: Secondary | ICD-10-CM | POA: Diagnosis not present

## 2017-09-26 DIAGNOSIS — R079 Chest pain, unspecified: Secondary | ICD-10-CM | POA: Diagnosis not present

## 2017-09-26 MED ORDER — TRAMADOL HCL 50 MG PO TABS: 50.0000 mg | ORAL_TABLET | Freq: Two times a day (BID) | ORAL | 0 refills | Status: DC | PRN

## 2017-09-26 NOTE — Assessment & Plan Note (Signed)
Managed by Oncology As above, risk factor for bony lesions that could be causing scapular pain

## 2017-09-26 NOTE — Telephone Encounter (Signed)
-----   Message from Virginia Crews, MD sent at 09/26/2017  4:18 PM EST ----- Please let patient know that there are no fractures or lesions seen on shoulder blade, ribs or shoulder.  He should continue tylenol and ice therapy with gentle stretching.  If not improved in a week, we can consider other courses of action.  If he develops chest pain or shortness of breath, he should be seen emergently as this could be from heart attack.  Virginia Crews, MD, MPH [facility] 09/26/2017 4:18 PM

## 2017-09-26 NOTE — Telephone Encounter (Signed)
Pt's wife advised as below and agrees with treatment plan.

## 2017-09-26 NOTE — Assessment & Plan Note (Addendum)
Bony pain and tenderness over spine of scapula Would be concerned about bone pain given multiple myeloma, though bone survey was negative in 05/2017 Will get XRay of scapula and posterior ribs Patient is also at risk for fractures due to bone disease related to ESRD Could be muscular strain related to positioning at HD, but would expect that to improve some by next week If XRay negative and not improving, consider MRI Tramadol to use prn for severe pain

## 2017-09-26 NOTE — Progress Notes (Signed)
Patient: Frank Huang Male    DOB: 12-14-1931   83 y.o.   MRN: [id number] Visit Date: 09/26/2017  Today's Provider: Lavon Paganini, MD   Chief Complaint  Patient presents with  . Shoulder Pain   Subjective:    Shoulder Pain   Pain location: right shoulder blade. This is a new problem. Episode onset: x 3 days. There has been no history of extremity trauma. The problem occurs constantly. The problem has been unchanged. Quality: "throbbing" The pain is at a severity of 8/10. Associated symptoms include a limited range of motion and stiffness. Pertinent negatives include no fever, inability to bear weight, joint locking, joint swelling, numbness or tingling. Exacerbated by: certain movement. He has tried heat, NSAIDS and acetaminophen for the symptoms. The treatment provided no relief. Multiple Myeloma; [facility] advised pt to see PCP   Did use a new neck pillow at dialysis on Tuesday before pain started Followed by Oncology for multiple myeloma Not currently on treatment for MM Supposed to have bone marrow biopsy next week Last bone survey 05/29/17 with no lesions Did have previous hip fracture that was not related to myeloma Denies CP, exertional changes, SOB, palpitations    No Known Allergies   Current Outpatient Medications:  .  aspirin EC 81 MG tablet, Take 81 mg by mouth daily., Disp: , Rfl:  .  donepezil (ARICEPT) 10 MG tablet, TAKE 1 TABLET BY MOUTH AT  BEDTIME, Disp: 90 tablet, Rfl: 3 .  furosemide (LASIX) 80 MG tablet, Take 1 tablet (80 mg total) by mouth every Tuesday, Thursday, Saturday, and Sunday., Disp: 30 tablet, Rfl: 0 .  lidocaine-prilocaine (EMLA) cream, Apply 1 application topically daily as needed. , Disp: , Rfl:  .  lovastatin (MEVACOR) 40 MG tablet, TAKE 1 TABLET BY MOUTH EVERY DAY FOR HIGH CHOLESTEROL, Disp: , Rfl:  .  metoprolol tartrate (LOPRESSOR) 25 MG tablet, Take 0.5 tablets (12.5 mg total) by mouth 2 (two) times daily., Disp: 60 tablet, Rfl:  0 .  Multiple Vitamin (MULTI-VITAMINS) TABS, Take 1 tablet by mouth daily. Reported on 04/01/2016, Disp: , Rfl:  .  NON FORMULARY, Dialysis 3 times a week, Disp: , Rfl:  .  omeprazole (PRILOSEC) 20 MG capsule, TAKE 1 CAPSULE BY MOUTH  DAILY., Disp: 90 capsule, Rfl: 3 .  RENVELA 800 MG tablet, Take 800 mg by mouth 3 (three) times daily., Disp: , Rfl:  .  sucralfate (CARAFATE) 1 g tablet, Take 1 tablet (1 g total) by mouth 4 (four) times daily -  before meals and at bedtime., Disp: 120 tablet, Rfl: 11 .  vitamin B-12 (CYANOCOBALAMIN) 1000 MCG tablet, Take 1,000 mcg by mouth daily., Disp: , Rfl:  .  traMADol (ULTRAM) 50 MG tablet, Take 1 tablet (50 mg total) every 12 (twelve) hours as needed by mouth., Disp: 60 tablet, Rfl: 0  Review of Systems  Constitutional: Negative for fever.  Respiratory: Negative for shortness of breath.   Cardiovascular: Negative for chest pain and palpitations.  Gastrointestinal: Negative for nausea and vomiting.  Musculoskeletal: Positive for stiffness.  Neurological: Negative for tingling and numbness.    Social History   Tobacco Use  . Smoking status: Current Every Day Smoker    Packs/day: 0.50    Types: Cigarettes  . Smokeless tobacco: Never Used  Substance Use Topics  . Alcohol use: No    Alcohol/week: 0.0 oz   Objective:   BP 130/64 (BP Location: Right Arm, Patient Position: Sitting, Cuff  Size: Normal)   Pulse 68   Temp 98 F (36.7 C) (Oral)   Resp 16   Wt 188 lb (85.3 kg)   BMI 23.50 kg/m  Vitals:   09/26/17 1045  BP: 130/64  Pulse: 68  Resp: 16  Temp: 98 F (36.7 C)  TempSrc: Oral  Weight: 188 lb (85.3 kg)     Physical Exam  Constitutional: He appears well-developed and well-nourished. No distress.  HENT:  Head: Normocephalic and atraumatic.  Cardiovascular: Normal rate, regular rhythm, normal heart sounds and intact distal pulses.  No murmur heard. Pulmonary/Chest: Effort normal and breath sounds normal. No respiratory distress.  He has no wheezes. He has no rales.  Musculoskeletal: He exhibits no edema or deformity.       Right shoulder: He exhibits tenderness (over spine of scapula ) and bony tenderness. He exhibits normal range of motion, no swelling, no effusion, no deformity, no spasm and normal strength.       Cervical back: He exhibits no tenderness, no bony tenderness and no deformity.       Right upper arm: Normal. He exhibits no tenderness.  Neurological: He is alert.  Skin: Skin is warm and dry. No rash noted.  Psychiatric: He has a normal mood and affect. His behavior is normal.  Vitals reviewed.       Assessment & Plan:      Problem List Items Addressed This Visit      Other   Multiple myeloma ([place])    Managed by Oncology As above, risk factor for bony lesions that could be causing scapular pain      Relevant Orders   DG Scapula Right   DG Ribs Unilateral Right   Pain of right scapula - Primary    Bony pain and tenderness over spine of scapula Would be concerned about bone pain given multiple myeloma, though bone survey was negative in 05/2017 Will get XRay of scapula and posterior ribs Patient is also at risk for fractures due to bone disease related to ESRD Could be muscular strain related to positioning at HD, but would expect that to improve some by next week If XRay negative and not improving, consider MRI Tramadol to use prn for severe pain      Relevant Orders   DG Scapula Right   DG Ribs Unilateral Right      Return if symptoms worsen or fail to improve.     The entirety of the information documented in the History of Present Illness, Review of Systems and Physical Exam were personally obtained by me. Portions of this information were initially documented by Raquel Sarna Ratchford, CMA and reviewed by me for thoroughness and accuracy.     Lavon Paganini, MD  [facility]

## 2017-09-30 DIAGNOSIS — N2581 Secondary hyperparathyroidism of renal origin: Secondary | ICD-10-CM | POA: Diagnosis not present

## 2017-09-30 DIAGNOSIS — N186 End stage renal disease: Secondary | ICD-10-CM | POA: Diagnosis not present

## 2017-09-30 DIAGNOSIS — D631 Anemia in chronic kidney disease: Secondary | ICD-10-CM | POA: Diagnosis not present

## 2017-09-30 DIAGNOSIS — D509 Iron deficiency anemia, unspecified: Secondary | ICD-10-CM | POA: Diagnosis not present

## 2017-09-30 DIAGNOSIS — D689 Coagulation defect, unspecified: Secondary | ICD-10-CM | POA: Diagnosis not present

## 2017-10-03 DIAGNOSIS — D631 Anemia in chronic kidney disease: Secondary | ICD-10-CM | POA: Diagnosis not present

## 2017-10-03 DIAGNOSIS — D509 Iron deficiency anemia, unspecified: Secondary | ICD-10-CM | POA: Diagnosis not present

## 2017-10-03 DIAGNOSIS — N186 End stage renal disease: Secondary | ICD-10-CM | POA: Diagnosis not present

## 2017-10-03 DIAGNOSIS — D689 Coagulation defect, unspecified: Secondary | ICD-10-CM | POA: Diagnosis not present

## 2017-10-03 DIAGNOSIS — N2581 Secondary hyperparathyroidism of renal origin: Secondary | ICD-10-CM | POA: Diagnosis not present

## 2017-10-05 ENCOUNTER — Other Ambulatory Visit: Payer: Self-pay | Admitting: Radiology

## 2017-10-06 ENCOUNTER — Ambulatory Visit: Payer: Medicare Other

## 2017-10-06 DIAGNOSIS — D689 Coagulation defect, unspecified: Secondary | ICD-10-CM | POA: Diagnosis not present

## 2017-10-06 DIAGNOSIS — N186 End stage renal disease: Secondary | ICD-10-CM | POA: Diagnosis not present

## 2017-10-06 DIAGNOSIS — D509 Iron deficiency anemia, unspecified: Secondary | ICD-10-CM | POA: Diagnosis not present

## 2017-10-06 DIAGNOSIS — D631 Anemia in chronic kidney disease: Secondary | ICD-10-CM | POA: Diagnosis not present

## 2017-10-06 DIAGNOSIS — N2581 Secondary hyperparathyroidism of renal origin: Secondary | ICD-10-CM | POA: Diagnosis not present

## 2017-10-07 ENCOUNTER — Other Ambulatory Visit (HOSPITAL_COMMUNITY)
Admission: RE | Admit: 2017-10-07 | Disposition: A | Discharge status: Home / Self Care | Payer: Medicare Other | Source: Ambulatory Visit | Attending: Hematology and Oncology | Admitting: Hematology and Oncology

## 2017-10-07 ENCOUNTER — Ambulatory Visit
Admission: RE | Admit: 2017-10-07 | Discharge: 2017-10-07 | Disposition: A | Discharge status: Home / Self Care | Payer: Medicare Other | Source: Ambulatory Visit | Attending: Urgent Care | Admitting: Urgent Care

## 2017-10-07 DIAGNOSIS — Z992 Dependence on renal dialysis: Secondary | ICD-10-CM | POA: Insufficient documentation

## 2017-10-07 DIAGNOSIS — I69354 Hemiplegia and hemiparesis following cerebral infarction affecting left non-dominant side: Secondary | ICD-10-CM | POA: Diagnosis not present

## 2017-10-07 DIAGNOSIS — Z79899 Other long term (current) drug therapy: Secondary | ICD-10-CM | POA: Insufficient documentation

## 2017-10-07 DIAGNOSIS — J449 Chronic obstructive pulmonary disease, unspecified: Secondary | ICD-10-CM | POA: Diagnosis not present

## 2017-10-07 DIAGNOSIS — C9000 Multiple myeloma not having achieved remission: Secondary | ICD-10-CM

## 2017-10-07 DIAGNOSIS — Z7982 Long term (current) use of aspirin: Secondary | ICD-10-CM | POA: Diagnosis not present

## 2017-10-07 DIAGNOSIS — F17210 Nicotine dependence, cigarettes, uncomplicated: Secondary | ICD-10-CM | POA: Insufficient documentation

## 2017-10-07 DIAGNOSIS — I252 Old myocardial infarction: Secondary | ICD-10-CM | POA: Insufficient documentation

## 2017-10-07 DIAGNOSIS — N189 Chronic kidney disease, unspecified: Secondary | ICD-10-CM | POA: Insufficient documentation

## 2017-10-07 DIAGNOSIS — D4989 Neoplasm of unspecified behavior of other specified sites: Secondary | ICD-10-CM | POA: Diagnosis not present

## 2017-10-07 DIAGNOSIS — I5020 Unspecified systolic (congestive) heart failure: Secondary | ICD-10-CM | POA: Diagnosis not present

## 2017-10-07 DIAGNOSIS — Z96642 Presence of left artificial hip joint: Secondary | ICD-10-CM | POA: Insufficient documentation

## 2017-10-07 DIAGNOSIS — D7589 Other specified diseases of blood and blood-forming organs: Secondary | ICD-10-CM | POA: Insufficient documentation

## 2017-10-07 DIAGNOSIS — D539 Nutritional anemia, unspecified: Secondary | ICD-10-CM | POA: Diagnosis not present

## 2017-10-07 DIAGNOSIS — I2510 Atherosclerotic heart disease of native coronary artery without angina pectoris: Secondary | ICD-10-CM | POA: Diagnosis not present

## 2017-10-07 DIAGNOSIS — Z8579 Personal history of other malignant neoplasms of lymphoid, hematopoietic and related tissues: Secondary | ICD-10-CM | POA: Diagnosis not present

## 2017-10-07 DIAGNOSIS — C9030 Solitary plasmacytoma not having achieved remission: Secondary | ICD-10-CM | POA: Insufficient documentation

## 2017-10-07 DIAGNOSIS — D704 Cyclic neutropenia: Secondary | ICD-10-CM | POA: Insufficient documentation

## 2017-10-07 LAB — CBC WITH DIFFERENTIAL/PLATELET
BASOS ABS: 0 10*3/uL (ref 0–0.1)
BASOS PCT: 1 %
EOS ABS: 0.3 10*3/uL (ref 0–0.7)
EOS PCT: 6 %
HCT: 36.1 % — ABNORMAL LOW (ref 40.0–52.0)
Hemoglobin: 11.3 g/dL — ABNORMAL LOW (ref 13.0–18.0)
Lymphocytes Relative: 15 %
Lymphs Abs: 0.7 10*3/uL — ABNORMAL LOW (ref 1.0–3.6)
MCH: 32.4 pg (ref 26.0–34.0)
MCHC: 31.4 g/dL — ABNORMAL LOW (ref 32.0–36.0)
MCV: 103.2 fL — ABNORMAL HIGH (ref 80.0–100.0)
Monocytes Absolute: 0.3 10*3/uL (ref 0.2–1.0)
Monocytes Relative: 6 %
NEUTROS PCT: 72 %
Neutro Abs: 3.3 10*3/uL (ref 1.4–6.5)
PLATELETS: 76 10*3/uL — AB (ref 150–440)
RBC: 3.5 MIL/uL — ABNORMAL LOW (ref 4.40–5.90)
RDW: 17 % — ABNORMAL HIGH (ref 11.5–14.5)
WBC: 4.6 10*3/uL (ref 3.8–10.6)

## 2017-10-07 LAB — PROTIME-INR
INR: 1.14
PROTHROMBIN TIME: 14.5 s (ref 11.4–15.2)

## 2017-10-07 LAB — APTT: APTT: 30 s (ref 24–36)

## 2017-10-07 MED ORDER — LIDOCAINE HCL 1 % IJ SOLN
INTRAMUSCULAR | Status: AC | PRN
  Administered 2017-10-07: 10 mL via INTRADERMAL

## 2017-10-07 MED ORDER — FENTANYL CITRATE (PF) 100 MCG/2ML IJ SOLN
INTRAMUSCULAR | Status: AC | PRN
  Administered 2017-10-07 (×3): 25 ug via INTRAVENOUS

## 2017-10-07 MED ORDER — SODIUM CHLORIDE 0.9 % IV SOLN
INTRAVENOUS | Status: DC
  Administered 2017-10-07: 08:00:00 via INTRAVENOUS

## 2017-10-07 NOTE — Procedures (Signed)
Interventional Radiology Procedure Note  Procedure: CT guided aspirate and core biopsy of left posterior iliac bone Complications: None Recommendations: - Bedrest supine x 1 hrs - OTC's PRN  Pain - Follow biopsy results  Signed,  Daryon Remmert S. Emika Tiano, DO    

## 2017-10-07 NOTE — OR Nursing (Signed)
Pt reported drinking half cup of coffee 2 hours ago. Dr Earleen Newport notified. Pt agreeable with just getting local anesthetic and only one other medication (either versed or fentanyl)

## 2017-10-07 NOTE — H&P (Signed)
Chief Complaint: Multiple Myeloma  Referring Physician(s): [name].  Supervising Physician: Corrie Mckusick  Patient Status: [facility] - Out-pt  History of Present Illness: Frank Huang is a 83 y.o. male with a diagnosis of multiple myeloma.  He is here today for a bone marrow biopsy.  Other medical issues include COPD, renal failure on hemodialysis, and CHF.  He has no complaints today.  He has some mild cognitive impairment but understands why he is here and the procedure.  He had a 1/2 cup of coffee about 2 hours ago despite instructions to be NPO.   Past Medical History:  Diagnosis Date  . Cancer ([place])   . Chronic kidney disease   . COPD (chronic obstructive pulmonary disease) ([place])   . Coronary artery disease   . GERD (gastroesophageal reflux disease)   . Multiple myeloma ([place])   . Myocardial infarction ([place]) 2005  . Neuropathy   . Shortness of breath dyspnea   . Stroke (cerebrum) ([facility])    weakness Lt hand  . Systolic CHF [facility])     Past Surgical History:  Procedure Laterality Date  . APPENDECTOMY    . AV FISTULA PLACEMENT Left   . CARDIAC CATHETERIZATION N/A 11/15/2016   Procedure: Left Heart Cath and Coronary Angiography;  Surgeon: Isaias Cowman, MD;  Location: [facility];  Service: Cardiovascular;  Laterality: N/A;  . CHOLECYSTECTOMY    . SHOULDER ARTHROSCOPY WITH OPEN ROTATOR CUFF REPAIR Left 09/19/2015   Procedure: SHOULDER ARTHROSCOPY , subacromial decompression, debridement;  Surgeon: Corky Mull, MD;  Location: [facility] ORS;  Service: Orthopedics;  Laterality: Left;  . TOTAL HIP ARTHROPLASTY Left     Allergies: Patient has no known allergies.  Medications: Prior to Admission medications   Medication Sig Start Date End Date Taking? Authorizing Provider  aspirin EC 81 MG tablet Take 81 mg by mouth daily.   Yes [provider]  donepezil (ARICEPT) 10 MG tablet TAKE 1 TABLET BY MOUTH AT  BEDTIME 02/17/17  Yes [name]., MD  furosemide (LASIX) 80 MG tablet Take 1 tablet (80 mg total) by mouth every Tuesday, Thursday, Saturday, and Sunday. 02/01/17  Yes Mody, Ulice Bold, MD  lovastatin (MEVACOR) 40 MG tablet TAKE 1 TABLET BY MOUTH EVERY DAY FOR HIGH CHOLESTEROL 08/10/14  Yes [provider]  metoprolol tartrate (LOPRESSOR) 25 MG tablet Take 0.5 tablets (12.5 mg total) by mouth 2 (two) times daily. 11/16/16  Yes Vaughan Basta, MD  Multiple Vitamin (MULTI-VITAMINS) TABS Take 1 tablet by mouth daily. Reported on 04/01/2016   Yes [provider]  omeprazole (PRILOSEC) 20 MG capsule TAKE 1 CAPSULE BY MOUTH  DAILY. 11/05/16  Yes [name]., MD  RENVELA 800 MG tablet Take 800 mg by mouth 3 (three) times daily. 11/05/16  Yes [provider]  sucralfate (CARAFATE) 1 g tablet Take 1 tablet (1 g total) by mouth 4 (four) times daily -  before meals and at bedtime. 07/17/17  Yes [name]., MD  traMADol (ULTRAM) 50 MG tablet Take 1 tablet (50 mg total) every 12 (twelve) hours as needed by mouth. 09/26/17  Yes Bacigalupo, Dionne Bucy, MD  vitamin B-12 (CYANOCOBALAMIN) 1000 MCG tablet Take 1,000 mcg by mouth daily.   Yes [provider]  lidocaine-prilocaine (EMLA) cream Apply 1 application topically daily as needed.  04/18/16   [provider]  NON FORMULARY Dialysis 3 times a week    [provider]  Family History  Problem Relation Age of Onset  . Alzheimer's disease Mother   . Kidney failure Father   . Bone cancer Sister   . Stomach cancer Sister     Social History   Socioeconomic History  . Marital status: Married    Spouse name: None  . Number of children: None  . Years of education: None  . Highest education level: None  Social Needs  . Financial resource strain: None  . Food insecurity - worry: None  . Food insecurity - inability: None  . Transportation needs - medical: None  . Transportation needs - non-medical: None    Occupational History  . None  Tobacco Use  . Smoking status: Current Every Day Smoker    Packs/day: 0.50    Types: Cigarettes  . Smokeless tobacco: Never Used  Substance and Sexual Activity  . Alcohol use: No    Alcohol/week: 0.0 oz  . Drug use: No  . Sexual activity: None  Other Topics Concern  . None  Social History Narrative  . None    Review of Systems: A 12 point ROS discussed  Review of Systems  Constitutional: Positive for fatigue.  HENT: Negative.   Respiratory: Negative.   Cardiovascular: Negative.   Gastrointestinal: Negative.   Genitourinary: Negative.   Musculoskeletal: Negative.   Skin: Negative.   Neurological: Negative.   Hematological: Negative.   Psychiatric/Behavioral: Negative.     Vital Signs: BP 121/63   Pulse 67   Temp 97.7 F (36.5 C) (Oral)   SpO2 100%   Physical Exam  Constitutional: He is oriented to person, place, and time. He appears well-developed and well-nourished.  HENT:  Head: Normocephalic and atraumatic.  Eyes: EOM are normal.  Neck: Normal range of motion.  Cardiovascular: Normal rate, regular rhythm and normal heart sounds.  Pulmonary/Chest: Effort normal and breath sounds normal. No stridor. No respiratory distress.  Abdominal: Soft. There is no tenderness.  Musculoskeletal: Normal range of motion.  Neurological: He is alert and oriented to person, place, and time.  Skin: Skin is warm and dry.  Psychiatric: He has a normal mood and affect. His behavior is normal. Judgment and thought content normal.  Vitals reviewed.   Imaging: Dg Ribs Unilateral Right  Result Date: 09/26/2017 CLINICAL DATA:  Right-sided chest pain, no known injury, history of multiple myeloma, initial encounter EXAM: RIGHT RIBS - 2 VIEW COMPARISON:  07/14/2017 FINDINGS: No acute fracture or dislocation is noted in the shoulder joint. Degenerative changes of the acromioclavicular joint are seen. No rib fractures are noted. No pneumothorax is seen. No  acute bony abnormality is noted. IMPRESSION: No acute abnormality seen. Electronically Signed   By: [name] M.D.   On: 09/26/2017 13:36   Dg Scapula Right  Result Date: 09/26/2017 CLINICAL DATA:  Pt states pain at proximal RT scapula since Tuesday with no known injury. Hx/o multiple myeloma, COPD EXAM: RIGHT SCAPULA - 2+ VIEWS COMPARISON:  None. FINDINGS: There is no evidence of fracture or other focal bone lesions. Mild arthropathy of the acromioclavicular joint. Soft tissues are unremarkable. IMPRESSION: No acute osseous injury of the right scapula. Electronically Signed   By: Kathreen Devoid   On: 09/26/2017 13:36    Labs:  CBC: Recent Labs    05/21/17 1325 07/09/17 1052 07/14/17 0959 09/03/17 1038  WBC 4.0 3.5* 3.2* 4.0  HGB 12.4* 11.0* 10.7* 11.4*  HCT 37.8* 33.0* 31.9* 34.3*  PLT 79* 91* 99* 95*    COAGS: Recent Labs  11/15/16 0748  INR 1.12  APTT 122*    BMP: Recent Labs    02/19/17 1335 05/21/17 1325 06/11/17 07/14/17 0959 09/03/17 1038  NA 137 136 139 138 137  K 3.4* 3.7 5.4* 3.2* 3.0*  CL 95* 93*  --  94* 93*  CO2 33* 31  --  32 32  GLUCOSE 98 97  --  107* 120*  BUN 17 20 39* 24* 13  CALCIUM 8.8* 8.9  --  8.5* 8.3*  CREATININE 3.00* 3.42* 5.5* 2.99* 2.71*  GFRNONAA 18* 15*  --  18* 20*  GFRAA 21* 18*  --  21* 23*    LIVER FUNCTION TESTS: Recent Labs    01/28/17 0458 02/19/17 1335 05/21/17 1325 09/03/17 1038  BILITOT 1.4* 0.6 1.1 1.0  AST 29 21 19 17   ALT 12* 13* 12* 11*  ALKPHOS 48 70 69 76  PROT 7.5 6.9 7.5 7.9  ALBUMIN 3.7 3.6 4.1 4.2    TUMOR MARKERS: No results for input(s): AFPTM, CEA, CA199, CHROMGRNA in the last 8760 hours.  Assessment and Plan:  Multiple myeloma  Will proceed with bone marrow biopsy today by Dr. Earleen Newport.  Risks and benefits discussed with the patient including, but not limited to bleeding, infection, damage to adjacent structures or low yield requiring additional tests.  All of the patient's questions  were answered, patient is agreeable to proceed. Consent signed and in chart.  Thank you for this interesting consult.  I greatly enjoyed meeting Frank Huang and look forward to participating in their care.  A copy of this report was sent to the requesting provider on this date.  Electronically Signed: Murrell Redden, PA-C 10/07/2017, 8:09 AM   I spent a total of  30 Minutes  in face to face in clinical consultation, greater than 50% of which was counseling/coordinating care for bone marrow biopsy

## 2017-10-08 DIAGNOSIS — N186 End stage renal disease: Secondary | ICD-10-CM | POA: Diagnosis not present

## 2017-10-08 DIAGNOSIS — N2581 Secondary hyperparathyroidism of renal origin: Secondary | ICD-10-CM | POA: Diagnosis not present

## 2017-10-08 DIAGNOSIS — D509 Iron deficiency anemia, unspecified: Secondary | ICD-10-CM | POA: Diagnosis not present

## 2017-10-08 DIAGNOSIS — D689 Coagulation defect, unspecified: Secondary | ICD-10-CM | POA: Diagnosis not present

## 2017-10-08 DIAGNOSIS — D631 Anemia in chronic kidney disease: Secondary | ICD-10-CM | POA: Diagnosis not present

## 2017-10-10 DIAGNOSIS — D509 Iron deficiency anemia, unspecified: Secondary | ICD-10-CM | POA: Diagnosis not present

## 2017-10-10 DIAGNOSIS — D689 Coagulation defect, unspecified: Secondary | ICD-10-CM | POA: Diagnosis not present

## 2017-10-10 DIAGNOSIS — I120 Hypertensive chronic kidney disease with stage 5 chronic kidney disease or end stage renal disease: Secondary | ICD-10-CM | POA: Diagnosis not present

## 2017-10-10 DIAGNOSIS — N2581 Secondary hyperparathyroidism of renal origin: Secondary | ICD-10-CM | POA: Diagnosis not present

## 2017-10-10 DIAGNOSIS — D631 Anemia in chronic kidney disease: Secondary | ICD-10-CM | POA: Diagnosis not present

## 2017-10-10 DIAGNOSIS — N186 End stage renal disease: Secondary | ICD-10-CM | POA: Diagnosis not present

## 2017-10-10 DIAGNOSIS — Z992 Dependence on renal dialysis: Secondary | ICD-10-CM | POA: Diagnosis not present

## 2017-10-13 ENCOUNTER — Telehealth: Payer: Self-pay | Admitting: *Deleted

## 2017-10-13 DIAGNOSIS — D689 Coagulation defect, unspecified: Secondary | ICD-10-CM | POA: Diagnosis not present

## 2017-10-13 DIAGNOSIS — D631 Anemia in chronic kidney disease: Secondary | ICD-10-CM | POA: Diagnosis not present

## 2017-10-13 DIAGNOSIS — D509 Iron deficiency anemia, unspecified: Secondary | ICD-10-CM | POA: Diagnosis not present

## 2017-10-13 DIAGNOSIS — N2581 Secondary hyperparathyroidism of renal origin: Secondary | ICD-10-CM | POA: Diagnosis not present

## 2017-10-13 DIAGNOSIS — N186 End stage renal disease: Secondary | ICD-10-CM | POA: Diagnosis not present

## 2017-10-13 NOTE — Telephone Encounter (Signed)
Called asking for results from last week. I do not see that results of biopsy 11/27 are back yet.

## 2017-10-13 NOTE — Telephone Encounter (Signed)
   We are awaiting results.  M

## 2017-10-13 NOTE — Telephone Encounter (Signed)
Wife advised that results are not back at this time

## 2017-10-15 DIAGNOSIS — N2581 Secondary hyperparathyroidism of renal origin: Secondary | ICD-10-CM | POA: Diagnosis not present

## 2017-10-15 DIAGNOSIS — D509 Iron deficiency anemia, unspecified: Secondary | ICD-10-CM | POA: Diagnosis not present

## 2017-10-15 DIAGNOSIS — N186 End stage renal disease: Secondary | ICD-10-CM | POA: Diagnosis not present

## 2017-10-15 DIAGNOSIS — D689 Coagulation defect, unspecified: Secondary | ICD-10-CM | POA: Diagnosis not present

## 2017-10-15 DIAGNOSIS — D631 Anemia in chronic kidney disease: Secondary | ICD-10-CM | POA: Diagnosis not present

## 2017-10-17 DIAGNOSIS — N186 End stage renal disease: Secondary | ICD-10-CM | POA: Diagnosis not present

## 2017-10-17 DIAGNOSIS — D509 Iron deficiency anemia, unspecified: Secondary | ICD-10-CM | POA: Diagnosis not present

## 2017-10-17 DIAGNOSIS — D631 Anemia in chronic kidney disease: Secondary | ICD-10-CM | POA: Diagnosis not present

## 2017-10-17 DIAGNOSIS — D689 Coagulation defect, unspecified: Secondary | ICD-10-CM | POA: Diagnosis not present

## 2017-10-17 DIAGNOSIS — N2581 Secondary hyperparathyroidism of renal origin: Secondary | ICD-10-CM | POA: Diagnosis not present

## 2017-10-21 ENCOUNTER — Other Ambulatory Visit: Payer: Self-pay | Admitting: *Deleted

## 2017-10-21 DIAGNOSIS — M79674 Pain in right toe(s): Secondary | ICD-10-CM | POA: Diagnosis not present

## 2017-10-21 DIAGNOSIS — M79675 Pain in left toe(s): Secondary | ICD-10-CM | POA: Diagnosis not present

## 2017-10-21 DIAGNOSIS — B351 Tinea unguium: Secondary | ICD-10-CM | POA: Diagnosis not present

## 2017-10-21 DIAGNOSIS — C9000 Multiple myeloma not having achieved remission: Secondary | ICD-10-CM

## 2017-10-22 ENCOUNTER — Inpatient Hospital Stay: Payer: Medicare Other | Attending: Hematology and Oncology | Admitting: Hematology and Oncology

## 2017-10-22 ENCOUNTER — Inpatient Hospital Stay: Payer: Medicare Other

## 2017-10-22 VITALS — BP 94/53 | HR 67 | Temp 95.4°F | Wt 192.2 lb

## 2017-10-22 DIAGNOSIS — D509 Iron deficiency anemia, unspecified: Secondary | ICD-10-CM | POA: Insufficient documentation

## 2017-10-22 DIAGNOSIS — I5020 Unspecified systolic (congestive) heart failure: Secondary | ICD-10-CM | POA: Diagnosis not present

## 2017-10-22 DIAGNOSIS — M549 Dorsalgia, unspecified: Secondary | ICD-10-CM | POA: Diagnosis not present

## 2017-10-22 DIAGNOSIS — E538 Deficiency of other specified B group vitamins: Secondary | ICD-10-CM | POA: Diagnosis not present

## 2017-10-22 DIAGNOSIS — D631 Anemia in chronic kidney disease: Secondary | ICD-10-CM | POA: Diagnosis not present

## 2017-10-22 DIAGNOSIS — Z8673 Personal history of transient ischemic attack (TIA), and cerebral infarction without residual deficits: Secondary | ICD-10-CM | POA: Diagnosis not present

## 2017-10-22 DIAGNOSIS — M1990 Unspecified osteoarthritis, unspecified site: Secondary | ICD-10-CM

## 2017-10-22 DIAGNOSIS — J449 Chronic obstructive pulmonary disease, unspecified: Secondary | ICD-10-CM | POA: Insufficient documentation

## 2017-10-22 DIAGNOSIS — R600 Localized edema: Secondary | ICD-10-CM | POA: Diagnosis not present

## 2017-10-22 DIAGNOSIS — M25511 Pain in right shoulder: Secondary | ICD-10-CM

## 2017-10-22 DIAGNOSIS — N186 End stage renal disease: Secondary | ICD-10-CM | POA: Insufficient documentation

## 2017-10-22 DIAGNOSIS — K219 Gastro-esophageal reflux disease without esophagitis: Secondary | ICD-10-CM | POA: Diagnosis not present

## 2017-10-22 DIAGNOSIS — Z79899 Other long term (current) drug therapy: Secondary | ICD-10-CM | POA: Diagnosis not present

## 2017-10-22 DIAGNOSIS — D696 Thrombocytopenia, unspecified: Secondary | ICD-10-CM | POA: Insufficient documentation

## 2017-10-22 DIAGNOSIS — C9000 Multiple myeloma not having achieved remission: Secondary | ICD-10-CM

## 2017-10-22 DIAGNOSIS — I252 Old myocardial infarction: Secondary | ICD-10-CM | POA: Diagnosis not present

## 2017-10-22 DIAGNOSIS — G8929 Other chronic pain: Secondary | ICD-10-CM | POA: Insufficient documentation

## 2017-10-22 DIAGNOSIS — D689 Coagulation defect, unspecified: Secondary | ICD-10-CM | POA: Diagnosis not present

## 2017-10-22 DIAGNOSIS — F17210 Nicotine dependence, cigarettes, uncomplicated: Secondary | ICD-10-CM

## 2017-10-22 DIAGNOSIS — Z992 Dependence on renal dialysis: Secondary | ICD-10-CM | POA: Diagnosis not present

## 2017-10-22 DIAGNOSIS — I2510 Atherosclerotic heart disease of native coronary artery without angina pectoris: Secondary | ICD-10-CM | POA: Diagnosis not present

## 2017-10-22 DIAGNOSIS — N2581 Secondary hyperparathyroidism of renal origin: Secondary | ICD-10-CM | POA: Diagnosis not present

## 2017-10-22 DIAGNOSIS — Z7982 Long term (current) use of aspirin: Secondary | ICD-10-CM | POA: Diagnosis not present

## 2017-10-22 LAB — COMPREHENSIVE METABOLIC PANEL
ALT: 11 U/L — ABNORMAL LOW (ref 17–63)
AST: 16 U/L (ref 15–41)
Albumin: 3.7 g/dL (ref 3.5–5.0)
Alkaline Phosphatase: 75 U/L (ref 38–126)
Anion gap: 9 (ref 5–15)
BUN: 26 mg/dL — ABNORMAL HIGH (ref 6–20)
CO2: 32 mmol/L (ref 22–32)
Calcium: 8.4 mg/dL — ABNORMAL LOW (ref 8.9–10.3)
Chloride: 97 mmol/L — ABNORMAL LOW (ref 101–111)
Creatinine, Ser: 3.64 mg/dL — ABNORMAL HIGH (ref 0.61–1.24)
GFR calc Af Amer: 16 mL/min — ABNORMAL LOW (ref >60)
GFR calc non Af Amer: 14 mL/min — ABNORMAL LOW (ref >60)
Glucose, Bld: 90 mg/dL (ref 65–99)
Potassium: 3.5 mmol/L (ref 3.5–5.1)
Sodium: 138 mmol/L (ref 135–145)
Total Bilirubin: 1 mg/dL (ref 0.3–1.2)
Total Protein: 8.1 g/dL (ref 6.5–8.1)

## 2017-10-22 LAB — CBC WITH DIFFERENTIAL/PLATELET
Basophils Absolute: 0 10*3/uL (ref 0–0.1)
Basophils Relative: 1 %
Eosinophils Absolute: 0.2 10*3/uL (ref 0–0.7)
Eosinophils Relative: 4 %
HCT: 33.5 % — ABNORMAL LOW (ref 40.0–52.0)
Hemoglobin: 11 g/dL — ABNORMAL LOW (ref 13.0–18.0)
Lymphocytes Relative: 18 %
Lymphs Abs: 0.8 10*3/uL — ABNORMAL LOW (ref 1.0–3.6)
MCH: 32.6 pg (ref 26.0–34.0)
MCHC: 32.7 g/dL (ref 32.0–36.0)
MCV: 99.8 fL (ref 80.0–100.0)
Monocytes Absolute: 0.2 10*3/uL (ref 0.2–1.0)
Monocytes Relative: 5 %
Neutro Abs: 3.1 10*3/uL (ref 1.4–6.5)
Neutrophils Relative %: 72 %
Platelets: 88 10*3/uL — ABNORMAL LOW (ref 150–440)
RBC: 3.36 MIL/uL — ABNORMAL LOW (ref 4.40–5.90)
RDW: 16.3 % — ABNORMAL HIGH (ref 11.5–14.5)
WBC: 4.3 10*3/uL (ref 3.8–10.6)

## 2017-10-22 NOTE — Progress Notes (Signed)
Patient offers no complaints today.  Patient here for BM results.

## 2017-10-22 NOTE — Progress Notes (Signed)
[facility]-  Cancer Center  Clinic day:  10/22/2017   Chief Complaint: Frank Huang is a 83 y.o. male with smoldering multiple myeloma who is seen for 2 month assessment and review of interval bone marrow.  HPI:  The patient was last seen in the medical oncology clinic on 09/03/2017.  At that time, he felt fine.  Exam was stable.  Hematocrit was 34.3, hemoglobin 11.4, and platelets 95,000. Potassium was low at 3.0.  He continued dialysis.  We discussed a follow-up bone marrow for his declining platelet count.  UPEP on 09/05/2017 revealed 418 mg/24 hours of protein.  M-spike was 2.1% (31 mg/24 hours).  Kappa free light chains were 2170, lambda free light chains were 17.80 with a ratio of 121.91 (2.04-10.37).  Immunofixation revealed IgA monoclonal protein with kappa light chain specificity.    Bone marrow on 10/07/2017 revealed hypercellular bone marrow for age with plasma cell neoplasms. 18% of the sample was plasma cells.  Symptomatically, patient is doing "ok". He notes that he has increased bruising to his arms. Patient has pain in his back and RIGHT shoulder. Patient has has recent (09/26/2017) plain films of his RIGHT scapula and ribs that revealed no acute abnormalities. Patient notes that the pain in his back is "no different" than it has been for the last year and a half. Pain prevents patient from going to dialysis at times. Patient has some edema in his feet. Patient experiences orthopnea. He has been on hemodialysis, MWF, since 2015.  Patient has a lesion to the RIGHT side of his face. 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 Patient was referred to ENT. He has been seen in consult by Dr. Vaught, who advised that he could not biopsy the area. Patient was scheduled to follow up in March to assess for progression.   Patient denies B symptoms and recent infections. Patient is eating well, with no  demonstrated weight loss.    Past Medical History:  Diagnosis Date  . Cancer (HCC)   . Chronic kidney disease   . COPD (chronic obstructive pulmonary disease) (HCC)   . Coronary artery disease   . GERD (gastroesophageal reflux disease)   . Multiple myeloma (HCC)   . Myocardial infarction (HCC) 2005  . Neuropathy   . Shortness of breath dyspnea   . Stroke (cerebrum) (HCC)    weakness Lt hand  . Systolic CHF (HCC)     Past Surgical History:  Procedure Laterality Date  . APPENDECTOMY    . AV FISTULA PLACEMENT Left   . CARDIAC CATHETERIZATION N/A 11/15/2016   Procedure: Left Heart Cath and Coronary Angiography;  Surgeon: Alexander Paraschos, MD;  Location: [facility] INVASIVE CV LAB;  Service: Cardiovascular;  Laterality: N/A;  . CHOLECYSTECTOMY    . SHOULDER ARTHROSCOPY WITH OPEN ROTATOR CUFF REPAIR Left 09/19/2015   Procedure: SHOULDER ARTHROSCOPY , subacromial decompression, debridement;  Surgeon: John J Poggi, MD;  Location: [facility] ORS;  Service: Orthopedics;  Laterality: Left;  . TOTAL HIP ARTHROPLASTY Left     Family History  Problem Relation Age of Onset  . Alzheimer's disease Mother   . Kidney failure Father   . Bone cancer Sister   . Stomach cancer Sister     Social History:  reports that he has been smoking cigarettes.  He has been smoking about 0.50 packs per day. he has never used smokeless tobacco. He reports that he does not drink alcohol   or use drugs.  He began smoking at age 70. He smokes 20 packs per month (2 cartons). He has a 30-40 pack year smoking history.  The patient is accompanied by his wife, Frank Huang, today.  Allergies: No Known Allergies  Current Medications: Current Outpatient Medications  Medication Sig Dispense Refill  . aspirin EC 81 MG tablet Take 81 mg by mouth daily.    Marland Kitchen donepezil (ARICEPT) 10 MG tablet TAKE 1 TABLET BY MOUTH AT  BEDTIME 90 tablet 3  . furosemide (LASIX) 80 MG tablet Take 1 tablet (80 mg total) by mouth every Tuesday, Thursday,  Saturday, and Sunday. 30 tablet 0  . lidocaine-prilocaine (EMLA) cream Apply 1 application topically daily as needed.     . lovastatin (MEVACOR) 40 MG tablet TAKE 1 TABLET BY MOUTH EVERY DAY FOR HIGH CHOLESTEROL    . metoprolol tartrate (LOPRESSOR) 25 MG tablet Take 0.5 tablets (12.5 mg total) by mouth 2 (two) times daily. 60 tablet 0  . Multiple Vitamin (MULTI-VITAMINS) TABS Take 1 tablet by mouth daily. Reported on 04/01/2016    . NON FORMULARY Dialysis 3 times a week    . omeprazole (PRILOSEC) 20 MG capsule TAKE 1 CAPSULE BY MOUTH  DAILY. 90 capsule 3  . RENVELA 800 MG tablet Take 800 mg by mouth 3 (three) times daily.    . sucralfate (CARAFATE) 1 g tablet Take 1 tablet (1 g total) by mouth 4 (four) times daily -  before meals and at bedtime. 120 tablet 11  . traMADol (ULTRAM) 50 MG tablet Take 1 tablet (50 mg total) every 12 (twelve) hours as needed by mouth. 60 tablet 0  . vitamin B-12 (CYANOCOBALAMIN) 1000 MCG tablet Take 1,000 mcg by mouth daily.     No current facility-administered medications for this visit.     Review of Systems:  GENERAL:  Feels "alright".  No fevers or sweats.  Weight down 1 pound. PERFORMANCE STATUS (ECOG):  1 HEENT:  No visual changes, runny nose, sore throat, mouth sores or tenderness. Lungs: No shortness of breath or cough.  No hemoptysis. Cardiac:  No current chest pain, palpitations, orthopnea, or PND.  Interval chest pain evaluation (see HPI). GI:  No nausea, vomiting, diarrhea, constipation, melena or hematochezia. GU:  Dialysis every MWF, sometimes misses.  No urgency, frequency, dysuria, or hematuria. Musculoskeletal:  Left hip discomfort s/p replacement.  Back pain (chronic).  Shoulder pain.  Right knee issues.  No muscle tenderness. Extremities:  No pain or swelling. Skin:  Bruises easily on baby aspirin (no change).  No rashes or skin changes. Neuro:  Left foot numbness. No headache,weakness, balance or coordination issues. Endocrine:  No diabetes,  thyroid issues, hot flashes or night sweats. Psych:  No mood changes, depression or anxiety. Pain:  No focal pain. Review of systems:  All other systems reviewed and found to be negative.  Physical Exam: Blood pressure (!) 94/53, pulse 67, temperature (!) 95.4 F (35.2 C), temperature source Tympanic, weight 192 lb 3.9 oz (87.2 kg). GENERAL:  Well developed, well nourished, elderly gentleman sitting comfortably in the exam room in no acute distress. MENTAL STATUS:  Alert and oriented to person, place and time. HEAD:  Thin gray hair.  Male pattern baldness.  Normocephalic, atraumatic, face symmetric, no Cushingoid features. EYES:  Silver rimmed glasses.  Blue eyes.  Pupils equal round and reactive to light and accomodation.  No conjunctivitis or scleral icterus. ENT:  Oropharynx clear without lesion.  Tongue normal.  Dentures.  Mucous membranes moist.  RESPIRATORY:  Clear to auscultation without rales, wheezes or rhonchi. CARDIOVASCULAR:  Regular rate and rhythm without murmur, rub or gallop. ABDOMEN:  Soft, non-tender, with active bowel sounds, and no hepatosplenomegaly.  No masses. SKIN:  Multiple moles.  No rashes, ulcers or lesions. EXTREMITIES: Chronic bilateral lower extremity edema.  No skin discoloration.  No palpable cords. LYMPH NODES:  No palpable cervical, supraclavicular, axillary or inguinal adenopathy  NEUROLOGICAL: Unremarkable. PSYCH:  Appropriate.   Appointment on 10/22/2017  Component Date Value Ref Range Status  . Sodium 10/22/2017 138  135 - 145 mmol/L Final  . Potassium 10/22/2017 3.5  3.5 - 5.1 mmol/L Final  . Chloride 10/22/2017 97* 101 - 111 mmol/L Final  . CO2 10/22/2017 32  22 - 32 mmol/L Final  . Glucose, Bld 10/22/2017 90  65 - 99 mg/dL Final  . BUN 10/22/2017 26* 6 - 20 mg/dL Final  . Creatinine, Ser 10/22/2017 3.64* 0.61 - 1.24 mg/dL Final  . Calcium 10/22/2017 8.4* 8.9 - 10.3 mg/dL Final  . Total Protein 10/22/2017 8.1  6.5 - 8.1 g/dL Final  . Albumin  10/22/2017 3.7  3.5 - 5.0 g/dL Final  . AST 10/22/2017 16  15 - 41 U/L Final  . ALT 10/22/2017 11* 17 - 63 U/L Final  . Alkaline Phosphatase 10/22/2017 75  38 - 126 U/L Final  . Total Bilirubin 10/22/2017 1.0  0.3 - 1.2 mg/dL Final  . GFR calc non Af Amer 10/22/2017 14* >60 mL/min Final  . GFR calc Af Amer 10/22/2017 16* >60 mL/min Final   Comment: (NOTE) The eGFR has been calculated using the CKD EPI equation. This calculation has not been validated in all clinical situations. eGFR's persistently <60 mL/min signify possible Chronic Kidney Disease.   . Anion gap 10/22/2017 9  5 - 15 Final  . WBC 10/22/2017 4.3  3.8 - 10.6 K/uL Final  . RBC 10/22/2017 3.36* 4.40 - 5.90 MIL/uL Final  . Hemoglobin 10/22/2017 11.0* 13.0 - 18.0 g/dL Final  . HCT 10/22/2017 33.5* 40.0 - 52.0 % Final  . MCV 10/22/2017 99.8  80.0 - 100.0 fL Final  . MCH 10/22/2017 32.6  26.0 - 34.0 pg Final  . MCHC 10/22/2017 32.7  32.0 - 36.0 g/dL Final  . RDW 10/22/2017 16.3* 11.5 - 14.5 % Final  . Platelets 10/22/2017 88* 150 - 440 K/uL Final  . Neutrophils Relative % 10/22/2017 72  % Final  . Neutro Abs 10/22/2017 3.1  1.4 - 6.5 K/uL Final  . Lymphocytes Relative 10/22/2017 18  % Final  . Lymphs Abs 10/22/2017 0.8* 1.0 - 3.6 K/uL Final  . Monocytes Relative 10/22/2017 5  % Final  . Monocytes Absolute 10/22/2017 0.2  0.2 - 1.0 K/uL Final  . Eosinophils Relative 10/22/2017 4  % Final  . Eosinophils Absolute 10/22/2017 0.2  0 - 0.7 K/uL Final  . Basophils Relative 10/22/2017 1  % Final  . Basophils Absolute 10/22/2017 0.0  0 - 0.1 K/uL Final    Assessment:  THERMAN HUGHLETT is a 83 y.o. 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  Bone marrow revealed 20% plasma cells. He was treated with thalidomide for 7 months then discontinued in 06/2005 secondary to rash and depression. He was treated briefly with Revlimid in 2013 which was also discontinued secondary to rash.     Bone marrow on 10/07/2017 revealed hypercellular bone marrow for age with plasma cell neoplasms. 18% of the sample was plasma  cells.  M spike has been followed: 0.5 gm/dL on 06/15/2013, 0.8 gm/dL on 01/19/2014, 0.5 gm/dL on 11/22/2014, 0.9 on 06/28/2015, 0.7 on 09/20/2015, 0.6 on 01/02/2016, 0.8 on 04/16/2016, 0.6 on 08/21/2016, 0.6 on 11/20/2016, 0.6 on 02/19/2017, 0.9 on 05/21/2017, and 0.7 on 07/09/2017.  Kappa free light chains were 512 in 06/15/2013, 686 11/02/2013, 929 on 01/19/2014, 1212 in 05/10/2014, 1409 on 11/22/2014, 1119 on 02/28/2015, 851 (ratio 63.84) on 06/28/2015, 1006 (ratio 74.50) on 09/20/2015, 1133 (ratio 59.94) on 01/02/2016, 785.6 (ratio 24.32) on 04/16/2016, 834.9 (ratio 19.19) on 08/21/2016, 627 (ratio 24.4) on 02/19/2017, 822.5 (ratio 31.27) on 05/21/2017.  UPEP on 09/05/2017 revealed 418 mg/24 hours of protein.  M-spike was 2.1% (31 mg/24 hours).  Kappa free light chains were 2170, lambda free light chains were 17.80 with a ratio of 121.91 (2.04-10.37).  Immunofixation revealed IgA monoclonal protein with kappa light chain specificity.   He has subsequently been followed off therapy.  He had a hip fracture in in 12/2013.  Per notes, pathology revealed no myeloma.  Bone survey on 01/17/2015 revealed a 9 mm lytic lesion in the frontoparietal region (previously 7 mm). Bone survey on 04/11/2016 revealed posterior parietal region of the skull more prominent.  There was diffuse osteopenia. Bone survey on 05/29/2017 revealed no aggressive lytic or sclerotic osseous lesion of the axial and appendicular skeleton.  He has end-stage renal disease unrelated to his myeloma.  He has been on dialysis since 11/2013 (MWF).   He has anemia due to chronic renal insufficiency.  He has a history of iron deficiency. He had a negative colonoscopy and upper endoscopy in 08/2004. Notes indicate he also had a small bowel capsule study. EGD and colonoscopy in 2014 revealed gastric lesions which were  cauterized. He receives IV iron and Procrit with dialysis.  Ferritin is elevated (? acute phase reactant as ESR elevated).  Ferritin was 1121 on 07/05/2015 and 743 on 01/02/2016.  Iron saturation was 30% on 07/05/2015.  He has B12 deficiency.  B12 was 120 on 07/05/2015.  Folate was 10.8.  He was on B12 IM monthy (last on 08/02/2016 with primary care).  He is on oral B12.  B12 was 728 and folate 17.8 on 05/28/2017.   He has fluctuating thrombocytopenia.  Platelet count was normal on 11/14/2016.  Platelet count has ranged between 74,000 - 115,000 in the last 4 months without trend.  Normal studies on 05/28/2017 included: hepatitis B and C testing.  ANA was + with reflex -. He is on no new medications or herbal products.  He has a history of left shoulder discomfort.  MRI on 06/14/2015 revealed a tendinopathy, osteoarthritis, and capsulitis.   Abdomen and pelvic CT scan on 04/30/2016 revealed a 1.3 cm irregularly marginated nodular lesion in the anterior left lower lobe. There was a subtle area of slightly diminished attenuation near the tail of the pancreas of uncertain significance. There were multiple renal lesions (some did not represent simple cysts).   Chest CT on 05/03/2016 revealed a 1.05 cm ill-defined slightly spiculated irregular nodule in the left lower lobe as well as a 0.8 cm nodule in the right lower lobe.  PET scan on 05/16/2016 revealed no evidence of active skeletal multiple myeloma or plasmacytoma.  The LEFT lower lobe pulmonary nodule was 9 mm and had mild metabolic activity (SUV 1.5).  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  Chest CT on   08/20/2016 revealed interval resolution of bilateral lower lobe nodules c/w inflammatory or infectious process.  Ultrasound-guided biopsy of the right parotid nodule on 06/18/2016 was unsuccessful. He was seen by Dr. Creighton  Vaught on 06/24/2016.  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 He is followed by Dr. Vaught. Repeat soft tissue ultrasound on 06/30/2017 revealed a stable 1.6 cm nodule.   He had a myocardial infarction on 11/14/2016.  He is being medically managed.  Abdominal ultrasound on 07/22/2017 revealed a normal appearing liver and spleen (7.7 cm). There was no focal lesion identified in the liver with normal direction blood flow.  Symptomatically, he feels fine.  Patient has chronic pain in his RIGHT shoulder and back. Exam is stable.  Hematocrit is 33.5, hemoglobin 11.0, and platelets 88,000. Potassium is low at 3.0. Renal function stable with a BUN of 26 and a creatinine of 3.64 (CrCl 18.4).   Plan: 1.  Labs today: CBC with diff, CMP, SPEP, FLCA 2.  Review bone marrow.  Plasma cells are stable at 18%.  Phone follow-up with pathology.  Discuss continued observation. 3.  Obtain results from Dr. Vaught's office regarding RIGHT parotid neoplasm.  ROI signed. 4.  RTC in 3 months for MD assessment and labs (CBC with diff, CMP, SPEP, free light chains).   Bryan Gray, NP  10/22/2017, 12:22 PM   I saw and evaluated the patient, participating in the key portions of the service and reviewing pertinent diagnostic studies and records.  I reviewed the nurse practitioner's note and agree with the findings and the plan.  The assessment and plan were discussed with the patient.  A few questions were asked by the patient and answered.   Bryan Gray, NP  10/22/2017,12:22 PM   

## 2017-10-23 LAB — KAPPA/LAMBDA LIGHT CHAINS
Kappa free light chain: 888.3 mg/L — ABNORMAL HIGH (ref 3.3–19.4)
Kappa, lambda light chain ratio: 30.21 — ABNORMAL HIGH (ref 0.26–1.65)
Lambda free light chains: 29.4 mg/L — ABNORMAL HIGH (ref 5.7–26.3)

## 2017-10-24 DIAGNOSIS — D509 Iron deficiency anemia, unspecified: Secondary | ICD-10-CM | POA: Diagnosis not present

## 2017-10-24 DIAGNOSIS — D631 Anemia in chronic kidney disease: Secondary | ICD-10-CM | POA: Diagnosis not present

## 2017-10-24 DIAGNOSIS — N2581 Secondary hyperparathyroidism of renal origin: Secondary | ICD-10-CM | POA: Diagnosis not present

## 2017-10-24 DIAGNOSIS — N186 End stage renal disease: Secondary | ICD-10-CM | POA: Diagnosis not present

## 2017-10-24 DIAGNOSIS — D689 Coagulation defect, unspecified: Secondary | ICD-10-CM | POA: Diagnosis not present

## 2017-10-24 LAB — PROTEIN ELECTROPHORESIS, SERUM
A/G Ratio: 1.3 (ref 0.7–1.7)
Albumin ELP: 4 g/dL (ref 2.9–4.4)
Alpha-1-Globulin: 0.3 g/dL (ref 0.0–0.4)
Alpha-2-Globulin: 0.7 g/dL (ref 0.4–1.0)
Beta Globulin: 1.7 g/dL — ABNORMAL HIGH (ref 0.7–1.3)
Gamma Globulin: 0.4 g/dL (ref 0.4–1.8)
Globulin, Total: 3.1 g/dL (ref 2.2–3.9)
M-Spike, %: 0.7 g/dL — ABNORMAL HIGH
Total Protein ELP: 7.1 g/dL (ref 6.0–8.5)

## 2017-10-26 ENCOUNTER — Encounter: Payer: Self-pay | Admitting: Hematology and Oncology

## 2017-10-27 DIAGNOSIS — D689 Coagulation defect, unspecified: Secondary | ICD-10-CM | POA: Diagnosis not present

## 2017-10-27 DIAGNOSIS — D509 Iron deficiency anemia, unspecified: Secondary | ICD-10-CM | POA: Diagnosis not present

## 2017-10-27 DIAGNOSIS — N2581 Secondary hyperparathyroidism of renal origin: Secondary | ICD-10-CM | POA: Diagnosis not present

## 2017-10-27 DIAGNOSIS — N186 End stage renal disease: Secondary | ICD-10-CM | POA: Diagnosis not present

## 2017-10-27 DIAGNOSIS — D631 Anemia in chronic kidney disease: Secondary | ICD-10-CM | POA: Diagnosis not present

## 2017-10-29 ENCOUNTER — Other Ambulatory Visit: Payer: Medicare Other

## 2017-10-29 ENCOUNTER — Ambulatory Visit: Payer: Medicare Other | Admitting: Hematology and Oncology

## 2017-10-29 DIAGNOSIS — D509 Iron deficiency anemia, unspecified: Secondary | ICD-10-CM | POA: Diagnosis not present

## 2017-10-29 DIAGNOSIS — D689 Coagulation defect, unspecified: Secondary | ICD-10-CM | POA: Diagnosis not present

## 2017-10-29 DIAGNOSIS — D631 Anemia in chronic kidney disease: Secondary | ICD-10-CM | POA: Diagnosis not present

## 2017-10-29 DIAGNOSIS — N186 End stage renal disease: Secondary | ICD-10-CM | POA: Diagnosis not present

## 2017-10-29 DIAGNOSIS — N2581 Secondary hyperparathyroidism of renal origin: Secondary | ICD-10-CM | POA: Diagnosis not present

## 2017-10-30 ENCOUNTER — Encounter (HOSPITAL_COMMUNITY): Payer: Self-pay

## 2017-10-30 LAB — TISSUE HYBRIDIZATION (BONE MARROW)-NCBH

## 2017-10-30 LAB — CHROMOSOME ANALYSIS, BONE MARROW

## 2017-10-31 DIAGNOSIS — D631 Anemia in chronic kidney disease: Secondary | ICD-10-CM | POA: Diagnosis not present

## 2017-10-31 DIAGNOSIS — N2581 Secondary hyperparathyroidism of renal origin: Secondary | ICD-10-CM | POA: Diagnosis not present

## 2017-10-31 DIAGNOSIS — D509 Iron deficiency anemia, unspecified: Secondary | ICD-10-CM | POA: Diagnosis not present

## 2017-10-31 DIAGNOSIS — N186 End stage renal disease: Secondary | ICD-10-CM | POA: Diagnosis not present

## 2017-10-31 DIAGNOSIS — D689 Coagulation defect, unspecified: Secondary | ICD-10-CM | POA: Diagnosis not present

## 2017-11-02 DIAGNOSIS — D631 Anemia in chronic kidney disease: Secondary | ICD-10-CM | POA: Diagnosis not present

## 2017-11-02 DIAGNOSIS — D689 Coagulation defect, unspecified: Secondary | ICD-10-CM | POA: Diagnosis not present

## 2017-11-02 DIAGNOSIS — N186 End stage renal disease: Secondary | ICD-10-CM | POA: Diagnosis not present

## 2017-11-02 DIAGNOSIS — D509 Iron deficiency anemia, unspecified: Secondary | ICD-10-CM | POA: Diagnosis not present

## 2017-11-02 DIAGNOSIS — N2581 Secondary hyperparathyroidism of renal origin: Secondary | ICD-10-CM | POA: Diagnosis not present

## 2017-11-05 DIAGNOSIS — D631 Anemia in chronic kidney disease: Secondary | ICD-10-CM | POA: Diagnosis not present

## 2017-11-05 DIAGNOSIS — D509 Iron deficiency anemia, unspecified: Secondary | ICD-10-CM | POA: Diagnosis not present

## 2017-11-05 DIAGNOSIS — N186 End stage renal disease: Secondary | ICD-10-CM | POA: Diagnosis not present

## 2017-11-05 DIAGNOSIS — D689 Coagulation defect, unspecified: Secondary | ICD-10-CM | POA: Diagnosis not present

## 2017-11-05 DIAGNOSIS — N2581 Secondary hyperparathyroidism of renal origin: Secondary | ICD-10-CM | POA: Diagnosis not present

## 2017-11-07 DIAGNOSIS — D631 Anemia in chronic kidney disease: Secondary | ICD-10-CM | POA: Diagnosis not present

## 2017-11-07 DIAGNOSIS — D689 Coagulation defect, unspecified: Secondary | ICD-10-CM | POA: Diagnosis not present

## 2017-11-07 DIAGNOSIS — N186 End stage renal disease: Secondary | ICD-10-CM | POA: Diagnosis not present

## 2017-11-07 DIAGNOSIS — D509 Iron deficiency anemia, unspecified: Secondary | ICD-10-CM | POA: Diagnosis not present

## 2017-11-07 DIAGNOSIS — N2581 Secondary hyperparathyroidism of renal origin: Secondary | ICD-10-CM | POA: Diagnosis not present

## 2017-11-09 DIAGNOSIS — D689 Coagulation defect, unspecified: Secondary | ICD-10-CM | POA: Diagnosis not present

## 2017-11-09 DIAGNOSIS — N2581 Secondary hyperparathyroidism of renal origin: Secondary | ICD-10-CM | POA: Diagnosis not present

## 2017-11-09 DIAGNOSIS — N186 End stage renal disease: Secondary | ICD-10-CM | POA: Diagnosis not present

## 2017-11-09 DIAGNOSIS — D509 Iron deficiency anemia, unspecified: Secondary | ICD-10-CM | POA: Diagnosis not present

## 2017-11-09 DIAGNOSIS — D631 Anemia in chronic kidney disease: Secondary | ICD-10-CM | POA: Diagnosis not present

## 2017-11-10 DIAGNOSIS — I120 Hypertensive chronic kidney disease with stage 5 chronic kidney disease or end stage renal disease: Secondary | ICD-10-CM | POA: Diagnosis not present

## 2017-11-10 DIAGNOSIS — Z992 Dependence on renal dialysis: Secondary | ICD-10-CM | POA: Diagnosis not present

## 2017-11-10 DIAGNOSIS — N186 End stage renal disease: Secondary | ICD-10-CM | POA: Diagnosis not present

## 2017-11-12 DIAGNOSIS — D689 Coagulation defect, unspecified: Secondary | ICD-10-CM | POA: Diagnosis not present

## 2017-11-12 DIAGNOSIS — D631 Anemia in chronic kidney disease: Secondary | ICD-10-CM | POA: Diagnosis not present

## 2017-11-12 DIAGNOSIS — E8779 Other fluid overload: Secondary | ICD-10-CM | POA: Diagnosis not present

## 2017-11-12 DIAGNOSIS — D509 Iron deficiency anemia, unspecified: Secondary | ICD-10-CM | POA: Diagnosis not present

## 2017-11-12 DIAGNOSIS — N186 End stage renal disease: Secondary | ICD-10-CM | POA: Diagnosis not present

## 2017-11-12 DIAGNOSIS — N2581 Secondary hyperparathyroidism of renal origin: Secondary | ICD-10-CM | POA: Diagnosis not present

## 2017-11-14 DIAGNOSIS — N2581 Secondary hyperparathyroidism of renal origin: Secondary | ICD-10-CM | POA: Diagnosis not present

## 2017-11-14 DIAGNOSIS — D631 Anemia in chronic kidney disease: Secondary | ICD-10-CM | POA: Diagnosis not present

## 2017-11-14 DIAGNOSIS — D509 Iron deficiency anemia, unspecified: Secondary | ICD-10-CM | POA: Diagnosis not present

## 2017-11-14 DIAGNOSIS — N186 End stage renal disease: Secondary | ICD-10-CM | POA: Diagnosis not present

## 2017-11-14 DIAGNOSIS — E8779 Other fluid overload: Secondary | ICD-10-CM | POA: Diagnosis not present

## 2017-11-14 DIAGNOSIS — D689 Coagulation defect, unspecified: Secondary | ICD-10-CM | POA: Diagnosis not present

## 2017-11-17 DIAGNOSIS — N2581 Secondary hyperparathyroidism of renal origin: Secondary | ICD-10-CM | POA: Diagnosis not present

## 2017-11-17 DIAGNOSIS — D631 Anemia in chronic kidney disease: Secondary | ICD-10-CM | POA: Diagnosis not present

## 2017-11-17 DIAGNOSIS — D509 Iron deficiency anemia, unspecified: Secondary | ICD-10-CM | POA: Diagnosis not present

## 2017-11-17 DIAGNOSIS — D689 Coagulation defect, unspecified: Secondary | ICD-10-CM | POA: Diagnosis not present

## 2017-11-17 DIAGNOSIS — N186 End stage renal disease: Secondary | ICD-10-CM | POA: Diagnosis not present

## 2017-11-17 DIAGNOSIS — E8779 Other fluid overload: Secondary | ICD-10-CM | POA: Diagnosis not present

## 2017-11-19 DIAGNOSIS — D689 Coagulation defect, unspecified: Secondary | ICD-10-CM | POA: Diagnosis not present

## 2017-11-19 DIAGNOSIS — N186 End stage renal disease: Secondary | ICD-10-CM | POA: Diagnosis not present

## 2017-11-19 DIAGNOSIS — N2581 Secondary hyperparathyroidism of renal origin: Secondary | ICD-10-CM | POA: Diagnosis not present

## 2017-11-19 DIAGNOSIS — E8779 Other fluid overload: Secondary | ICD-10-CM | POA: Diagnosis not present

## 2017-11-19 DIAGNOSIS — D631 Anemia in chronic kidney disease: Secondary | ICD-10-CM | POA: Diagnosis not present

## 2017-11-19 DIAGNOSIS — D509 Iron deficiency anemia, unspecified: Secondary | ICD-10-CM | POA: Diagnosis not present

## 2017-11-20 ENCOUNTER — Ambulatory Visit: Payer: Medicare Other

## 2017-11-20 ENCOUNTER — Ambulatory Visit (INDEPENDENT_AMBULATORY_CARE_PROVIDER_SITE_OTHER): Payer: Medicare Other | Admitting: Family Medicine

## 2017-11-20 ENCOUNTER — Encounter: Payer: Self-pay | Admitting: Family Medicine

## 2017-11-20 ENCOUNTER — Other Ambulatory Visit: Payer: Self-pay

## 2017-11-20 VITALS — BP 108/50 | HR 76 | Temp 98.0°F | Resp 14 | Wt 197.6 lb

## 2017-11-20 DIAGNOSIS — I10 Essential (primary) hypertension: Secondary | ICD-10-CM

## 2017-11-20 DIAGNOSIS — N186 End stage renal disease: Secondary | ICD-10-CM | POA: Diagnosis not present

## 2017-11-20 DIAGNOSIS — Z0000 Encounter for general adult medical examination without abnormal findings: Secondary | ICD-10-CM

## 2017-11-20 DIAGNOSIS — F17210 Nicotine dependence, cigarettes, uncomplicated: Secondary | ICD-10-CM

## 2017-11-20 DIAGNOSIS — Z0001 Encounter for general adult medical examination with abnormal findings: Secondary | ICD-10-CM

## 2017-11-20 DIAGNOSIS — G3184 Mild cognitive impairment, so stated: Secondary | ICD-10-CM

## 2017-11-20 DIAGNOSIS — Z992 Dependence on renal dialysis: Secondary | ICD-10-CM | POA: Diagnosis not present

## 2017-11-20 DIAGNOSIS — E7849 Other hyperlipidemia: Secondary | ICD-10-CM

## 2017-11-20 DIAGNOSIS — Z716 Tobacco abuse counseling: Secondary | ICD-10-CM

## 2017-11-20 NOTE — Progress Notes (Signed)
Patient: Frank Huang, Male    DOB: 12-21-1931, 84 y.o.   MRN: [id number] Visit Date: 11/20/2017  Today's Provider: Wilhemena Durie, MD   Chief Complaint  Patient presents with  . Medicare Wellness   Subjective:   Frank Huang is a 84 y.o. male who presents today for his Subsequent Annual Wellness Visit. He feels fairly well. He reports exercising not at this time. He reports he is sleeping well. He continues to smoke.  Colonoscopy-patient says yes at [facility] get that record.  Last lab work-CBC and MetC on 10/22/2017   Lipid and TSH 11/15/16.  Review of Systems  Constitutional: Positive for fatigue.  HENT: Negative.   Eyes: Negative.   Respiratory: Negative.   Gastrointestinal: Negative.   Endocrine: Negative.   Genitourinary: Negative.   Musculoskeletal: Positive for back pain.  Skin: Negative.   Allergic/Immunologic: Negative.   Neurological: Negative.   Hematological: Negative.   Psychiatric/Behavioral: Positive for confusion.       Some memory loss    Patient Active Problem List   Diagnosis Date Noted  . Pain of right scapula 09/26/2017  . Thrombocytopenia ([place]) 03/19/2017  . Pneumonia 01/28/2017  . NSTEMI (non-ST elevated myocardial infarction) ([place]) 11/15/2016  . Chest pain, rule out acute myocardial infarction 11/14/2016  . Parotid mass 05/17/2016  . Nodule of right lung 05/11/2016  . Persistent atrial fibrillation ([place]) 02/05/2016  . B12 deficiency 07/25/2015  . Deficiency of vitamin B 07/25/2015  . Chest pain 07/01/2015  . Multiple myeloma ([place]) 04/12/2015  . Arteriosclerosis of coronary artery 04/09/2015  . CAFL (chronic airflow limitation) ([place]) 04/09/2015  . Chronic kidney disease requiring chronic dialysis ([place]) 04/09/2015  . Gastro-esophageal reflux disease without esophagitis 04/09/2015  . Gout 04/09/2015  . H/O acute myocardial infarction 04/09/2015  . HLD (hyperlipidemia) 04/09/2015  . BP (high blood pressure) 04/09/2015  . Bad memory  04/09/2015  . 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 Kidney failure 04/09/2015  . End-stage renal disease ([place]) 04/09/2015  . Chronic kidney disease (CKD), stage V ([place]) 07/20/2013  . Chronic kidney disease, stage V ([place]) 07/20/2013  . Absolute anemia 03/31/2013  . Neuropathy 03/31/2013    Social History   Socioeconomic History  . Marital status: Married    Spouse name: Not on file  . Number of children: Not on file  . Years of education: Not on file  . Highest education level: Not on file  Social Needs  . Financial resource strain: Not on file  . Food insecurity - worry: Not on file  . Food insecurity - inability: Not on file  . Transportation needs - medical: Not on file  . Transportation needs - non-medical: Not on file  Occupational History  . Not on file  Tobacco Use  . Smoking status: Current Every Day Smoker    Packs/day: 0.50    Types: Cigarettes  . Smokeless tobacco: Never Used  Substance and Sexual Activity  . Alcohol use: No    Alcohol/week: 0.0 oz  . Drug use: No  . Sexual activity: No  Other Topics Concern  . Not on file  Social History Narrative  . Not on file    Past Surgical History:  Procedure Laterality Date  . APPENDECTOMY    . AV FISTULA PLACEMENT Left   . CARDIAC CATHETERIZATION N/A 11/15/2016   Procedure: Left Heart Cath and Coronary Angiography;  Surgeon: Isaias Cowman, MD;  Location: [facility] CV LAB;  Service: Cardiovascular;  Laterality: N/A;  . CHOLECYSTECTOMY    . SHOULDER ARTHROSCOPY WITH OPEN ROTATOR CUFF REPAIR Left 09/19/2015   Procedure: SHOULDER ARTHROSCOPY , subacromial decompression, debridement;  Surgeon: Corky Mull, MD;  Location: [facility] ORS;  Service: Orthopedics;  Laterality: Left;  . TOTAL HIP ARTHROPLASTY Left     His family history includes Alzheimer's disease in his mother; Bone cancer in his sister; Kidney failure in his father; Stomach cancer in his sister.     Outpatient  Encounter Medications as of 11/20/2017  Medication Sig  . aspirin EC 81 MG tablet Take 81 mg by mouth daily.  Marland Kitchen donepezil (ARICEPT) 10 MG tablet TAKE 1 TABLET BY MOUTH AT  BEDTIME  . furosemide (LASIX) 80 MG tablet Take 1 tablet (80 mg total) by mouth every Tuesday, Thursday, Saturday, and Sunday.  . lidocaine-prilocaine (EMLA) cream Apply 1 application topically daily as needed.   . lovastatin (MEVACOR) 40 MG tablet TAKE 1 TABLET BY MOUTH EVERY DAY FOR HIGH CHOLESTEROL  . metoprolol tartrate (LOPRESSOR) 25 MG tablet Take 0.5 tablets (12.5 mg total) by mouth 2 (two) times daily.  . Multiple Vitamin (MULTI-VITAMINS) TABS Take 1 tablet by mouth daily. Reported on 04/01/2016  . NON FORMULARY Dialysis 3 times a week  . omeprazole (PRILOSEC) 20 MG capsule TAKE 1 CAPSULE BY MOUTH  DAILY.  Marland Kitchen RENVELA 800 MG tablet Take 800 mg by mouth 3 (three) times daily.  . sucralfate (CARAFATE) 1 g tablet Take 1 tablet (1 g total) by mouth 4 (four) times daily -  before meals and at bedtime.  . traMADol (ULTRAM) 50 MG tablet Take 1 tablet (50 mg total) every 12 (twelve) hours as needed by mouth.  . vitamin B-12 (CYANOCOBALAMIN) 1000 MCG tablet Take 1,000 mcg by mouth daily.   No facility-administered encounter medications on file as of 11/20/2017.     No Known Allergies  Patient Care Team: [name]., MD as PCP - General (Family Medicine) Justin Mend, MD as Attending Physician (Internal Medicine) Lavonia Dana, MD as Consulting Physician (Internal Medicine)   Objective:   Vitals:  Vitals:   11/20/17 1047  BP: (!) 108/50  Pulse: 76  Resp: 14  Temp: 98 F (36.7 C)  Weight: 197 lb 9.6 oz (89.6 kg)    Physical Exam  Constitutional: He appears well-developed and well-nourished.  HENT:  Head: Normocephalic and atraumatic.  Right Ear: External ear normal.  Left Ear: External ear normal.  Nose: Nose normal.  Eyes: Conjunctivae are normal. No scleral icterus.  Neck: No thyromegaly  present.  Cardiovascular: Normal rate, regular rhythm and normal heart sounds.  Pulmonary/Chest: Effort normal and breath sounds normal.  Occasional crackles.  Abdominal: Soft.  Musculoskeletal: He exhibits edema.  Trace edema.  Neurological: He is alert.  Skin: Skin is warm and dry.  Multiple SKs.  Psychiatric: He has a normal mood and affect. His behavior is normal. Judgment and thought content normal.    Activities of Daily Living In your present state of health, do you have any difficulty performing the following activities: 01/29/2017 01/28/2017  Hearing? - -  Vision? - -  Difficulty concentrating or making decisions? - -  Walking or climbing stairs? - -  Dressing or bathing? - -  Doing errands, shopping? N N  Some recent data might be hidden    Fall Risk Assessment Fall Risk  11/20/2017 02/04/2017 08/29/2015  Falls in the past year? No Yes No  Number falls in past yr: - 1 -  Injury with Fall? - No -     Depression Screen PHQ 2/9 Scores 11/20/2017 02/04/2017 08/29/2015  PHQ - 2 Score 0 0 0  PHQ- 9 Score - 3 -    Cognitive Testing - 6-CIT    Year: 0 4 points  Month: 0 3 points  Memorize "Pia Mau, [address], [place]"  Time (within 1 hour:) 0 3 points  Count backwards from 20: 0 2 4 points  Name months of year: 0 2 4 points  Repeat Address: 0 2 4 6 8 10  points   Total Score: 10/28  Interpretation : Normal (0-7) Abnormal (8-28)    Assessment & Plan:     Annual Wellness Visit  Reviewed patient's Family Medical History Reviewed and updated list of patient's medical providers Assessment of cognitive impairment was done Assessed patient's functional ability Established a written schedule for health screening [place] Completed and Reviewed  1. Medicare annual wellness visit, subsequent  2. MCI (mild cognitive impairment) with memory loss  3. Chronic kidney disease requiring chronic dialysis ([place])  4. Essential hypertension -  TSH  5. Other hyperlipidemia - Lipid Panel With LDL/HDL Ratio  6. Tobacco abuse counseling Pt advised to quit.  HPI, Exam and A&P transcribed by Tiffany Kocher, RMA under direction and in the presence of Miguel Aschoff, MD. I have done the exam and reviewed the chart and it is accurate to the best of my knowledge. Development worker, community has been used and  any errors in dictation or transcription are unintentional. Miguel Aschoff M.D. [facility]

## 2017-11-21 LAB — LIPID PANEL WITH LDL/HDL RATIO
CHOLESTEROL TOTAL: 115 mg/dL (ref 100–199)
HDL: 45 mg/dL (ref >39)
LDL CALC: 60 mg/dL (ref 0–99)
LDL/HDL RATIO: 1.3 ratio (ref 0.0–3.6)
TRIGLYCERIDES: 49 mg/dL (ref 0–149)
VLDL CHOLESTEROL CAL: 10 mg/dL (ref 5–40)

## 2017-11-21 LAB — TSH: TSH: 1.81 u[IU]/mL (ref 0.450–4.500)

## 2017-11-22 DIAGNOSIS — E8779 Other fluid overload: Secondary | ICD-10-CM | POA: Diagnosis not present

## 2017-11-22 DIAGNOSIS — N186 End stage renal disease: Secondary | ICD-10-CM | POA: Diagnosis not present

## 2017-11-22 DIAGNOSIS — D689 Coagulation defect, unspecified: Secondary | ICD-10-CM | POA: Diagnosis not present

## 2017-11-22 DIAGNOSIS — D509 Iron deficiency anemia, unspecified: Secondary | ICD-10-CM | POA: Diagnosis not present

## 2017-11-22 DIAGNOSIS — D631 Anemia in chronic kidney disease: Secondary | ICD-10-CM | POA: Diagnosis not present

## 2017-11-22 DIAGNOSIS — N2581 Secondary hyperparathyroidism of renal origin: Secondary | ICD-10-CM | POA: Diagnosis not present

## 2017-11-24 ENCOUNTER — Telehealth: Payer: Self-pay

## 2017-11-24 DIAGNOSIS — D689 Coagulation defect, unspecified: Secondary | ICD-10-CM | POA: Diagnosis not present

## 2017-11-24 DIAGNOSIS — D509 Iron deficiency anemia, unspecified: Secondary | ICD-10-CM | POA: Diagnosis not present

## 2017-11-24 DIAGNOSIS — D631 Anemia in chronic kidney disease: Secondary | ICD-10-CM | POA: Diagnosis not present

## 2017-11-24 DIAGNOSIS — N2581 Secondary hyperparathyroidism of renal origin: Secondary | ICD-10-CM | POA: Diagnosis not present

## 2017-11-24 DIAGNOSIS — E8779 Other fluid overload: Secondary | ICD-10-CM | POA: Diagnosis not present

## 2017-11-24 DIAGNOSIS — N186 End stage renal disease: Secondary | ICD-10-CM | POA: Diagnosis not present

## 2017-11-24 NOTE — Telephone Encounter (Signed)
Spoke to [employer] who is on the [facility] and advised as directed below.  Thanks,  -Salah Burlison

## 2017-11-24 NOTE — Telephone Encounter (Signed)
-----   Message from [name]., MD sent at 11/24/2017  2:07 PM EST ----- Good.

## 2017-11-26 DIAGNOSIS — D689 Coagulation defect, unspecified: Secondary | ICD-10-CM | POA: Diagnosis not present

## 2017-11-26 DIAGNOSIS — E8779 Other fluid overload: Secondary | ICD-10-CM | POA: Diagnosis not present

## 2017-11-26 DIAGNOSIS — D509 Iron deficiency anemia, unspecified: Secondary | ICD-10-CM | POA: Diagnosis not present

## 2017-11-26 DIAGNOSIS — N186 End stage renal disease: Secondary | ICD-10-CM | POA: Diagnosis not present

## 2017-11-26 DIAGNOSIS — N2581 Secondary hyperparathyroidism of renal origin: Secondary | ICD-10-CM | POA: Diagnosis not present

## 2017-11-26 DIAGNOSIS — D631 Anemia in chronic kidney disease: Secondary | ICD-10-CM | POA: Diagnosis not present

## 2017-11-28 DIAGNOSIS — D689 Coagulation defect, unspecified: Secondary | ICD-10-CM | POA: Diagnosis not present

## 2017-11-28 DIAGNOSIS — N186 End stage renal disease: Secondary | ICD-10-CM | POA: Diagnosis not present

## 2017-11-28 DIAGNOSIS — E8779 Other fluid overload: Secondary | ICD-10-CM | POA: Diagnosis not present

## 2017-11-28 DIAGNOSIS — D509 Iron deficiency anemia, unspecified: Secondary | ICD-10-CM | POA: Diagnosis not present

## 2017-11-28 DIAGNOSIS — N2581 Secondary hyperparathyroidism of renal origin: Secondary | ICD-10-CM | POA: Diagnosis not present

## 2017-11-28 DIAGNOSIS — D631 Anemia in chronic kidney disease: Secondary | ICD-10-CM | POA: Diagnosis not present

## 2017-12-01 ENCOUNTER — Ambulatory Visit (INDEPENDENT_AMBULATORY_CARE_PROVIDER_SITE_OTHER): Payer: Medicare Other | Admitting: Family Medicine

## 2017-12-01 ENCOUNTER — Other Ambulatory Visit: Payer: Self-pay | Admitting: Hematology and Oncology

## 2017-12-01 ENCOUNTER — Telehealth: Payer: Self-pay | Admitting: *Deleted

## 2017-12-01 ENCOUNTER — Inpatient Hospital Stay: Payer: Medicare Other | Attending: Hematology and Oncology

## 2017-12-01 VITALS — BP 98/42 | HR 77 | Temp 97.8°F | Resp 14 | Wt 197.0 lb

## 2017-12-01 DIAGNOSIS — C9000 Multiple myeloma not having achieved remission: Secondary | ICD-10-CM

## 2017-12-01 DIAGNOSIS — R040 Epistaxis: Secondary | ICD-10-CM

## 2017-12-01 DIAGNOSIS — D631 Anemia in chronic kidney disease: Secondary | ICD-10-CM | POA: Insufficient documentation

## 2017-12-01 DIAGNOSIS — D509 Iron deficiency anemia, unspecified: Secondary | ICD-10-CM | POA: Diagnosis not present

## 2017-12-01 DIAGNOSIS — E8779 Other fluid overload: Secondary | ICD-10-CM | POA: Diagnosis not present

## 2017-12-01 DIAGNOSIS — Z992 Dependence on renal dialysis: Secondary | ICD-10-CM | POA: Insufficient documentation

## 2017-12-01 DIAGNOSIS — N186 End stage renal disease: Secondary | ICD-10-CM | POA: Diagnosis not present

## 2017-12-01 DIAGNOSIS — R58 Hemorrhage, not elsewhere classified: Secondary | ICD-10-CM | POA: Diagnosis not present

## 2017-12-01 DIAGNOSIS — D696 Thrombocytopenia, unspecified: Secondary | ICD-10-CM

## 2017-12-01 DIAGNOSIS — E538 Deficiency of other specified B group vitamins: Secondary | ICD-10-CM | POA: Diagnosis not present

## 2017-12-01 DIAGNOSIS — D689 Coagulation defect, unspecified: Secondary | ICD-10-CM | POA: Diagnosis not present

## 2017-12-01 DIAGNOSIS — K118 Other diseases of salivary glands: Secondary | ICD-10-CM | POA: Diagnosis not present

## 2017-12-01 DIAGNOSIS — I252 Old myocardial infarction: Secondary | ICD-10-CM | POA: Insufficient documentation

## 2017-12-01 DIAGNOSIS — N2581 Secondary hyperparathyroidism of renal origin: Secondary | ICD-10-CM | POA: Diagnosis not present

## 2017-12-01 LAB — COMPREHENSIVE METABOLIC PANEL
ALT: 11 U/L — ABNORMAL LOW (ref 17–63)
AST: 16 U/L (ref 15–41)
Albumin: 3.4 g/dL — ABNORMAL LOW (ref 3.5–5.0)
Alkaline Phosphatase: 73 U/L (ref 38–126)
Anion gap: 12 (ref 5–15)
BUN: 45 mg/dL — ABNORMAL HIGH (ref 6–20)
CO2: 29 mmol/L (ref 22–32)
Calcium: 9 mg/dL (ref 8.9–10.3)
Chloride: 96 mmol/L — ABNORMAL LOW (ref 101–111)
Creatinine, Ser: 6.03 mg/dL — ABNORMAL HIGH (ref 0.61–1.24)
GFR calc Af Amer: 9 mL/min — ABNORMAL LOW (ref >60)
GFR calc non Af Amer: 8 mL/min — ABNORMAL LOW (ref >60)
Glucose, Bld: 93 mg/dL (ref 65–99)
Potassium: 3.7 mmol/L (ref 3.5–5.1)
Sodium: 137 mmol/L (ref 135–145)
Total Bilirubin: 0.9 mg/dL (ref 0.3–1.2)
Total Protein: 6.9 g/dL (ref 6.5–8.1)

## 2017-12-01 LAB — PROTIME-INR
INR: 1.02
Prothrombin Time: 13.3 seconds (ref 11.4–15.2)

## 2017-12-01 LAB — APTT: aPTT: 29 seconds (ref 24–36)

## 2017-12-01 NOTE — Telephone Encounter (Signed)
In coming referral for an already established patient received from PCP office for bleeding episodes more than twice in less than a week. Discussed with Dr Mike Gip and labs ordered and to see patient this week. Patient accepts appointment at 73 today for lab and Thursday to see MD

## 2017-12-01 NOTE — Progress Notes (Signed)
Frank Huang  MRN: [id number] DOB: 1932/06/01  Subjective:  HPI  Patient is here to evaluate abnormal bleeding he has had.  On Friday 11/28/17 wife noticed patient's left eye was black bruised and asked what happened and patient said he just rubbed it. No pain. Then Saturday 11/29/17 patient has had slight nosebleed all day. Then today Monday 12/01/17 went to dialysis and when the IV was put in he started to bleed around the needle. Patient states he feels fine and has felt fine no different then before. Last CBC was checked in December 2018 per our record.  Patient Active Problem List   Diagnosis Date Noted  . Pain of right scapula 09/26/2017  . Thrombocytopenia ([place]) 03/19/2017  . Pneumonia 01/28/2017  . NSTEMI (non-ST elevated myocardial infarction) ([place]) 11/15/2016  . Chest pain, rule out acute myocardial infarction 11/14/2016  . Parotid mass 05/17/2016  . Nodule of right lung 05/11/2016  . Persistent atrial fibrillation ([place]) 02/05/2016  . B12 deficiency 07/25/2015  . Deficiency of vitamin B 07/25/2015  . Chest pain 07/01/2015  . Multiple myeloma ([place]) 04/12/2015  . Arteriosclerosis of coronary artery 04/09/2015  . CAFL (chronic airflow limitation) ([place]) 04/09/2015  . Chronic kidney disease requiring chronic dialysis ([place]) 04/09/2015  . Gastro-esophageal reflux disease without esophagitis 04/09/2015  . Gout 04/09/2015  . H/O acute myocardial infarction 04/09/2015  . HLD (hyperlipidemia) 04/09/2015  . BP (high blood pressure) 04/09/2015  . Bad memory 04/09/2015  . 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 Kidney failure 04/09/2015  . End-stage renal disease ([place]) 04/09/2015  . Chronic kidney disease (CKD), stage V ([place]) 07/20/2013  . Chronic kidney disease, stage V ([place]) 07/20/2013  . Absolute anemia 03/31/2013  . Neuropathy 03/31/2013    Past Medical History:  Diagnosis Date  . Cancer ([place])   . Chronic kidney disease   . COPD  (chronic obstructive pulmonary disease) ([place])   . Coronary artery disease   . GERD (gastroesophageal reflux disease)   . Multiple myeloma ([place])   . Myocardial infarction ([place]) 2005  . Neuropathy   . Shortness of breath dyspnea   . Stroke (cerebrum) ([facility])    weakness Lt hand  . Systolic CHF [facility])     Social History   Socioeconomic History  . Marital status: Married    Spouse name: Not on file  . Number of children: Not on file  . Years of education: Not on file  . Highest education level: Not on file  Social Needs  . Financial resource strain: Not on file  . Food insecurity - worry: Not on file  . Food insecurity - inability: Not on file  . Transportation needs - medical: Not on file  . Transportation needs - non-medical: Not on file  Occupational History  . Not on file  Tobacco Use  . Smoking status: Current Every Day Smoker    Packs/day: 0.50    Types: Cigarettes  . Smokeless tobacco: Never Used  Substance and Sexual Activity  . Alcohol use: No    Alcohol/week: 0.0 oz  . Drug use: No  . Sexual activity: No  Other Topics Concern  . Not on file  Social History Narrative  . Not on file    Outpatient Encounter Medications as of 12/01/2017  Medication Sig  . aspirin EC 81 MG tablet Take 81 mg by mouth daily.  Marland Kitchen donepezil (ARICEPT) 10 MG tablet TAKE 1 TABLET BY MOUTH AT  BEDTIME  .  furosemide (LASIX) 80 MG tablet Take 1 tablet (80 mg total) by mouth every Tuesday, Thursday, Saturday, and Sunday.  . lidocaine-prilocaine (EMLA) cream Apply 1 application topically daily as needed.   . lovastatin (MEVACOR) 40 MG tablet TAKE 1 TABLET BY MOUTH EVERY DAY FOR HIGH CHOLESTEROL  . metoprolol tartrate (LOPRESSOR) 25 MG tablet Take 0.5 tablets (12.5 mg total) by mouth 2 (two) times daily.  . Multiple Vitamin (MULTI-VITAMINS) TABS Take 1 tablet by mouth daily. Reported on 04/01/2016  . NON FORMULARY Dialysis 3 times a week  . omeprazole (PRILOSEC) 20 MG capsule TAKE 1 CAPSULE BY  MOUTH  DAILY.  Marland Kitchen RENVELA 800 MG tablet Take 800 mg by mouth 3 (three) times daily.  . sucralfate (CARAFATE) 1 g tablet Take 1 tablet (1 g total) by mouth 4 (four) times daily -  before meals and at bedtime.  . traMADol (ULTRAM) 50 MG tablet Take 1 tablet (50 mg total) every 12 (twelve) hours as needed by mouth.  . vitamin B-12 (CYANOCOBALAMIN) 1000 MCG tablet Take 1,000 mcg by mouth daily.   No facility-administered encounter medications on file as of 12/01/2017.     No Known Allergies  Review of Systems  Constitutional: Positive for malaise/fatigue.  Eyes: Negative for blurred vision, pain and discharge.  Respiratory: Positive for shortness of breath.   Cardiovascular: Negative.   Musculoskeletal: Negative for joint pain.  Neurological: Negative for dizziness.  Endo/Heme/Allergies: Bruises/bleeds easily.    Objective:  BP (!) 98/42   Pulse 77   Temp 97.8 F (36.6 C)   Resp 14   Wt 197 lb (89.4 kg)   SpO2 98%   BMI 24.62 kg/m   Physical Exam  Constitutional: He is oriented to person, place, and time and well-developed, well-nourished, and in no distress.  Eyes: Conjunctivae are normal. Pupils are equal, round, and reactive to light.  Neck: Normal range of motion. Neck supple.  Cardiovascular: Normal rate, regular rhythm and intact distal pulses.  Murmur heard.  Systolic murmur is present with a grade of 3/6. Pulmonary/Chest: Effort normal and breath sounds normal. No respiratory distress. He has no wheezes.  Neurological: He is alert and oriented to person, place, and time.    Assessment and Plan :  1. Nosebleed Resolved today. Had this over the weekend. - Ambulatory referral to Hematology  2. Bleeding Abnormal in the past 4 days. I think this is probably due to Multiple myeloma. Will refer back to Hematology sooner-Dr Corcoran-hopefully can get him in this week to follow up. Exam today is normal. Does not seem to be anemic on the exam. - Ambulatory referral to  Hematology  3. Chronic kidney disease requiring chronic dialysis ([place]) 4. Multiple myeloma, remission status unspecified ([place]) - Ambulatory referral to Hematology  5. B12 deficiency    I have done the exam and reviewed the chart and it is accurate to the best of my knowledge. Development worker, community has been used and  any errors in dictation or transcription are unintentional. Miguel Aschoff M.D. [facility]

## 2017-12-02 DIAGNOSIS — D631 Anemia in chronic kidney disease: Secondary | ICD-10-CM | POA: Diagnosis not present

## 2017-12-02 DIAGNOSIS — E8779 Other fluid overload: Secondary | ICD-10-CM | POA: Diagnosis not present

## 2017-12-02 DIAGNOSIS — N186 End stage renal disease: Secondary | ICD-10-CM | POA: Diagnosis not present

## 2017-12-02 DIAGNOSIS — D689 Coagulation defect, unspecified: Secondary | ICD-10-CM | POA: Diagnosis not present

## 2017-12-02 DIAGNOSIS — N2581 Secondary hyperparathyroidism of renal origin: Secondary | ICD-10-CM | POA: Diagnosis not present

## 2017-12-02 DIAGNOSIS — D509 Iron deficiency anemia, unspecified: Secondary | ICD-10-CM | POA: Diagnosis not present

## 2017-12-03 ENCOUNTER — Telehealth: Payer: Self-pay | Admitting: *Deleted

## 2017-12-03 NOTE — Telephone Encounter (Signed)
Erroneous error

## 2017-12-04 ENCOUNTER — Other Ambulatory Visit: Payer: Self-pay

## 2017-12-04 ENCOUNTER — Inpatient Hospital Stay (HOSPITAL_BASED_OUTPATIENT_CLINIC_OR_DEPARTMENT_OTHER): Payer: Medicare Other | Admitting: Hematology and Oncology

## 2017-12-04 ENCOUNTER — Inpatient Hospital Stay: Payer: Medicare Other

## 2017-12-04 ENCOUNTER — Encounter: Payer: Self-pay | Admitting: Hematology and Oncology

## 2017-12-04 VITALS — BP 117/71 | HR 72 | Temp 97.6°F | Resp 20 | Ht 75.0 in | Wt 193.0 lb

## 2017-12-04 DIAGNOSIS — I252 Old myocardial infarction: Secondary | ICD-10-CM | POA: Diagnosis not present

## 2017-12-04 DIAGNOSIS — R040 Epistaxis: Secondary | ICD-10-CM

## 2017-12-04 DIAGNOSIS — Z992 Dependence on renal dialysis: Secondary | ICD-10-CM | POA: Diagnosis not present

## 2017-12-04 DIAGNOSIS — N186 End stage renal disease: Secondary | ICD-10-CM | POA: Diagnosis not present

## 2017-12-04 DIAGNOSIS — K118 Other diseases of salivary glands: Secondary | ICD-10-CM | POA: Diagnosis not present

## 2017-12-04 DIAGNOSIS — E538 Deficiency of other specified B group vitamins: Secondary | ICD-10-CM

## 2017-12-04 DIAGNOSIS — D509 Iron deficiency anemia, unspecified: Secondary | ICD-10-CM

## 2017-12-04 DIAGNOSIS — D696 Thrombocytopenia, unspecified: Secondary | ICD-10-CM | POA: Diagnosis not present

## 2017-12-04 DIAGNOSIS — C9000 Multiple myeloma not having achieved remission: Secondary | ICD-10-CM | POA: Diagnosis not present

## 2017-12-04 DIAGNOSIS — D631 Anemia in chronic kidney disease: Secondary | ICD-10-CM | POA: Diagnosis not present

## 2017-12-04 LAB — CBC WITH DIFFERENTIAL/PLATELET
Basophils Absolute: 0 10*3/uL (ref 0–0.1)
Basophils Relative: 1 %
Eosinophils Absolute: 0.2 10*3/uL (ref 0–0.7)
Eosinophils Relative: 5 %
HCT: 29.3 % — ABNORMAL LOW (ref 40.0–52.0)
Hemoglobin: 9.4 g/dL — ABNORMAL LOW (ref 13.0–18.0)
Lymphocytes Relative: 23 %
Lymphs Abs: 1 10*3/uL (ref 1.0–3.6)
MCH: 32.6 pg (ref 26.0–34.0)
MCHC: 32 g/dL (ref 32.0–36.0)
MCV: 101.9 fL — ABNORMAL HIGH (ref 80.0–100.0)
Monocytes Absolute: 0.2 10*3/uL (ref 0.2–1.0)
Monocytes Relative: 6 %
Neutro Abs: 2.8 10*3/uL (ref 1.4–6.5)
Neutrophils Relative %: 65 %
Platelets: 112 10*3/uL — ABNORMAL LOW (ref 150–440)
RBC: 2.88 MIL/uL — ABNORMAL LOW (ref 4.40–5.90)
RDW: 16.4 % — ABNORMAL HIGH (ref 11.5–14.5)
WBC: 4.2 10*3/uL (ref 3.8–10.6)

## 2017-12-04 NOTE — Progress Notes (Signed)
See follow up assessment.

## 2017-12-04 NOTE — Progress Notes (Signed)
[facility] day:  12/04/2017   Chief Complaint: Frank Huang is a 84 y.o. male with smoldering multiple myeloma who is seen for reassessment after interval epistaxis.  HPI:  The patient was last seen in the medical oncology clinic on 10/22/2017.  At that time, he felt fine.  Patient had chronic pain in his RIGHT shoulder and back. Exam was stable.  Hematocrit was 33.5, hemoglobin 11.0, and platelets 88,000. Potassium was low at 3.0. Renal function was stable with a BUN of 26 and a creatinine of 3.64 on dialysis.   The clinic was contacted regarding 2 bleeding episodes in 1 week.  Labs on 12/01/2017 revealed a creatinine was 6.03 (pre dialysis).  PT was 13.3 (INR 1.02) and PTT 29.  CBC was ordered and not drawn.  Since last visit, he notes 3 episodes of bleeding.  Last Friday, 11/28/2017, he developed bruising around his left eye after rubbing it.  On 11/29/2017, he had left sided epistaxis.  On 12/01/2017 while at dialysis, he had bleeding after with his fistula was accessed.  He denies any new medications or herbal products.  He takes a baby aspirin a day.  He denies any fever.  He has a little cough.   Past Medical History:  Diagnosis Date  . Cancer ([place])   . Chronic kidney disease   . COPD (chronic obstructive pulmonary disease) ([place])   . Coronary artery disease   . GERD (gastroesophageal reflux disease)   . Multiple myeloma ([place])   . Myocardial infarction ([place]) 2005  . Neuropathy   . Shortness of breath dyspnea   . Stroke (cerebrum) ([facility])    weakness Lt hand  . Systolic CHF [facility])     Past Surgical History:  Procedure Laterality Date  . APPENDECTOMY    . AV FISTULA PLACEMENT Left   . CARDIAC CATHETERIZATION N/A 11/15/2016   Procedure: Left Heart Cath and Coronary Angiography;  Surgeon: Isaias Cowman, MD;  Location: [facility];  Service: Cardiovascular;  Laterality: N/A;  . CHOLECYSTECTOMY    . SHOULDER ARTHROSCOPY WITH  OPEN ROTATOR CUFF REPAIR Left 09/19/2015   Procedure: SHOULDER ARTHROSCOPY , subacromial decompression, debridement;  Surgeon: Corky Mull, MD;  Location: [facility] ORS;  Service: Orthopedics;  Laterality: Left;  . TOTAL HIP ARTHROPLASTY Left     Family History  Problem Relation Age of Onset  . Alzheimer's disease Mother   . Kidney failure Father   . Bone cancer Sister   . Stomach cancer Sister     Social History:  reports that he has been smoking cigarettes.  He has been smoking about 0.50 packs per day. he has never used smokeless tobacco. He reports that he does not drink alcohol or use drugs.  He began smoking at age 21. He smokes 20 packs per month (2 cartons). He has a 30-40 pack year smoking history.  The patient is accompanied by his wife, [name], today.  Allergies: No Known Allergies  Current Medications: Current Outpatient Medications  Medication Sig Dispense Refill  . aspirin EC 81 MG tablet Take 81 mg by mouth daily.    Marland Kitchen donepezil (ARICEPT) 10 MG tablet TAKE 1 TABLET BY MOUTH AT  BEDTIME 90 tablet 3  . furosemide (LASIX) 80 MG tablet Take 1 tablet (80 mg total) by mouth every Tuesday, Thursday, Saturday, and Sunday. 30 tablet 0  . lidocaine-prilocaine (EMLA) cream Apply 1 application topically daily as needed.     . lovastatin (  MEVACOR) 40 MG tablet TAKE 1 TABLET BY MOUTH EVERY DAY FOR HIGH CHOLESTEROL    . metoprolol tartrate (LOPRESSOR) 25 MG tablet Take 0.5 tablets (12.5 mg total) by mouth 2 (two) times daily. 60 tablet 0  . Multiple Vitamin (MULTI-VITAMINS) TABS Take 1 tablet by mouth daily. Reported on 04/01/2016    . NON FORMULARY Dialysis 3 times a week    . omeprazole (PRILOSEC) 20 MG capsule TAKE 1 CAPSULE BY MOUTH  DAILY. 90 capsule 3  . RENVELA 800 MG tablet Take 800 mg by mouth 3 (three) times daily.    . sucralfate (CARAFATE) 1 g tablet Take 1 tablet (1 g total) by mouth 4 (four) times daily -  before meals and at bedtime. 120 tablet 11  . traMADol (ULTRAM) 50 MG  tablet Take 1 tablet (50 mg total) every 12 (twelve) hours as needed by mouth. 60 tablet 0  . vitamin B-12 (CYANOCOBALAMIN) 1000 MCG tablet Take 1,000 mcg by mouth daily.     No current facility-administered medications for this visit.     Review of Systems:  GENERAL:  Feels "alright".  No fevers or sweats.  Weight up 1 pound. PERFORMANCE STATUS (ECOG):  1 HEENT:  Epistaxis.  No visual changes, runny nose, sore throat, mouth sores or tenderness. Lungs: No shortness of breath or cough.  No hemoptysis. Cardiac:  No current chest pain, palpitations, orthopnea, or PND.  Interval chest pain evaluation (see HPI). GI:  No nausea, vomiting, diarrhea, constipation, melena or hematochezia. GU:  Dialysis every MWF, sometimes misses.  No urgency, frequency, dysuria, or hematuria. Musculoskeletal:  Left hip discomfort s/p replacement.  Back pain (chronic).  Shoulder pain.  Right knee issues.  No muscle tenderness. Extremities:  No pain or swelling. Skin:  Periorbital ecchymosis after rubbing left eye.  Bleeding after fistula access.  Bruises easily on baby aspirin.  No rashes or skin changes. Neuro:  Left foot numbness. No headache,weakness, balance or coordination issues. Endocrine:  No diabetes, thyroid issues, hot flashes or night sweats. Psych:  No mood changes, depression or anxiety. Pain:  No focal pain. Review of systems:  All other systems reviewed and found to be negative.  Physical Exam: Blood pressure 117/71, pulse 72, temperature 97.6 F (36.4 C), temperature source Tympanic, resp. rate 20, height 6' 3"  (1.905 m), weight 193 lb (87.5 kg). GENERAL:  Well developed, well nourished, elderly gentleman sitting comfortably in the exam room in no acute distress. MENTAL STATUS:  Alert and oriented to person, place and time. HEAD:  Thin gray hair.  Male pattern baldness.  Normocephalic, atraumatic, face symmetric, no Cushingoid features. EYES:  Silver rimmed glasses.  Blue eyes.  Left periorbital  ecchymosis.  No conjunctival hemorrhage.  Pupils equal round and reactive to light and accomodation.  No conjunctivitis or scleral icterus. ENT:  Oropharynx clear without lesion.  No palatal petechiae.  Tongue normal.  Dentures.  Mucous membranes moist.  RESPIRATORY:  Clear to auscultation without rales, wheezes or rhonchi. CARDIOVASCULAR:  Regular rate and rhythm without murmur, rub or gallop. ABDOMEN:  Soft, non-tender, with active bowel sounds, and no hepatosplenomegaly.  No masses. SKIN:  Multiple moles. No petechiae.  No rashes, ulcers or lesions. EXTREMITIES: Left upper extremity fistula unremarkable.  Chronic bilateral lower extremity edema.  No skin discoloration.  No palpable cords. LYMPH NODES:  No palpable cervical, supraclavicular, axillary or inguinal adenopathy  NEUROLOGICAL: Unremarkable. PSYCH:  Appropriate.   Appointment on 12/04/2017  Component Date Value Ref Range Status  .  WBC 12/04/2017 4.2  3.8 - 10.6 K/uL Final  . RBC 12/04/2017 2.88* 4.40 - 5.90 MIL/uL Final  . Hemoglobin 12/04/2017 9.4* 13.0 - 18.0 g/dL Final  . HCT 12/04/2017 29.3* 40.0 - 52.0 % Final  . MCV 12/04/2017 101.9* 80.0 - 100.0 fL Final  . MCH 12/04/2017 32.6  26.0 - 34.0 pg Final  . MCHC 12/04/2017 32.0  32.0 - 36.0 g/dL Final  . RDW 12/04/2017 16.4* 11.5 - 14.5 % Final  . Platelets 12/04/2017 112* 150 - 440 K/uL Final  . Neutrophils Relative % 12/04/2017 65  % Final  . Neutro Abs 12/04/2017 2.8  1.4 - 6.5 K/uL Final  . Lymphocytes Relative 12/04/2017 23  % Final  . Lymphs Abs 12/04/2017 1.0  1.0 - 3.6 K/uL Final  . Monocytes Relative 12/04/2017 6  % Final  . Monocytes Absolute 12/04/2017 0.2  0.2 - 1.0 K/uL Final  . Eosinophils Relative 12/04/2017 5  % Final  . Eosinophils Absolute 12/04/2017 0.2  0 - 0.7 K/uL Final  . Basophils Relative 12/04/2017 1  % Final  . Basophils Absolute 12/04/2017 0.0  0 - 0.1 K/uL Final   Performed at [facility], [address] [place], [place] [address]     Assessment:  Frank Huang is a 84 y.o. 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  Bone marrow revealed 20% plasma cells. He was treated with thalidomide for 7 months then discontinued in 06/2005 secondary to rash and depression. He was treated briefly with Revlimid in 2013 which was also discontinued secondary to rash.    Bone marrow on 10/07/2017 revealed hypercellular bone marrow for age with plasma cell neoplasms. 18% of the sample was plasma cells.  M spike has been followed: 0.5 gm/dL on 06/15/2013, 0.8 gm/dL on 01/19/2014, 0.5 gm/dL on 11/22/2014, 0.9 on 06/28/2015, 0.7 on 09/20/2015, 0.6 on 01/02/2016, 0.8 on 04/16/2016, 0.6 on 08/21/2016, 0.6 on 11/20/2016, 0.6 on 02/19/2017, 0.9 on 05/21/2017, and 0.7 on 07/09/2017.  Kappa free light chains were 512 in 06/15/2013, 686 11/02/2013, 929 on 01/19/2014, 1212 in 05/10/2014, 1409 on 11/22/2014, 1119 on 02/28/2015, 851 (ratio 63.84) on 06/28/2015, 1006 (ratio 74.50) on 09/20/2015, 1133 (ratio 59.94) on 01/02/2016, 785.6 (ratio 24.32) on 04/16/2016, 834.9 (ratio 19.19) on 08/21/2016, 627 (ratio 24.4) on 02/19/2017, 822.5 (ratio 31.27) on 05/21/2017.  UPEP on 09/05/2017 revealed 418 mg/24 hours of protein.  M-spike was 2.1% (31 mg/24 hours).  Kappa free light chains were 2170, lambda free light chains were 17.80 with a ratio of 121.91 (2.04-10.37).  Immunofixation revealed IgA monoclonal protein with kappa light chain specificity.   He has subsequently been followed off therapy.  He had a hip fracture in in 12/2013.  Per notes, pathology revealed no myeloma.  Bone survey on 01/17/2015 revealed a 9 mm lytic lesion in the frontoparietal region (previously 7 mm). Bone survey on 04/11/2016 revealed posterior parietal region of the skull more prominent.  There was diffuse osteopenia. Bone survey on 05/29/2017 revealed no aggressive lytic or sclerotic osseous lesion of the axial and appendicular  skeleton.  He has end-stage renal disease unrelated to his myeloma.  He has been on dialysis since 11/2013 (MWF).   He has anemia due to chronic renal insufficiency.  He has a history of iron deficiency. He had a negative colonoscopy and upper endoscopy in 08/2004. Notes indicate he also had a small bowel capsule study. EGD and colonoscopy in 2014 revealed gastric lesions which were  cauterized. He receives IV iron and Procrit with dialysis.  Ferritin is elevated (? acute phase reactant as ESR elevated).  Ferritin was 1121 on 07/05/2015 and 743 on 01/02/2016.  Iron saturation was 30% on 07/05/2015.  He has B12 deficiency.  B12 was 120 on 07/05/2015.  Folate was 10.8.  He was on B12 IM monthy (last on 08/02/2016 with primary care).  He is on oral B12.  B12 was 728 and folate 17.8 on 05/28/2017.   He has fluctuating thrombocytopenia.  Platelet count was normal on 11/14/2016.  Platelet count has ranged between 74,000 - 115,000 in the last 4 months without trend.  Normal studies on 05/28/2017 included: hepatitis B and C testing.  ANA was + with reflex -. He is on no new medications or herbal products.  He has a history of left shoulder discomfort.  MRI on 06/14/2015 revealed a tendinopathy, osteoarthritis, and capsulitis.   Abdomen and pelvic CT scan on 04/30/2016 revealed a 1.3 cm irregularly marginated nodular lesion in the anterior left lower lobe. There was a subtle area of slightly diminished attenuation near the tail of the pancreas of uncertain significance. There were multiple renal lesions (some did not represent simple cysts).   Chest CT on 05/03/2016 revealed a 1.05 cm ill-defined slightly spiculated irregular nodule in the left lower lobe as well as a 0.8 cm nodule in the right lower lobe.  PET scan on 05/16/2016 revealed no evidence of active skeletal multiple myeloma or plasmacytoma.  The LEFT lower lobe pulmonary nodule was 9 mm and had mild metabolic activity (SUV 1.5).  There was a  hypermetabolic dense nodule along the medial surface of the RIGHT parotid gland most consistent with a primary parotid neoplasm (benign or malignant).  Chest CT on 08/20/2016 revealed interval resolution of bilateral lower lobe nodules c/w inflammatory or infectious process.  Ultrasound-guided biopsy of the right parotid nodule on 06/18/2016 was unsuccessful. He was seen by Dr. Carloyn Manner on 06/24/2016.  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 He is followed by Dr. Pryor Ochoa. Repeat soft tissue ultrasound on 06/30/2017 revealed a stable 1.6 cm nodule.   He had a myocardial infarction on 11/14/2016.  He is being medically managed.  Abdominal ultrasound on 07/22/2017 revealed a normal appearing liver and spleen (7.7 cm). There was no focal lesion identified in the liver with normal direction blood flow.  Symptomatically, he feels fine.  He has had some excess bleeding/bruising (epistaxis, left periorbital bruising, and excess bleeding with fistula access). Exam reveals resolving left periorbital ecchymosis.  .  Hematocrit is 29.3, hemoglobin 9.4, and platelets 112,000.  PT and PTT were normal.  Plan: 1.  Labs today: CBC with diff. 2.  Review labs drawn on 12/01/2017.  Reassurance. 3.  RTC as previously scheduled.   Lequita Asal, MD  12/04/2017, 5:35 PM

## 2017-12-05 DIAGNOSIS — N2581 Secondary hyperparathyroidism of renal origin: Secondary | ICD-10-CM | POA: Diagnosis not present

## 2017-12-05 DIAGNOSIS — E8779 Other fluid overload: Secondary | ICD-10-CM | POA: Diagnosis not present

## 2017-12-05 DIAGNOSIS — D689 Coagulation defect, unspecified: Secondary | ICD-10-CM | POA: Diagnosis not present

## 2017-12-05 DIAGNOSIS — D509 Iron deficiency anemia, unspecified: Secondary | ICD-10-CM | POA: Diagnosis not present

## 2017-12-05 DIAGNOSIS — D631 Anemia in chronic kidney disease: Secondary | ICD-10-CM | POA: Diagnosis not present

## 2017-12-05 DIAGNOSIS — N186 End stage renal disease: Secondary | ICD-10-CM | POA: Diagnosis not present

## 2017-12-07 ENCOUNTER — Encounter: Payer: Self-pay | Admitting: Hematology and Oncology

## 2017-12-08 DIAGNOSIS — D509 Iron deficiency anemia, unspecified: Secondary | ICD-10-CM | POA: Diagnosis not present

## 2017-12-08 DIAGNOSIS — E8779 Other fluid overload: Secondary | ICD-10-CM | POA: Diagnosis not present

## 2017-12-08 DIAGNOSIS — N186 End stage renal disease: Secondary | ICD-10-CM | POA: Diagnosis not present

## 2017-12-08 DIAGNOSIS — D689 Coagulation defect, unspecified: Secondary | ICD-10-CM | POA: Diagnosis not present

## 2017-12-08 DIAGNOSIS — N2581 Secondary hyperparathyroidism of renal origin: Secondary | ICD-10-CM | POA: Diagnosis not present

## 2017-12-08 DIAGNOSIS — D631 Anemia in chronic kidney disease: Secondary | ICD-10-CM | POA: Diagnosis not present

## 2017-12-11 DIAGNOSIS — Z992 Dependence on renal dialysis: Secondary | ICD-10-CM | POA: Diagnosis not present

## 2017-12-11 DIAGNOSIS — N186 End stage renal disease: Secondary | ICD-10-CM | POA: Diagnosis not present

## 2017-12-11 DIAGNOSIS — I120 Hypertensive chronic kidney disease with stage 5 chronic kidney disease or end stage renal disease: Secondary | ICD-10-CM | POA: Diagnosis not present

## 2017-12-12 DIAGNOSIS — N186 End stage renal disease: Secondary | ICD-10-CM | POA: Diagnosis not present

## 2017-12-12 DIAGNOSIS — D689 Coagulation defect, unspecified: Secondary | ICD-10-CM | POA: Diagnosis not present

## 2017-12-12 DIAGNOSIS — D631 Anemia in chronic kidney disease: Secondary | ICD-10-CM | POA: Diagnosis not present

## 2017-12-12 DIAGNOSIS — D509 Iron deficiency anemia, unspecified: Secondary | ICD-10-CM | POA: Diagnosis not present

## 2017-12-12 DIAGNOSIS — N2581 Secondary hyperparathyroidism of renal origin: Secondary | ICD-10-CM | POA: Diagnosis not present

## 2017-12-15 ENCOUNTER — Other Ambulatory Visit: Payer: Self-pay

## 2017-12-15 ENCOUNTER — Encounter: Payer: Self-pay | Admitting: Emergency Medicine

## 2017-12-15 ENCOUNTER — Emergency Department: Payer: Medicare Other

## 2017-12-15 ENCOUNTER — Inpatient Hospital Stay
Admission: EM | Admit: 2017-12-15 | Discharge: 2017-12-17 | DRG: 286 | Disposition: A | Discharge status: Home / Self Care | Payer: Medicare Other | Attending: Internal Medicine | Admitting: Internal Medicine

## 2017-12-15 DIAGNOSIS — I25110 Atherosclerotic heart disease of native coronary artery with unstable angina pectoris: Secondary | ICD-10-CM | POA: Diagnosis not present

## 2017-12-15 DIAGNOSIS — I2510 Atherosclerotic heart disease of native coronary artery without angina pectoris: Secondary | ICD-10-CM | POA: Diagnosis not present

## 2017-12-15 DIAGNOSIS — E785 Hyperlipidemia, unspecified: Secondary | ICD-10-CM | POA: Diagnosis present

## 2017-12-15 DIAGNOSIS — F17210 Nicotine dependence, cigarettes, uncomplicated: Secondary | ICD-10-CM | POA: Diagnosis present

## 2017-12-15 DIAGNOSIS — I248 Other forms of acute ischemic heart disease: Secondary | ICD-10-CM | POA: Diagnosis present

## 2017-12-15 DIAGNOSIS — D631 Anemia in chronic kidney disease: Secondary | ICD-10-CM | POA: Diagnosis present

## 2017-12-15 DIAGNOSIS — R079 Chest pain, unspecified: Secondary | ICD-10-CM | POA: Diagnosis not present

## 2017-12-15 DIAGNOSIS — N281 Cyst of kidney, acquired: Secondary | ICD-10-CM | POA: Diagnosis present

## 2017-12-15 DIAGNOSIS — C9000 Multiple myeloma not having achieved remission: Secondary | ICD-10-CM | POA: Diagnosis present

## 2017-12-15 DIAGNOSIS — E538 Deficiency of other specified B group vitamins: Secondary | ICD-10-CM | POA: Diagnosis present

## 2017-12-15 DIAGNOSIS — G629 Polyneuropathy, unspecified: Secondary | ICD-10-CM | POA: Diagnosis present

## 2017-12-15 DIAGNOSIS — I4891 Unspecified atrial fibrillation: Secondary | ICD-10-CM | POA: Diagnosis not present

## 2017-12-15 DIAGNOSIS — I700 Atherosclerosis of aorta: Secondary | ICD-10-CM | POA: Diagnosis not present

## 2017-12-15 DIAGNOSIS — I5022 Chronic systolic (congestive) heart failure: Secondary | ICD-10-CM | POA: Diagnosis present

## 2017-12-15 DIAGNOSIS — D509 Iron deficiency anemia, unspecified: Secondary | ICD-10-CM | POA: Diagnosis not present

## 2017-12-15 DIAGNOSIS — K219 Gastro-esophageal reflux disease without esophagitis: Secondary | ICD-10-CM | POA: Diagnosis not present

## 2017-12-15 DIAGNOSIS — I482 Chronic atrial fibrillation: Secondary | ICD-10-CM | POA: Diagnosis present

## 2017-12-15 DIAGNOSIS — I447 Left bundle-branch block, unspecified: Secondary | ICD-10-CM | POA: Diagnosis present

## 2017-12-15 DIAGNOSIS — Z8673 Personal history of transient ischemic attack (TIA), and cerebral infarction without residual deficits: Secondary | ICD-10-CM | POA: Diagnosis not present

## 2017-12-15 DIAGNOSIS — Z7982 Long term (current) use of aspirin: Secondary | ICD-10-CM

## 2017-12-15 DIAGNOSIS — Z96642 Presence of left artificial hip joint: Secondary | ICD-10-CM | POA: Diagnosis present

## 2017-12-15 DIAGNOSIS — N2581 Secondary hyperparathyroidism of renal origin: Secondary | ICD-10-CM | POA: Diagnosis present

## 2017-12-15 DIAGNOSIS — J449 Chronic obstructive pulmonary disease, unspecified: Secondary | ICD-10-CM | POA: Diagnosis present

## 2017-12-15 DIAGNOSIS — I10 Essential (primary) hypertension: Secondary | ICD-10-CM | POA: Diagnosis not present

## 2017-12-15 DIAGNOSIS — Z841 Family history of disorders of kidney and ureter: Secondary | ICD-10-CM

## 2017-12-15 DIAGNOSIS — I200 Unstable angina: Secondary | ICD-10-CM | POA: Diagnosis not present

## 2017-12-15 DIAGNOSIS — D689 Coagulation defect, unspecified: Secondary | ICD-10-CM | POA: Diagnosis not present

## 2017-12-15 DIAGNOSIS — I429 Cardiomyopathy, unspecified: Secondary | ICD-10-CM | POA: Diagnosis present

## 2017-12-15 DIAGNOSIS — Z9049 Acquired absence of other specified parts of digestive tract: Secondary | ICD-10-CM | POA: Diagnosis not present

## 2017-12-15 DIAGNOSIS — Z820 Family history of epilepsy and other diseases of the nervous system: Secondary | ICD-10-CM

## 2017-12-15 DIAGNOSIS — Z992 Dependence on renal dialysis: Secondary | ICD-10-CM | POA: Diagnosis not present

## 2017-12-15 DIAGNOSIS — I252 Old myocardial infarction: Secondary | ICD-10-CM

## 2017-12-15 DIAGNOSIS — Z800 Family history of malignant neoplasm of digestive organs: Secondary | ICD-10-CM | POA: Diagnosis not present

## 2017-12-15 DIAGNOSIS — I132 Hypertensive heart and chronic kidney disease with heart failure and with stage 5 chronic kidney disease, or end stage renal disease: Secondary | ICD-10-CM | POA: Diagnosis present

## 2017-12-15 DIAGNOSIS — N186 End stage renal disease: Secondary | ICD-10-CM | POA: Diagnosis present

## 2017-12-15 DIAGNOSIS — I340 Nonrheumatic mitral (valve) insufficiency: Secondary | ICD-10-CM | POA: Diagnosis present

## 2017-12-15 DIAGNOSIS — K5790 Diverticulosis of intestine, part unspecified, without perforation or abscess without bleeding: Secondary | ICD-10-CM | POA: Diagnosis present

## 2017-12-15 DIAGNOSIS — R0789 Other chest pain: Secondary | ICD-10-CM | POA: Diagnosis not present

## 2017-12-15 DIAGNOSIS — F0390 Unspecified dementia without behavioral disturbance: Secondary | ICD-10-CM | POA: Diagnosis present

## 2017-12-15 DIAGNOSIS — R0602 Shortness of breath: Secondary | ICD-10-CM | POA: Diagnosis not present

## 2017-12-15 HISTORY — DX: Essential (primary) hypertension: I10

## 2017-12-15 LAB — CBC
HEMATOCRIT: 28 % — AB (ref 40.0–52.0)
HEMOGLOBIN: 9.3 g/dL — AB (ref 13.0–18.0)
MCH: 34.5 pg — ABNORMAL HIGH (ref 26.0–34.0)
MCHC: 33.2 g/dL (ref 32.0–36.0)
MCV: 103.9 fL — AB (ref 80.0–100.0)
Platelets: 101 10*3/uL — ABNORMAL LOW (ref 150–440)
RBC: 2.69 MIL/uL — ABNORMAL LOW (ref 4.40–5.90)
RDW: 16.8 % — ABNORMAL HIGH (ref 11.5–14.5)
WBC: 3.4 10*3/uL — ABNORMAL LOW (ref 3.8–10.6)

## 2017-12-15 LAB — BASIC METABOLIC PANEL
ANION GAP: 15 (ref 5–15)
BUN: 31 mg/dL — ABNORMAL HIGH (ref 6–20)
CHLORIDE: 97 mmol/L — AB (ref 101–111)
CO2: 29 mmol/L (ref 22–32)
Calcium: 8.6 mg/dL — ABNORMAL LOW (ref 8.9–10.3)
Creatinine, Ser: 4.05 mg/dL — ABNORMAL HIGH (ref 0.61–1.24)
GFR calc Af Amer: 14 mL/min — ABNORMAL LOW (ref >60)
GFR, EST NON AFRICAN AMERICAN: 12 mL/min — AB (ref >60)
GLUCOSE: 118 mg/dL — AB (ref 65–99)
POTASSIUM: 3.5 mmol/L (ref 3.5–5.1)
Sodium: 141 mmol/L (ref 135–145)

## 2017-12-15 LAB — LIPASE, BLOOD: LIPASE: 43 U/L (ref 11–51)

## 2017-12-15 LAB — TSH: TSH: 1.633 u[IU]/mL (ref 0.350–4.500)

## 2017-12-15 LAB — TROPONIN I
Troponin I: 0.03 ng/mL (ref <0.03)
Troponin I: <0.03 ng/mL (ref <0.03)
Troponin I: <0.03 ng/mL (ref <0.03)

## 2017-12-15 MED ORDER — PANTOPRAZOLE SODIUM 40 MG PO TBEC
40.0000 mg | DELAYED_RELEASE_TABLET | Freq: Every day | ORAL | Status: DC
  Administered 2017-12-15 – 2017-12-17 (×3): 40 mg via ORAL
  Filled 2017-12-15 (×3): qty 1

## 2017-12-15 MED ORDER — HEPARIN SODIUM (PORCINE) 5000 UNIT/ML IJ SOLN
5000.0000 [IU] | Freq: Three times a day (TID) | INTRAMUSCULAR | Status: DC
  Administered 2017-12-15 – 2017-12-16 (×3): 5000 [IU] via SUBCUTANEOUS
  Filled 2017-12-15 (×3): qty 1

## 2017-12-15 MED ORDER — ASPIRIN EC 81 MG PO TBEC
81.0000 mg | DELAYED_RELEASE_TABLET | Freq: Every day | ORAL | Status: DC
  Administered 2017-12-15 – 2017-12-16 (×2): 81 mg via ORAL
  Filled 2017-12-15 (×2): qty 1

## 2017-12-15 MED ORDER — ADULT MULTIVITAMIN W/MINERALS CH
1.0000 | ORAL_TABLET | Freq: Every day | ORAL | Status: DC
  Administered 2017-12-15 – 2017-12-16 (×2): 1 via ORAL
  Filled 2017-12-15 (×2): qty 1

## 2017-12-15 MED ORDER — ACETAMINOPHEN 325 MG PO TABS: 650.0000 mg | ORAL_TABLET | Freq: Four times a day (QID) | ORAL | Status: DC | PRN

## 2017-12-15 MED ORDER — ALUM & MAG HYDROXIDE-SIMETH 200-200-20 MG/5ML PO SUSP
15.0000 mL | ORAL | Status: DC | PRN
  Administered 2017-12-15: 15 mL via ORAL
  Filled 2017-12-15: qty 30

## 2017-12-15 MED ORDER — MAGNESIUM SULFATE 2 GM/50ML IV SOLN
2.0000 g | Freq: Once | INTRAVENOUS | Status: AC
  Administered 2017-12-15: 2 g via INTRAVENOUS
  Filled 2017-12-15: qty 50

## 2017-12-15 MED ORDER — CALCITRIOL 0.25 MCG PO CAPS
0.5000 ug | ORAL_CAPSULE | ORAL | Status: DC
  Administered 2017-12-15: 0.5 ug via ORAL
  Filled 2017-12-15: qty 1
  Filled 2017-12-15: qty 2

## 2017-12-15 MED ORDER — SEVELAMER CARBONATE 800 MG PO TABS
1600.0000 mg | ORAL_TABLET | Freq: Three times a day (TID) | ORAL | Status: DC
  Administered 2017-12-15 – 2017-12-16 (×3): 1600 mg via ORAL
  Filled 2017-12-15 (×3): qty 2

## 2017-12-15 MED ORDER — BUDESONIDE 0.5 MG/2ML IN SUSP
0.5000 mg | Freq: Two times a day (BID) | RESPIRATORY_TRACT | Status: DC
  Administered 2017-12-15 – 2017-12-17 (×3): 0.5 mg via RESPIRATORY_TRACT
  Filled 2017-12-15 (×4): qty 2

## 2017-12-15 MED ORDER — IPRATROPIUM-ALBUTEROL 0.5-2.5 (3) MG/3ML IN SOLN
3.0000 mL | Freq: Four times a day (QID) | RESPIRATORY_TRACT | Status: DC
  Administered 2017-12-15 – 2017-12-17 (×6): 3 mL via RESPIRATORY_TRACT
  Filled 2017-12-15 (×7): qty 3
  Filled 2017-12-15: qty 6

## 2017-12-15 MED ORDER — LIDOCAINE-PRILOCAINE 2.5-2.5 % EX CREA
1.0000 "application " | TOPICAL_CREAM | Freq: Every day | CUTANEOUS | Status: DC | PRN
  Filled 2017-12-15: qty 5

## 2017-12-15 MED ORDER — IOPAMIDOL (ISOVUE-370) INJECTION 76%
100.0000 mL | Freq: Once | INTRAVENOUS | Status: AC | PRN
  Administered 2017-12-15: 100 mL via INTRAVENOUS

## 2017-12-15 MED ORDER — TRAMADOL HCL 50 MG PO TABS
50.0000 mg | ORAL_TABLET | Freq: Two times a day (BID) | ORAL | Status: DC | PRN
  Administered 2017-12-17: 50 mg via ORAL

## 2017-12-15 MED ORDER — DONEPEZIL HCL 5 MG PO TABS
10.0000 mg | ORAL_TABLET | Freq: Every day | ORAL | Status: DC
  Administered 2017-12-15 – 2017-12-16 (×2): 10 mg via ORAL
  Filled 2017-12-15 (×2): qty 2
  Filled 2017-12-15: qty 1

## 2017-12-15 MED ORDER — SUCRALFATE 1 G PO TABS
1.0000 g | ORAL_TABLET | Freq: Three times a day (TID) | ORAL | Status: DC
  Administered 2017-12-15 – 2017-12-16 (×5): 1 g via ORAL
  Filled 2017-12-15 (×5): qty 1

## 2017-12-15 MED ORDER — FUROSEMIDE 40 MG PO TABS
80.0000 mg | ORAL_TABLET | ORAL | Status: DC
  Administered 2017-12-16: 80 mg via ORAL
  Filled 2017-12-15: qty 2

## 2017-12-15 MED ORDER — METOPROLOL TARTRATE 25 MG PO TABS
12.5000 mg | ORAL_TABLET | Freq: Two times a day (BID) | ORAL | Status: DC
  Administered 2017-12-15 – 2017-12-16 (×3): 12.5 mg via ORAL
  Filled 2017-12-15 (×3): qty 1

## 2017-12-15 MED ORDER — PRAVASTATIN SODIUM 40 MG PO TABS
40.0000 mg | ORAL_TABLET | Freq: Every day | ORAL | Status: DC
  Administered 2017-12-15 – 2017-12-17 (×3): 40 mg via ORAL
  Filled 2017-12-15 (×3): qty 1

## 2017-12-15 MED ORDER — NITROGLYCERIN 0.4 MG SL SUBL: 0.4000 mg | SUBLINGUAL_TABLET | SUBLINGUAL | Status: DC | PRN

## 2017-12-15 MED ORDER — ACETAMINOPHEN 650 MG RE SUPP: 650.0000 mg | Freq: Four times a day (QID) | RECTAL | Status: DC | PRN

## 2017-12-15 NOTE — ED Triage Notes (Signed)
Arrives via [facility].  Called to dialysis for c/o left Chest pain.  Patient was mid way thorugh dialysis treatment.  324 mg ASA given by EMS.Patient states pain was initially sharp, but is now more of a pressure.  Alos c/o SOB with episode.

## 2017-12-15 NOTE — Progress Notes (Signed)
Patient ID: Frank Huang, male   DOB: 10-29-1932, 84 y.o.   MRN: [id number]  ACP discussion  Patient and family at bedside.  Diagnosis chest pain with history of coronary artery disease, end-stage renal disease, multiple myeloma, hypertension, hyperlipidemia and tobacco abuse.  CODE STATUS discussed with patient and family.  Patient would like to be a full code at this point in time.  Time spent on ACP discussion 17 minutes  Dr. Loletha Grayer

## 2017-12-15 NOTE — ED Provider Notes (Signed)
[facility] Emergency Department Provider Note       Time seen: ----------------------------------------- 8:30 AM on 12/15/2017 -----------------------------------------   I have reviewed the triage vital signs and the nursing notes.  HISTORY   Chief Complaint Chest Pain    HPI Frank Huang is a 84 y.o. male with a history of cancer, chronic kidney disease, COPD, GERD, multiple myeloma, MI, end-stage renal disease on dialysis who presents to the ED for sudden onset left-sided sharp chest pain that started midway through his dialysis treatment.  He was given aspirin by EMS.  His sharp pain seemed to resolve into pressure type pain.  He had some shortness of breath with the episode.  He had pain like this in 2005 with an MI.  Currently the pain is improving.  Past Medical History:  Diagnosis Date  . Cancer ([place])   . Chronic kidney disease   . COPD (chronic obstructive pulmonary disease) ([place])   . Coronary artery disease   . GERD (gastroesophageal reflux disease)   . Multiple myeloma ([place])   . Myocardial infarction ([place]) 2005  . Neuropathy   . Shortness of breath dyspnea   . Stroke (cerebrum) (H[facility])    weakness Lt hand  . Systolic CHF [facility])     Patient Active Problem List   Diagnosis Date Noted  . Pain of right scapula 09/26/2017  . Thrombocytopenia ([place]) 03/19/2017  . Pneumonia 01/28/2017  . NSTEMI (non-ST elevated myocardial infarction) ([place]) 11/15/2016  . Chest pain, rule out acute myocardial infarction 11/14/2016  . Parotid mass 05/17/2016  . Nodule of right lung 05/11/2016  . Persistent atrial fibrillation ([place]) 02/05/2016  . B12 deficiency 07/25/2015  . Deficiency of vitamin B 07/25/2015  . Chest pain 07/01/2015  . Multiple myeloma ([place]) 04/12/2015  . Arteriosclerosis of coronary artery 04/09/2015  . CAFL (chronic airflow limitation) ([place]) 04/09/2015  . Chronic kidney disease requiring chronic dialysis ([place]) 04/09/2015  . Gastro-esophageal  reflux disease without esophagitis 04/09/2015  . Gout 04/09/2015  . H/O acute myocardial infarction 04/09/2015  . HLD (hyperlipidemia) 04/09/2015  . BP (high blood pressure) 04/09/2015  . Bad memory 04/09/2015  . 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 Kidney failure 04/09/2015  . End-stage renal disease ([place]) 04/09/2015  . Chronic kidney disease (CKD), stage V ([place]) 07/20/2013  . Chronic kidney disease, stage V ([place]) 07/20/2013  . Absolute anemia 03/31/2013  . Neuropathy 03/31/2013    Past Surgical History:  Procedure Laterality Date  . APPENDECTOMY    . AV FISTULA PLACEMENT Left   . CARDIAC CATHETERIZATION N/A 11/15/2016   Procedure: Left Heart Cath and Coronary Angiography;  Surgeon: Isaias Cowman, MD;  Location: [facility];  Service: Cardiovascular;  Laterality: N/A;  . CHOLECYSTECTOMY    . SHOULDER ARTHROSCOPY WITH OPEN ROTATOR CUFF REPAIR Left 09/19/2015   Procedure: SHOULDER ARTHROSCOPY , subacromial decompression, debridement;  Surgeon: Corky Mull, MD;  Location: [facility] OR[facility];  Service: Orthopedics;  Laterality: Left;  . TOTAL HIP ARTHROPLASTY Left     Allergies Patient has no known allergies.  Social History Social History   Tobacco Use  . Smoking status: Current Every Day Smoker    Packs/day: 0.50    Types: Cigarettes  . Smokeless tobacco: Never Used  Substance Use Topics  . Alcohol use: No    Alcohol/week: 0.0 oz  . Drug use: No    Review of Systems Constitutional: Negative for fever. Cardiovascular: Positive for chest pain Respiratory:  Positive for shortness of breath Gastrointestinal: Negative for abdominal pain, vomiting and diarrhea. Genitourinary: Negative for dysuria. Musculoskeletal: Negative for back pain. Skin: Negative for rash. Neurological: Negative for headaches, focal weakness or numbness.  All systems negative/normal/unremarkable except as stated in the  HPI  ____________________________________________   PHYSICAL EXAM:  VITAL SIGNS: ED Triage Vitals [12/15/17 0828]  Enc Vitals Group     BP      Pulse      Resp      Temp      Temp src      SpO2      Weight 193 lb (87.5 kg)     Height _0  (1.905 m)     Head Circumference      Peak Flow      Pain Score 5     Pain Loc      Pain Edu?      Excl. in [place]?     Constitutional: Alert and oriented. Well appearing and in no distress. Eyes: Conjunctivae are normal. Normal extraocular movements. ENT   Head: Normocephalic and atraumatic.   Nose: No congestion/rhinnorhea.   Mouth/Throat: Mucous membranes are moist.   Neck: No stridor. Cardiovascular: Normal rate, regular rhythm.  Loud systolic murmur noted Respiratory: Normal respiratory effort without tachypnea nor retractions. Breath sounds are clear and equal bilaterally. No wheezes/rales/rhonchi. Gastrointestinal: Soft and nontender. Normal bowel sounds Musculoskeletal: Nontender with normal range of motion in extremities. No lower extremity tenderness nor edema. Neurologic:  Normal speech and language. No gross focal neurologic deficits are appreciated.  Skin:  Skin is warm, dry and intact. No rash noted. Psychiatric: Mood and affect are normal. Speech and behavior are normal.  ____________________________________________  EKG: Interpreted by me.  Atrial fibrillation with a rate of 80 bpm, left bundle branch block, left axis deviation, normal QT  ____________________________________________  ED COURSE:  As part of my medical decision making, I reviewed the following data within the [place] History obtained from family if available, nursing notes, old chart and ekg, as well as notes from prior ED visits. Patient presented for chest pain, we will assess with labs and imaging as indicated at this time.   Procedures ____________________________________________   LABS (pertinent  positives/negatives)  Labs Reviewed  BASIC METABOLIC PANEL - Abnormal; Notable for the following components:      Result Value   Chloride 97 (*)    Glucose, Bld 118 (*)    BUN 31 (*)    Creatinine, Ser 4.05 (*)    Calcium 8.6 (*)    GFR calc non Af Amer 12 (*)    GFR calc Af Amer 14 (*)    All other components within normal limits  CBC - Abnormal; Notable for the following components:   WBC 3.4 (*)    RBC 2.69 (*)    Hemoglobin 9.3 (*)    HCT 28.0 (*)    MCV 103.9 (*)    MCH 34.5 (*)    RDW 16.8 (*)    Platelets 101 (*)    All other components within normal limits  TROPONIN I - Abnormal; Notable for the following components:   Troponin I 0.03 (*)    All other components within normal limits  LIPASE, BLOOD    RADIOLOGY Images were viewed by me  Chest x-ray IMPRESSION: Prominent aortic knob when compared with prior exams. This may be projectional in nature due to the portable technique and patient rotation although the possibility of acute aortic  abnormality cannot be totally excluded. CTA of the chest is recommended for further evaluation.  Critical Value/emergent results were called by telephone at the time of interpretation on 12/15/2017 at 9:05 am to Dr. Lenise Arena , who verbally acknowledged these results. CTA chest  IMPRESSION: No acute vascular finding, with no evidence of acute aortic syndrome.  Similar appearance of moderate atherosclerotic changes of the aorta, including irregular focal plaque/ectasia of several locations in the aortic arch and descending thoracic aorta. The focal outpouching of the aortic arch at the aortic isthmus is in a typical location for a ductus diverticulum, and this finding has been unchanged dating to the most remote comparison CT of 12/13/2013. Aortic Atherosclerosis (ICD10-I70.0).  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  Questionable inflammatory changes adjacent to the proximal duodenum, new from the comparison abdominal CT. If there is concern for epigastric/back pain that may correlate to a possible duodenitis or groove pancreatitis, correlation with lab values and upper endoscopy may be useful.  Left main and 3 vessel coronary artery disease.  Diverticular disease without evidence of acute diverticulitis.  Bilateral renal cortical thinning compatible with chronic medical renal disease. Multiple bilateral rounded/cystic renal lesions, none of which are completely characterized on this single phase CT. If there is reason for more accurate characterization, contrast-enhanced MRI or alternatively renal protocol CT may be considered.  Trace bilateral pleural effusions. ____________________________________________  DIFFERENTIAL DIAGNOSIS   MI, PE, dissection, pneumothorax, unstable angina, musculoskeletal pain  FINAL ASSESSMENT AND PLAN  Chest pain  Plan: Patient had presented for chest pain during dialysis. Patient's labs did not reveal any acute process. Patient's imaging although abnormal appears for the most part chronic and stable.  I do not have a clear etiology for his chest pain.  I will discussed with the hospitalist for admission and cardiac rule out.   Laurence Aly, MD   Note: This note was generated in part or whole with voice recognition software. Voice recognition is usually quite accurate but there are transcription errors that can and very often do occur. I apologize for any typographical errors that were not detected and corrected.      Earleen Newport, MD 12/15/17 1113

## 2017-12-15 NOTE — Plan of Care (Signed)
Pt admitted this shift from ED . A&Ox4. VSS . RA . Ambulatory. NSR on monitor. No complaints thus far. Pt went for HD this shift. Will continue to monitor and report to oncoming RN .  Progressing Education: Knowledge of General Education information will improve 12/15/2017 1500 - Progressing by Aleen Campi, RN Health Behavior/Discharge Planning: Ability to manage health-related needs will improve 12/15/2017 1500 - Progressing by Aleen Campi, RN Clinical Measurements: Ability to maintain clinical measurements within normal limits will improve 12/15/2017 1500 - Progressing by Aleen Campi, RN Will remain free from infection 12/15/2017 1500 - Progressing by Aleen Campi, RN Diagnostic test results will improve 12/15/2017 1500 - Progressing by Aleen Campi, RN Respiratory complications will improve 12/15/2017 1500 - Progressing by Aleen Campi, RN Cardiovascular complication will be avoided 12/15/2017 1500 - Progressing by Aleen Campi, RN Activity: Risk for activity intolerance will decrease 12/15/2017 1500 - Progressing by Aleen Campi, RN Nutrition: Adequate nutrition will be maintained 12/15/2017 1500 - Progressing by Aleen Campi, RN Coping: Level of anxiety will decrease 12/15/2017 1500 - Progressing by Aleen Campi, RN Elimination: Will not experience complications related to bowel motility 12/15/2017 1500 - Progressing by Aleen Campi, RN Will not experience complications related to urinary retention 12/15/2017 1500 - Progressing by Aleen Campi, RN Pain Managment: General experience of comfort will improve 12/15/2017 1500 - Progressing by Aleen Campi, RN Safety: Ability to remain free from injury will improve 12/15/2017 1500 - Progressing by Aleen Campi, RN Skin Integrity: Risk for impaired skin integrity will decrease 12/15/2017 1500 - Progressing by Aleen Campi, RN

## 2017-12-15 NOTE — ED Notes (Signed)
Dialysis unit called and informed of need to de access patient's AV shunt.

## 2017-12-15 NOTE — H&P (Signed)
Frank Huang at [place] NAME: Frank Huang    MR#:  [id number]  DATE OF BIRTH:  [birth year]  DATE OF ADMISSION:  12/15/2017  PRIMARY CARE PHYSICIAN: Frank Huang., MD   REQUESTING/REFERRING PHYSICIAN: Dr Frank Huang  CHIEF COMPLAINT:   Chief Complaint  Patient presents with  . Chest Pain    HISTORY OF PRESENT ILLNESS:  Frank Huang  is a 84 y.o. male with a known history of end-stage renal disease and multiple myeloma.  He presents to the ED with chest pain that occurred at dialysis.  He had an hour and 1/2-2 hours worth of dialysis.  He developed a little pain in his chest and then a sharper 1 hit and he had a sit up.  It hurt like crazy 8-9 out of 10 in intensity.  It lasted about 20 minutes and then had some pressure feeling.  Currently the patient states he has no pain whatsoever.  He has occasional shortness of breath.  No sweating or nausea with this episode.  In the ED the first troponin was negative.  He had a CT scan of the chest which was negative for dissection but did comment on left main and three-vessel coronary artery disease.  Hospitalist services were contacted for further evaluation.  Patient did have a cardiac cath last year that did show mitral regurgitation, 60-70% stenosis of the ostium left main, patent stent to proximal LAD, occluded proximal left circumflex and right coronary artery with contralateral is present.  PAST MEDICAL HISTORY:   Past Medical History:  Diagnosis Date  . Cancer ([place])   . Chronic kidney disease   . COPD (chronic obstructive pulmonary disease) ([place])   . Coronary artery disease   . GERD (gastroesophageal reflux disease)   . Hypertension   . Multiple myeloma ([place])   . Myocardial infarction ([place]) 2005  . Neuropathy   . Shortness of breath dyspnea   . Stroke (cerebrum) ([facility])    weakness Lt hand  . Systolic CHF ([place])     PAST SURGICAL HISTORY:   Past Surgical History:  Procedure  Laterality Date  . APPENDECTOMY    . AV FISTULA PLACEMENT Left   . CARDIAC CATHETERIZATION N/A 11/15/2016   Procedure: Left Heart Cath and Coronary Angiography;  Surgeon: Isaias Cowman, MD;  Location: [facility];  Service: Cardiovascular;  Laterality: N/A;  . CHOLECYSTECTOMY    . SHOULDER ARTHROSCOPY WITH OPEN ROTATOR CUFF REPAIR Left 09/19/2015   Procedure: SHOULDER ARTHROSCOPY , subacromial decompression, debridement;  Surgeon: Corky Mull, MD;  Location: [facility];  Service: Orthopedics;  Laterality: Left;  . TOTAL HIP ARTHROPLASTY Left     SOCIAL HISTORY:   Social History   Tobacco Use  . Smoking status: Current Every Day Smoker    Packs/day: 0.50    Types: Cigarettes  . Smokeless tobacco: Never Used  Substance Use Topics  . Alcohol use: No    Alcohol/week: 0.0 oz    FAMILY HISTORY:   Family History  Problem Relation Age of Onset  . Alzheimer's disease Mother   . Kidney failure Father   . Bone cancer Sister   . Stomach cancer Sister     DRUG ALLERGIES:  No Known Allergies  REVIEW OF SYSTEMS:  CONSTITUTIONAL: No fever, fatigue or weakness.  EYES: No blurred or double vision.  EARS, NOSE, AND THROAT: No tinnitus or ear pain. No sore throat RESPIRATORY: No cough, shortness of breath, wheezing or hemoptysis.  CARDIOVASCULAR: No chest pain, orthopnea, edema.  GASTROINTESTINAL: No nausea, vomiting, diarrhea or abdominal pain. No blood in bowel movements GENITOURINARY: No dysuria, hematuria.  ENDOCRINE: No polyuria, nocturia,  HEMATOLOGY: No anemia, easy bruising or bleeding SKIN: No rash or lesion. MUSCULOSKELETAL: No joint pain or arthritis.   NEUROLOGIC: No tingling, numbness, weakness.  PSYCHIATRY: No anxiety or depression.   MEDICATIONS AT HOME:   Prior to Admission medications   Medication Sig Start Date End Date Taking? Authorizing Provider  aspirin EC 81 MG tablet Take 81 mg by mouth daily.   Yes [provider]  calcitRIOL  (ROCALTROL) 0.5 MCG capsule Take 0.5 mcg by mouth 3 (three) times a week.   Yes [provider]  donepezil (ARICEPT) 10 MG tablet TAKE 1 TABLET BY MOUTH AT  BEDTIME 02/17/17  Yes Frank Huang., MD  furosemide (LASIX) 80 MG tablet Take 1 tablet (80 mg total) by mouth every Tuesday, Thursday, Saturday, and Sunday. 02/01/17  Yes Mody, Ulice Bold, MD  lovastatin (MEVACOR) 40 MG tablet TAKE 1 TABLET BY MOUTH EVERY DAY FOR HIGH CHOLESTEROL 08/10/14  Yes [provider]  metoprolol tartrate (LOPRESSOR) 25 MG tablet Take 0.5 tablets (12.5 mg total) by mouth 2 (two) times daily. 11/16/16  Yes Vaughan Basta, MD  Multiple Vitamin (MULTI-VITAMINS) TABS Take 1 tablet by mouth daily. Reported on 04/01/2016   Yes [provider]  omeprazole (PRILOSEC) 20 MG capsule TAKE 1 CAPSULE BY MOUTH  DAILY. 11/05/16  Yes Frank Huang., MD  RENVELA 800 MG tablet Take 1,600 mg by mouth 3 (three) times daily.  11/05/16  Yes [provider]  sucralfate (CARAFATE) 1 g tablet Take 1 tablet (1 g total) by mouth 4 (four) times daily -  before meals and at bedtime. 07/17/17  Yes Frank Huang., MD  lidocaine-prilocaine (EMLA) cream Apply 1 application topically daily as needed.  04/18/16   [provider]  traMADol (ULTRAM) 50 MG tablet Take 1 tablet (50 mg total) every 12 (twelve) hours as needed by mouth. 09/26/17   Bacigalupo, Dionne Bucy, MD      VITAL SIGNS:  Blood pressure 103/73, pulse 70, temperature (!) 97.4 F (36.3 C), temperature source Oral, resp. rate 16, height 6' 3"  (1.905 m), weight 87.5 kg (193 lb), SpO2 94 %.  PHYSICAL EXAMINATION:  GENERAL:  84 y.o.-year-old patient lying in the bed with no acute distress.  EYES: Pupils equal, round, reactive to light and accommodation. No scleral icterus. Extraocular muscles intact.  HEENT: Head atraumatic, normocephalic. Oropharynx and nasopharynx clear.  NECK:  Supple, no jugular venous distention. No thyroid  enlargement, no tenderness.  LUNGS: Normal breath sounds bilaterally, no wheezing, rales.  Positive scattered rhonchi. No use of accessory muscles of respiration.  CARDIOVASCULAR: S1, S2 normal. No murmurs, rubs, or gallops.  ABDOMEN: Soft, nontender, nondistended. Bowel sounds present. No organomegaly or mass.  EXTREMITIES: No pedal edema, cyanosis, or clubbing.  NEUROLOGIC: Cranial nerves II through XII are intact. Muscle strength 5/5 in all extremities. Sensation intact. Gait not checked.  PSYCHIATRIC: The patient is alert and oriented x 3.  SKIN: No rash, lesion, or ulcer.   LABORATORY PANEL:   CBC Recent Labs  Lab 12/15/17 0828  WBC 3.4*  HGB 9.3*  HCT 28.0*  PLT 101*   ------------------------------------------------------------------------------------------------------------------  Chemistries  Recent Labs  Lab 12/15/17 0828  NA 141  K 3.5  CL 97*  CO2 29  GLUCOSE 118*  BUN 31*  CREATININE 4.05*  CALCIUM  8.6*   ------------------------------------------------------------------------------------------------------------------  Cardiac Enzymes Recent Labs  Lab 12/15/17 0828  TROPONINI 0.03*   ------------------------------------------------------------------------------------------------------------------  RADIOLOGY:  Dg Chest Port 1 View  Result Date: 12/15/2017 CLINICAL DATA:  Left-sided chest pain EXAM: PORTABLE CHEST 1 VIEW COMPARISON:  09/26/2017 FINDINGS: Cardiac shadow is mildly enlarged. Aortic calcifications are again seen. Increased density is noted to the lateral aspect of the aortic arch which may be projectional in nature although the possibility of an aortic abnormality cannot be totally excluded. The lungs are well aerated bilaterally. Left arm stent is noted. No bony abnormality is seen. IMPRESSION: Prominent aortic knob when compared with prior exams. This may be projectional in nature due to the portable technique and patient rotation although the  possibility of acute aortic abnormality cannot be totally excluded. CTA of the chest is recommended for further evaluation. Critical Value/emergent results were called by telephone at the time of interpretation on 12/15/2017 at 9:05 am to Dr. Lenise Arena , who verbally acknowledged these results. Electronically Signed   By: [name] M.D.   On: 12/15/2017 09:10   Ct Angio Chest/abd/pel For Dissection W And/or Wo Contrast  Result Date: 12/15/2017 CLINICAL DATA:  84 year old male with a history of left-sided chest pain during dialysis EXAM: CT ANGIOGRAPHY CHEST, ABDOMEN AND PELVIS TECHNIQUE: Multidetector CT imaging through the chest, abdomen and pelvis was performed using the standard protocol during bolus administration of intravenous contrast. Multiplanar reconstructed images and MIPs were obtained and reviewed to evaluate the vascular anatomy. CONTRAST:  11m ISOVUE-370 IOPAMIDOL (ISOVUE-370) INJECTION 76% COMPARISON:  Multiple prior comparison CT, most recent chest CT 08/20/2016, 05/03/2016, most remote CT 12/13/2013 FINDINGS: CTA CHEST FINDINGS Cardiovascular: Heart: Heart size unchanged with borderline cardiomegaly. No pericardial fluid/thickening. Calcifications of left main, left anterior descending, circumflex, right coronary artery. Aorta: Noncontrast CT demonstrates no evidence of wall hematoma. Course caliber and contour unchanged from the comparison. Moderate to advanced atherosclerotic changes of the aortic arch and descending thoracic aorta, with mixed calcified and soft plaque. Similar configuration of irregular plaque at the aortic arch superolateral margin with mild ectasias. Greatest diameter of the aorta at this site measures 4.1 cm, which is essentially unchanged to the comparison CT of 08/20/2016. Re-demonstration of focal outpouching at the isthmus of the aorta, projecting inferiorly toward the pulmonary artery configuration is unchanged dating to the most remote comparison CT of  12/13/2013. No associated inflammatory changes. No periaortic fluid. Additional sites of irregular plaque of the descending thoracic aorta without aneurysm, dissection, or periaortic inflammatory change/fluid. Greatest diameter of the thoracic aorta at the aortic hiatus measures 2.9 cm. Pulmonary arteries: No central filling defects of the main pulmonary artery, lobar pulmonary arteries, or proximal segmental pulmonary arteries. Beyond this, timing of the contrast bolus limits the study. Mediastinum/Nodes: No mediastinal adenopathy. Unremarkable appearance of the thoracic esophagus. Unremarkable appearance of the thoracic inlet and thyroid. Lungs/Pleura: Central airways are clear. Trace bilateral pleural effusions no confluent airspace disease. No pneumothorax. Musculoskeletal: Multilevel degenerative changes of the thoracic spine. Accentuated kyphotic curvature at the thoracolumbar junction. Review of the MIP images confirms the above findings. CTA ABDOMEN AND PELVIS FINDINGS VASCULAR Aorta: Moderate atherosclerotic changes of the abdominal aorta with mixed calcified and soft plaque. No circumferential plaque. No dissection of the abdominal aorta. No periaortic fluid. Greatest diameter of the infrarenal abdominal aorta at the level of the inferior mesenteric artery origin measuring 2.4 cm. Celiac: Atherosclerotic changes at the origin the celiac artery with what appears to be high-grade stenosis secondary to  mixed calcified and soft plaque. Branch vessels are patent. SMA: Atherosclerotic changes at the origin the superior mesenteric artery, with no significant stenosis. Renals: Atherosclerotic changes at the bilateral renal artery origin. Single renal artery bilaterally. There appears to be at least 50% stenosis at the origin bilaterally. IMA: Inferior mesenteric artery remains patent, with atherosclerotic changes at the origin. Right lower extremity: Calcified and soft plaque of the common iliac artery. Chronic  dissection/irregular plaque through the length of the common iliac artery without occlusion. Greatest diameter measures 18 mm. Hypogastric artery remains patent. External iliac artery with mild atherosclerotic changes. Common femoral artery with mild atherosclerotic changes. Proximal profunda femoris and SFA patent. Left lower extremity: Calcified and soft plaque of the left common iliac artery. 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 Greatest diameter distally measures 19 mm. Hypogastric artery is patent. External iliac artery with mild atherosclerotic changes. Common femoral artery with mild atherosclerotic changes. Proximal profunda femoris and SFA patent. Veins: Unremarkable appearance of the venous system. Review of the MIP images confirms the above findings. NON-VASCULAR Hepatobiliary: Unremarkable appearance of the liver. Cholecystectomy Pancreas: Unremarkable appearance of the pancreas Spleen: Unremarkable spleen Adrenals/Urinary Tract: Unremarkable appearance of the adrenal glands Right: Multiple rounded lesions of the right kidney, similar to the prior. Intermediate density lesion at the inferior cortex medially measures 2.5 cm, unchanged from the comparison CT of 04/30/2016. Additional low-density lesions at the posterior cortex intermediate density lesion at the lateral cortex of the right kidney measures 2 cm on image 114 of series 6. Size and appearance unchanged. None of these lesions are completely characterized on the single phase CT. No hydronephrosis or nephrolithiasis. Unremarkable course of the right ureter. Cortical thinning. Left: Multiple low-density lesions of the left kidney none of these are completely characterized on the single phase CT study, with overall relative unchanged size and number of lesions. No hydronephrosis or nephrolithiasis. Unremarkable course of the left ureter. Cortical thinning. Unremarkable appearance of the urinary  bladder . Stomach/Bowel: Unremarkable appearance of stomach. Small hiatal hernia. There is ill-defined inflammation/edema within the fat adjacent to the proximal duodenum. No focal fluid collection. No extraluminal air. No abnormally distended small bowel.  No transition point. Moderate stool burden. Extensive diverticular change without evidence of acute inflammatory changes to suggest diverticulitis. No abnormally distended colon. Appendix is not visualized, however, no inflammatory changes are present adjacent to the cecum to indicate an appendicitis. Lymphatic: Multiple lymph nodes in the para-aortic nodal station, none of which are enlarged. Reproductive: Transverse diameter of the prostate measures 5 cm Other: Surgical changes of prior left inguinal hernia repair Musculoskeletal: Surgical changes of left hip arthroplasty. No acute displaced fracture. Degenerative changes of the bilateral sacroiliac joints. Degenerative changes of the lumbar spine. IMPRESSION: No acute vascular finding, with no evidence of acute aortic syndrome. Similar appearance of moderate atherosclerotic changes of the aorta, including irregular focal plaque/ectasia of several locations in the aortic arch and descending thoracic aorta. The focal outpouching of the aortic arch at the aortic isthmus is in a typical location for a ductus diverticulum, and this finding has been unchanged dating to the most remote comparison CT of 12/13/2013. Aortic Atherosclerosis (ICD10-I70.0). 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 Moderate to advanced atherosclerotic changes of the bilateral iliac arteries, with irregular plaque/chronic dissection and associated fusiform  aneurysms of the bilateral common iliac arteries, with no occlusion.  Questionable inflammatory changes adjacent to the proximal duodenum, new from the comparison abdominal CT. If there is concern for epigastric/back pain that may correlate to a possible duodenitis or groove pancreatitis, correlation with lab values and upper endoscopy may be useful. Left main and 3 vessel coronary artery disease. Diverticular disease without evidence of acute diverticulitis. Bilateral renal cortical thinning compatible with chronic medical renal disease. Multiple bilateral rounded/cystic renal lesions, none of which are completely characterized on this single phase CT. If there is reason for more accurate characterization, contrast-enhanced MRI or alternatively renal protocol CT may be considered. Trace bilateral pleural effusions. Signed, Dulcy Fanny. Earleen Newport, DO [facility] Radiology Electronically Signed   By: Corrie Mckusick D.O.   On: 12/15/2017 10:43    EKG:   Atrial fib 80 bpm  IMPRESSION AND PLAN:   1.  Chest pain with a dialysis patient and CAD patient.  Cardiomyopathy on last EKG with EF 25-30% with moderate mitral regurgitation.  Patient's first troponin is negative.  Will monitor on telemetry get serial cardiac enzymes.  Aspirin and metoprolol prescribed.  PRN nitroglycerin.  If cardiac enzymes remain negative potentially be able to get a stress test tomorrow.  Will get cardiology consultation.  Lipase negative. 2.  End-stage renal disease in a dialysis patient.  Patient received IV contrast for CT scan.  Case discussed with Dr. Candiss Norse to speak with the patient about dialysis. 3.  History of multiple myeloma 4.  Hypertension on metoprolol 5.  Hyperlipidemia unspecified on statin 6.  GERD on Protonix 7.  Atrial fibrillation on EKG.  On aspirin and metoprolol.  We will give some IV magnesium.  Cardiology consultation. 8.  Rhonchi in the lungs will give nebulizer treatments 9.  Tobacco abuse smoking cessation counseling done 4 minutes  by me.  Refused nicotine patch  All the records are reviewed and case discussed with ED provider. Management plans discussed with the patient, family and they are in agreement.  CODE STATUS: Full code  TOTAL TIME TAKING CARE OF THIS PATIENT: 50 minutes.    Loletha Grayer M.D on 12/15/2017 at 11:55 AM  Between 7am to 6pm - Pager - [phone]  After 6pm call admission pager [phone]  [employer] [facility] [facility]  [phone]  CC: Primary care physician; Frank Huang., MD

## 2017-12-15 NOTE — Progress Notes (Signed)
[facility], [place] 12/15/17  Subjective:   Patient known to our practice from previous admissions.  He presents to the emergency room from dialysis for chest pain during the treatment.  At present his chest pain is resolved.  He does not have any acute shortness of breath.  His initial cardiac enzymes are negative.  Patient finished only hour and a half of his treatment.  In the emergency room, patient underwent a CT angiogram which shows extensive atherosclerotic disease and bilateral renal cysts. Nephrology consult has been requested to arrange for dialysis   Objective:  Vital signs in last 24 hours:  Temp:  [97.4 F (36.3 C)] 97.4 F (36.3 C) (02/04 0830) Pulse Rate:  [70-75] 70 (02/04 1055) Resp:  [14-16] 16 (02/04 1055) BP: (103-112)/(58-73) 103/73 (02/04 1055) SpO2:  [94 %-100 %] 94 % (02/04 1055) Weight:  [87.5 kg (193 lb)] 87.5 kg (193 lb) (02/04 0828)  Weight change:  Filed Weights   12/15/17 0828  Weight: 87.5 kg (193 lb)    Intake/Output:   No intake or output data in the 24 hours ending 12/15/17 1214   Physical Exam: General:  Laying in the bed, no acute distress  HEENT  moist oral mucous membranes  Neck  supple  Pulm/lungs  normal breathing effort, clear to auscultation  CVS/Heart  regular, prominent systolic murmur  Abdomen:   Soft, nontender  Extremities:  Right leg 1+ edema  Neurologic:  Alert and oriented  Skin:  No acute rashes  Access:  Left arm AV fistula, good bruit and thrill       Basic Metabolic Panel:  Recent Labs  Lab 12/15/17 0828  NA 141  K 3.5  CL 97*  CO2 29  GLUCOSE 118*  BUN 31*  CREATININE 4.05*  CALCIUM 8.6*     CBC: Recent Labs  Lab 12/15/17 0828  WBC 3.4*  HGB 9.3*  HCT 28.0*  MCV 103.9*  PLT 101*      Lab Results  Component Value Date   HEPBSAG Negative 05/28/2017   HEPBSAB Reactive 01/29/2017      Microbiology:  No results found for this or any previous visit (from  the past 240 hour(s)).  Coagulation Studies: No results for input(s): LABPROT, INR in the last 72 hours.  Urinalysis: No results for input(s): COLORURINE, LABSPEC, PHURINE, GLUCOSEU, HGBUR, BILIRUBINUR, KETONESUR, PROTEINUR, UROBILINOGEN, NITRITE, LEUKOCYTESUR in the last 72 hours.  Invalid input(s): APPERANCEUR    Imaging: Dg Chest Port 1 View  Result Date: 12/15/2017 CLINICAL DATA:  Left-sided chest pain EXAM: PORTABLE CHEST 1 VIEW COMPARISON:  09/26/2017 FINDINGS: Cardiac shadow is mildly enlarged. Aortic calcifications are again seen. Increased density is noted to the lateral aspect of the aortic arch which may be projectional in nature although the possibility of an aortic abnormality cannot be totally excluded. The lungs are well aerated bilaterally. Left arm stent is noted. No bony abnormality is seen. IMPRESSION: Prominent aortic knob when compared with prior exams. This may be projectional in nature due to the portable technique and patient rotation although the possibility of acute aortic abnormality cannot be totally excluded. CTA of the chest is recommended for further evaluation. Critical Value/emergent results were called by telephone at the time of interpretation on 12/15/2017 at 9:05 am to Dr. Lenise Arena , who verbally acknowledged these results. Electronically Signed   By: [name] M.D.   On: 12/15/2017 09:10   Ct Angio Chest/abd/pel For Dissection W And/or Wo Contrast  Result Date:  12/15/2017 CLINICAL DATA:  84 year old male with a history of left-sided chest pain during dialysis EXAM: CT ANGIOGRAPHY CHEST, ABDOMEN AND PELVIS TECHNIQUE: Multidetector CT imaging through the chest, abdomen and pelvis was performed using the standard protocol during bolus administration of intravenous contrast. Multiplanar reconstructed images and MIPs were obtained and reviewed to evaluate the vascular anatomy. CONTRAST:  172m ISOVUE-370 IOPAMIDOL (ISOVUE-370) INJECTION 76% COMPARISON:   Multiple prior comparison CT, most recent chest CT 08/20/2016, 05/03/2016, most remote CT 12/13/2013 FINDINGS: CTA CHEST FINDINGS Cardiovascular: Heart: Heart size unchanged with borderline cardiomegaly. No pericardial fluid/thickening. Calcifications of left main, left anterior descending, circumflex, right coronary artery. Aorta: Noncontrast CT demonstrates no evidence of wall hematoma. Course caliber and contour unchanged from the comparison. Moderate to advanced atherosclerotic changes of the aortic arch and descending thoracic aorta, with mixed calcified and soft plaque. Similar configuration of irregular plaque at the aortic arch superolateral margin with mild ectasias. Greatest diameter of the aorta at this site measures 4.1 cm, which is essentially unchanged to the comparison CT of 08/20/2016. Re-demonstration of focal outpouching at the isthmus of the aorta, projecting inferiorly toward the pulmonary artery configuration is unchanged dating to the most remote comparison CT of 12/13/2013. No associated inflammatory changes. No periaortic fluid. Additional sites of irregular plaque of the descending thoracic aorta without aneurysm, dissection, or periaortic inflammatory change/fluid. Greatest diameter of the thoracic aorta at the aortic hiatus measures 2.9 cm. Pulmonary arteries: No central filling defects of the main pulmonary artery, lobar pulmonary arteries, or proximal segmental pulmonary arteries. Beyond this, timing of the contrast bolus limits the study. Mediastinum/Nodes: No mediastinal adenopathy. Unremarkable appearance of the thoracic esophagus. Unremarkable appearance of the thoracic inlet and thyroid. Lungs/Pleura: Central airways are clear. Trace bilateral pleural effusions no confluent airspace disease. No pneumothorax. Musculoskeletal: Multilevel degenerative changes of the thoracic spine. Accentuated kyphotic curvature at the thoracolumbar junction. Review of the MIP images confirms the above  findings. CTA ABDOMEN AND PELVIS FINDINGS VASCULAR Aorta: Moderate atherosclerotic changes of the abdominal aorta with mixed calcified and soft plaque. No circumferential plaque. No dissection of the abdominal aorta. No periaortic fluid. Greatest diameter of the infrarenal abdominal aorta at the level of the inferior mesenteric artery origin measuring 2.4 cm. Celiac: Atherosclerotic changes at the origin the celiac artery with what appears to be high-grade stenosis secondary to mixed calcified and soft plaque. Branch vessels are patent. SMA: Atherosclerotic changes at the origin the superior mesenteric artery, with no significant stenosis. Renals: Atherosclerotic changes at the bilateral renal artery origin. Single renal artery bilaterally. There appears to be at least 50% stenosis at the origin bilaterally. IMA: Inferior mesenteric artery remains patent, with atherosclerotic changes at the origin. Right lower extremity: Calcified and soft plaque of the common iliac artery. 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 Greatest diameter measures 18 mm. Hypogastric artery remains patent. External iliac artery with mild atherosclerotic changes. Common femoral artery with mild atherosclerotic changes. Proximal profunda femoris and SFA patent. Left lower extremity: Calcified and soft plaque of the left common iliac artery. 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 Greatest diameter distally measures 19 mm. Hypogastric artery is patent. External iliac artery with mild atherosclerotic changes. Common femoral artery with mild atherosclerotic changes. Proximal profunda femoris and SFA patent. Veins: Unremarkable appearance of the venous system. Review of the MIP images confirms the above findings. NON-VASCULAR Hepatobiliary: Unremarkable appearance of the liver. Cholecystectomy Pancreas: Unremarkable appearance of the  pancreas  Spleen: Unremarkable spleen Adrenals/Urinary Tract: Unremarkable appearance of the adrenal glands Right: Multiple rounded lesions of the right kidney, similar to the prior. Intermediate density lesion at the inferior cortex medially measures 2.5 cm, unchanged from the comparison CT of 04/30/2016. Additional low-density lesions at the posterior cortex intermediate density lesion at the lateral cortex of the right kidney measures 2 cm on image 114 of series 6. Size and appearance unchanged. None of these lesions are completely characterized on the single phase CT. No hydronephrosis or nephrolithiasis. Unremarkable course of the right ureter. Cortical thinning. Left: Multiple low-density lesions of the left kidney none of these are completely characterized on the single phase CT study, with overall relative unchanged size and number of lesions. No hydronephrosis or nephrolithiasis. Unremarkable course of the left ureter. Cortical thinning. Unremarkable appearance of the urinary bladder . Stomach/Bowel: Unremarkable appearance of stomach. Small hiatal hernia. There is ill-defined inflammation/edema within the fat adjacent to the proximal duodenum. No focal fluid collection. No extraluminal air. No abnormally distended small bowel.  No transition point. Moderate stool burden. Extensive diverticular change without evidence of acute inflammatory changes to suggest diverticulitis. No abnormally distended colon. Appendix is not visualized, however, no inflammatory changes are present adjacent to the cecum to indicate an appendicitis. Lymphatic: Multiple lymph nodes in the para-aortic nodal station, none of which are enlarged. Reproductive: Transverse diameter of the prostate measures 5 cm Other: Surgical changes of prior left inguinal hernia repair Musculoskeletal: Surgical changes of left hip arthroplasty. No acute displaced fracture. Degenerative changes of the bilateral sacroiliac joints. Degenerative changes of  the lumbar spine. IMPRESSION: No acute vascular finding, with no evidence of acute aortic syndrome. Similar appearance of moderate atherosclerotic changes of the aorta, including irregular focal plaque/ectasia of several locations in the aortic arch and descending thoracic aorta. The focal outpouching of the aortic arch at the aortic isthmus is in a typical location for a ductus diverticulum, and this finding has been unchanged dating to the most remote comparison CT of 12/13/2013. Aortic Atherosclerosis (ICD10-I70.0). 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 Questionable inflammatory changes adjacent to the proximal duodenum, new from the comparison abdominal CT. If there is concern for epigastric/back pain that may correlate to a possible duodenitis or groove pancreatitis, correlation with lab values and upper endoscopy may be useful. Left main and 3 vessel coronary artery disease. Diverticular disease without evidence of acute diverticulitis. Bilateral renal cortical thinning compatible with chronic medical renal disease. Multiple bilateral rounded/cystic renal lesions, none of which are completely characterized on this single phase CT. If there is reason for more accurate characterization, contrast-enhanced MRI or alternatively renal protocol CT may be considered. Trace bilateral pleural effusions. Signed, Dulcy Fanny. Earleen Newport, DO [facility] Electronically Signed   By: Corrie Mckusick D.O.   On: 12/15/2017 10:43     Medications:   .  magnesium sulfate 1 - 4 g bolus IVPB     . aspirin EC  81 mg Oral Daily  . budesonide (PULMICORT) nebulizer solution  0.5 mg Nebulization BID  . calcitRIOL  0.5 mcg Oral Once per day on Mon Wed Fri  . donepezil  10  mg Oral QHS  . [START ON 12/16/2017] furosemide  80 mg Oral Q T,Th,S,Su  . heparin  5,000 Units Subcutaneous Q8H  . ipratropium-albuterol  3 mL Nebulization Q6H  . metoprolol tartrate  12.5 mg Oral BID  . MULTI-VITAMINS  1 tablet Oral Daily  . pantoprazole  40 mg Oral Daily  . pravastatin  40 mg Oral q1800  . sevelamer carbonate  1,600 mg Oral TID  . sucralfate  1 g Oral TID AC & HS   acetaminophen **OR** acetaminophen, lidocaine-prilocaine, nitroGLYCERIN, traMADol  Assessment/ Plan:  84 y.o. male with ESRD on hemodialysis, smoldering multiple myeloma, followed by Dr. Mike Gip [facility] the [facility], CAD, CVA, COPD , A fib   [facility]/ [facility]/ MWF  1.  End-stage renal disease 2.  Anemia of chronic kidney disease 3.  Secondary hyperparathyroidism 4.  Chest pain  Plan: We will arrange for partial hemodialysis treatment today to complete his dialysis time Minimal ultrafiltration as blood pressure is low normal and patient has minimal amount of edema Continue sevelamer and calcitriol at home doses Will continue Procrit for anemia.      LOS: 0 Nefi Musich Candiss Norse 2/4/201912:14 PM  [facility] [place], [place]

## 2017-12-15 NOTE — Consult Note (Signed)
Reason for Consult: Unstable angina coronary disease borderline troponin Referring Physician: Dr. Miguel Aschoff primary, Dr. Loletha Grayer Cardiology Dr. Bartholome Bill  Frank Huang is an 84 y.o. male.  HPI: Patient 84 year old white male history of end-stage renal disease multiple myeloma presented to the emergency room because of while doing dialysis today started having significant chest discomfort at left sternal lasting about 30 minutes relatively intense 9 out of 10.  Patient states it lasted about 20-30 minutes denies any shortness of breath he has had some occasionally denies any blackout spells or syncope.  Patient has known coronary disease with moderate to severe disease in L proximal LAD occluded circumflex and right patient because of persistent anginal system patient was referred for cardiac evaluation.  Past Medical History:  Diagnosis Date  . Cancer ([place])   . Chronic kidney disease   . COPD (chronic obstructive pulmonary disease) ([place])   . Coronary artery disease   . GERD (gastroesophageal reflux disease)   . Hypertension   . Multiple myeloma ([place])   . Myocardial infarction ([place]) 2005  . Neuropathy   . Shortness of breath dyspnea   . Stroke (cerebrum) ([facility])    weakness Lt hand  . Systolic CHF [facility])     Past Surgical History:  Procedure Laterality Date  . APPENDECTOMY    . AV FISTULA PLACEMENT Left   . CARDIAC CATHETERIZATION N/A 11/15/2016   Procedure: Left Heart Cath and Coronary Angiography;  Surgeon: Isaias Cowman, MD;  Location: [facility];  Service: Cardiovascular;  Laterality: N/A;  . CHOLECYSTECTOMY    . SHOULDER ARTHROSCOPY WITH OPEN ROTATOR CUFF REPAIR Left 09/19/2015   Procedure: SHOULDER ARTHROSCOPY , subacromial decompression, debridement;  Surgeon: Corky Mull, MD;  Location: [facility];  Service: Orthopedics;  Laterality: Left;  . TOTAL HIP ARTHROPLASTY Left     Family History  Problem Relation Age of Onset  . Alzheimer's disease Mother    . Kidney failure Father   . Bone cancer Sister   . Stomach cancer Sister     Social History:  reports that he has been smoking cigarettes.  He has been smoking about 0.50 packs per day. he has never used smokeless tobacco. He reports that he does not drink alcohol or use drugs.  Allergies: No Known Allergies  Medications: I have reviewed the patient's current medications.  Results for orders placed or performed during the hospital encounter of 12/15/17 (from the past 48 hour(s))  Basic metabolic panel     Status: Abnormal   Collection Time: 12/15/17  8:28 AM  Result Value Ref Range   Sodium 141 135 - 145 mmol/L   Potassium 3.5 3.5 - 5.1 mmol/L   Chloride 97 (L) 101 - 111 mmol/L   CO2 29 22 - 32 mmol/L   Glucose, Bld 118 (H) 65 - 99 mg/dL   BUN 31 (H) 6 - 20 mg/dL   Creatinine, Ser 4.05 (H) 0.61 - 1.24 mg/dL   Calcium 8.6 (L) 8.9 - 10.3 mg/dL   GFR calc non Af Amer 12 (L) >60 mL/min   GFR calc Af Amer 14 (L) >60 mL/min    Comment: (NOTE) The eGFR has been calculated using the CKD EPI equation. This calculation has not been validated in all clinical situations. eGFR's persistently <60 mL/min signify possible Chronic Kidney Disease.    Anion gap 15 5 - 15    Comment: Performed at [facility], [address] [place], [place] [address]  CBC  Status: Abnormal   Collection Time: 12/15/17  8:28 AM  Result Value Ref Range   WBC 3.4 (L) 3.8 - 10.6 K/uL   RBC 2.69 (L) 4.40 - 5.90 MIL/uL   Hemoglobin 9.3 (L) 13.0 - 18.0 g/dL   HCT 28.0 (L) 40.0 - 52.0 %   MCV 103.9 (H) 80.0 - 100.0 fL   MCH 34.5 (H) 26.0 - 34.0 pg   MCHC 33.2 32.0 - 36.0 g/dL   RDW 16.8 (H) 11.5 - 14.5 %   Platelets 101 (L) 150 - 440 K/uL    Comment: Performed at [facility], [address] [place], [place] [address]  Troponin I     Status: Abnormal   Collection Time: 12/15/17  8:28 AM  Result Value Ref Range   Troponin I 0.03 (HH) <0.03 ng/mL    Comment: CRITICAL RESULT CALLED TO,  READ BACK BY AND VERIFIED WITH JANE RYAN ON 12/15/17 AT 8546 QSD Performed at [facility], [address] [place], [place] [address]   Lipase, blood     Status: None   Collection Time: 12/15/17  8:28 AM  Result Value Ref Range   Lipase 43 11 - 51 U/L    Comment: Performed at [facility], [address] [place], [place] [address]  Troponin I     Status: None   Collection Time: 12/15/17  1:00 PM  Result Value Ref Range   Troponin I <0.03 <0.03 ng/mL    Comment: Performed at [facility], [address] [place],  [address]    Dg Chest Port 1 View  Result Date: 12/15/2017 CLINICAL DATA:  Left-sided chest pain EXAM: PORTABLE CHEST 1 VIEW COMPARISON:  09/26/2017 FINDINGS: Cardiac shadow is mildly enlarged. Aortic calcifications are again seen. Increased density is noted to the lateral aspect of the aortic arch which may be projectional in nature although the possibility of an aortic abnormality cannot be totally excluded. The lungs are well aerated bilaterally. Left arm stent is noted. No bony abnormality is seen. IMPRESSION: Prominent aortic knob when compared with prior exams. This may be projectional in nature due to the portable technique and patient rotation although the possibility of acute aortic abnormality cannot be totally excluded. CTA of the chest is recommended for further evaluation. Critical Value/emergent results were called by telephone at the time of interpretation on 12/15/2017 at 9:05 am to Dr. Lenise Arena , who verbally acknowledged these results. Electronically Signed   By: [name] M.D.   On: 12/15/2017 09:10   Ct Angio Chest/abd/pel For Dissection W And/or Wo Contrast  Result Date: 12/15/2017 CLINICAL DATA:  84 year old male with a history of left-sided chest pain during dialysis EXAM: CT ANGIOGRAPHY CHEST, ABDOMEN AND PELVIS TECHNIQUE: Multidetector CT imaging through the chest, abdomen and pelvis was performed using the standard  protocol during bolus administration of intravenous contrast. Multiplanar reconstructed images and MIPs were obtained and reviewed to evaluate the vascular anatomy. CONTRAST:  146m ISOVUE-370 IOPAMIDOL (ISOVUE-370) INJECTION 76% COMPARISON:  Multiple prior comparison CT, most recent chest CT 08/20/2016, 05/03/2016, most remote CT 12/13/2013 FINDINGS: CTA CHEST FINDINGS Cardiovascular: Heart: Heart size unchanged with borderline cardiomegaly. No pericardial fluid/thickening. Calcifications of left main, left anterior descending, circumflex, right coronary artery. Aorta: Noncontrast CT demonstrates no evidence of wall hematoma. Course caliber and contour unchanged from the comparison. Moderate to advanced atherosclerotic changes of the aortic arch and descending thoracic aorta, with mixed calcified and soft plaque. Similar configuration of irregular plaque at the aortic  arch superolateral margin with mild ectasias. Greatest diameter of the aorta at this site measures 4.1 cm, which is essentially unchanged to the comparison CT of 08/20/2016. Re-demonstration of focal outpouching at the isthmus of the aorta, projecting inferiorly toward the pulmonary artery configuration is unchanged dating to the most remote comparison CT of 12/13/2013. No associated inflammatory changes. No periaortic fluid. Additional sites of irregular plaque of the descending thoracic aorta without aneurysm, dissection, or periaortic inflammatory change/fluid. Greatest diameter of the thoracic aorta at the aortic hiatus measures 2.9 cm. Pulmonary arteries: No central filling defects of the main pulmonary artery, lobar pulmonary arteries, or proximal segmental pulmonary arteries. Beyond this, timing of the contrast bolus limits the study. Mediastinum/Nodes: No mediastinal adenopathy. Unremarkable appearance of the thoracic esophagus. Unremarkable appearance of the thoracic inlet and thyroid. Lungs/Pleura: Central airways are clear. Trace bilateral  pleural effusions no confluent airspace disease. No pneumothorax. Musculoskeletal: Multilevel degenerative changes of the thoracic spine. Accentuated kyphotic curvature at the thoracolumbar junction. Review of the MIP images confirms the above findings. CTA ABDOMEN AND PELVIS FINDINGS VASCULAR Aorta: Moderate atherosclerotic changes of the abdominal aorta with mixed calcified and soft plaque. No circumferential plaque. No dissection of the abdominal aorta. No periaortic fluid. Greatest diameter of the infrarenal abdominal aorta at the level of the inferior mesenteric artery origin measuring 2.4 cm. Celiac: Atherosclerotic changes at the origin the celiac artery with what appears to be high-grade stenosis secondary to mixed calcified and soft plaque. Branch vessels are patent. SMA: Atherosclerotic changes at the origin the superior mesenteric artery, with no significant stenosis. Renals: Atherosclerotic changes at the bilateral renal artery origin. Single renal artery bilaterally. There appears to be at least 50% stenosis at the origin bilaterally. IMA: Inferior mesenteric artery remains patent, with atherosclerotic changes at the origin. Right lower extremity: Calcified and soft plaque of the common iliac artery. 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 Greatest diameter measures 18 mm. Hypogastric artery remains patent. External iliac artery with mild atherosclerotic changes. Common femoral artery with mild atherosclerotic changes. Proximal profunda femoris and SFA patent. Left lower extremity: Calcified and soft plaque of the left common iliac artery. 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 Greatest diameter distally measures 19 mm. Hypogastric artery is patent. External iliac artery with mild atherosclerotic changes. Common femoral artery with mild atherosclerotic changes. Proximal profunda femoris and SFA  patent. Veins: Unremarkable appearance of the venous system. Review of the MIP images confirms the above findings. NON-VASCULAR Hepatobiliary: Unremarkable appearance of the liver. Cholecystectomy Pancreas: Unremarkable appearance of the pancreas Spleen: Unremarkable spleen Adrenals/Urinary Tract: Unremarkable appearance of the adrenal glands Right: Multiple rounded lesions of the right kidney, similar to the prior. Intermediate density lesion at the inferior cortex medially measures 2.5 cm, unchanged from the comparison CT of 04/30/2016. Additional low-density lesions at the posterior cortex intermediate density lesion at the lateral cortex of the right kidney measures 2 cm on image 114 of series 6. Size and appearance unchanged. None of these lesions are completely characterized on the single phase CT. No hydronephrosis or nephrolithiasis. Unremarkable course of the right ureter. Cortical thinning. Left: Multiple low-density lesions of the left kidney none of these are completely characterized on the single phase CT study, with overall relative unchanged size and number of lesions. No hydronephrosis or nephrolithiasis. Unremarkable course of the left ureter. Cortical thinning. Unremarkable appearance of the urinary bladder . Stomach/Bowel: Unremarkable appearance of stomach. Small hiatal hernia. There  is ill-defined inflammation/edema within the fat adjacent to the proximal duodenum. No focal fluid collection. No extraluminal air. No abnormally distended small bowel.  No transition point. Moderate stool burden. Extensive diverticular change without evidence of acute inflammatory changes to suggest diverticulitis. No abnormally distended colon. Appendix is not visualized, however, no inflammatory changes are present adjacent to the cecum to indicate an appendicitis. Lymphatic: Multiple lymph nodes in the para-aortic nodal station, none of which are enlarged. Reproductive: Transverse diameter of the prostate  measures 5 cm Other: Surgical changes of prior left inguinal hernia repair Musculoskeletal: Surgical changes of left hip arthroplasty. No acute displaced fracture. Degenerative changes of the bilateral sacroiliac joints. Degenerative changes of the lumbar spine. IMPRESSION: No acute vascular finding, with no evidence of acute aortic syndrome. Similar appearance of moderate atherosclerotic changes of the aorta, including irregular focal plaque/ectasia of several locations in the aortic arch and descending thoracic aorta. The focal outpouching of the aortic arch at the aortic isthmus is in a typical location for a ductus diverticulum, and this finding has been unchanged dating to the most remote comparison CT of 12/13/2013. Aortic Atherosclerosis (ICD10-I70.0). 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 Questionable inflammatory changes adjacent to the proximal duodenum, new from the comparison abdominal CT. If there is concern for epigastric/back pain that may correlate to a possible duodenitis or groove pancreatitis, correlation with lab values and upper endoscopy may be useful. Left main and 3 vessel coronary artery disease. Diverticular disease without evidence of acute diverticulitis. Bilateral renal cortical thinning compatible with chronic medical renal disease. Multiple bilateral rounded/cystic renal lesions, none of which are completely characterized on this single phase CT. If there is reason for more accurate characterization, contrast-enhanced MRI or alternatively renal protocol CT may be  considered. Trace bilateral pleural effusions. Signed, Dulcy Fanny. Earleen Newport, DO [facility] Electronically Signed   By: Corrie Mckusick D.O.   On: 12/15/2017 10:43    Review of Systems  HENT: Negative.   Eyes: Negative.   Respiratory: Positive for shortness of breath.   Cardiovascular: Positive for chest pain.  Gastrointestinal: Negative.   Genitourinary: Negative.   Musculoskeletal: Positive for myalgias.  Skin: Negative.   Neurological: Positive for weakness.  Endo/Heme/Allergies: Negative.   Psychiatric/Behavioral: Negative.    Blood pressure (!) 98/53, pulse 70, temperature (P) 97.8 F (36.6 C), temperature source (P) Oral, resp. rate (!) 22, height 6' 3"  (1.905 m), weight 193 lb (87.5 kg), SpO2 100 %. Physical Exam  Assessment/Plan: Unstable angina Coronary artery disease End-stage renal disease on dialysis PCI and stent in the past Multiple myeloma COPD GERD Shortness of breath Congestive heart failure systolic dysfunction compensated CVA Anemia . Plan Agree with admission to telemetry rule out for myocardial infarction Follow-up elevated troponins Continue dialysis for end-stage renal failure Known coronary disease now with possible angina Agree with inhaler therapy consider follow-up with pulmonary Continue Pravachol therapy for lipid management Continue current therapy for multiple myeloma follow-up with hematology Secondary hyperparathyroidism consider evaluation by endocrine Consider functional study versus cardiac cath   Rocko Fesperman D Takiah Maiden 12/15/2017, 3:53 PM

## 2017-12-16 ENCOUNTER — Encounter: Payer: Self-pay | Admitting: Radiology

## 2017-12-16 ENCOUNTER — Inpatient Hospital Stay: Payer: Medicare Other

## 2017-12-16 LAB — CBC
HCT: 26.1 % — ABNORMAL LOW (ref 40.0–52.0)
HEMOGLOBIN: 8.6 g/dL — AB (ref 13.0–18.0)
MCH: 33.9 pg (ref 26.0–34.0)
MCHC: 32.9 g/dL (ref 32.0–36.0)
MCV: 102.8 fL — ABNORMAL HIGH (ref 80.0–100.0)
Platelets: 84 10*3/uL — ABNORMAL LOW (ref 150–440)
RBC: 2.54 MIL/uL — AB (ref 4.40–5.90)
RDW: 16.7 % — ABNORMAL HIGH (ref 11.5–14.5)
WBC: 3.1 10*3/uL — AB (ref 3.8–10.6)

## 2017-12-16 LAB — BASIC METABOLIC PANEL
ANION GAP: 10 (ref 5–15)
BUN: 34 mg/dL — ABNORMAL HIGH (ref 6–20)
CO2: 27 mmol/L (ref 22–32)
Calcium: 8.9 mg/dL (ref 8.9–10.3)
Chloride: 100 mmol/L — ABNORMAL LOW (ref 101–111)
Creatinine, Ser: 4.33 mg/dL — ABNORMAL HIGH (ref 0.61–1.24)
GFR, EST AFRICAN AMERICAN: 13 mL/min — AB (ref >60)
GFR, EST NON AFRICAN AMERICAN: 11 mL/min — AB (ref >60)
GLUCOSE: 83 mg/dL (ref 65–99)
POTASSIUM: 4.5 mmol/L (ref 3.5–5.1)
SODIUM: 137 mmol/L (ref 135–145)

## 2017-12-16 MED ORDER — SODIUM CHLORIDE 0.9 % IV SOLN: 250.0000 mL | INTRAVENOUS | Status: DC | PRN

## 2017-12-16 MED ORDER — SODIUM CHLORIDE 0.9 % WEIGHT BASED INFUSION: 3.0000 mL/kg/h | INTRAVENOUS | Status: AC

## 2017-12-16 MED ORDER — SODIUM CHLORIDE 0.9% FLUSH: 3.0000 mL | INTRAVENOUS | Status: DC | PRN

## 2017-12-16 MED ORDER — SODIUM CHLORIDE 0.9% FLUSH
3.0000 mL | Freq: Two times a day (BID) | INTRAVENOUS | Status: DC
  Administered 2017-12-16: 3 mL via INTRAVENOUS

## 2017-12-16 MED ORDER — SODIUM CHLORIDE 0.9 % WEIGHT BASED INFUSION
1.0000 mL/kg/h | INTRAVENOUS | Status: DC
  Administered 2017-12-17: 0.233 mL/kg/h via INTRAVENOUS

## 2017-12-16 MED ORDER — ASPIRIN 81 MG PO CHEW
81.0000 mg | CHEWABLE_TABLET | ORAL | Status: AC
  Administered 2017-12-17: 81 mg via ORAL
  Filled 2017-12-16: qty 1

## 2017-12-16 MED ORDER — MORPHINE SULFATE (PF) 2 MG/ML IV SOLN
2.0000 mg | Freq: Once | INTRAVENOUS | Status: AC
  Administered 2017-12-16: 2 mg via INTRAVENOUS
  Filled 2017-12-16: qty 1

## 2017-12-16 MED ORDER — TECHNETIUM TC 99M TETROFOSMIN IV KIT
13.5170 | PACK | Freq: Once | INTRAVENOUS | Status: AC | PRN
  Administered 2017-12-16: 13.517 via INTRAVENOUS

## 2017-12-16 NOTE — Progress Notes (Signed)
[place], [place] 12/16/17  Subjective:   Patient family in room at time of interview. Patient does not report current chest pain or difficulty breathing.   Patient recounts his chest pain episode at the time of dialysis. He reports that he had an initial and small, sharp chest pain that lasted a couple of seconds, followed by a larger and sharp pain that lasted a little longer and turned into pressure that increased with deep breaths but did not alleviate with change in position. Both times, the chest pain was located to the left of his sternum, and it did not radiate. The chest pain and pressure was accompanied by a numbness along the posterior aspect of his left upper arm and lasted approximately 20 minutes.   The patient reports that this chest pain feels similar to his MI (2005) but states he did not have pressure with the pain in 2005. He states he has been eating, drinking, and sleeping well. He takes his medications as prescribed and sees Dr. Ubaldo Glassing in cardiology.   Patient states he is a current smoker (0.5 Pk/day) but does not drink.  Of note, family reports that patient has been "stumbling around lately, which is new." Patient states he feels light headed for a few seconds throughout the day.  No chest pain during dialysis yesterday evening (2/4).   Objective:  Vital signs in last 24 hours:  Temp:  [97.6 F (36.4 C)-98.4 F (36.9 C)] 97.7 F (36.5 C) (02/05 1533) Pulse Rate:  [58-69] 69 (02/05 1533) Resp:  [16-18] 16 (02/05 1533) BP: (82-118)/(47-72) 118/62 (02/05 1533) SpO2:  [95 %-100 %] 100 % (02/05 1533) Weight:  [86 kg (189 lb 11.2 oz)] 86 kg (189 lb 11.2 oz) (02/05 0339)  Weight change:  Filed Weights   12/15/17 1341 12/15/17 1540 12/16/17 0339  Weight: 87.5 kg (192 lb 14.4 oz) 87.6 kg (193 lb 2 oz) 86 kg (189 lb 11.2 oz)    Intake/Output:    Intake/Output Summary (Last 24 hours) at 12/16/2017 1556 Last data filed at 12/16/2017  0347 Gross per 24 hour  Intake 290 ml  Output 62 ml  Net 228 ml     Physical Exam: General:  Laying in the bed, no acute distress  HEENT  moist oral mucous membranes  Neck  supple.   Pulm/lungs  normal breathing effort, clear to auscultation b/l  CVS/Heart  irregularly irregular, prominent systolic murmur  Abdomen:   Soft, nontender  Extremities:  Right leg with trace edema  Neurologic:  Alert and oriented.   Skin:  No acute rashes  Access:  Left arm AV fistula, good bruit and thrill       Basic Metabolic Panel:  Recent Labs  Lab 12/15/17 0828 12/16/17 0529  NA 141 137  K 3.5 4.5  CL 97* 100*  CO2 29 27  GLUCOSE 118* 83  BUN 31* 34*  CREATININE 4.05* 4.33*  CALCIUM 8.6* 8.9     CBC: Recent Labs  Lab 12/15/17 0828 12/16/17 0529  WBC 3.4* 3.1*  HGB 9.3* 8.6*  HCT 28.0* 26.1*  MCV 103.9* 102.8*  PLT 101* 84*      Lab Results  Component Value Date   HEPBSAG Negative 05/28/2017   HEPBSAB Reactive 01/29/2017      Microbiology:  No results found for this or any previous visit (from the past 240 hour(s)).  Coagulation Studies: No results for input(s): LABPROT, INR in the last 72 hours.  Urinalysis: No results for  input(s): COLORURINE, LABSPEC, PHURINE, GLUCOSEU, HGBUR, BILIRUBINUR, KETONESUR, PROTEINUR, UROBILINOGEN, NITRITE, LEUKOCYTESUR in the last 72 hours.  Invalid input(s): APPERANCEUR    Imaging: Dg Chest Port 1 View  Result Date: 12/15/2017 CLINICAL DATA:  Left-sided chest pain EXAM: PORTABLE CHEST 1 VIEW COMPARISON:  09/26/2017 FINDINGS: Cardiac shadow is mildly enlarged. Aortic calcifications are again seen. Increased density is noted to the lateral aspect of the aortic arch which may be projectional in nature although the possibility of an aortic abnormality cannot be totally excluded. The lungs are well aerated bilaterally. Left arm stent is noted. No bony abnormality is seen. IMPRESSION: Prominent aortic knob when compared with prior  exams. This may be projectional in nature due to the portable technique and patient rotation although the possibility of acute aortic abnormality cannot be totally excluded. CTA of the chest is recommended for further evaluation. Critical Value/emergent results were called by telephone at the time of interpretation on 12/15/2017 at 9:05 am to Dr. Lenise Arena , who verbally acknowledged these results. Electronically Signed   By: [name] M.D.   On: 12/15/2017 09:10   Ct Angio Chest/abd/pel For Dissection W And/or Wo Contrast  Result Date: 12/15/2017 CLINICAL DATA:  84 year old male with a history of left-sided chest pain during dialysis EXAM: CT ANGIOGRAPHY CHEST, ABDOMEN AND PELVIS TECHNIQUE: Multidetector CT imaging through the chest, abdomen and pelvis was performed using the standard protocol during bolus administration of intravenous contrast. Multiplanar reconstructed images and MIPs were obtained and reviewed to evaluate the vascular anatomy. CONTRAST:  149m ISOVUE-370 IOPAMIDOL (ISOVUE-370) INJECTION 76% COMPARISON:  Multiple prior comparison CT, most recent chest CT 08/20/2016, 05/03/2016, most remote CT 12/13/2013 FINDINGS: CTA CHEST FINDINGS Cardiovascular: Heart: Heart size unchanged with borderline cardiomegaly. No pericardial fluid/thickening. Calcifications of left main, left anterior descending, circumflex, right coronary artery. Aorta: Noncontrast CT demonstrates no evidence of wall hematoma. Course caliber and contour unchanged from the comparison. Moderate to advanced atherosclerotic changes of the aortic arch and descending thoracic aorta, with mixed calcified and soft plaque. Similar configuration of irregular plaque at the aortic arch superolateral margin with mild ectasias. Greatest diameter of the aorta at this site measures 4.1 cm, which is essentially unchanged to the comparison CT of 08/20/2016. Re-demonstration of focal outpouching at the isthmus of the aorta, projecting  inferiorly toward the pulmonary artery configuration is unchanged dating to the most remote comparison CT of 12/13/2013. No associated inflammatory changes. No periaortic fluid. Additional sites of irregular plaque of the descending thoracic aorta without aneurysm, dissection, or periaortic inflammatory change/fluid. Greatest diameter of the thoracic aorta at the aortic hiatus measures 2.9 cm. Pulmonary arteries: No central filling defects of the main pulmonary artery, lobar pulmonary arteries, or proximal segmental pulmonary arteries. Beyond this, timing of the contrast bolus limits the study. Mediastinum/Nodes: No mediastinal adenopathy. Unremarkable appearance of the thoracic esophagus. Unremarkable appearance of the thoracic inlet and thyroid. Lungs/Pleura: Central airways are clear. Trace bilateral pleural effusions no confluent airspace disease. No pneumothorax. Musculoskeletal: Multilevel degenerative changes of the thoracic spine. Accentuated kyphotic curvature at the thoracolumbar junction. Review of the MIP images confirms the above findings. CTA ABDOMEN AND PELVIS FINDINGS VASCULAR Aorta: Moderate atherosclerotic changes of the abdominal aorta with mixed calcified and soft plaque. No circumferential plaque. No dissection of the abdominal aorta. No periaortic fluid. Greatest diameter of the infrarenal abdominal aorta at the level of the inferior mesenteric artery origin measuring 2.4 cm. Celiac: Atherosclerotic changes at the origin the celiac artery with what appears to be high-grade  stenosis secondary to mixed calcified and soft plaque. Branch vessels are patent. SMA: Atherosclerotic changes at the origin the superior mesenteric artery, with no significant stenosis. Renals: Atherosclerotic changes at the bilateral renal artery origin. Single renal artery bilaterally. There appears to be at least 50% stenosis at the origin bilaterally. IMA: Inferior mesenteric artery remains patent, with atherosclerotic  changes at the origin. Right lower extremity: Calcified and soft plaque of the common iliac artery. 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 Greatest diameter measures 18 mm. Hypogastric artery remains patent. External iliac artery with mild atherosclerotic changes. Common femoral artery with mild atherosclerotic changes. Proximal profunda femoris and SFA patent. Left lower extremity: Calcified and soft plaque of the left common iliac artery. 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 Greatest diameter distally measures 19 mm. Hypogastric artery is patent. External iliac artery with mild atherosclerotic changes. Common femoral artery with mild atherosclerotic changes. Proximal profunda femoris and SFA patent. Veins: Unremarkable appearance of the venous system. Review of the MIP images confirms the above findings. NON-VASCULAR Hepatobiliary: Unremarkable appearance of the liver. Cholecystectomy Pancreas: Unremarkable appearance of the pancreas Spleen: Unremarkable spleen Adrenals/Urinary Tract: Unremarkable appearance of the adrenal glands Right: Multiple rounded lesions of the right kidney, similar to the prior. Intermediate density lesion at the inferior cortex medially measures 2.5 cm, unchanged from the comparison CT of 04/30/2016. Additional low-density lesions at the posterior cortex intermediate density lesion at the lateral cortex of the right kidney measures 2 cm on image 114 of series 6. Size and appearance unchanged. None of these lesions are completely characterized on the single phase CT. No hydronephrosis or nephrolithiasis. Unremarkable course of the right ureter. Cortical thinning. Left: Multiple low-density lesions of the left kidney none of these are completely characterized on the single phase CT study, with overall relative unchanged size and number of lesions. No hydronephrosis or  nephrolithiasis. Unremarkable course of the left ureter. Cortical thinning. Unremarkable appearance of the urinary bladder . Stomach/Bowel: Unremarkable appearance of stomach. Small hiatal hernia. There is ill-defined inflammation/edema within the fat adjacent to the proximal duodenum. No focal fluid collection. No extraluminal air. No abnormally distended small bowel.  No transition point. Moderate stool burden. Extensive diverticular change without evidence of acute inflammatory changes to suggest diverticulitis. No abnormally distended colon. Appendix is not visualized, however, no inflammatory changes are present adjacent to the cecum to indicate an appendicitis. Lymphatic: Multiple lymph nodes in the para-aortic nodal station, none of which are enlarged. Reproductive: Transverse diameter of the prostate measures 5 cm Other: Surgical changes of prior left inguinal hernia repair Musculoskeletal: Surgical changes of left hip arthroplasty. No acute displaced fracture. Degenerative changes of the bilateral sacroiliac joints. Degenerative changes of the lumbar spine. IMPRESSION: No acute vascular finding, with no evidence of acute aortic syndrome. Similar appearance of moderate atherosclerotic changes of the aorta, including irregular focal plaque/ectasia of several locations in the aortic arch and descending thoracic aorta. The focal outpouching of the aortic arch at the aortic isthmus is in a typical location for a ductus diverticulum, and this finding has been unchanged dating to the most remote comparison CT of 12/13/2013. Aortic Atherosclerosis (ICD10-I70.0). 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 Questionable inflammatory changes adjacent to the proximal duodenum, new from the comparison abdominal CT. If there is concern for epigastric/back pain that may correlate to a possible duodenitis or groove pancreatitis, correlation with lab values and upper endoscopy may be useful. Left main and 3 vessel coronary artery disease. Diverticular disease without evidence of acute diverticulitis. Bilateral renal cortical thinning compatible with chronic medical renal disease. Multiple bilateral rounded/cystic renal lesions, none of which are completely characterized on this single phase CT. If there is reason for more accurate characterization, contrast-enhanced MRI or alternatively renal protocol CT may be considered. Trace bilateral pleural effusions. Signed, Dulcy Fanny. Earleen Newport, DO [facility] Electronically Signed   By: Corrie Mckusick D.O.   On: 12/15/2017 10:43     Medications:    . aspirin EC  81 mg Oral Daily  . budesonide (PULMICORT) nebulizer solution  0.5 mg Nebulization BID  . calcitRIOL  0.5 mcg Oral Once per day on Mon Wed Fri  . donepezil  10 mg Oral QHS  . furosemide  80 mg Oral Q T,Th,S,Su  . heparin  5,000 Units Subcutaneous Q8H  . ipratropium-albuterol  3 mL Nebulization Q6H  . metoprolol tartrate  12.5 mg Oral BID  . multivitamin with minerals  1 tablet Oral Daily  . pantoprazole  40 mg Oral Daily  . pravastatin  40 mg Oral q1800  . sevelamer carbonate  1,600 mg Oral TID WC  . sucralfate  1 g Oral TID AC & HS   acetaminophen **OR** acetaminophen, alum & mag hydroxide-simeth, lidocaine-prilocaine, nitroGLYCERIN, traMADol  Assessment/ Plan:  84 y.o. male with ESRD on hemodialysis, smoldering multiple myeloma, followed by Dr. Mike Gip at the cancer center, CAD, CVA, COPD , Chronic A fib   [facility]/ [facility]  Nephrology/ MWF  1.  End-stage renal disease - AV fistula in L upper arm  - Episode of chest pain with dialysis on 2/4 as described above - CT 2/4 reports show diffuse atherosclerosis in various areas. - Will hold off on further hemodialysis treatment until cardiac assessment. HD with minimal ultrafiltration d/t low BP. - Continue lasix. Minimal edema. - Tentatively next HD tomorrow after cardiac cath.  2.  Anemia of chronic kidney disease - Hgb 9.3 (2/4) --> 8.6 (2/5)  - Continue procrit for anemia  3.  Secondary hyperparathyroidism - Ca 8.6 (2/4) --> 8.9 (2/5) - Continue sevelamer and calcitrol at home doses  4.  Chest pain - H/o chronic atrial fibrillation, rate controlled on metoprolol tartrate - Troponin negative X3 - 2/4 CT suggests possible duodenum inflammation and notes atherosclerotic changes and 50% stenosis of renal arteries.Multiple renal cysts also reported.  - 2/4 CXR reports Ao calcification.  - 2/4 CT shows L main and 3 vessel CAD. - Awaiting cardiology input / assessment - Continue BB, ASA  5. H/o Multiple myeloma  - Sees oncologist   6. H/o COPD - Continue pulmicort, albuterol   7. H/o dementia - Continue aricept      LOS: [place] 2/5/20193:56 PM  [facility] [place], [place]

## 2017-12-16 NOTE — Progress Notes (Signed)
Subjective:  Patient still has chest pain and unstable anginal symptoms with known coronary disease unable to perform stress test because of back pain is prepared for cardiac cath in the morning  Objective:  Vital Signs in the last 24 hours: Temp:  [97.6 F (36.4 C)-98.4 F (36.9 C)] 97.7 F (36.5 C) (02/05 1533) Pulse Rate:  [58-69] 69 (02/05 1533) Resp:  [16-18] 16 (02/05 1533) BP: (82-118)/(47-72) 118/62 (02/05 1533) SpO2:  [95 %-100 %] 100 % (02/05 1533) Weight:  [189 lb 11.2 oz (86 kg)] 189 lb 11.2 oz (86 kg) (02/05 0339)  Intake/Output from previous day: 02/04 0701 - 02/05 0700 In: 290 [P.O.:240; IV Piggyback:50] Out: 62  Intake/Output from this shift: No intake/output data recorded.  Physical Exam: General appearance: appears stated age Neck: no adenopathy, no carotid bruit, no JVD, supple, symmetrical, trachea midline and thyroid not enlarged, symmetric, no tenderness/mass/nodules Lungs: clear to auscultation bilaterally Heart: regular rate and rhythm, S1, S2 normal, no murmur, click, rub or gallop Abdomen: soft, non-tender; bowel sounds normal; no masses,  no organomegaly Extremities: extremities normal, atraumatic, no cyanosis or edema Pulses: 2+ and symmetric Skin: Skin color, texture, turgor normal. No rashes or lesions Neurologic: Alert and oriented X 3, normal strength and tone. Normal symmetric reflexes. Normal coordination and gait  Lab Results: Recent Labs    12/15/17 0828 12/16/17 0529  WBC 3.4* 3.1*  HGB 9.3* 8.6*  PLT 101* 84*   Recent Labs    12/15/17 0828 12/16/17 0529  NA 141 137  K 3.5 4.5  CL 97* 100*  CO2 29 27  GLUCOSE 118* 83  BUN 31* 34*  CREATININE 4.05* 4.33*   Recent Labs    12/15/17 1300 12/15/17 1708  TROPONINI <0.03 <0.03   Hepatic Function Panel No results for input(s): PROT, ALBUMIN, AST, ALT, ALKPHOS, BILITOT, BILIDIR, IBILI in the last 72 hours. No results for input(s): CHOL in the last 72 hours. No results for  input(s): PROTIME in the last 72 hours.  Imaging: Imaging results have been reviewed  Cardiac Studies:  Assessment/Plan:  Angina Arrhythmia Atrial Fibrillation Chest Pain Coronary Artery Disease Coronary Artery Stent Palpitations Shortness of Breath Abnormal Ekg  . Plan Continue telemetry Recommend cardiac cath in the morning with possible PCI and stent Continue dialysis Anemia Demand ischemia Continue inhalers for COPD Agree with Protonix therapy for reflux symptoms Maintain Pravachol for lipid management Must hold heparin subcu before and after procedure   LOS: 1 day    Frank Huang 12/16/2017, 5:42 PM

## 2017-12-16 NOTE — Progress Notes (Signed)
[facility] NAME: Frank Huang    MR#:  [id number]  DATE OF BIRTH:  08-31-1932  SUBJECTIVE: Admitted for left sternal chest pain 9 out of 10 severity developed during dialysis yesterday admitted to telemetry for evaluation of that.  Patient troponins have been negative x3 and patient dependent on for stress test but they could not do it patient because patient complained of severe back pain and also chest pain and before he went down for the procedure patient received morphine by the registered nurse.  And patient does not seem to remember any of the events and he states that he never had chest pain he always has back pain with dialysis.  Wife at the bedside and says that he has been having memory problem and then he forgets.  According to wife patient did complain of chest pain during dialysis yesterday.  CHIEF COMPLAINT:   Chief Complaint  Patient presents with  . Chest Pain    REVIEW OF SYSTEMS:   ROS CONSTITUTIONAL: No fever, fatigue or weakness.  EYES: No blurred or double vision.  EARS, NOSE, AND THROAT: No tinnitus or ear pain.  RESPIRATORY: No cough, shortness of breath, wheezing or hemoptysis.  CARDIOVASCULAR: No chest pain, orthopnea, edema.  GASTROINTESTINAL: No nausea, vomiting, diarrhea or abdominal pain.  GENITOURINARY: No dysuria, hematuria.  ENDOCRINE: No polyuria, nocturia,  HEMATOLOGY: No anemia, easy bruising or bleeding SKIN: No rash or lesion. MUSCULOSKELETAL: No joint pain or arthritis.   NEUROLOGIC: No tingling, numbness, weakness.  PSYCHIATRY: No anxiety or depression.   DRUG ALLERGIES:  No Known Allergies  VITALS:  Blood pressure 96/63, pulse 65, temperature 98.4 F (36.9 C), temperature source Oral, resp. rate 16, height 6' 3"  (1.905 m), weight 86 kg (189 lb 11.2 oz), SpO2 100 %.  PHYSICAL EXAMINATION:  GENERAL:  84 y.o.-year-old patient lying in the bed with no acute distress.  EYES: Pupils  equal, round, reactive to light and accommodation. No scleral icterus. Extraocular muscles intact.  HEENT: Head atraumatic, normocephalic. Oropharynx and nasopharynx clear.  NECK:  Supple, no jugular venous distention. No thyroid enlargement, no tenderness.  LUNGS: Normal breath sounds bilaterally, no wheezing, rales,rhonchi or crepitation. No use of accessory muscles of respiration.  CARDIOVASCULAR: S1, S2 normal. No murmurs, rubs, or gallops.  ABDOMEN: Soft, nontender, nondistended. Bowel sounds present. No organomegaly or mass.  EXTREMITIES: No pedal edema, cyanosis, or clubbing.  NEUROLOGIC: Cranial nerves II through XII are intact. Muscle strength 5/5 in all extremities. Sensation intact. Gait not checked.  PSYCHIATRIC: The patient is alert and oriented x 3.  SKIN: No obvious rash, lesion, or ulcer.    LABORATORY PANEL:   CBC Recent Labs  Lab 12/16/17 0529  WBC 3.1*  HGB 8.6*  HCT 26.1*  PLT 84*   ------------------------------------------------------------------------------------------------------------------  Chemistries  Recent Labs  Lab 12/16/17 0529  NA 137  K 4.5  CL 100*  CO2 27  GLUCOSE 83  BUN 34*  CREATININE 4.33*  CALCIUM 8.9   ------------------------------------------------------------------------------------------------------------------  Cardiac Enzymes Recent Labs  Lab 12/15/17 1708  TROPONINI <0.03   ------------------------------------------------------------------------------------------------------------------  RADIOLOGY:  Dg Chest Port 1 View  Result Date: 12/15/2017 CLINICAL DATA:  Left-sided chest pain EXAM: PORTABLE CHEST 1 VIEW COMPARISON:  09/26/2017 FINDINGS: Cardiac shadow is mildly enlarged. Aortic calcifications are again seen. Increased density is noted to the lateral aspect of the aortic arch which may be projectional in nature although the possibility of an aortic abnormality cannot be  totally excluded. The lungs are well aerated  bilaterally. Left arm stent is noted. No bony abnormality is seen. IMPRESSION: Prominent aortic knob when compared with prior exams. This may be projectional in nature due to the portable technique and patient rotation although the possibility of acute aortic abnormality cannot be totally excluded. CTA of the chest is recommended for further evaluation. Critical Value/emergent results were called by telephone at the time of interpretation on 12/15/2017 at 9:05 am to Dr. Lenise Arena , who verbally acknowledged these results. Electronically Signed   By: [name] M.D.   On: 12/15/2017 09:10   Ct Angio Chest/abd/pel For Dissection W And/or Wo Contrast  Result Date: 12/15/2017 CLINICAL DATA:  84 year old male with a history of left-sided chest pain during dialysis EXAM: CT ANGIOGRAPHY CHEST, ABDOMEN AND PELVIS TECHNIQUE: Multidetector CT imaging through the chest, abdomen and pelvis was performed using the standard protocol during bolus administration of intravenous contrast. Multiplanar reconstructed images and MIPs were obtained and reviewed to evaluate the vascular anatomy. CONTRAST:  147m ISOVUE-370 IOPAMIDOL (ISOVUE-370) INJECTION 76% COMPARISON:  Multiple prior comparison CT, most recent chest CT 08/20/2016, 05/03/2016, most remote CT 12/13/2013 FINDINGS: CTA CHEST FINDINGS Cardiovascular: Heart: Heart size unchanged with borderline cardiomegaly. No pericardial fluid/thickening. Calcifications of left main, left anterior descending, circumflex, right coronary artery. Aorta: Noncontrast CT demonstrates no evidence of wall hematoma. Course caliber and contour unchanged from the comparison. Moderate to advanced atherosclerotic changes of the aortic arch and descending thoracic aorta, with mixed calcified and soft plaque. Similar configuration of irregular plaque at the aortic arch superolateral margin with mild ectasias. Greatest diameter of the aorta at this site measures 4.1 cm, which is essentially  unchanged to the comparison CT of 08/20/2016. Re-demonstration of focal outpouching at the isthmus of the aorta, projecting inferiorly toward the pulmonary artery configuration is unchanged dating to the most remote comparison CT of 12/13/2013. No associated inflammatory changes. No periaortic fluid. Additional sites of irregular plaque of the descending thoracic aorta without aneurysm, dissection, or periaortic inflammatory change/fluid. Greatest diameter of the thoracic aorta at the aortic hiatus measures 2.9 cm. Pulmonary arteries: No central filling defects of the main pulmonary artery, lobar pulmonary arteries, or proximal segmental pulmonary arteries. Beyond this, timing of the contrast bolus limits the study. Mediastinum/Nodes: No mediastinal adenopathy. Unremarkable appearance of the thoracic esophagus. Unremarkable appearance of the thoracic inlet and thyroid. Lungs/Pleura: Central airways are clear. Trace bilateral pleural effusions no confluent airspace disease. No pneumothorax. Musculoskeletal: Multilevel degenerative changes of the thoracic spine. Accentuated kyphotic curvature at the thoracolumbar junction. Review of the MIP images confirms the above findings. CTA ABDOMEN AND PELVIS FINDINGS VASCULAR Aorta: Moderate atherosclerotic changes of the abdominal aorta with mixed calcified and soft plaque. No circumferential plaque. No dissection of the abdominal aorta. No periaortic fluid. Greatest diameter of the infrarenal abdominal aorta at the level of the inferior mesenteric artery origin measuring 2.4 cm. Celiac: Atherosclerotic changes at the origin the celiac artery with what appears to be high-grade stenosis secondary to mixed calcified and soft plaque. Branch vessels are patent. SMA: Atherosclerotic changes at the origin the superior mesenteric artery, with no significant stenosis. Renals: Atherosclerotic changes at the bilateral renal artery origin. Single renal artery bilaterally. There appears  to be at least 50% stenosis at the origin bilaterally. IMA: Inferior mesenteric artery remains patent, with atherosclerotic changes at the origin. Right lower extremity: Calcified and soft plaque of the common iliac artery. Chronic dissection/irregular plaque through the length of the common iliac  artery without occlusion. Greatest diameter measures 18 mm. Hypogastric artery remains patent. External iliac artery with mild atherosclerotic changes. Common femoral artery with mild atherosclerotic changes. Proximal profunda femoris and SFA patent. Left lower extremity: Calcified and soft plaque of the left common iliac artery. 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 Greatest diameter distally measures 19 mm. Hypogastric artery is patent. External iliac artery with mild atherosclerotic changes. Common femoral artery with mild atherosclerotic changes. Proximal profunda femoris and SFA patent. Veins: Unremarkable appearance of the venous system. Review of the MIP images confirms the above findings. NON-VASCULAR Hepatobiliary: Unremarkable appearance of the liver. Cholecystectomy Pancreas: Unremarkable appearance of the pancreas Spleen: Unremarkable spleen Adrenals/Urinary Tract: Unremarkable appearance of the adrenal glands Right: Multiple rounded lesions of the right kidney, similar to the prior. Intermediate density lesion at the inferior cortex medially measures 2.5 cm, unchanged from the comparison CT of 04/30/2016. Additional low-density lesions at the posterior cortex intermediate density lesion at the lateral cortex of the right kidney measures 2 cm on image 114 of series 6. Size and appearance unchanged. None of these lesions are completely characterized on the single phase CT. No hydronephrosis or nephrolithiasis. Unremarkable course of the right ureter. Cortical thinning. Left: Multiple low-density lesions of the left kidney none of these are completely  characterized on the single phase CT study, with overall relative unchanged size and number of lesions. No hydronephrosis or nephrolithiasis. Unremarkable course of the left ureter. Cortical thinning. Unremarkable appearance of the urinary bladder . Stomach/Bowel: Unremarkable appearance of stomach. Small hiatal hernia. There is ill-defined inflammation/edema within the fat adjacent to the proximal duodenum. No focal fluid collection. No extraluminal air. No abnormally distended small bowel.  No transition point. Moderate stool burden. Extensive diverticular change without evidence of acute inflammatory changes to suggest diverticulitis. No abnormally distended colon. Appendix is not visualized, however, no inflammatory changes are present adjacent to the cecum to indicate an appendicitis. Lymphatic: Multiple lymph nodes in the para-aortic nodal station, none of which are enlarged. Reproductive: Transverse diameter of the prostate measures 5 cm Other: Surgical changes of prior left inguinal hernia repair Musculoskeletal: Surgical changes of left hip arthroplasty. No acute displaced fracture. Degenerative changes of the bilateral sacroiliac joints. Degenerative changes of the lumbar spine. IMPRESSION: No acute vascular finding, with no evidence of acute aortic syndrome. Similar appearance of moderate atherosclerotic changes of the aorta, including irregular focal plaque/ectasia of several locations in the aortic arch and descending thoracic aorta. The focal outpouching of the aortic arch at the aortic isthmus is in a typical location for a ductus diverticulum, and this finding has been unchanged dating to the most remote comparison CT of 12/13/2013. Aortic Atherosclerosis (ICD10-I70.0). Moderate atherosclerotic changes of the abdominal aorta, with associated mesenteric and bilateral renal artery disease contributing to high-grade stenosis of the celiac artery origin, and likely at least 50% stenosis of the bilateral  renal artery origin. 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 Questionable inflammatory changes adjacent to the proximal duodenum, new from the comparison abdominal CT. If there is concern for epigastric/back pain that may correlate to a possible duodenitis or groove pancreatitis, correlation with lab values and upper endoscopy may be useful. Left main and 3 vessel coronary artery disease. Diverticular disease without evidence of acute diverticulitis. Bilateral renal cortical thinning compatible with chronic medical renal disease. Multiple bilateral rounded/cystic renal lesions, none  of which are completely characterized on this single phase CT. If there is reason for more accurate characterization, contrast-enhanced MRI or alternatively renal protocol CT may be considered. Trace bilateral pleural effusions. Signed, Dulcy Fanny. Earleen Newport, DO [facility] Electronically Signed   By: Corrie Mckusick D.O.   On: 12/15/2017 10:43    EKG:   Orders placed or performed during the hospital encounter of 12/15/17  . ED EKG  . ED EKG    ASSESSMENT AND PLAN:   Left-sided chest pain in the patient with coronary artery disease: Troponins have been negative, unable to get a stress test this morning because of chest pain.  But patient does not remember any of even when I saw the patient in the room today he looks comfortable and he is saying that his back hurts and denies any chest pain.  Because of history of CAD, previous cath in last year showed circumflex 100% stenosis, RCA 100% stenosis and left medial lesion 70% stenosis, EF 31%.  Because of her history of CAD, concern for cardiac event , discussed with Dr. Clayborn Bigness who suggested that patient may need cardiac cath and dialysis tomorrow after the  past. Continue aspirin,beta-blockers, as needed morphine.  #2. COPD: No wheezing, continue Pulmicort, albuterol.  History of multiple myeloma, ESRD on hemodialysis Monday, Wednesday, Friday. 3.  Hypotension''chronic hypotension.  #4 history of chronic atrial fibrillation: Rate controlled, metoprolol. 5.  History of fall damage dementia, memory gaps, patient is on Aricept 10 mg p.o. bedtime All the records are reviewed and case discussed with Care Management/Social Workerr. Management plans discussed with the patient, family and they are in agreement.  CODE STATUS:full  TOTAL TIME TAKING CARE OF THIS PATIENT: 35 minutes.   POSSIBLE D/C IN 1-2 DAYS, DEPENDING ON CLINICAL CONDITION.   Epifanio Lesches M.D on 12/16/2017 at 1:54 PM  Between 7am to 6pm - Pager - [phone]  After 6pm go to [email or web address] - password [email or web address]  Office  [phone]  CC: Primary care physician; [name]., MD   Note: This dictation was prepared with Dragon dictation along with smaller phrase technology. Any transcriptional errors that result from this process are unintentional.

## 2017-12-17 ENCOUNTER — Inpatient Hospital Stay
Admission: EM | Disposition: A | Discharge status: Home / Self Care | Payer: Self-pay | Source: Home / Self Care | Attending: Internal Medicine

## 2017-12-17 HISTORY — PX: LEFT HEART CATH AND CORONARY ANGIOGRAPHY: CATH118249

## 2017-12-17 LAB — PHOSPHORUS: Phosphorus: 5.5 mg/dL — ABNORMAL HIGH (ref 2.5–4.6)

## 2017-12-17 LAB — SURGICAL PCR SCREEN
MRSA, PCR: NEGATIVE
STAPHYLOCOCCUS AUREUS: NEGATIVE

## 2017-12-17 SURGERY — LEFT HEART CATH AND CORONARY ANGIOGRAPHY
Anesthesia: Moderate Sedation

## 2017-12-17 MED ORDER — FENTANYL CITRATE (PF) 100 MCG/2ML IJ SOLN
INTRAMUSCULAR | Status: AC
  Filled 2017-12-17: qty 2

## 2017-12-17 MED ORDER — IOPAMIDOL (ISOVUE-300) INJECTION 61%
INTRAVENOUS | Status: DC | PRN
  Administered 2017-12-17: 60 mL via INTRA_ARTERIAL

## 2017-12-17 MED ORDER — EPOETIN ALFA 10000 UNIT/ML IJ SOLN: 4000.0000 [IU] | Freq: Once | INTRAMUSCULAR | Status: DC

## 2017-12-17 MED ORDER — CLOPIDOGREL BISULFATE 75 MG PO TABS: 75.0000 mg | ORAL_TABLET | Freq: Every day | ORAL | 0 refills | Status: DC

## 2017-12-17 MED ORDER — ISOSORBIDE MONONITRATE ER 30 MG PO TB24: 30.0000 mg | ORAL_TABLET | Freq: Every day | ORAL | 0 refills | Status: AC

## 2017-12-17 MED ORDER — ISOSORBIDE MONONITRATE ER 30 MG PO TB24
30.0000 mg | ORAL_TABLET | Freq: Every day | ORAL | Status: DC
  Administered 2017-12-17: 30 mg via ORAL
  Filled 2017-12-17 (×4): qty 1

## 2017-12-17 MED ORDER — TRAMADOL HCL 50 MG PO TABS
ORAL_TABLET | ORAL | Status: AC
  Filled 2017-12-17: qty 1

## 2017-12-17 MED ORDER — METOPROLOL TARTRATE 25 MG PO TABS: 25.0000 mg | ORAL_TABLET | Freq: Two times a day (BID) | ORAL | 0 refills | Status: DC

## 2017-12-17 MED ORDER — MIDAZOLAM HCL 2 MG/2ML IJ SOLN
INTRAMUSCULAR | Status: DC | PRN
  Administered 2017-12-17 (×2): 0.5 mg via INTRAVENOUS

## 2017-12-17 MED ORDER — ACETAMINOPHEN 325 MG PO TABS: 650.0000 mg | ORAL_TABLET | ORAL | Status: DC | PRN

## 2017-12-17 MED ORDER — SODIUM CHLORIDE 0.9 % WEIGHT BASED INFUSION: 1.0000 mL/kg/h | INTRAVENOUS | Status: DC

## 2017-12-17 MED ORDER — SODIUM CHLORIDE 0.9 % IV SOLN: 250.0000 mL | INTRAVENOUS | Status: DC | PRN

## 2017-12-17 MED ORDER — MIDAZOLAM HCL 2 MG/2ML IJ SOLN
INTRAMUSCULAR | Status: AC
  Filled 2017-12-17: qty 2

## 2017-12-17 MED ORDER — FENTANYL CITRATE (PF) 100 MCG/2ML IJ SOLN
INTRAMUSCULAR | Status: DC | PRN
  Administered 2017-12-17 (×2): 25 ug via INTRAVENOUS

## 2017-12-17 MED ORDER — SODIUM CHLORIDE 0.9% FLUSH: 3.0000 mL | Freq: Two times a day (BID) | INTRAVENOUS | Status: DC

## 2017-12-17 MED ORDER — CLOPIDOGREL BISULFATE 75 MG PO TABS
75.0000 mg | ORAL_TABLET | Freq: Every day | ORAL | Status: DC
  Administered 2017-12-17: 75 mg via ORAL
  Filled 2017-12-17 (×4): qty 1

## 2017-12-17 MED ORDER — METOPROLOL TARTRATE 25 MG PO TABS
25.0000 mg | ORAL_TABLET | Freq: Two times a day (BID) | ORAL | Status: DC
  Filled 2017-12-17 (×4): qty 1

## 2017-12-17 MED ORDER — HEPARIN (PORCINE) IN NACL 2-0.9 UNIT/ML-% IJ SOLN
INTRAMUSCULAR | Status: AC
  Filled 2017-12-17: qty 1000

## 2017-12-17 MED ORDER — SODIUM CHLORIDE 0.9% FLUSH: 3.0000 mL | INTRAVENOUS | Status: DC | PRN

## 2017-12-17 MED ORDER — ONDANSETRON HCL 4 MG/2ML IJ SOLN: 4.0000 mg | Freq: Four times a day (QID) | INTRAMUSCULAR | Status: DC | PRN

## 2017-12-17 SURGICAL SUPPLY — 9 items
CATH INFINITI 5FR ANG PIGTAIL (CATHETERS) ×2 IMPLANT
CATH INFINITI 5FR JL4 (CATHETERS) ×2 IMPLANT
CATH INFINITI JR4 5F (CATHETERS) ×2 IMPLANT
DEVICE CLOSURE MYNXGRIP 5F (Vascular Products) ×2 IMPLANT
KIT MANI 3VAL PERCEP (MISCELLANEOUS) ×2 IMPLANT
NEEDLE PERC 18GX7CM (NEEDLE) ×2 IMPLANT
PACK CARDIAC CATH (CUSTOM PROCEDURE TRAY) ×2 IMPLANT
SHEATH AVANTI 5FR X 11CM (SHEATH) ×2 IMPLANT
WIRE GUIDERIGHT .035X150 (WIRE) ×2 IMPLANT

## 2017-12-17 NOTE — Progress Notes (Signed)
Post HD Assessment 

## 2017-12-17 NOTE — Progress Notes (Signed)
Pre HD Assessment  

## 2017-12-17 NOTE — Care Management (Signed)
ESRD with chronic dialysis. Notified [name] with [facility] of admission.  Was unable to tolerate stress test so is scheduled for cardiac cath

## 2017-12-17 NOTE — Progress Notes (Signed)
1400 HD Tx started

## 2017-12-17 NOTE — Progress Notes (Signed)
Discharge instructions explained to pt and pts spouse/ verbalized an understanding/ iv and tele removed/ r groin site WNL/ transported off unit via wheelchair.

## 2017-12-17 NOTE — Progress Notes (Addendum)
Cardiac cath Negative for any abnormality, patient will be discharged after dialysis.  Discussed with cardiologist Dr. call wood recommended increasing the metoprolol, adding Imdur, Plavix .  Discharge the patient home and follow-up with primary cardiologist Dr. Ubaldo Glassing in 1-2 weeks.

## 2017-12-17 NOTE — Progress Notes (Signed)
Pre HD Tx  

## 2017-12-17 NOTE — Progress Notes (Signed)
[place], [place] 12/17/17  Subjective:   Patient seen during dialysis.  Tolerating well Underwent coronary angiography today.  Medical management recommended.  No angioplasty.   HEMODIALYSIS FLOWSHEET:  Blood Flow Rate (mL/min): 400 mL/min Arterial Pressure (mmHg): -130 mmHg Venous Pressure (mmHg): 190 mmHg Transmembrane Pressure (mmHg): 70 mmHg Ultrafiltration Rate (mL/min): 490 mL/min Dialysate Flow Rate (mL/min): 600 ml/min Conductivity: Machine : 14 Conductivity: Machine : 14 Dialysis Fluid Bolus: Normal Saline Bolus Amount (mL): 250 mL    Objective:  Vital signs in last 24 hours:  Temp:  [97.6 F (36.4 C)-98.7 F (37.1 C)] 98.7 F (37.1 C) (02/06 1353) Pulse Rate:  [63-79] 70 (02/06 1400) Resp:  [12-20] 15 (02/06 1600) BP: (101-137)/(49-87) 132/72 (02/06 1600) SpO2:  [79 %-100 %] 96 % (02/06 1353) Weight:  [86.6 kg (190 lb 14.4 oz)-86.6 kg (190 lb 14.7 oz)] 86.6 kg (190 lb 14.7 oz) (02/06 1353)  Weight change:  Filed Weights   12/16/17 0339 12/17/17 1014 12/17/17 1353  Weight: 86 kg (189 lb 11.2 oz) 86.6 kg (190 lb 14.4 oz) 86.6 kg (190 lb 14.7 oz)    Intake/Output:    Intake/Output Summary (Last 24 hours) at 12/17/2017 1725 Last data filed at 12/17/2017 1025 Gross per 24 hour  Intake 480 ml  Output 0 ml  Net 480 ml     Physical Exam: General:  Laying in the bed, no acute distress  HEENT  moist oral mucous membranes  Neck  supple.   Pulm/lungs  normal breathing effort, clear to auscultation b/l  CVS/Heart  irregularly irregular, prominent systolic murmur  Abdomen:   Soft, nontender  Extremities:  Right leg with trace edema  Neurologic:  Alert and oriented.   Skin:  No acute rashes  Access:  Left arm AV fistula, good bruit and thrill       Basic Metabolic Panel:  Recent Labs  Lab 12/15/17 0828 12/16/17 0529 12/17/17 0408  NA 141 137  --   K 3.5 4.5  --   CL 97* 100*  --   CO2 29 27  --   GLUCOSE 118* 83  --    BUN 31* 34*  --   CREATININE 4.05* 4.33*  --   CALCIUM 8.6* 8.9  --   PHOS  --   --  5.5*     CBC: Recent Labs  Lab 12/15/17 0828 12/16/17 0529  WBC 3.4* 3.1*  HGB 9.3* 8.6*  HCT 28.0* 26.1*  MCV 103.9* 102.8*  PLT 101* 84*      Lab Results  Component Value Date   HEPBSAG Negative 05/28/2017   HEPBSAB Reactive 01/29/2017      Microbiology:  Recent Results (from the past 240 hour(s))  Surgical pcr screen     Status: None   Collection Time: 12/17/17 12:29 AM  Result Value Ref Range Status   MRSA, PCR NEGATIVE NEGATIVE Final   Staphylococcus aureus NEGATIVE NEGATIVE Final    Comment: (NOTE) The Xpert SA Assay (FDA approved for NASAL specimens in patients 61 years of age and older), is one component of a comprehensive surveillance program. It is not intended to diagnose infection nor to guide or monitor treatment. Performed at [facility], [address] [place], [place] [address]     Coagulation Studies: No results for input(s): LABPROT, INR in the last 72 hours.  Urinalysis: No results for input(s): COLORURINE, LABSPEC, PHURINE, GLUCOSEU, HGBUR, BILIRUBINUR, KETONESUR, PROTEINUR, UROBILINOGEN, NITRITE, LEUKOCYTESUR in the last 72 hours.  Invalid  input(s): APPERANCEUR    Imaging: No results found.   Medications:   . sodium chloride    . sodium chloride     . aspirin EC  81 mg Oral Daily  . budesonide (PULMICORT) nebulizer solution  0.5 mg Nebulization BID  . calcitRIOL  0.5 mcg Oral Once per day on Mon Wed Fri  . clopidogrel  75 mg Oral Daily  . donepezil  10 mg Oral QHS  . furosemide  80 mg Oral Q T,Th,S,Su  . ipratropium-albuterol  3 mL Nebulization Q6H  . isosorbide mononitrate  30 mg Oral Daily  . metoprolol tartrate  25 mg Oral BID  . multivitamin with minerals  1 tablet Oral Daily  . pantoprazole  40 mg Oral Daily  . pravastatin  40 mg Oral q1800  . sevelamer carbonate  1,600 mg Oral TID WC  . sodium chloride flush  3 mL  Intravenous Q12H  . sucralfate  1 g Oral TID AC & HS  . traMADol       sodium chloride, acetaminophen **OR** acetaminophen, acetaminophen, alum & mag hydroxide-simeth, lidocaine-prilocaine, nitroGLYCERIN, ondansetron (ZOFRAN) IV, sodium chloride flush, traMADol  Assessment/ Plan:  84 y.o. male with ESRD on hemodialysis, smoldering multiple myeloma, followed by Dr. Mike Gip at the cancer center, CAD, CVA, COPD , Chronic A fib   [facility]/ MWF  1.  End-stage renal disease - AV fistula in L upper arm  - Episode of chest pain with dialysis on 2/4 as described above - CT 2/4 reports show diffuse atherosclerosis in various areas. -  Continue lasix. Minimal edema.  2.  Anemia of chronic kidney disease - Hgb 9.3 (2/4) --> 8.6 (2/5)  - Continue procrit for anemia  3.  Secondary hyperparathyroidism - Ca 8.6 (2/4) --> 8.9 (2/5) - Continue sevelamer and calcitrol at home doses  4.  Chest pain - H/o chronic atrial fibrillation, rate controlled on metoprolol tartrate - Troponin negative X3 - 2/4 CT suggests possible duodenum inflammation and notes atherosclerotic changes and 50% stenosis of renal arteries.Multiple renal cysts also reported.  - 2/4 CXR reports Ao calcification.  - 2/4 CT shows L main and 3 vessel CAD. Underwent coronary angiography today.  Medical management recommended.  5. H/o Multiple myeloma  - Sees oncologist   6. H/o COPD - Continue pulmicort, albuterol   7. H/o dementia - Continue aricept      LOS: 2 Rosangela Fehrenbach Candiss Norse 2/6/20195:25 PM  [facility] [place], [place]

## 2017-12-18 ENCOUNTER — Telehealth: Payer: Self-pay

## 2017-12-18 LAB — CARDIAC CATHETERIZATION: Cath EF Quantitative: 35 %

## 2017-12-18 NOTE — Telephone Encounter (Signed)
Transition Care Management Follow-Up Telephone Call   Date discharged and where: [facility] on 12/17/17  How have you been since you were released from the hospital? Wife states pt is doing much better. No current s/s.  Any patient concerns? none  Items Reviewed:   Meds: verified  Allergies: verified  Dietary Changes Reviewed: renal diet  Functional Questionnaire:  Independent-I Dependent-D  ADLs:   Dressing- I    Eating- I   Maintaining continence- I   Transferring- I   Transportation- D   Meal Prep- I, wife cooks or preps everything   Managing Meds- D  Confirmed importance and Date/Time of follow-up visits scheduled: None, wife declines due to pt having a  Follow up with Dr. Ubaldo Glassing on 01/05/18.    Confirmed with patient if condition worsens to call PCP or go to the Emergency Dept. Patient was given office number and encouraged to call back with questions or concerns: YES

## 2017-12-19 ENCOUNTER — Encounter: Payer: Self-pay | Admitting: Internal Medicine

## 2017-12-20 DIAGNOSIS — N2581 Secondary hyperparathyroidism of renal origin: Secondary | ICD-10-CM | POA: Diagnosis not present

## 2017-12-20 DIAGNOSIS — D509 Iron deficiency anemia, unspecified: Secondary | ICD-10-CM | POA: Diagnosis not present

## 2017-12-20 DIAGNOSIS — N186 End stage renal disease: Secondary | ICD-10-CM | POA: Diagnosis not present

## 2017-12-20 DIAGNOSIS — D631 Anemia in chronic kidney disease: Secondary | ICD-10-CM | POA: Diagnosis not present

## 2017-12-20 DIAGNOSIS — D689 Coagulation defect, unspecified: Secondary | ICD-10-CM | POA: Diagnosis not present

## 2017-12-21 NOTE — Discharge Summary (Signed)
Frank Huang, is a 84 y.o. male  DOB 03-Mar-1932  MRN [id number].  Admission date:  12/15/2017  Admitting Physician  Loletha Grayer, MD  Discharge Date:  12/17/2017   Primary MD  [name]., MD  Recommendations for primary care physician for things to follow:  Follow-up with PCP in 1 week Follow-up with cardiologist Dr. Ubaldo Glassing in 2 weeks   Admission Diagnosis  Nonspecific chest pain [R07.9]   Discharge Diagnosis  Nonspecific chest pain [R07.9]    Active Problems:   Chest pain      Past Medical History:  Diagnosis Date  . Cancer ([place])   . Chronic kidney disease   . COPD (chronic obstructive pulmonary disease) ([place])   . Coronary artery disease   . GERD (gastroesophageal reflux disease)   . Hypertension   . Multiple myeloma ([place])   . Myocardial infarction ([place]) 2005  . Neuropathy   . Shortness of breath dyspnea   . Stroke (cerebrum) (HCC)    weakness Lt hand  . Systolic CHF [facility])     Past Surgical History:  Procedure Laterality Date  . APPENDECTOMY    . AV FISTULA PLACEMENT Left   . CARDIAC CATHETERIZATION N/A 11/15/2016   Procedure: Left Heart Cath and Coronary Angiography;  Surgeon: Isaias Cowman, MD;  Location: [place] [facility];  Service: Cardiovascular;  Laterality: N/A;  . CHOLECYSTECTOMY    . LEFT HEART CATH AND CORONARY ANGIOGRAPHY N/A 12/17/2017   Procedure: LEFT HEART CATH AND CORONARY ANGIOGRAPHY and possible PCI and stent;  Surgeon: Yolonda Kida, MD;  Location: [place] [facility];  Service: Cardiovascular;  Laterality: N/A;  . SHOULDER ARTHROSCOPY WITH OPEN ROTATOR CUFF REPAIR Left 09/19/2015   Procedure: SHOULDER ARTHROSCOPY , subacromial decompression, debridement;  Surgeon: Corky Mull, MD;  Location: [facility] ORS;  Service: Orthopedics;  Laterality: Left;  . TOTAL HIP  ARTHROPLASTY Left        History of present illness and  Hospital Course:     Kindly see H&P for history of present illness and admission details, please review complete Labs, Consult reports and Test reports for all details in brief  HPI  from the history and physical done on the day of admission 84 year old male patient with history of ESRD on hemodialysis developed chest pain during dialysis so admitted for evaluation of chest pain.   Hospital Course  #1 chest pain secondary to angina: Patient is admitted to telemetry, troponins have been -3 times but because of history of coronary artery disease, previous stent history seen by cardiology, patient is taken to cardiac cath by Dr. Clayborn Bigness, cardiac catheter revealed multivessel coronary artery disease with 70% left main disease, occluded RCA, previously placed ostial LAD to proximal LAD stent use widely patent.  Moderate LV dysfunction with EF 35%.  Cardiologist recommended aggressive medical management.  Patient started on Plavix, Imdur, patient does have dementia, discussed with patient's husband over the phone after the cardiac cath with the results, discharge the patient home with aspirin, Plavix, Imdur, beta-blockers, statins.  #2 ESRD on hemodialysis, patient seen by nephrologist, received hemodialysis after the cardiac cath.  3.  Anemia of chronic kidney disease; on Procrit for anemia.    Discharge Condition: stable   Follow UP  Follow-up Information    [name]., MD. Schedule an appointment as soon as possible for a visit.   Specialty:  Family Medicine Contact information: [address] [place] [address] [phone]  Teodoro Spray, MD. Schedule an appointment as soon as possible for a visit in 2 week(s).   Specialty:  Cardiology Contact information: [place] [address] [phone]             Discharge  Instructions  and  Discharge Medications      Allergies as of 12/17/2017   No Known Allergies     Medication List    TAKE these medications   aspirin EC 81 MG tablet Take 81 mg by mouth daily.   calcitRIOL 0.5 MCG capsule Commonly known as:  ROCALTROL Take 0.5 mcg by mouth 3 (three) times a week.   clopidogrel 75 MG tablet Commonly known as:  PLAVIX Take 1 tablet (75 mg total) by mouth daily.   donepezil 10 MG tablet Commonly known as:  ARICEPT TAKE 1 TABLET BY MOUTH AT  BEDTIME   furosemide 80 MG tablet Commonly known as:  LASIX Take 1 tablet (80 mg total) by mouth every Tuesday, Thursday, Saturday, and Sunday.   isosorbide mononitrate 30 MG 24 hr tablet Commonly known as:  IMDUR Take 1 tablet (30 mg total) by mouth daily.   lidocaine-prilocaine cream Commonly known as:  EMLA Apply 1 application topically daily as needed.   lovastatin 40 MG tablet Commonly known as:  MEVACOR TAKE 1 TABLET BY MOUTH EVERY DAY FOR HIGH CHOLESTEROL   metoprolol tartrate 25 MG tablet Commonly known as:  LOPRESSOR Take 1 tablet (25 mg total) by mouth 2 (two) times daily. What changed:  how much to take   MULTI-VITAMINS Tabs Take 1 tablet by mouth daily. Reported on 04/01/2016   omeprazole 20 MG capsule Commonly known as:  PRILOSEC TAKE 1 CAPSULE BY MOUTH  DAILY.   RENVELA 800 MG tablet Generic drug:  sevelamer carbonate Take 1,600 mg by mouth 3 (three) times daily.   sucralfate 1 g tablet Commonly known as:  CARAFATE Take 1 tablet (1 g total) by mouth 4 (four) times daily -  before meals and at bedtime.   traMADol 50 MG tablet Commonly known as:  ULTRAM Take 1 tablet (50 mg total) every 12 (twelve) hours as needed by mouth.         Diet and Activity recommendation: See Discharge Instructions above   Consults obtained -    Major procedures and Radiology Reports - PLEASE review detailed and final reports for all details, in brief -     Dg Chest Port 1  View  Result Date: 12/15/2017 CLINICAL DATA:  Left-sided chest pain EXAM: PORTABLE CHEST 1 VIEW COMPARISON:  09/26/2017 FINDINGS: Cardiac shadow is mildly enlarged. Aortic calcifications are again seen. Increased density is noted to the lateral aspect of the aortic arch which may be projectional in nature although the possibility of an aortic abnormality cannot be totally excluded. The lungs are well aerated bilaterally. Left arm stent is noted. No bony abnormality is seen. IMPRESSION: Prominent aortic knob when compared with prior exams. This may be projectional in nature due to the portable technique and patient rotation although the possibility of acute aortic abnormality cannot be totally excluded. CTA of the chest is recommended for further evaluation. Critical Value/emergent results were called by telephone at the time of interpretation on 12/15/2017 at 9:05 am to Dr. Lenise Arena , who verbally acknowledged these results. Electronically Signed   By: [name] M.D.   On: 12/15/2017 09:10   Ct Angio Chest/abd/pel For Dissection W And/or Wo Contrast  Result Date: 12/15/2017 CLINICAL DATA:  84 year old male with a history of left-sided chest pain during dialysis EXAM: CT ANGIOGRAPHY CHEST, ABDOMEN AND PELVIS TECHNIQUE: Multidetector CT imaging through the chest, abdomen and pelvis was performed using the standard protocol during bolus administration of intravenous contrast. Multiplanar reconstructed images and MIPs were obtained and reviewed to evaluate the vascular anatomy. CONTRAST:  159m ISOVUE-370 IOPAMIDOL (ISOVUE-370) INJECTION 76% COMPARISON:  Multiple prior comparison CT, most recent chest CT 08/20/2016, 05/03/2016, most remote CT 12/13/2013 FINDINGS: CTA CHEST FINDINGS Cardiovascular: Heart: Heart size unchanged with borderline cardiomegaly. No pericardial fluid/thickening. Calcifications of left main, left anterior descending, circumflex, right coronary artery. Aorta: Noncontrast CT  demonstrates no evidence of wall hematoma. Course caliber and contour unchanged from the comparison. Moderate to advanced atherosclerotic changes of the aortic arch and descending thoracic aorta, with mixed calcified and soft plaque. Similar configuration of irregular plaque at the aortic arch superolateral margin with mild ectasias. Greatest diameter of the aorta at this site measures 4.1 cm, which is essentially unchanged to the comparison CT of 08/20/2016. Re-demonstration of focal outpouching at the isthmus of the aorta, projecting inferiorly toward the pulmonary artery configuration is unchanged dating to the most remote comparison CT of 12/13/2013. No associated inflammatory changes. No periaortic fluid. Additional sites of irregular plaque of the descending thoracic aorta without aneurysm, dissection, or periaortic inflammatory change/fluid. Greatest diameter of the thoracic aorta at the aortic hiatus measures 2.9 cm. Pulmonary arteries: No central filling defects of the main pulmonary artery, lobar pulmonary arteries, or proximal segmental pulmonary arteries. Beyond this, timing of the contrast bolus limits the study. Mediastinum/Nodes: No mediastinal adenopathy. Unremarkable appearance of the thoracic esophagus. Unremarkable appearance of the thoracic inlet and thyroid. Lungs/Pleura: Central airways are clear. Trace bilateral pleural effusions no confluent airspace disease. No pneumothorax. Musculoskeletal: Multilevel degenerative changes of the thoracic spine. Accentuated kyphotic curvature at the thoracolumbar junction. Review of the MIP images confirms the above findings. CTA ABDOMEN AND PELVIS FINDINGS VASCULAR Aorta: Moderate atherosclerotic changes of the abdominal aorta with mixed calcified and soft plaque. No circumferential plaque. No dissection of the abdominal aorta. No periaortic fluid. Greatest diameter of the infrarenal abdominal aorta at the level of the inferior mesenteric artery origin  measuring 2.4 cm. Celiac: Atherosclerotic changes at the origin the celiac artery with what appears to be high-grade stenosis secondary to mixed calcified and soft plaque. Branch vessels are patent. SMA: Atherosclerotic changes at the origin the superior mesenteric artery, with no significant stenosis. Renals: Atherosclerotic changes at the bilateral renal artery origin. Single renal artery bilaterally. There appears to be at least 50% stenosis at the origin bilaterally. IMA: Inferior mesenteric artery remains patent, with atherosclerotic changes at the origin. Right lower extremity: Calcified and soft plaque of the common iliac artery. 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 Greatest diameter measures 18 mm. Hypogastric artery remains patent. External iliac artery with mild atherosclerotic changes. Common femoral artery with mild atherosclerotic changes. Proximal profunda femoris and SFA patent. Left lower extremity: Calcified and soft plaque of the left common iliac artery. 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 Greatest diameter distally measures 19 mm. Hypogastric artery is patent. External iliac artery with mild atherosclerotic changes. Common femoral artery with mild atherosclerotic changes. Proximal profunda femoris and SFA patent. Veins: Unremarkable appearance of the venous system. Review of the MIP images confirms the above findings. NON-VASCULAR Hepatobiliary: Unremarkable appearance of the liver. Cholecystectomy Pancreas: Unremarkable appearance of the  pancreas Spleen: Unremarkable spleen Adrenals/Urinary Tract: Unremarkable appearance of the adrenal glands Right: Multiple rounded lesions of the right kidney, similar to the prior. Intermediate density lesion at the inferior cortex medially measures 2.5 cm, unchanged from the comparison CT of 04/30/2016. Additional low-density lesions at the  posterior cortex intermediate density lesion at the lateral cortex of the right kidney measures 2 cm on image 114 of series 6. Size and appearance unchanged. Frank Huang of these lesions are completely characterized on the single phase CT. No hydronephrosis or nephrolithiasis. Unremarkable course of the right ureter. Cortical thinning. Left: Multiple low-density lesions of the left kidney Frank Huang of these are completely characterized on the single phase CT study, with overall relative unchanged size and number of lesions. No hydronephrosis or nephrolithiasis. Unremarkable course of the left ureter. Cortical thinning. Unremarkable appearance of the urinary bladder . Stomach/Bowel: Unremarkable appearance of stomach. Small hiatal hernia. There is ill-defined inflammation/edema within the fat adjacent to the proximal duodenum. No focal fluid collection. No extraluminal air. No abnormally distended small bowel.  No transition point. Moderate stool burden. Extensive diverticular change without evidence of acute inflammatory changes to suggest diverticulitis. No abnormally distended colon. Appendix is not visualized, however, no inflammatory changes are present adjacent to the cecum to indicate an appendicitis. Lymphatic: Multiple lymph nodes in the para-aortic nodal station, Frank Huang of which are enlarged. Reproductive: Transverse diameter of the prostate measures 5 cm Other: Surgical changes of prior left inguinal hernia repair Musculoskeletal: Surgical changes of left hip arthroplasty. No acute displaced fracture. Degenerative changes of the bilateral sacroiliac joints. Degenerative changes of the lumbar spine. IMPRESSION: No acute vascular finding, with no evidence of acute aortic syndrome. Similar appearance of moderate atherosclerotic changes of the aorta, including irregular focal plaque/ectasia of several locations in the aortic arch and descending thoracic aorta. The focal outpouching of the aortic arch at the aortic isthmus  is in a typical location for a ductus diverticulum, and this finding has been unchanged dating to the most remote comparison CT of 12/13/2013. Aortic Atherosclerosis (ICD10-I70.0). 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 Questionable inflammatory changes adjacent to the proximal duodenum, new from the comparison abdominal CT. If there is concern for epigastric/back pain that may correlate to a possible duodenitis or groove pancreatitis, correlation with lab values and upper endoscopy may be useful. Left main and 3 vessel coronary artery disease. Diverticular disease without evidence of acute diverticulitis. Bilateral renal cortical thinning compatible with chronic medical renal disease. Multiple bilateral rounded/cystic renal lesions, Frank Huang of which are completely characterized on this single phase CT. If there is reason for more accurate characterization, contrast-enhanced MRI or alternatively renal protocol CT may be considered. Trace bilateral pleural effusions. Signed, Dulcy Fanny. Earleen Newport, DO [facility] Radiology Electronically Signed   By: Corrie Mckusick D.O.   On: 12/15/2017 10:43    Micro Results    Recent Results (from the past 240 hour(s))  Surgical pcr screen     Status: Frank Huang   Collection Time: 12/17/17 12:29 AM  Result Value Ref Range Status   MRSA, PCR NEGATIVE NEGATIVE Final   Staphylococcus aureus NEGATIVE NEGATIVE Final    Comment: (NOTE)  The Xpert SA Assay (FDA approved for NASAL specimens in patients 37 years of age and  older), is one component of a comprehensive surveillance program. It is not intended to diagnose infection nor to guide or monitor treatment. Performed at [facility], [address] [place], [place] [address]        Today   Subjective:   Mikell Kazlauskas today has no headache,no chest abdominal pain,no new weakness tingling or numbness, feels much better wants to go home today.   Objective:   Blood pressure 117/63, pulse (!) 102, temperature 97.8 F (36.6 C), resp. rate 18, height 6' 3"  (1.905 m), weight 85.8 kg (189 lb 2.5 oz), SpO2 99 %.  No intake or output data in the 24 hours ending 12/21/17 0731  Exam Awake Alert, Oriented x 3, No new F.N deficits, Normal affect [place].AT,PERRAL Supple Neck,No JVD, No cervical lymphadenopathy appriciated.  Symmetrical Chest wall movement, Good air movement bilaterally, CTAB RRR,No Gallops,Rubs or new Murmurs, No Parasternal Heave +ve B.Sounds, Abd Soft, Non tender, No organomegaly appriciated, No rebound -guarding or rigidity. No Cyanosis, Clubbing or edema, No new Rash or bruise  Data Review   CBC w Diff:  Lab Results  Component Value Date   WBC 3.1 (L) 12/16/2017   HGB 8.6 (L) 12/16/2017   HGB 10.1 (L) 04/11/2016   HCT 26.1 (L) 12/16/2017   HCT 31.5 (L) 04/11/2016   PLT 84 (L) 12/16/2017   PLT 150 04/11/2016   LYMPHOPCT 23 12/04/2017   LYMPHOPCT 40.1 02/28/2015   MONOPCT 6 12/04/2017   MONOPCT 5.8 02/28/2015   EOSPCT 5 12/04/2017   EOSPCT 5.5 02/28/2015   BASOPCT 1 12/04/2017   BASOPCT 0.9 02/28/2015    CMP:  Lab Results  Component Value Date   NA 137 12/16/2017   NA 139 06/11/2017   NA 140 09/29/2014   K 4.5 12/16/2017   K 3.8 09/29/2014   CL 100 (L) 12/16/2017   CL 98 09/29/2014   CO2 27 12/16/2017   CO2 35 (H) 09/29/2014   BUN 34 (H) 12/16/2017   BUN 39 (A) 06/11/2017   BUN 23 (H) 09/29/2014   CREATININE 4.33 (H) 12/16/2017   CREATININE 4.49 (H) 09/29/2014   PROT 6.9 12/01/2017   PROT 7.4 09/29/2014    ALBUMIN 3.4 (L) 12/01/2017   ALBUMIN 3.9 04/11/2016   ALBUMIN 3.3 (L) 09/29/2014   BILITOT 0.9 12/01/2017   BILITOT 0.6 09/29/2014   ALKPHOS 73 12/01/2017   ALKPHOS 91 09/29/2014   AST 16 12/01/2017   AST 16 09/29/2014   ALT 11 (L) 12/01/2017   ALT 14 09/29/2014  .   Total Time in preparing paper work, data evaluation and todays exam - 35 minutes  Epifanio Lesches M.D on 12/21/2017 at 7:31 AM    Note: This dictation was prepared with Dragon dictation along with smaller phrase technology. Any transcriptional errors that result from this process are unintentional.  HAMILTON MARINELLO, is a 84 y.o. male  DOB 1931-12-08  MRN [id number].  Admission date:  12/15/2017  Admitting Physician  Loletha Grayer, MD  Discharge Date:  12/21/2017   Primary MD  [name]., MD  Recommendations for primary care physician for things to follow:   Follow with PCP in one week] Follow DR.Fath in 2 weeks  Admission Diagnosis  Nonspecific chest pain [R07.9]   Discharge Diagnosis  Nonspecific chest pain [R07.9]   Active Problems:   Chest pain      Past Medical History:  Diagnosis Date  . Cancer ([place])   . Chronic kidney disease   . COPD (chronic obstructive pulmonary disease) ([place])   . Coronary artery disease   . GERD (gastroesophageal reflux disease)   . Hypertension   . Multiple myeloma ([place])   . Myocardial infarction ([place]) 2005  . Neuropathy   . Shortness of breath dyspnea   . Stroke (cerebrum) (HCC)    weakness Lt hand  . Systolic CHF [facility])     Past Surgical History:  Procedure Laterality Date  . APPENDECTOMY    . AV FISTULA PLACEMENT Left   . CARDIAC CATHETERIZATION N/A 11/15/2016   Procedure: Left Heart Cath and Coronary Angiography;  Surgeon: Isaias Cowman, MD;  Location: [place] [facility];   Service: Cardiovascular;  Laterality: N/A;  . CHOLECYSTECTOMY    . LEFT HEART CATH AND CORONARY ANGIOGRAPHY N/A 12/17/2017   Procedure: LEFT HEART CATH AND CORONARY ANGIOGRAPHY and possible PCI and stent;  Surgeon: Yolonda Kida, MD;  Location: [place] [facility];  Service: Cardiovascular;  Laterality: N/A;  . SHOULDER ARTHROSCOPY WITH OPEN ROTATOR CUFF REPAIR Left 09/19/2015   Procedure: SHOULDER ARTHROSCOPY , subacromial decompression, debridement;  Surgeon: Corky Mull, MD;  Location: [facility] ORS;  Service: Orthopedics;  Laterality: Left;  . TOTAL HIP ARTHROPLASTY Left        History of present illness and  Hospital Course:     Kindly see H&P for history of present illness and admission details, please review complete Labs, Consult reports and Test reports for all details in brief  HPI  from the history and physical done on the day of admission 84 year old male patient with history of ESRD on hemodialysis developed chest pain during dialysis so admitted for evaluation of chest pain.     Hospital Course  1 chest pain secondary to angina: Patient is admitted to telemetry, troponins have been -3 times but because of history of coronary artery disease, previous stent history seen by cardiology, patient is taken to cardiac cath by Dr. Clayborn Bigness, cardiac catheter revealed multivessel coronary artery disease with 70% left main disease, occluded RCA, previously placed ostial LAD to proximal LAD stent use widely patent.  Moderate LV dysfunction with EF 35%.  Cardiologist recommended aggressive medical management.  Patient started on Plavix, Imdur, patient does have dementia, discussed with patient's husband over the phone after the cardiac cath with the results, discharge the patient home with aspirin, Plavix, Imdur, beta-blockers, statins.  #2 ESRD on hemodialysis, patient seen by nephrologist, received hemodialysis after the cardiac cath.  3.  Anemia of chronic kidney disease; on Procrit  for anemia.    Discharge Condition: stable   Follow UP   Follow with pcp in one week Follow up with Dr.Fath In one  week   Discharge Condition:    Follow UP  Follow-up Information    [name]., MD. Schedule an appointment as soon as possible for  a visit.   Specialty:  Family Medicine Contact information: [address] [place] [address] [phone]        Teodoro Spray, MD. Schedule an appointment as soon as possible for a visit in 2 week(s).   Specialty:  Cardiology Contact information: [place] [address] [phone]             Discharge Instructions  and  Discharge Medications      Allergies as of 12/17/2017   No Known Allergies     Medication List    TAKE these medications   aspirin EC 81 MG tablet Take 81 mg by mouth daily.   calcitRIOL 0.5 MCG capsule Commonly known as:  ROCALTROL Take 0.5 mcg by mouth 3 (three) times a week.   clopidogrel 75 MG tablet Commonly known as:  PLAVIX Take 1 tablet (75 mg total) by mouth daily.   donepezil 10 MG tablet Commonly known as:  ARICEPT TAKE 1 TABLET BY MOUTH AT  BEDTIME   furosemide 80 MG tablet Commonly known as:  LASIX Take 1 tablet (80 mg total) by mouth every Tuesday, Thursday, Saturday, and Sunday.   isosorbide mononitrate 30 MG 24 hr tablet Commonly known as:  IMDUR Take 1 tablet (30 mg total) by mouth daily.   lidocaine-prilocaine cream Commonly known as:  EMLA Apply 1 application topically daily as needed.   lovastatin 40 MG tablet Commonly known as:  MEVACOR TAKE 1 TABLET BY MOUTH EVERY DAY FOR HIGH CHOLESTEROL   metoprolol tartrate 25 MG tablet Commonly known as:  LOPRESSOR Take 1 tablet (25 mg total) by mouth 2 (two) times daily. What changed:  how much to take   MULTI-VITAMINS Tabs Take 1 tablet by mouth daily. Reported on 04/01/2016   omeprazole 20 MG capsule Commonly  known as:  PRILOSEC TAKE 1 CAPSULE BY MOUTH  DAILY.   RENVELA 800 MG tablet Generic drug:  sevelamer carbonate Take 1,600 mg by mouth 3 (three) times daily.   sucralfate 1 g tablet Commonly known as:  CARAFATE Take 1 tablet (1 g total) by mouth 4 (four) times daily -  before meals and at bedtime.   traMADol 50 MG tablet Commonly known as:  ULTRAM Take 1 tablet (50 mg total) every 12 (twelve) hours as needed by mouth.         Diet and Activity recommendation: See Discharge Instructions above   Consults obtained; cardiology   Major procedures and Radiology Reports - PLEASE review detailed and final reports for all details, in brief -     Dg Chest Port 1 View  Result Date: 12/15/2017 CLINICAL DATA:  Left-sided chest pain EXAM: PORTABLE CHEST 1 VIEW COMPARISON:  09/26/2017 FINDINGS: Cardiac shadow is mildly enlarged. Aortic calcifications are again seen. Increased density is noted to the lateral aspect of the aortic arch which may be projectional in nature although the possibility of an aortic abnormality cannot be totally excluded. The lungs are well aerated bilaterally. Left arm stent is noted. No bony abnormality is seen. IMPRESSION: Prominent aortic knob when compared with prior exams. This may be projectional in nature due to the portable technique and patient rotation although the possibility of acute aortic abnormality cannot be totally excluded. CTA of the chest is recommended for further evaluation. Critical Value/emergent results were called by telephone at the time of interpretation on 12/15/2017 at 9:05 am to Dr. Lenise Arena , who verbally acknowledged these results. Electronically Signed  By: [name] M.D.   On: 12/15/2017 09:10   Ct Angio Chest/abd/pel For Dissection W And/or Wo Contrast  Result Date: 12/15/2017 CLINICAL DATA:  84 year old male with a history of left-sided chest pain during dialysis EXAM: CT ANGIOGRAPHY CHEST, ABDOMEN AND PELVIS TECHNIQUE:  Multidetector CT imaging through the chest, abdomen and pelvis was performed using the standard protocol during bolus administration of intravenous contrast. Multiplanar reconstructed images and MIPs were obtained and reviewed to evaluate the vascular anatomy. CONTRAST:  177m ISOVUE-370 IOPAMIDOL (ISOVUE-370) INJECTION 76% COMPARISON:  Multiple prior comparison CT, most recent chest CT 08/20/2016, 05/03/2016, most remote CT 12/13/2013 FINDINGS: CTA CHEST FINDINGS Cardiovascular: Heart: Heart size unchanged with borderline cardiomegaly. No pericardial fluid/thickening. Calcifications of left main, left anterior descending, circumflex, right coronary artery. Aorta: Noncontrast CT demonstrates no evidence of wall hematoma. Course caliber and contour unchanged from the comparison. Moderate to advanced atherosclerotic changes of the aortic arch and descending thoracic aorta, with mixed calcified and soft plaque. Similar configuration of irregular plaque at the aortic arch superolateral margin with mild ectasias. Greatest diameter of the aorta at this site measures 4.1 cm, which is essentially unchanged to the comparison CT of 08/20/2016. Re-demonstration of focal outpouching at the isthmus of the aorta, projecting inferiorly toward the pulmonary artery configuration is unchanged dating to the most remote comparison CT of 12/13/2013. No associated inflammatory changes. No periaortic fluid. Additional sites of irregular plaque of the descending thoracic aorta without aneurysm, dissection, or periaortic inflammatory change/fluid. Greatest diameter of the thoracic aorta at the aortic hiatus measures 2.9 cm. Pulmonary arteries: No central filling defects of the main pulmonary artery, lobar pulmonary arteries, or proximal segmental pulmonary arteries. Beyond this, timing of the contrast bolus limits the study. Mediastinum/Nodes: No mediastinal adenopathy. Unremarkable appearance of the thoracic esophagus. Unremarkable  appearance of the thoracic inlet and thyroid. Lungs/Pleura: Central airways are clear. Trace bilateral pleural effusions no confluent airspace disease. No pneumothorax. Musculoskeletal: Multilevel degenerative changes of the thoracic spine. Accentuated kyphotic curvature at the thoracolumbar junction. Review of the MIP images confirms the above findings. CTA ABDOMEN AND PELVIS FINDINGS VASCULAR Aorta: Moderate atherosclerotic changes of the abdominal aorta with mixed calcified and soft plaque. No circumferential plaque. No dissection of the abdominal aorta. No periaortic fluid. Greatest diameter of the infrarenal abdominal aorta at the level of the inferior mesenteric artery origin measuring 2.4 cm. Celiac: Atherosclerotic changes at the origin the celiac artery with what appears to be high-grade stenosis secondary to mixed calcified and soft plaque. Branch vessels are patent. SMA: Atherosclerotic changes at the origin the superior mesenteric artery, with no significant stenosis. Renals: Atherosclerotic changes at the bilateral renal artery origin. Single renal artery bilaterally. There appears to be at least 50% stenosis at the origin bilaterally. IMA: Inferior mesenteric artery remains patent, with atherosclerotic changes at the origin. Right lower extremity: Calcified and soft plaque of the common iliac artery. 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 Greatest diameter measures 18 mm. Hypogastric artery remains patent. External iliac artery with mild atherosclerotic changes. Common femoral artery with mild atherosclerotic changes. Proximal profunda femoris and SFA patent. Left lower extremity: Calcified and soft plaque of the left common iliac artery. 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 Greatest diameter distally measures 19 mm. Hypogastric artery is patent. External iliac artery with mild  atherosclerotic changes. Common femoral artery with mild atherosclerotic changes. Proximal profunda femoris and SFA patent. Veins: Unremarkable appearance of the venous  system. Review of the MIP images confirms the above findings. NON-VASCULAR Hepatobiliary: Unremarkable appearance of the liver. Cholecystectomy Pancreas: Unremarkable appearance of the pancreas Spleen: Unremarkable spleen Adrenals/Urinary Tract: Unremarkable appearance of the adrenal glands Right: Multiple rounded lesions of the right kidney, similar to the prior. Intermediate density lesion at the inferior cortex medially measures 2.5 cm, unchanged from the comparison CT of 04/30/2016. Additional low-density lesions at the posterior cortex intermediate density lesion at the lateral cortex of the right kidney measures 2 cm on image 114 of series 6. Size and appearance unchanged. Frank Huang of these lesions are completely characterized on the single phase CT. No hydronephrosis or nephrolithiasis. Unremarkable course of the right ureter. Cortical thinning. Left: Multiple low-density lesions of the left kidney Frank Huang of these are completely characterized on the single phase CT study, with overall relative unchanged size and number of lesions. No hydronephrosis or nephrolithiasis. Unremarkable course of the left ureter. Cortical thinning. Unremarkable appearance of the urinary bladder . Stomach/Bowel: Unremarkable appearance of stomach. Small hiatal hernia. There is ill-defined inflammation/edema within the fat adjacent to the proximal duodenum. No focal fluid collection. No extraluminal air. No abnormally distended small bowel.  No transition point. Moderate stool burden. Extensive diverticular change without evidence of acute inflammatory changes to suggest diverticulitis. No abnormally distended colon. Appendix is not visualized, however, no inflammatory changes are present adjacent to the cecum to indicate an appendicitis. Lymphatic: Multiple lymph nodes in  the para-aortic nodal station, Frank Huang of which are enlarged. Reproductive: Transverse diameter of the prostate measures 5 cm Other: Surgical changes of prior left inguinal hernia repair Musculoskeletal: Surgical changes of left hip arthroplasty. No acute displaced fracture. Degenerative changes of the bilateral sacroiliac joints. Degenerative changes of the lumbar spine. IMPRESSION: No acute vascular finding, with no evidence of acute aortic syndrome. Similar appearance of moderate atherosclerotic changes of the aorta, including irregular focal plaque/ectasia of several locations in the aortic arch and descending thoracic aorta. The focal outpouching of the aortic arch at the aortic isthmus is in a typical location for a ductus diverticulum, and this finding has been unchanged dating to the most remote comparison CT of 12/13/2013. Aortic Atherosclerosis (ICD10-I70.0). 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 Questionable inflammatory changes adjacent to the proximal duodenum, new from the comparison abdominal CT. If there is concern for epigastric/back pain that may correlate to a possible duodenitis or groove pancreatitis, correlation with lab values and upper endoscopy may be useful. Left main and 3 vessel coronary artery disease. Diverticular disease without evidence of acute diverticulitis. Bilateral renal cortical thinning compatible with chronic medical renal disease. Multiple bilateral rounded/cystic renal lesions, Frank Huang of which are completely characterized on this single phase CT. If there is  reason for more accurate characterization, contrast-enhanced MRI or alternatively renal protocol CT may be considered. Trace bilateral pleural effusions. Signed, Dulcy Fanny. Earleen Newport, DO [facility] Electronically Signed   By: Corrie Mckusick D.O.   On: 12/15/2017 10:43    Micro Results    Recent Results (from the past 240 hour(s))  Surgical pcr screen     Status: Frank Huang   Collection Time: 12/17/17 12:29 AM  Result  Value Ref Range Status   MRSA, PCR NEGATIVE NEGATIVE Final   Staphylococcus aureus NEGATIVE NEGATIVE Final    Comment: (NOTE) The Xpert SA Assay (FDA approved for NASAL specimens in patients 92 years of age and older), is one component of a comprehensive surveillance program. It is not intended to diagnose infection nor to guide or monitor treatment. Performed at [facility], [address] [place], [place] [address]        Today   Subjective:   Uzoma Vivona today has no headache,no chest abdominal pain,no new weakness tingling or numbness, feels much better wants to go home today.   Objective:   Blood pressure 117/63, pulse (!) 102, temperature 97.8 F (36.6 C), resp. rate 18, height 6' 3"  (1.905 m), weight 85.8 kg (189 lb 2.5 oz), SpO2 99 %.  No intake or output data in the 24 hours ending 12/21/17 0731  Exam Awake Alert, Oriented x 3, No new F.N deficits, Normal affect [place].AT,PERRAL Supple Neck,No JVD, No cervical lymphadenopathy appriciated.  Symmetrical Chest wall movement, Good air movement bilaterally, CTAB RRR,No Gallops,Rubs or new Murmurs, No Parasternal Heave +ve B.Sounds, Abd Soft, Non tender, No organomegaly appriciated, No rebound -guarding or rigidity. No Cyanosis, Clubbing or edema, No new Rash or bruise  Data Review   CBC w Diff:  Lab Results  Component Value Date   WBC 3.1 (L) 12/16/2017   HGB 8.6 (L) 12/16/2017   HGB 10.1 (L) 04/11/2016   HCT 26.1 (L) 12/16/2017   HCT 31.5  (L) 04/11/2016   PLT 84 (L) 12/16/2017   PLT 150 04/11/2016   LYMPHOPCT 23 12/04/2017   LYMPHOPCT 40.1 02/28/2015   MONOPCT 6 12/04/2017   MONOPCT 5.8 02/28/2015   EOSPCT 5 12/04/2017   EOSPCT 5.5 02/28/2015   BASOPCT 1 12/04/2017   BASOPCT 0.9 02/28/2015    CMP:  Lab Results  Component Value Date   NA 137 12/16/2017   NA 139 06/11/2017   NA 140 09/29/2014   K 4.5 12/16/2017   K 3.8 09/29/2014   CL 100 (L) 12/16/2017   CL 98 09/29/2014   CO2 27 12/16/2017   CO2 35 (H) 09/29/2014   BUN 34 (H) 12/16/2017   BUN 39 (A) 06/11/2017   BUN 23 (H) 09/29/2014   CREATININE 4.33 (H) 12/16/2017   CREATININE 4.49 (H) 09/29/2014   PROT 6.9 12/01/2017   PROT 7.4 09/29/2014   ALBUMIN 3.4 (L) 12/01/2017   ALBUMIN 3.9 04/11/2016   ALBUMIN 3.3 (L) 09/29/2014   BILITOT 0.9 12/01/2017   BILITOT 0.6 09/29/2014   ALKPHOS 73 12/01/2017   ALKPHOS 91 09/29/2014   AST 16 12/01/2017   AST 16 09/29/2014   ALT 11 (L) 12/01/2017   ALT 14 09/29/2014  .   Total Time in preparing paper work, data evaluation and todays exam - 35 minutes  Epifanio Lesches M.D on 12/17/2017 at 7:31 AM    Note: This dictation was prepared with Dragon dictation along with smaller phrase technology. Any transcriptional errors that result from this process are unintentional.

## 2017-12-22 ENCOUNTER — Encounter: Payer: Self-pay | Admitting: Family Medicine

## 2017-12-22 ENCOUNTER — Ambulatory Visit (INDEPENDENT_AMBULATORY_CARE_PROVIDER_SITE_OTHER): Payer: Medicare Other | Admitting: Family Medicine

## 2017-12-22 VITALS — BP 118/72 | HR 82 | Temp 97.6°F | Resp 16 | Wt 198.0 lb

## 2017-12-22 DIAGNOSIS — C9000 Multiple myeloma not having achieved remission: Secondary | ICD-10-CM

## 2017-12-22 DIAGNOSIS — R233 Spontaneous ecchymoses: Secondary | ICD-10-CM | POA: Diagnosis not present

## 2017-12-22 DIAGNOSIS — I2510 Atherosclerotic heart disease of native coronary artery without angina pectoris: Secondary | ICD-10-CM | POA: Diagnosis not present

## 2017-12-22 DIAGNOSIS — I10 Essential (primary) hypertension: Secondary | ICD-10-CM

## 2017-12-22 DIAGNOSIS — Z992 Dependence on renal dialysis: Secondary | ICD-10-CM

## 2017-12-22 DIAGNOSIS — D696 Thrombocytopenia, unspecified: Secondary | ICD-10-CM | POA: Diagnosis not present

## 2017-12-22 DIAGNOSIS — N186 End stage renal disease: Secondary | ICD-10-CM

## 2017-12-22 NOTE — Progress Notes (Signed)
Patient: Frank Huang Male    DOB: [birth year]   84 y.o.   MRN: [id number] Visit Date: 12/22/2017  Today's Provider: Wilhemena Durie, MD   Chief Complaint  Patient presents with  . Bleeding/Bruising   Subjective:    HPI Pt is here for bruising on his left upper arm where his dialysis port is. Reports that he noticed it after his dialysis treatment on Saturday. He denies any falls to trauma to the area. He says the area is sore and his shoulder was bothering him. He did not have dialysis today due to this. He was in the hospital last week for chest pains. He was sent over from dialysis. He reports that he is feel better from that.      No Known Allergies   Current Outpatient Medications:  .  aspirin EC 81 MG tablet, Take 81 mg by mouth daily., Disp: , Rfl:  .  calcitRIOL (ROCALTROL) 0.5 MCG capsule, Take 0.5 mcg by mouth 3 (three) times a week., Disp: , Rfl:  .  clopidogrel (PLAVIX) 75 MG tablet, Take 1 tablet (75 mg total) by mouth daily., Disp: 30 tablet, Rfl: 0 .  donepezil (ARICEPT) 10 MG tablet, TAKE 1 TABLET BY MOUTH AT  BEDTIME, Disp: 90 tablet, Rfl: 3 .  furosemide (LASIX) 80 MG tablet, Take 1 tablet (80 mg total) by mouth every Tuesday, Thursday, Saturday, and Sunday., Disp: 30 tablet, Rfl: 0 .  isosorbide mononitrate (IMDUR) 30 MG 24 hr tablet, Take 1 tablet (30 mg total) by mouth daily., Disp: 30 tablet, Rfl: 0 .  lidocaine-prilocaine (EMLA) cream, Apply 1 application topically daily as needed. , Disp: , Rfl:  .  lovastatin (MEVACOR) 40 MG tablet, TAKE 1 TABLET BY MOUTH EVERY DAY FOR HIGH CHOLESTEROL, Disp: , Rfl:  .  metoprolol tartrate (LOPRESSOR) 25 MG tablet, Take 1 tablet (25 mg total) by mouth 2 (two) times daily., Disp: 60 tablet, Rfl: 0 .  Multiple Vitamin (MULTI-VITAMINS) TABS, Take 1 tablet by mouth daily. Reported on 04/01/2016, Disp: , Rfl:  .  omeprazole (PRILOSEC) 20 MG capsule, TAKE 1 CAPSULE BY MOUTH  DAILY., Disp: 90 capsule, Rfl: 3 .  RENVELA 800 MG  tablet, Take 1,600 mg by mouth 3 (three) times daily. , Disp: , Rfl:  .  sucralfate (CARAFATE) 1 g tablet, Take 1 tablet (1 g total) by mouth 4 (four) times daily -  before meals and at bedtime., Disp: 120 tablet, Rfl: 11 .  traMADol (ULTRAM) 50 MG tablet, Take 1 tablet (50 mg total) every 12 (twelve) hours as needed by mouth., Disp: 60 tablet, Rfl: 0  Review of Systems  Constitutional: Negative.   HENT: Negative.   Eyes: Negative.   Respiratory: Negative.   Cardiovascular: Negative.   Gastrointestinal: Negative.   Endocrine: Negative.   Genitourinary: Negative.   Musculoskeletal: Negative.   Skin: Negative.   Allergic/Immunologic: Negative.   Neurological: Negative.   Hematological: Bruises/bleeds easily.  Psychiatric/Behavioral: Negative.     Social History   Tobacco Use  . Smoking status: Current Every Day Smoker    Packs/day: 0.50    Types: Cigarettes  . Smokeless tobacco: Never Used  Substance Use Topics  . Alcohol use: No    Alcohol/week: 0.0 oz   Objective:   BP 118/72 (BP Location: Right Arm, Patient Position: Sitting, Cuff Size: Normal)   Pulse 82   Temp 97.6 F (36.4 C) (Oral)   Resp 16   Wt 198 lb (  89.8 kg)   BMI 24.75 kg/m  Vitals:   12/22/17 1501  BP: 118/72  Pulse: 82  Resp: 16  Temp: 97.6 F (36.4 C)  TempSrc: Oral  Weight: 198 lb (89.8 kg)     Physical Exam  Constitutional: He appears well-developed and well-nourished.  HENT:  Head: Normocephalic and atraumatic.  Right Ear: External ear normal.  Left Ear: External ear normal.  Nose: Nose normal.  Eyes: Conjunctivae are normal. No scleral icterus.  Neck: No thyromegaly present.  Cardiovascular: Normal rate, regular rhythm and normal heart sounds.  Pulmonary/Chest: Effort normal and breath sounds normal.  Abdominal: Soft.  Neurological: He is alert.  Skin: Skin is warm and dry.  Many SKs.There is some bruising without tenderness along lateral arm just beside his fistular/graft for  dialysis.  Psychiatric: He has a normal mood and affect. His behavior is normal.        Assessment & Plan:     Bruising Use heating pad. No further evaluation needed. I think this is leaking from graft when they stick it proximally. ESRD on dialysis Pt to f/u on routine dialysis. CAD     I have done the exam and reviewed the chart and it is accurate to the best of my knowledge. Development worker, community has been used and  any errors in dictation or transcription are unintentional. Miguel Aschoff M.D. [place], MD  [facility]

## 2017-12-24 DIAGNOSIS — N2581 Secondary hyperparathyroidism of renal origin: Secondary | ICD-10-CM | POA: Diagnosis not present

## 2017-12-24 DIAGNOSIS — D631 Anemia in chronic kidney disease: Secondary | ICD-10-CM | POA: Diagnosis not present

## 2017-12-24 DIAGNOSIS — D689 Coagulation defect, unspecified: Secondary | ICD-10-CM | POA: Diagnosis not present

## 2017-12-24 DIAGNOSIS — N186 End stage renal disease: Secondary | ICD-10-CM | POA: Diagnosis not present

## 2017-12-24 DIAGNOSIS — D509 Iron deficiency anemia, unspecified: Secondary | ICD-10-CM | POA: Diagnosis not present

## 2017-12-26 DIAGNOSIS — N186 End stage renal disease: Secondary | ICD-10-CM | POA: Diagnosis not present

## 2017-12-26 DIAGNOSIS — D689 Coagulation defect, unspecified: Secondary | ICD-10-CM | POA: Diagnosis not present

## 2017-12-26 DIAGNOSIS — D631 Anemia in chronic kidney disease: Secondary | ICD-10-CM | POA: Diagnosis not present

## 2017-12-26 DIAGNOSIS — D509 Iron deficiency anemia, unspecified: Secondary | ICD-10-CM | POA: Diagnosis not present

## 2017-12-26 DIAGNOSIS — N2581 Secondary hyperparathyroidism of renal origin: Secondary | ICD-10-CM | POA: Diagnosis not present

## 2017-12-29 DIAGNOSIS — D631 Anemia in chronic kidney disease: Secondary | ICD-10-CM | POA: Diagnosis not present

## 2017-12-29 DIAGNOSIS — D509 Iron deficiency anemia, unspecified: Secondary | ICD-10-CM | POA: Diagnosis not present

## 2017-12-29 DIAGNOSIS — D689 Coagulation defect, unspecified: Secondary | ICD-10-CM | POA: Diagnosis not present

## 2017-12-29 DIAGNOSIS — N2581 Secondary hyperparathyroidism of renal origin: Secondary | ICD-10-CM | POA: Diagnosis not present

## 2017-12-29 DIAGNOSIS — N186 End stage renal disease: Secondary | ICD-10-CM | POA: Diagnosis not present

## 2018-01-02 DIAGNOSIS — D689 Coagulation defect, unspecified: Secondary | ICD-10-CM | POA: Diagnosis not present

## 2018-01-02 DIAGNOSIS — N2581 Secondary hyperparathyroidism of renal origin: Secondary | ICD-10-CM | POA: Diagnosis not present

## 2018-01-02 DIAGNOSIS — N186 End stage renal disease: Secondary | ICD-10-CM | POA: Diagnosis not present

## 2018-01-02 DIAGNOSIS — D509 Iron deficiency anemia, unspecified: Secondary | ICD-10-CM | POA: Diagnosis not present

## 2018-01-02 DIAGNOSIS — D631 Anemia in chronic kidney disease: Secondary | ICD-10-CM | POA: Diagnosis not present

## 2018-01-05 DIAGNOSIS — I481 Persistent atrial fibrillation: Secondary | ICD-10-CM | POA: Diagnosis not present

## 2018-01-05 DIAGNOSIS — I482 Chronic atrial fibrillation: Secondary | ICD-10-CM | POA: Diagnosis not present

## 2018-01-05 DIAGNOSIS — I10 Essential (primary) hypertension: Secondary | ICD-10-CM | POA: Diagnosis not present

## 2018-01-05 DIAGNOSIS — E7800 Pure hypercholesterolemia, unspecified: Secondary | ICD-10-CM | POA: Diagnosis not present

## 2018-01-05 DIAGNOSIS — I2510 Atherosclerotic heart disease of native coronary artery without angina pectoris: Secondary | ICD-10-CM | POA: Diagnosis not present

## 2018-01-06 DIAGNOSIS — N186 End stage renal disease: Secondary | ICD-10-CM | POA: Diagnosis not present

## 2018-01-06 DIAGNOSIS — D509 Iron deficiency anemia, unspecified: Secondary | ICD-10-CM | POA: Diagnosis not present

## 2018-01-06 DIAGNOSIS — D689 Coagulation defect, unspecified: Secondary | ICD-10-CM | POA: Diagnosis not present

## 2018-01-06 DIAGNOSIS — N2581 Secondary hyperparathyroidism of renal origin: Secondary | ICD-10-CM | POA: Diagnosis not present

## 2018-01-06 DIAGNOSIS — D631 Anemia in chronic kidney disease: Secondary | ICD-10-CM | POA: Diagnosis not present

## 2018-01-07 DIAGNOSIS — N2581 Secondary hyperparathyroidism of renal origin: Secondary | ICD-10-CM | POA: Diagnosis not present

## 2018-01-07 DIAGNOSIS — D509 Iron deficiency anemia, unspecified: Secondary | ICD-10-CM | POA: Diagnosis not present

## 2018-01-07 DIAGNOSIS — N186 End stage renal disease: Secondary | ICD-10-CM | POA: Diagnosis not present

## 2018-01-07 DIAGNOSIS — D689 Coagulation defect, unspecified: Secondary | ICD-10-CM | POA: Diagnosis not present

## 2018-01-07 DIAGNOSIS — D631 Anemia in chronic kidney disease: Secondary | ICD-10-CM | POA: Diagnosis not present

## 2018-01-08 DIAGNOSIS — I120 Hypertensive chronic kidney disease with stage 5 chronic kidney disease or end stage renal disease: Secondary | ICD-10-CM | POA: Diagnosis not present

## 2018-01-08 DIAGNOSIS — N186 End stage renal disease: Secondary | ICD-10-CM | POA: Diagnosis not present

## 2018-01-08 DIAGNOSIS — Z992 Dependence on renal dialysis: Secondary | ICD-10-CM | POA: Diagnosis not present

## 2018-01-09 DIAGNOSIS — N186 End stage renal disease: Secondary | ICD-10-CM | POA: Diagnosis not present

## 2018-01-09 DIAGNOSIS — N2581 Secondary hyperparathyroidism of renal origin: Secondary | ICD-10-CM | POA: Diagnosis not present

## 2018-01-09 DIAGNOSIS — D689 Coagulation defect, unspecified: Secondary | ICD-10-CM | POA: Diagnosis not present

## 2018-01-09 DIAGNOSIS — D509 Iron deficiency anemia, unspecified: Secondary | ICD-10-CM | POA: Diagnosis not present

## 2018-01-09 DIAGNOSIS — D631 Anemia in chronic kidney disease: Secondary | ICD-10-CM | POA: Diagnosis not present

## 2018-01-12 DIAGNOSIS — D631 Anemia in chronic kidney disease: Secondary | ICD-10-CM | POA: Diagnosis not present

## 2018-01-12 DIAGNOSIS — D689 Coagulation defect, unspecified: Secondary | ICD-10-CM | POA: Diagnosis not present

## 2018-01-12 DIAGNOSIS — N2581 Secondary hyperparathyroidism of renal origin: Secondary | ICD-10-CM | POA: Diagnosis not present

## 2018-01-12 DIAGNOSIS — N186 End stage renal disease: Secondary | ICD-10-CM | POA: Diagnosis not present

## 2018-01-12 DIAGNOSIS — D509 Iron deficiency anemia, unspecified: Secondary | ICD-10-CM | POA: Diagnosis not present

## 2018-01-17 DIAGNOSIS — D631 Anemia in chronic kidney disease: Secondary | ICD-10-CM | POA: Diagnosis not present

## 2018-01-17 DIAGNOSIS — N186 End stage renal disease: Secondary | ICD-10-CM | POA: Diagnosis not present

## 2018-01-17 DIAGNOSIS — N2581 Secondary hyperparathyroidism of renal origin: Secondary | ICD-10-CM | POA: Diagnosis not present

## 2018-01-17 DIAGNOSIS — D689 Coagulation defect, unspecified: Secondary | ICD-10-CM | POA: Diagnosis not present

## 2018-01-17 DIAGNOSIS — D509 Iron deficiency anemia, unspecified: Secondary | ICD-10-CM | POA: Diagnosis not present

## 2018-01-19 DIAGNOSIS — N2581 Secondary hyperparathyroidism of renal origin: Secondary | ICD-10-CM | POA: Diagnosis not present

## 2018-01-19 DIAGNOSIS — D509 Iron deficiency anemia, unspecified: Secondary | ICD-10-CM | POA: Diagnosis not present

## 2018-01-19 DIAGNOSIS — D631 Anemia in chronic kidney disease: Secondary | ICD-10-CM | POA: Diagnosis not present

## 2018-01-19 DIAGNOSIS — M79674 Pain in right toe(s): Secondary | ICD-10-CM | POA: Diagnosis not present

## 2018-01-19 DIAGNOSIS — B351 Tinea unguium: Secondary | ICD-10-CM | POA: Diagnosis not present

## 2018-01-19 DIAGNOSIS — D689 Coagulation defect, unspecified: Secondary | ICD-10-CM | POA: Diagnosis not present

## 2018-01-19 DIAGNOSIS — N186 End stage renal disease: Secondary | ICD-10-CM | POA: Diagnosis not present

## 2018-01-19 DIAGNOSIS — M79675 Pain in left toe(s): Secondary | ICD-10-CM | POA: Diagnosis not present

## 2018-01-20 NOTE — Progress Notes (Addendum)
[facility] day:  01/21/2018   Chief Complaint: Frank Huang is a 84 y.o. male with smoldering multiple myeloma who is seen for 6-week assessment.  HPI:  The patient was last seen in the medical oncology clinic on 12/04/2017.  At that time, patient complained of 3 separate episodes of bleeding (11/28/2017 developed left periorbital ecchymosis, 11/29/2017 experienced epistaxis episode, and 12/01/2017 he had bleeding from his dialysis fistula.  Patient denies new medications or herbal products.  He complained of an intermittent cough.  Exam revealed left periorbital ecchymosis; otherwise normal. WBC 4.2 with an ANC of 2800.,  Hematocrit 29.3 hemoglobin 9.4, and platelets 112,000.  Patient was admitted to [facility] 12/15/2017 - 12/17/2017 for chest pain that developed while patient was receiving his hemodialysis treatment.  EKG upon arrival to the ED reveal the patient was in controlled atrial fibrillation at a rate of 80 bpm.  Patient was admitted for rule out MI.  Stress test was ordered for unstable anginal symptoms, however patient was unable to complete the ordered study due to persistent chest pain. Patient underwent left heart cardiac catheterization on 12/17/2017 that demonstrated multivessel coronary artery disease.  There was moderately reduced left ventricular function with an EF of 35%.  Aggressive medical management was recommended.  No stents were placed.  Patient was discharged home on 12/17/2017 with instructions to follow-up with PCP and cardiologist.  In the interim, patient has been doing "fine". He denies any recurrent angina or episodes of increased shortness of breath. Patient notes generalized fatigue. He has increased swelling in his bilateral lower extremities. Patient continues on a Monday, Wednesday, Friday hemodialysis schedule. He has not missed any treatments. Previously reported bleeding/bruising from dialysis fistula has improved.   Patient denies  B symptoms. He has not experienced any recent infections. Patient is well. His weight has decreased 9 pounds since his last visit. Of note, patient came directly to clinic today from his hemodialysis treatment.   Patient has chronic musculoskeletal pain that is well controlled with prescribed tramadol. Patient denies pain in the clinic today.   Past Medical History:  Diagnosis Date  . Cancer ([place])   . Chronic kidney disease   . COPD (chronic obstructive pulmonary disease) ([place])   . Coronary artery disease   . GERD (gastroesophageal reflux disease)   . Hypertension   . Multiple myeloma ([place])   . Myocardial infarction ([place]) 2005  . Neuropathy   . Shortness of breath dyspnea   . Stroke (cerebrum) ([facility])    weakness Lt hand  . Systolic CHF [facility])     Past Surgical History:  Procedure Laterality Date  . APPENDECTOMY    . AV FISTULA PLACEMENT Left   . CARDIAC CATHETERIZATION N/A 11/15/2016   Procedure: Left Heart Cath and Coronary Angiography;  Surgeon: Isaias Cowman, MD;  Location: [facility];  Service: Cardiovascular;  Laterality: N/A;  . CHOLECYSTECTOMY    . LEFT HEART CATH AND CORONARY ANGIOGRAPHY N/A 12/17/2017   Procedure: LEFT HEART CATH AND CORONARY ANGIOGRAPHY and possible PCI and stent;  Surgeon: Yolonda Kida, MD;  Location: [facility];  Service: Cardiovascular;  Laterality: N/A;  . SHOULDER ARTHROSCOPY WITH OPEN ROTATOR CUFF REPAIR Left 09/19/2015   Procedure: SHOULDER ARTHROSCOPY , subacromial decompression, debridement;  Surgeon: Corky Mull, MD;  Location: [facility] ORS;  Service: Orthopedics;  Laterality: Left;  . TOTAL HIP ARTHROPLASTY Left     Family History  Problem Relation Age of Onset  .  Alzheimer's disease Mother   . Kidney failure Father   . Bone cancer Sister   . Stomach cancer Sister     Social History:  reports that he has been smoking cigarettes.  He has been smoking about 0.50 packs per day. he has never used smokeless tobacco. He  reports that he does not drink alcohol or use drugs.  He began smoking at age 29. He smokes 20 packs per month (2 cartons). He has a 30-40 pack year smoking history.  The patient is accompanied by his wife, [name], today.  Allergies: No Known Allergies  Current Medications: Current Outpatient Medications  Medication Sig Dispense Refill  . aspirin EC 81 MG tablet Take 81 mg by mouth daily.    . calcitRIOL (ROCALTROL) 0.5 MCG capsule Take 0.5 mcg by mouth 3 (three) times a week.    . clopidogrel (PLAVIX) 75 MG tablet Take 1 tablet (75 mg total) by mouth daily. 30 tablet 0  . donepezil (ARICEPT) 10 MG tablet TAKE 1 TABLET BY MOUTH AT  BEDTIME 90 tablet 3  . furosemide (LASIX) 80 MG tablet Take 1 tablet (80 mg total) by mouth every Tuesday, Thursday, Saturday, and Sunday. 30 tablet 0  . isosorbide mononitrate (IMDUR) 30 MG 24 hr tablet Take 1 tablet (30 mg total) by mouth daily. 30 tablet 0  . lidocaine-prilocaine (EMLA) cream Apply 1 application topically daily as needed.     . lovastatin (MEVACOR) 40 MG tablet TAKE 1 TABLET BY MOUTH EVERY DAY FOR HIGH CHOLESTEROL    . metoprolol tartrate (LOPRESSOR) 25 MG tablet Take 1 tablet (25 mg total) by mouth 2 (two) times daily. 60 tablet 0  . Multiple Vitamin (MULTI-VITAMINS) TABS Take 1 tablet by mouth daily. Reported on 04/01/2016    . omeprazole (PRILOSEC) 20 MG capsule TAKE 1 CAPSULE BY MOUTH  DAILY. 90 capsule 3  . RENVELA 800 MG tablet Take 1,600 mg by mouth 3 (three) times daily.     . sucralfate (CARAFATE) 1 g tablet Take 1 tablet (1 g total) by mouth 4 (four) times daily -  before meals and at bedtime. 120 tablet 11  . traMADol (ULTRAM) 50 MG tablet Take 1 tablet (50 mg total) every 12 (twelve) hours as needed by mouth. 60 tablet 0   No current facility-administered medications for this visit.     Review of Systems:  GENERAL:  Feels fatigued.  No fevers or sweats.  Weight down 9 pounds; just left dialysis. PERFORMANCE STATUS (ECOG):   1 HEENT:  No visual changes, runny nose, sore throat, mouth sores or tenderness. Lungs: No shortness of breath or cough.  No hemoptysis. Cardiac:  No current chest pain, palpitations, orthopnea, or PND.  Interval chest pain evaluation (see HPI). GI:  No nausea, vomiting, diarrhea, constipation, melena or hematochezia. GU:  Dialysis every MWF, sometimes misses.  No urgency, frequency, dysuria, or hematuria. Musculoskeletal:  Left hip discomfort s/p replacement.  Back pain (chronic).  Shoulder pain.  Right knee issues.  No muscle tenderness. Extremities:  1+ bilateral lower extremity edema; TED hose in place. No pain. Skin:  Bleeding after fistula access.  Bruises easily on baby aspirin.  No rashes or skin changes. Neuro:  Left foot numbness; intermittent. No headache,weakness, balance or coordination issues. Endocrine:  No diabetes, thyroid issues, hot flashes or night sweats. Psych:  No mood changes, depression or anxiety. Pain:  No focal pain. Review of systems:  All other systems reviewed and found to be negative.  Physical Exam:  Blood pressure (!) 90/46, pulse 87, temperature (!) 96 F (35.6 C), temperature source Tympanic, weight 190 lb 7.6 oz (86.4 kg). GENERAL:  Well developed, well nourished, elderly gentleman sitting comfortably in the exam room in no acute distress. MENTAL STATUS:  Alert and oriented to person, place and time. HEAD:  Thin Cyrah Mclamb hair.  Male pattern baldness.  Normocephalic, atraumatic, face symmetric, no Cushingoid features. EYES:  Silver rimmed glasses.  Blue eyes.  Left periorbital ecchymosis.  No conjunctival hemorrhage.  Pupils equal round and reactive to light and accomodation.  No conjunctivitis or scleral icterus. ENT:  Oropharynx clear without lesion.  No palatal petechiae.  Tongue normal.  Dentures.  Mucous membranes moist.  RESPIRATORY:  Dry rales. No wheezes or rhonchi. CARDIOVASCULAR:  Regular rate and rhythm without murmur, rub or gallop. ABDOMEN:  Soft,  non-tender, with active bowel sounds, and no hepatosplenomegaly.  No masses. SKIN:  Multiple moles. No petechiae.  No rashes, ulcers or lesions. EXTREMITIES: Left upper extremity fistula unremarkable.  Chronic bilateral lower extremity edema.  No skin discoloration.  No palpable cords. LYMPH NODES:  No palpable cervical, supraclavicular, axillary or inguinal adenopathy  NEUROLOGICAL: Unremarkable. PSYCH:  Appropriate.   Appointment on 01/21/2018  Component Date Value Ref Range Status  . WBC 01/21/2018 3.7* 3.8 - 10.6 K/uL Final  . RBC 01/21/2018 3.13* 4.40 - 5.90 MIL/uL Final  . Hemoglobin 01/21/2018 10.5* 13.0 - 18.0 g/dL Final  . HCT 01/21/2018 31.8* 40.0 - 52.0 % Final  . MCV 01/21/2018 101.4* 80.0 - 100.0 fL Final  . MCH 01/21/2018 33.6  26.0 - 34.0 pg Final  . MCHC 01/21/2018 33.1  32.0 - 36.0 g/dL Final  . RDW 01/21/2018 16.3* 11.5 - 14.5 % Final  . Platelets 01/21/2018 93* 150 - 440 K/uL Final  . Neutrophils Relative % 01/21/2018 66  % Final  . Neutro Abs 01/21/2018 2.5  1.4 - 6.5 K/uL Final  . Lymphocytes Relative 01/21/2018 20  % Final  . Lymphs Abs 01/21/2018 0.8* 1.0 - 3.6 K/uL Final  . Monocytes Relative 01/21/2018 5  % Final  . Monocytes Absolute 01/21/2018 0.2  0.2 - 1.0 K/uL Final  . Eosinophils Relative 01/21/2018 8  % Final  . Eosinophils Absolute 01/21/2018 0.3  0 - 0.7 K/uL Final  . Basophils Relative 01/21/2018 1  % Final  . Basophils Absolute 01/21/2018 0.0  0 - 0.1 K/uL Final   Performed at [facility], [address], [place], [place] [address]  . Sodium 01/21/2018 135  135 - 145 mmol/L Final  . Potassium 01/21/2018 3.1* 3.5 - 5.1 mmol/L Final  . Chloride 01/21/2018 94* 101 - 111 mmol/L Final  . CO2 01/21/2018 31  22 - 32 mmol/L Final  . Glucose, Bld 01/21/2018 99  65 - 99 mg/dL Final  . BUN 01/21/2018 13  6 - 20 mg/dL Final  . Creatinine, Ser 01/21/2018 2.57* 0.61 - 1.24 mg/dL Final  . Calcium 01/21/2018 8.1* 8.9 - 10.3 mg/dL Final  . Total  Protein 01/21/2018 7.5  6.5 - 8.1 g/dL Final  . Albumin 01/21/2018 3.4* 3.5 - 5.0 g/dL Final  . AST 01/21/2018 13* 15 - 41 U/L Final  . ALT 01/21/2018 9* 17 - 63 U/L Final  . Alkaline Phosphatase 01/21/2018 84  38 - 126 U/L Final  . Total Bilirubin 01/21/2018 1.1  0.3 - 1.2 mg/dL Final  . GFR calc non Af Amer 01/21/2018 22* >60 mL/min Final  . GFR calc Af Amer 01/21/2018 25* >  60 mL/min Final   Comment: (NOTE) The eGFR has been calculated using the CKD EPI equation. This calculation has not been validated in all clinical situations. eGFR's persistently <60 mL/min signify possible Chronic Kidney Disease.   Georgiann Hahn gap 01/21/2018 10  5 - 15 Final   Performed at [facility], [address] [place], [place] [address]    Assessment:  Frank Huang is a 84 y.o. 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  Bone marrow revealed 20% plasma cells. He was treated with thalidomide for 7 months then discontinued in 06/2005 secondary to rash and depression. He was treated briefly with Revlimid in 2013 which was also discontinued secondary to rash.    Bone marrow on 10/07/2017 revealed hypercellular bone marrow for age with plasma cell neoplasms. 18% of the sample was plasma cells.  M spike has been followed: 0.5 gm/dL on 06/15/2013, 0.8 gm/dL on 01/19/2014, 0.5 gm/dL on 11/22/2014, 0.9 on 06/28/2015, 0.7 on 09/20/2015, 0.6 on 01/02/2016, 0.8 on 04/16/2016, 0.6 on 08/21/2016, 0.6 on 11/20/2016, 0.6 on 02/19/2017, 0.9 on 05/21/2017, and 0.7 on 07/09/2017.  Kappa free light chains were 512 in 06/15/2013, 686 11/02/2013, 929 on 01/19/2014, 1212 in 05/10/2014, 1409 on 11/22/2014, 1119 on 02/28/2015, 851 (ratio 63.84) on 06/28/2015, 1006 (ratio 74.50) on 09/20/2015, 1133 (ratio 59.94) on 01/02/2016, 785.6 (ratio 24.32) on 04/16/2016, 834.9 (ratio 19.19) on 08/21/2016, 627 (ratio 24.4) on 02/19/2017, 822.5 (ratio 31.27) on 05/21/2017.  UPEP on  09/05/2017 revealed 418 mg/24 hours of protein.  M-spike was 2.1% (31 mg/24 hours).  Kappa free light chains were 2170, lambda free light chains were 17.80 with a ratio of 121.91 (2.04-10.37).  Immunofixation revealed IgA monoclonal protein with kappa light chain specificity.   He has subsequently been followed off therapy.  He had a hip fracture in in 12/2013.  Per notes, pathology revealed no myeloma.  Bone survey on 01/17/2015 revealed a 9 mm lytic lesion in the frontoparietal region (previously 7 mm). Bone survey on 04/11/2016 revealed posterior parietal region of the skull more prominent.  There was diffuse osteopenia. Bone survey on 05/29/2017 revealed no aggressive lytic or sclerotic osseous lesion of the axial and appendicular skeleton.  He has end-stage renal disease unrelated to his myeloma.  He has been on dialysis since 11/2013 (MWF).   He has anemia due to chronic renal insufficiency.  He has a history of iron deficiency. He had a negative colonoscopy and upper endoscopy in 08/2004. Notes indicate he also had a small bowel capsule study. EGD and colonoscopy in 2014 revealed gastric lesions which were cauterized. He receives IV iron and Procrit with dialysis.  Ferritin is elevated (? acute phase reactant as ESR elevated).  Ferritin was 1121 on 07/05/2015 and 743 on 01/02/2016.  Iron saturation was 30% on 07/05/2015.  He has B12 deficiency.  B12 was 120 on 07/05/2015.  Folate was 10.8.  He was on B12 IM monthy (last on 08/02/2016 with primary care).  He is on oral B12.  B12 was 728 and folate 17.8 on 05/28/2017.   He has fluctuating thrombocytopenia.  Platelet count was normal on 11/14/2016.  Platelet count has ranged between 74,000 - 115,000 in the last 4 months without trend.  Normal studies on 05/28/2017 included: hepatitis B and C testing.  ANA was + with reflex -. He is on no new medications or herbal products.  He has a history of left shoulder discomfort.  MRI on 06/14/2015  revealed a  tendinopathy, osteoarthritis, and capsulitis.   Abdomen and pelvic CT scan on 04/30/2016 revealed a 1.3 cm irregularly marginated nodular lesion in the anterior left lower lobe. There was a subtle area of slightly diminished attenuation near the tail of the pancreas of uncertain significance. There were multiple renal lesions (some did not represent simple cysts).   Chest CT on 05/03/2016 revealed a 1.05 cm ill-defined slightly spiculated irregular nodule in the left lower lobe as well as a 0.8 cm nodule in the right lower lobe.  PET scan on 05/16/2016 revealed no evidence of active skeletal multiple myeloma or plasmacytoma.  The LEFT lower lobe pulmonary nodule was 9 mm and had mild metabolic activity (SUV 1.5).  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  Chest CT on 08/20/2016 revealed interval resolution of bilateral lower lobe nodules c/w inflammatory or infectious process.  Ultrasound-guided biopsy of the right parotid nodule on 06/18/2016 was unsuccessful. He was seen by Dr. Carloyn Manner on 06/24/2016.  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 He is followed by Dr. Pryor Ochoa. Repeat soft tissue ultrasound on 06/30/2017 revealed a stable 1.6 cm nodule.   He had a myocardial infarction on 11/14/2016.  He is being medically managed.  Abdominal ultrasound on 07/22/2017 revealed a normal appearing liver and spleen (7.7 cm). There was no focal lesion identified in the liver with normal direction blood flow.  Symptomatically,  patient is fatigued. He denies any recurrent angina or episodes of increased shortness of breath. Exam reveals increased swelling in his bilateral lower extremities. He continues on dialysis every M,W,F; he has not missed any treatments. Previously reported bleeding/bruising from dialysis fistula has improved.  WBC 3700 with an ANC of 2500.   Hemoglobin 10.5, hematocrit 31.8, and platelets 93,000.  Potassium low at 3.1.  Creatinine 2.57 (CrCl 26 mL/min).  Calcium low at 8.1 (corrected 8.6). SPEP and FLCA pending  Plan: 1.  Labs today: CBC with differential, CMP, SPEP, FLCA 2.  Plan to call patient when SPEP and FLCA result. 3.  Discuss  hypokalemia.  Potassium 3.1.  Patient notes the potassium was high last week at dialysis.  Lab results forwarded to Dr. Juleen China for review. 4.  Discuss  Hypertension.  Patient's blood pressure 90/46 today with no orthostatic changes noted.  Patient came directly from dialysis to clinic.  Nephrology aware. 5.  Discuss pain management.  Patient has chronic arthritic pain.  He uses tramadol on a as needed basis, and notes that it is effective in managing his symptoms. 6.  Continue to follow-up with cardiology as already scheduled.   7.  RTC in 2 months for MD assessment and labs (CBC with diff, CMP, SPEP, FLCA)   Honor Loh, NP  01/22/18, 12:10 PM

## 2018-01-21 ENCOUNTER — Inpatient Hospital Stay: Payer: Medicare Other | Attending: Hematology and Oncology | Admitting: Urgent Care

## 2018-01-21 ENCOUNTER — Inpatient Hospital Stay: Payer: Medicare Other

## 2018-01-21 VITALS — BP 90/46 | HR 87 | Temp 96.0°F | Wt 190.5 lb

## 2018-01-21 DIAGNOSIS — M7989 Other specified soft tissue disorders: Secondary | ICD-10-CM | POA: Insufficient documentation

## 2018-01-21 DIAGNOSIS — N2581 Secondary hyperparathyroidism of renal origin: Secondary | ICD-10-CM | POA: Diagnosis not present

## 2018-01-21 DIAGNOSIS — Z992 Dependence on renal dialysis: Secondary | ICD-10-CM | POA: Insufficient documentation

## 2018-01-21 DIAGNOSIS — D631 Anemia in chronic kidney disease: Secondary | ICD-10-CM | POA: Insufficient documentation

## 2018-01-21 DIAGNOSIS — E538 Deficiency of other specified B group vitamins: Secondary | ICD-10-CM | POA: Diagnosis not present

## 2018-01-21 DIAGNOSIS — I120 Hypertensive chronic kidney disease with stage 5 chronic kidney disease or end stage renal disease: Secondary | ICD-10-CM | POA: Diagnosis not present

## 2018-01-21 DIAGNOSIS — C9000 Multiple myeloma not having achieved remission: Secondary | ICD-10-CM | POA: Insufficient documentation

## 2018-01-21 DIAGNOSIS — D689 Coagulation defect, unspecified: Secondary | ICD-10-CM | POA: Diagnosis not present

## 2018-01-21 DIAGNOSIS — E876 Hypokalemia: Secondary | ICD-10-CM | POA: Diagnosis not present

## 2018-01-21 DIAGNOSIS — N186 End stage renal disease: Secondary | ICD-10-CM | POA: Diagnosis not present

## 2018-01-21 DIAGNOSIS — D509 Iron deficiency anemia, unspecified: Secondary | ICD-10-CM | POA: Diagnosis not present

## 2018-01-21 DIAGNOSIS — D696 Thrombocytopenia, unspecified: Secondary | ICD-10-CM | POA: Diagnosis not present

## 2018-01-21 DIAGNOSIS — I252 Old myocardial infarction: Secondary | ICD-10-CM | POA: Diagnosis not present

## 2018-01-21 LAB — CBC WITH DIFFERENTIAL/PLATELET
Basophils Absolute: 0 10*3/uL (ref 0–0.1)
Basophils Relative: 1 %
Eosinophils Absolute: 0.3 10*3/uL (ref 0–0.7)
Eosinophils Relative: 8 %
HCT: 31.8 % — ABNORMAL LOW (ref 40.0–52.0)
Hemoglobin: 10.5 g/dL — ABNORMAL LOW (ref 13.0–18.0)
Lymphocytes Relative: 20 %
Lymphs Abs: 0.8 10*3/uL — ABNORMAL LOW (ref 1.0–3.6)
MCH: 33.6 pg (ref 26.0–34.0)
MCHC: 33.1 g/dL (ref 32.0–36.0)
MCV: 101.4 fL — ABNORMAL HIGH (ref 80.0–100.0)
Monocytes Absolute: 0.2 10*3/uL (ref 0.2–1.0)
Monocytes Relative: 5 %
Neutro Abs: 2.5 10*3/uL (ref 1.4–6.5)
Neutrophils Relative %: 66 %
Platelets: 93 10*3/uL — ABNORMAL LOW (ref 150–440)
RBC: 3.13 MIL/uL — ABNORMAL LOW (ref 4.40–5.90)
RDW: 16.3 % — ABNORMAL HIGH (ref 11.5–14.5)
WBC: 3.7 10*3/uL — ABNORMAL LOW (ref 3.8–10.6)

## 2018-01-21 LAB — COMPREHENSIVE METABOLIC PANEL
ALT: 9 U/L — ABNORMAL LOW (ref 17–63)
AST: 13 U/L — ABNORMAL LOW (ref 15–41)
Albumin: 3.4 g/dL — ABNORMAL LOW (ref 3.5–5.0)
Alkaline Phosphatase: 84 U/L (ref 38–126)
Anion gap: 10 (ref 5–15)
BUN: 13 mg/dL (ref 6–20)
CO2: 31 mmol/L (ref 22–32)
Calcium: 8.1 mg/dL — ABNORMAL LOW (ref 8.9–10.3)
Chloride: 94 mmol/L — ABNORMAL LOW (ref 101–111)
Creatinine, Ser: 2.57 mg/dL — ABNORMAL HIGH (ref 0.61–1.24)
GFR calc Af Amer: 25 mL/min — ABNORMAL LOW (ref >60)
GFR calc non Af Amer: 22 mL/min — ABNORMAL LOW (ref >60)
Glucose, Bld: 99 mg/dL (ref 65–99)
Potassium: 3.1 mmol/L — ABNORMAL LOW (ref 3.5–5.1)
Sodium: 135 mmol/L (ref 135–145)
Total Bilirubin: 1.1 mg/dL (ref 0.3–1.2)
Total Protein: 7.5 g/dL (ref 6.5–8.1)

## 2018-01-21 NOTE — Progress Notes (Signed)
Patient just came from dialysis - BP sitting 90/46 HR 87, standing 91/47 HR 89.  Patient asymptomatic.

## 2018-01-22 LAB — KAPPA/LAMBDA LIGHT CHAINS
Kappa free light chain: 1224.2 mg/L — ABNORMAL HIGH (ref 3.3–19.4)
Kappa, lambda light chain ratio: 38.02 — ABNORMAL HIGH (ref 0.26–1.65)
Lambda free light chains: 32.2 mg/L — ABNORMAL HIGH (ref 5.7–26.3)

## 2018-01-22 LAB — PROTEIN ELECTROPHORESIS, SERUM
A/G Ratio: 1.2 (ref 0.7–1.7)
Albumin ELP: 3.4 g/dL (ref 2.9–4.4)
Alpha-1-Globulin: 0.3 g/dL (ref 0.0–0.4)
Alpha-2-Globulin: 0.6 g/dL (ref 0.4–1.0)
Beta Globulin: 1.6 g/dL — ABNORMAL HIGH (ref 0.7–1.3)
Gamma Globulin: 0.4 g/dL (ref 0.4–1.8)
Globulin, Total: 2.9 g/dL (ref 2.2–3.9)
M-Spike, %: 0.7 g/dL — ABNORMAL HIGH
Total Protein ELP: 6.3 g/dL (ref 6.0–8.5)

## 2018-01-23 ENCOUNTER — Telehealth: Payer: Self-pay | Admitting: *Deleted

## 2018-01-23 NOTE — Telephone Encounter (Signed)
Called [name] to inform her that patient's labs are stable.  Will continue to monitor.

## 2018-01-26 ENCOUNTER — Ambulatory Visit (INDEPENDENT_AMBULATORY_CARE_PROVIDER_SITE_OTHER): Payer: Medicare Other | Admitting: Family Medicine

## 2018-01-26 ENCOUNTER — Ambulatory Visit: Payer: Medicare Other | Admitting: Family Medicine

## 2018-01-26 VITALS — BP 120/64 | HR 60 | Temp 97.7°F | Resp 14

## 2018-01-26 DIAGNOSIS — N185 Chronic kidney disease, stage 5: Secondary | ICD-10-CM | POA: Diagnosis not present

## 2018-01-26 DIAGNOSIS — C9000 Multiple myeloma not having achieved remission: Secondary | ICD-10-CM | POA: Diagnosis not present

## 2018-01-26 DIAGNOSIS — N2581 Secondary hyperparathyroidism of renal origin: Secondary | ICD-10-CM | POA: Diagnosis not present

## 2018-01-26 DIAGNOSIS — R350 Frequency of micturition: Secondary | ICD-10-CM

## 2018-01-26 DIAGNOSIS — I2510 Atherosclerotic heart disease of native coronary artery without angina pectoris: Secondary | ICD-10-CM | POA: Diagnosis not present

## 2018-01-26 DIAGNOSIS — N186 End stage renal disease: Secondary | ICD-10-CM | POA: Diagnosis not present

## 2018-01-26 DIAGNOSIS — D631 Anemia in chronic kidney disease: Secondary | ICD-10-CM | POA: Diagnosis not present

## 2018-01-26 DIAGNOSIS — D509 Iron deficiency anemia, unspecified: Secondary | ICD-10-CM | POA: Diagnosis not present

## 2018-01-26 DIAGNOSIS — D689 Coagulation defect, unspecified: Secondary | ICD-10-CM | POA: Diagnosis not present

## 2018-01-26 NOTE — Progress Notes (Signed)
Patient: Frank Huang Male    DOB: 1932-07-03   84 y.o.   MRN: [id number] Visit Date: 01/26/2018  Today's Provider: Wilhemena Durie, MD   Chief Complaint  Patient presents with  . Urinary Tract Infection  . Fall   Subjective:    HPI  Pt is needing medical equipment for the home. He is getting weaker and needs a wheelchair, and home wheel chair ramp. He is home bound other than going to dialysis and his doctors appointments.    Over the weekend pt thought he had to go to the bathroom but did not go once he got there. Then this morning, he felt like he could not hold his urine. He also fell twice over the weekend. He has been using a walker. He reports that he feels tired and his legs feel tired and his legs don't want to move. Wife reports that she has a hard time getting him into dialysis and in and out of the car when she gets home. Pt denies dysuria or feeling bad. He reports that at times he does not feel hungry then at times he feels hungry.      No Known Allergies   Current Outpatient Medications:  .  aspirin EC 81 MG tablet, Take 81 mg by mouth daily., Disp: , Rfl:  .  calcitRIOL (ROCALTROL) 0.5 MCG capsule, Take 0.5 mcg by mouth 3 (three) times a week., Disp: , Rfl:  .  clopidogrel (PLAVIX) 75 MG tablet, Take 1 tablet (75 mg total) by mouth daily., Disp: 30 tablet, Rfl: 0 .  donepezil (ARICEPT) 10 MG tablet, TAKE 1 TABLET BY MOUTH AT  BEDTIME, Disp: 90 tablet, Rfl: 3 .  furosemide (LASIX) 80 MG tablet, Take 1 tablet (80 mg total) by mouth every Tuesday, Thursday, Saturday, and Sunday., Disp: 30 tablet, Rfl: 0 .  isosorbide mononitrate (IMDUR) 30 MG 24 hr tablet, Take 1 tablet (30 mg total) by mouth daily., Disp: 30 tablet, Rfl: 0 .  lidocaine-prilocaine (EMLA) cream, Apply 1 application topically daily as needed. , Disp: , Rfl:  .  lovastatin (MEVACOR) 40 MG tablet, TAKE 1 TABLET BY MOUTH EVERY DAY FOR HIGH CHOLESTEROL, Disp: , Rfl:  .  metoprolol tartrate (LOPRESSOR)  25 MG tablet, Take 1 tablet (25 mg total) by mouth 2 (two) times daily., Disp: 60 tablet, Rfl: 0 .  Multiple Vitamin (MULTI-VITAMINS) TABS, Take 1 tablet by mouth daily. Reported on 04/01/2016, Disp: , Rfl:  .  omeprazole (PRILOSEC) 20 MG capsule, TAKE 1 CAPSULE BY MOUTH  DAILY., Disp: 90 capsule, Rfl: 3 .  RENVELA 800 MG tablet, Take 1,600 mg by mouth 3 (three) times daily. , Disp: , Rfl:  .  sucralfate (CARAFATE) 1 g tablet, Take 1 tablet (1 g total) by mouth 4 (four) times daily -  before meals and at bedtime., Disp: 120 tablet, Rfl: 11 .  traMADol (ULTRAM) 50 MG tablet, Take 1 tablet (50 mg total) every 12 (twelve) hours as needed by mouth., Disp: 60 tablet, Rfl: 0  Review of Systems  Constitutional: Positive for activity change, appetite change and fatigue.  HENT: Negative.   Eyes: Negative.   Respiratory: Positive for shortness of breath (but not new).   Cardiovascular: Positive for leg swelling.  Gastrointestinal: Negative.   Endocrine: Negative.   Genitourinary: Positive for enuresis and frequency.  Musculoskeletal: Negative.   Skin: Negative.   Allergic/Immunologic: Negative.   Neurological: Positive for weakness (leg).  Hematological: Negative.  Psychiatric/Behavioral: Positive for confusion.    Social History   Tobacco Use  . Smoking status: Current Every Day Smoker    Packs/day: 0.50    Types: Cigarettes  . Smokeless tobacco: Never Used  Substance Use Topics  . Alcohol use: No    Alcohol/week: 0.0 oz   Objective:   BP 120/64 (BP Location: Right Arm, Patient Position: Sitting, Cuff Size: Normal)   Pulse 60   Temp 97.7 F (36.5 C) (Oral)   Resp 14  Vitals:   01/26/18 1042  BP: 120/64  Pulse: 60  Resp: 14  Temp: 97.7 F (36.5 C)  TempSrc: Oral     Physical Exam  Constitutional: He is oriented to person, place, and time. He appears well-developed and well-nourished.  HENT:  Head: Normocephalic and atraumatic.  Eyes: Conjunctivae are normal. No scleral  icterus.  Neck: No thyromegaly present.  Cardiovascular: Normal rate, regular rhythm and normal heart sounds.  Pulmonary/Chest: Effort normal and breath sounds normal.  Abdominal: Soft.  Neurological: He is alert and oriented to person, place, and time.  Skin: Skin is warm and dry.  Psychiatric: He has a normal mood and affect.        Assessment & Plan:     1. Chronic kidney disease (CKD), stage V ([place]) More than 50% of 25 minute visit spent in counseling. - Ambulatory referral to [place]  2. Multiple myeloma, remission status unspecified ([place])  - Ambulatory referral to [place]  3. Arteriosclerosis of coronary artery  - Ambulatory referral to Home Health  4. Urinary frequency  - Urine Culture 5.Alzheimers dementia  I have done the exam and reviewed the chart and it is accurate to the best of my knowledge. Development worker, community has been used and  any errors in dictation or transcription are unintentional. Miguel Aschoff M.D. [place], MD  [facility]

## 2018-01-27 ENCOUNTER — Telehealth: Payer: Self-pay | Admitting: Family Medicine

## 2018-01-27 DIAGNOSIS — R350 Frequency of micturition: Secondary | ICD-10-CM | POA: Diagnosis not present

## 2018-01-27 NOTE — Telephone Encounter (Signed)
Tried to call pt. No answer. Have received forms but have to get filled out by Dr. Rosanna Randy and have to wait for him to finish OV note from yesterday. Just received form this afternoon.

## 2018-01-27 NOTE — Telephone Encounter (Signed)
Pt's wife requesting a status on a [place] sent over to Dr. Rosanna Randy regarding the wheelchair.  Per the pt's wife, [place] stated they needed more information from the provider before they cover the wheelchair.

## 2018-01-28 DIAGNOSIS — D631 Anemia in chronic kidney disease: Secondary | ICD-10-CM | POA: Diagnosis not present

## 2018-01-28 DIAGNOSIS — N186 End stage renal disease: Secondary | ICD-10-CM | POA: Diagnosis not present

## 2018-01-28 DIAGNOSIS — N2581 Secondary hyperparathyroidism of renal origin: Secondary | ICD-10-CM | POA: Diagnosis not present

## 2018-01-28 DIAGNOSIS — D509 Iron deficiency anemia, unspecified: Secondary | ICD-10-CM | POA: Diagnosis not present

## 2018-01-28 DIAGNOSIS — D689 Coagulation defect, unspecified: Secondary | ICD-10-CM | POA: Diagnosis not present

## 2018-01-28 NOTE — Telephone Encounter (Signed)
Frank Huang spoke with Frank Huang and she told her as below.

## 2018-01-29 DIAGNOSIS — R350 Frequency of micturition: Secondary | ICD-10-CM | POA: Diagnosis not present

## 2018-01-29 DIAGNOSIS — N186 End stage renal disease: Secondary | ICD-10-CM | POA: Diagnosis not present

## 2018-01-29 DIAGNOSIS — I481 Persistent atrial fibrillation: Secondary | ICD-10-CM | POA: Diagnosis not present

## 2018-01-29 DIAGNOSIS — J449 Chronic obstructive pulmonary disease, unspecified: Secondary | ICD-10-CM | POA: Diagnosis not present

## 2018-01-29 DIAGNOSIS — C9001 Multiple myeloma in remission: Secondary | ICD-10-CM | POA: Diagnosis not present

## 2018-01-29 DIAGNOSIS — I132 Hypertensive heart and chronic kidney disease with heart failure and with stage 5 chronic kidney disease, or end stage renal disease: Secondary | ICD-10-CM | POA: Diagnosis not present

## 2018-01-29 DIAGNOSIS — M109 Gout, unspecified: Secondary | ICD-10-CM | POA: Diagnosis not present

## 2018-01-29 DIAGNOSIS — M1990 Unspecified osteoarthritis, unspecified site: Secondary | ICD-10-CM | POA: Diagnosis not present

## 2018-01-29 DIAGNOSIS — I739 Peripheral vascular disease, unspecified: Secondary | ICD-10-CM | POA: Diagnosis not present

## 2018-01-29 DIAGNOSIS — D631 Anemia in chronic kidney disease: Secondary | ICD-10-CM | POA: Diagnosis not present

## 2018-01-29 DIAGNOSIS — K219 Gastro-esophageal reflux disease without esophagitis: Secondary | ICD-10-CM | POA: Diagnosis not present

## 2018-01-29 DIAGNOSIS — N185 Chronic kidney disease, stage 5: Secondary | ICD-10-CM | POA: Diagnosis not present

## 2018-01-29 DIAGNOSIS — I509 Heart failure, unspecified: Secondary | ICD-10-CM | POA: Diagnosis not present

## 2018-01-29 DIAGNOSIS — I2510 Atherosclerotic heart disease of native coronary artery without angina pectoris: Secondary | ICD-10-CM | POA: Diagnosis not present

## 2018-01-29 DIAGNOSIS — C9000 Multiple myeloma not having achieved remission: Secondary | ICD-10-CM | POA: Diagnosis not present

## 2018-01-29 DIAGNOSIS — G629 Polyneuropathy, unspecified: Secondary | ICD-10-CM | POA: Diagnosis not present

## 2018-01-30 ENCOUNTER — Telehealth: Payer: Self-pay

## 2018-01-30 DIAGNOSIS — N2581 Secondary hyperparathyroidism of renal origin: Secondary | ICD-10-CM | POA: Diagnosis not present

## 2018-01-30 DIAGNOSIS — N186 End stage renal disease: Secondary | ICD-10-CM | POA: Diagnosis not present

## 2018-01-30 DIAGNOSIS — D509 Iron deficiency anemia, unspecified: Secondary | ICD-10-CM | POA: Diagnosis not present

## 2018-01-30 DIAGNOSIS — D689 Coagulation defect, unspecified: Secondary | ICD-10-CM | POA: Diagnosis not present

## 2018-01-30 DIAGNOSIS — D631 Anemia in chronic kidney disease: Secondary | ICD-10-CM | POA: Diagnosis not present

## 2018-01-30 MED ORDER — DOXYCYCLINE HYCLATE 100 MG PO TABS: 100.0000 mg | ORAL_TABLET | Freq: Every day | ORAL | 0 refills | Status: DC

## 2018-01-30 NOTE — Telephone Encounter (Signed)
Wife advised. 

## 2018-01-30 NOTE — Telephone Encounter (Signed)
-----   Message from [name]., MD sent at 01/30/2018  2:21 PM EDT ----- UTI--Doxycycline 100mg  daily for 5 days.

## 2018-01-31 LAB — URINE CULTURE

## 2018-02-03 DIAGNOSIS — D509 Iron deficiency anemia, unspecified: Secondary | ICD-10-CM | POA: Diagnosis not present

## 2018-02-03 DIAGNOSIS — D689 Coagulation defect, unspecified: Secondary | ICD-10-CM | POA: Diagnosis not present

## 2018-02-03 DIAGNOSIS — N186 End stage renal disease: Secondary | ICD-10-CM | POA: Diagnosis not present

## 2018-02-03 DIAGNOSIS — N2581 Secondary hyperparathyroidism of renal origin: Secondary | ICD-10-CM | POA: Diagnosis not present

## 2018-02-03 DIAGNOSIS — D631 Anemia in chronic kidney disease: Secondary | ICD-10-CM | POA: Diagnosis not present

## 2018-02-04 DIAGNOSIS — N186 End stage renal disease: Secondary | ICD-10-CM | POA: Diagnosis not present

## 2018-02-04 DIAGNOSIS — J449 Chronic obstructive pulmonary disease, unspecified: Secondary | ICD-10-CM | POA: Diagnosis not present

## 2018-02-04 DIAGNOSIS — D631 Anemia in chronic kidney disease: Secondary | ICD-10-CM | POA: Diagnosis not present

## 2018-02-04 DIAGNOSIS — C9001 Multiple myeloma in remission: Secondary | ICD-10-CM | POA: Diagnosis not present

## 2018-02-04 DIAGNOSIS — G629 Polyneuropathy, unspecified: Secondary | ICD-10-CM | POA: Diagnosis not present

## 2018-02-04 DIAGNOSIS — K219 Gastro-esophageal reflux disease without esophagitis: Secondary | ICD-10-CM | POA: Diagnosis not present

## 2018-02-04 DIAGNOSIS — I2510 Atherosclerotic heart disease of native coronary artery without angina pectoris: Secondary | ICD-10-CM | POA: Diagnosis not present

## 2018-02-04 DIAGNOSIS — I132 Hypertensive heart and chronic kidney disease with heart failure and with stage 5 chronic kidney disease, or end stage renal disease: Secondary | ICD-10-CM | POA: Diagnosis not present

## 2018-02-04 DIAGNOSIS — I481 Persistent atrial fibrillation: Secondary | ICD-10-CM | POA: Diagnosis not present

## 2018-02-04 DIAGNOSIS — M1990 Unspecified osteoarthritis, unspecified site: Secondary | ICD-10-CM | POA: Diagnosis not present

## 2018-02-04 DIAGNOSIS — I509 Heart failure, unspecified: Secondary | ICD-10-CM | POA: Diagnosis not present

## 2018-02-04 DIAGNOSIS — I739 Peripheral vascular disease, unspecified: Secondary | ICD-10-CM | POA: Diagnosis not present

## 2018-02-04 DIAGNOSIS — M109 Gout, unspecified: Secondary | ICD-10-CM | POA: Diagnosis not present

## 2018-02-06 ENCOUNTER — Telehealth: Payer: Self-pay | Admitting: Family Medicine

## 2018-02-06 DIAGNOSIS — I481 Persistent atrial fibrillation: Secondary | ICD-10-CM | POA: Diagnosis not present

## 2018-02-06 DIAGNOSIS — K219 Gastro-esophageal reflux disease without esophagitis: Secondary | ICD-10-CM | POA: Diagnosis not present

## 2018-02-06 DIAGNOSIS — I132 Hypertensive heart and chronic kidney disease with heart failure and with stage 5 chronic kidney disease, or end stage renal disease: Secondary | ICD-10-CM | POA: Diagnosis not present

## 2018-02-06 DIAGNOSIS — I509 Heart failure, unspecified: Secondary | ICD-10-CM | POA: Diagnosis not present

## 2018-02-06 DIAGNOSIS — C9001 Multiple myeloma in remission: Secondary | ICD-10-CM | POA: Diagnosis not present

## 2018-02-06 DIAGNOSIS — I739 Peripheral vascular disease, unspecified: Secondary | ICD-10-CM | POA: Diagnosis not present

## 2018-02-06 DIAGNOSIS — N186 End stage renal disease: Secondary | ICD-10-CM | POA: Diagnosis not present

## 2018-02-06 DIAGNOSIS — M109 Gout, unspecified: Secondary | ICD-10-CM | POA: Diagnosis not present

## 2018-02-06 DIAGNOSIS — N185 Chronic kidney disease, stage 5: Secondary | ICD-10-CM

## 2018-02-06 DIAGNOSIS — D631 Anemia in chronic kidney disease: Secondary | ICD-10-CM | POA: Diagnosis not present

## 2018-02-06 DIAGNOSIS — M1990 Unspecified osteoarthritis, unspecified site: Secondary | ICD-10-CM | POA: Diagnosis not present

## 2018-02-06 DIAGNOSIS — I2510 Atherosclerotic heart disease of native coronary artery without angina pectoris: Secondary | ICD-10-CM | POA: Diagnosis not present

## 2018-02-06 DIAGNOSIS — G629 Polyneuropathy, unspecified: Secondary | ICD-10-CM | POA: Diagnosis not present

## 2018-02-06 DIAGNOSIS — J449 Chronic obstructive pulmonary disease, unspecified: Secondary | ICD-10-CM | POA: Diagnosis not present

## 2018-02-06 NOTE — Telephone Encounter (Signed)
Face to face next week to discuss palliative care with pt/wife.

## 2018-02-06 NOTE — Telephone Encounter (Signed)
Robin with [facility] called wanting to talk to a nurse regarding pt.  She states he refused to go to dialysis on Thursday, yesterday, not eating, dementia worse.  BP 106/50.  Just wants to sleep, legs swollen.  Wants to maybe suggest palliative care to come in.   Robin's call back [phone]  Thanks teri

## 2018-02-08 DIAGNOSIS — N186 End stage renal disease: Secondary | ICD-10-CM | POA: Diagnosis not present

## 2018-02-09 ENCOUNTER — Other Ambulatory Visit: Payer: Self-pay

## 2018-02-09 ENCOUNTER — Inpatient Hospital Stay
Admission: EM | Admit: 2018-02-09 | Discharge: 2018-02-13 | DRG: 377 | Disposition: A | Discharge status: Hospice | Payer: Medicare Other | Attending: Internal Medicine | Admitting: Internal Medicine

## 2018-02-09 ENCOUNTER — Encounter: Payer: Self-pay | Admitting: Emergency Medicine

## 2018-02-09 DIAGNOSIS — I482 Chronic atrial fibrillation: Secondary | ICD-10-CM | POA: Diagnosis present

## 2018-02-09 DIAGNOSIS — I5023 Acute on chronic systolic (congestive) heart failure: Secondary | ICD-10-CM | POA: Diagnosis present

## 2018-02-09 DIAGNOSIS — K219 Gastro-esophageal reflux disease without esophagitis: Secondary | ICD-10-CM | POA: Diagnosis present

## 2018-02-09 DIAGNOSIS — C9001 Multiple myeloma in remission: Secondary | ICD-10-CM | POA: Diagnosis not present

## 2018-02-09 DIAGNOSIS — K2990 Gastroduodenitis, unspecified, without bleeding: Secondary | ICD-10-CM | POA: Diagnosis not present

## 2018-02-09 DIAGNOSIS — R5383 Other fatigue: Secondary | ICD-10-CM

## 2018-02-09 DIAGNOSIS — I9589 Other hypotension: Secondary | ICD-10-CM | POA: Diagnosis present

## 2018-02-09 DIAGNOSIS — N186 End stage renal disease: Secondary | ICD-10-CM

## 2018-02-09 DIAGNOSIS — N2581 Secondary hyperparathyroidism of renal origin: Secondary | ICD-10-CM | POA: Diagnosis present

## 2018-02-09 DIAGNOSIS — I252 Old myocardial infarction: Secondary | ICD-10-CM | POA: Diagnosis not present

## 2018-02-09 DIAGNOSIS — K921 Melena: Principal | ICD-10-CM

## 2018-02-09 DIAGNOSIS — K625 Hemorrhage of anus and rectum: Secondary | ICD-10-CM | POA: Diagnosis not present

## 2018-02-09 DIAGNOSIS — I2510 Atherosclerotic heart disease of native coronary artery without angina pectoris: Secondary | ICD-10-CM | POA: Diagnosis not present

## 2018-02-09 DIAGNOSIS — J449 Chronic obstructive pulmonary disease, unspecified: Secondary | ICD-10-CM | POA: Diagnosis present

## 2018-02-09 DIAGNOSIS — I481 Persistent atrial fibrillation: Secondary | ICD-10-CM | POA: Diagnosis not present

## 2018-02-09 DIAGNOSIS — I69354 Hemiplegia and hemiparesis following cerebral infarction affecting left non-dominant side: Secondary | ICD-10-CM

## 2018-02-09 DIAGNOSIS — K922 Gastrointestinal hemorrhage, unspecified: Secondary | ICD-10-CM | POA: Diagnosis present

## 2018-02-09 DIAGNOSIS — Z96642 Presence of left artificial hip joint: Secondary | ICD-10-CM | POA: Diagnosis not present

## 2018-02-09 DIAGNOSIS — Z5329 Procedure and treatment not carried out because of patient's decision for other reasons: Secondary | ICD-10-CM | POA: Diagnosis not present

## 2018-02-09 DIAGNOSIS — M1990 Unspecified osteoarthritis, unspecified site: Secondary | ICD-10-CM | POA: Diagnosis not present

## 2018-02-09 DIAGNOSIS — I132 Hypertensive heart and chronic kidney disease with heart failure and with stage 5 chronic kidney disease, or end stage renal disease: Secondary | ICD-10-CM | POA: Diagnosis not present

## 2018-02-09 DIAGNOSIS — Z7982 Long term (current) use of aspirin: Secondary | ICD-10-CM

## 2018-02-09 DIAGNOSIS — K5900 Constipation, unspecified: Secondary | ICD-10-CM | POA: Diagnosis present

## 2018-02-09 DIAGNOSIS — Z9049 Acquired absence of other specified parts of digestive tract: Secondary | ICD-10-CM

## 2018-02-09 DIAGNOSIS — Z7189 Other specified counseling: Secondary | ICD-10-CM | POA: Diagnosis not present

## 2018-02-09 DIAGNOSIS — Z7401 Bed confinement status: Secondary | ICD-10-CM | POA: Diagnosis not present

## 2018-02-09 DIAGNOSIS — Z955 Presence of coronary angioplasty implant and graft: Secondary | ICD-10-CM

## 2018-02-09 DIAGNOSIS — C9000 Multiple myeloma not having achieved remission: Secondary | ICD-10-CM | POA: Diagnosis present

## 2018-02-09 DIAGNOSIS — K2980 Duodenitis without bleeding: Secondary | ICD-10-CM | POA: Diagnosis not present

## 2018-02-09 DIAGNOSIS — Z992 Dependence on renal dialysis: Secondary | ICD-10-CM

## 2018-02-09 DIAGNOSIS — G629 Polyneuropathy, unspecified: Secondary | ICD-10-CM | POA: Diagnosis not present

## 2018-02-09 DIAGNOSIS — R300 Dysuria: Secondary | ICD-10-CM

## 2018-02-09 DIAGNOSIS — D631 Anemia in chronic kidney disease: Secondary | ICD-10-CM | POA: Diagnosis not present

## 2018-02-09 DIAGNOSIS — I509 Heart failure, unspecified: Secondary | ICD-10-CM | POA: Diagnosis not present

## 2018-02-09 DIAGNOSIS — K2970 Gastritis, unspecified, without bleeding: Secondary | ICD-10-CM | POA: Diagnosis not present

## 2018-02-09 DIAGNOSIS — M6281 Muscle weakness (generalized): Secondary | ICD-10-CM | POA: Diagnosis not present

## 2018-02-09 DIAGNOSIS — R531 Weakness: Secondary | ICD-10-CM | POA: Diagnosis not present

## 2018-02-09 DIAGNOSIS — Z515 Encounter for palliative care: Secondary | ICD-10-CM | POA: Diagnosis present

## 2018-02-09 DIAGNOSIS — I739 Peripheral vascular disease, unspecified: Secondary | ICD-10-CM | POA: Diagnosis not present

## 2018-02-09 DIAGNOSIS — M549 Dorsalgia, unspecified: Secondary | ICD-10-CM

## 2018-02-09 DIAGNOSIS — Z9115 Patient's noncompliance with renal dialysis: Secondary | ICD-10-CM

## 2018-02-09 DIAGNOSIS — I10 Essential (primary) hypertension: Secondary | ICD-10-CM | POA: Diagnosis not present

## 2018-02-09 DIAGNOSIS — I4891 Unspecified atrial fibrillation: Secondary | ICD-10-CM | POA: Diagnosis not present

## 2018-02-09 DIAGNOSIS — F17210 Nicotine dependence, cigarettes, uncomplicated: Secondary | ICD-10-CM | POA: Diagnosis present

## 2018-02-09 DIAGNOSIS — Z66 Do not resuscitate: Secondary | ICD-10-CM | POA: Diagnosis not present

## 2018-02-09 DIAGNOSIS — M109 Gout, unspecified: Secondary | ICD-10-CM | POA: Diagnosis not present

## 2018-02-09 DIAGNOSIS — Z7902 Long term (current) use of antithrombotics/antiplatelets: Secondary | ICD-10-CM

## 2018-02-09 DIAGNOSIS — I120 Hypertensive chronic kidney disease with stage 5 chronic kidney disease or end stage renal disease: Secondary | ICD-10-CM | POA: Diagnosis not present

## 2018-02-09 LAB — CBC
HCT: 25.2 % — ABNORMAL LOW (ref 40.0–52.0)
Hemoglobin: 8.2 g/dL — ABNORMAL LOW (ref 13.0–18.0)
MCH: 31.9 pg (ref 26.0–34.0)
MCHC: 32.7 g/dL (ref 32.0–36.0)
MCV: 97.6 fL (ref 80.0–100.0)
Platelets: 146 10*3/uL — ABNORMAL LOW (ref 150–440)
RBC: 2.58 MIL/uL — ABNORMAL LOW (ref 4.40–5.90)
RDW: 17.4 % — ABNORMAL HIGH (ref 11.5–14.5)
WBC: 5.8 10*3/uL (ref 3.8–10.6)

## 2018-02-09 LAB — COMPREHENSIVE METABOLIC PANEL
ALK PHOS: 86 U/L (ref 38–126)
ALT: 10 U/L — AB (ref 17–63)
AST: 19 U/L (ref 15–41)
Albumin: 2.9 g/dL — ABNORMAL LOW (ref 3.5–5.0)
Anion gap: 16 — ABNORMAL HIGH (ref 5–15)
BUN: 88 mg/dL — AB (ref 6–20)
CALCIUM: 9.3 mg/dL (ref 8.9–10.3)
CO2: 25 mmol/L (ref 22–32)
CREATININE: 8.56 mg/dL — AB (ref 0.61–1.24)
Chloride: 97 mmol/L — ABNORMAL LOW (ref 101–111)
GFR calc Af Amer: 6 mL/min — ABNORMAL LOW (ref >60)
GFR calc non Af Amer: 5 mL/min — ABNORMAL LOW (ref >60)
GLUCOSE: 112 mg/dL — AB (ref 65–99)
Potassium: 4.7 mmol/L (ref 3.5–5.1)
SODIUM: 138 mmol/L (ref 135–145)
Total Bilirubin: 1.4 mg/dL — ABNORMAL HIGH (ref 0.3–1.2)
Total Protein: 7.3 g/dL (ref 6.5–8.1)

## 2018-02-09 LAB — PROTIME-INR
INR: 1.17
Prothrombin Time: 14.8 seconds (ref 11.4–15.2)

## 2018-02-09 NOTE — Telephone Encounter (Signed)
Spoke with wife and order placed.

## 2018-02-09 NOTE — ED Notes (Addendum)
Pt to the er for difficulty getting to the bedside commode. Pt had a bowel accident and wife noted it was tar black. Wife states it was consistency of pudding. Pt denies abdominal pain. Wife did not note any blood when wiping pt. Pt does make small amounts of urine. Pt denies feeling dizzy. Pt reports just feeling just wore out. VSS. Per wife, BP is his normal. Pt has missed 2 days of dialysis this week due to feeling bad. Pt has 2 plus pitting edema to the lower extremities bilaterally.

## 2018-02-09 NOTE — Telephone Encounter (Signed)
[facility] calling to find out what needs to be done and update status.  States pt skipped dialysis  twice, pt is swollen and his color is horrible.  Pt wants to just sleep all day.   Santiago Glad stats wife is waiting to get a call back from the office.  She states she spoke to the wife about palliative care and wife is on board.

## 2018-02-09 NOTE — ED Triage Notes (Signed)
Pts wife states that he had a bowel movement today, and it was dark tarry stools. He is not eating is swollen in his arms and legs. He has missed his dialysis twice this week.

## 2018-02-10 DIAGNOSIS — I9589 Other hypotension: Secondary | ICD-10-CM | POA: Diagnosis present

## 2018-02-10 DIAGNOSIS — I132 Hypertensive heart and chronic kidney disease with heart failure and with stage 5 chronic kidney disease, or end stage renal disease: Secondary | ICD-10-CM | POA: Diagnosis present

## 2018-02-10 DIAGNOSIS — I482 Chronic atrial fibrillation: Secondary | ICD-10-CM | POA: Diagnosis present

## 2018-02-10 DIAGNOSIS — I5023 Acute on chronic systolic (congestive) heart failure: Secondary | ICD-10-CM | POA: Diagnosis present

## 2018-02-10 DIAGNOSIS — Z7189 Other specified counseling: Secondary | ICD-10-CM | POA: Diagnosis not present

## 2018-02-10 DIAGNOSIS — I252 Old myocardial infarction: Secondary | ICD-10-CM | POA: Diagnosis not present

## 2018-02-10 DIAGNOSIS — F17210 Nicotine dependence, cigarettes, uncomplicated: Secondary | ICD-10-CM | POA: Diagnosis present

## 2018-02-10 DIAGNOSIS — K2980 Duodenitis without bleeding: Secondary | ICD-10-CM | POA: Diagnosis present

## 2018-02-10 DIAGNOSIS — R531 Weakness: Secondary | ICD-10-CM | POA: Diagnosis present

## 2018-02-10 DIAGNOSIS — Z515 Encounter for palliative care: Secondary | ICD-10-CM | POA: Diagnosis present

## 2018-02-10 DIAGNOSIS — K922 Gastrointestinal hemorrhage, unspecified: Secondary | ICD-10-CM | POA: Diagnosis present

## 2018-02-10 DIAGNOSIS — K2970 Gastritis, unspecified, without bleeding: Secondary | ICD-10-CM | POA: Diagnosis present

## 2018-02-10 DIAGNOSIS — J449 Chronic obstructive pulmonary disease, unspecified: Secondary | ICD-10-CM | POA: Diagnosis present

## 2018-02-10 DIAGNOSIS — Z992 Dependence on renal dialysis: Secondary | ICD-10-CM | POA: Diagnosis not present

## 2018-02-10 DIAGNOSIS — N186 End stage renal disease: Secondary | ICD-10-CM | POA: Diagnosis present

## 2018-02-10 DIAGNOSIS — D631 Anemia in chronic kidney disease: Secondary | ICD-10-CM | POA: Diagnosis present

## 2018-02-10 DIAGNOSIS — N2581 Secondary hyperparathyroidism of renal origin: Secondary | ICD-10-CM | POA: Diagnosis present

## 2018-02-10 DIAGNOSIS — C9000 Multiple myeloma not having achieved remission: Secondary | ICD-10-CM | POA: Diagnosis present

## 2018-02-10 DIAGNOSIS — K921 Melena: Secondary | ICD-10-CM | POA: Diagnosis present

## 2018-02-10 DIAGNOSIS — I2510 Atherosclerotic heart disease of native coronary artery without angina pectoris: Secondary | ICD-10-CM | POA: Diagnosis present

## 2018-02-10 DIAGNOSIS — Z96642 Presence of left artificial hip joint: Secondary | ICD-10-CM | POA: Diagnosis present

## 2018-02-10 DIAGNOSIS — K5900 Constipation, unspecified: Secondary | ICD-10-CM | POA: Diagnosis present

## 2018-02-10 DIAGNOSIS — Z5329 Procedure and treatment not carried out because of patient's decision for other reasons: Secondary | ICD-10-CM | POA: Diagnosis not present

## 2018-02-10 DIAGNOSIS — G629 Polyneuropathy, unspecified: Secondary | ICD-10-CM | POA: Diagnosis present

## 2018-02-10 DIAGNOSIS — Z66 Do not resuscitate: Secondary | ICD-10-CM | POA: Diagnosis present

## 2018-02-10 DIAGNOSIS — K219 Gastro-esophageal reflux disease without esophagitis: Secondary | ICD-10-CM | POA: Diagnosis present

## 2018-02-10 DIAGNOSIS — R5383 Other fatigue: Secondary | ICD-10-CM | POA: Diagnosis not present

## 2018-02-10 DIAGNOSIS — I69354 Hemiplegia and hemiparesis following cerebral infarction affecting left non-dominant side: Secondary | ICD-10-CM | POA: Diagnosis not present

## 2018-02-10 LAB — TYPE AND SCREEN
ABO/RH(D): B POS
Antibody Screen: NEGATIVE

## 2018-02-10 LAB — URINALYSIS, COMPLETE (UACMP) WITH MICROSCOPIC
Bacteria, UA: NONE SEEN
Bilirubin Urine: NEGATIVE
Glucose, UA: NEGATIVE mg/dL
Ketones, ur: NEGATIVE mg/dL
NITRITE: NEGATIVE
PH: 6 (ref 5.0–8.0)
Protein, ur: 100 mg/dL — AB
SPECIFIC GRAVITY, URINE: 1.012 (ref 1.005–1.030)
Squamous Epithelial / LPF: NONE SEEN

## 2018-02-10 LAB — CBC
HCT: 23.4 % — ABNORMAL LOW (ref 40.0–52.0)
HEMOGLOBIN: 7.6 g/dL — AB (ref 13.0–18.0)
MCH: 31.8 pg (ref 26.0–34.0)
MCHC: 32.5 g/dL (ref 32.0–36.0)
MCV: 97.7 fL (ref 80.0–100.0)
PLATELETS: 131 10*3/uL — AB (ref 150–440)
RBC: 2.39 MIL/uL — ABNORMAL LOW (ref 4.40–5.90)
RDW: 17.5 % — AB (ref 11.5–14.5)
WBC: 4.4 10*3/uL (ref 3.8–10.6)

## 2018-02-10 LAB — HEMOGLOBIN AND HEMATOCRIT, BLOOD
HCT: 24.4 % — ABNORMAL LOW (ref 40.0–52.0)
HEMATOCRIT: 23.5 % — AB (ref 40.0–52.0)
HEMOGLOBIN: 7.8 g/dL — AB (ref 13.0–18.0)
Hemoglobin: 7.4 g/dL — ABNORMAL LOW (ref 13.0–18.0)

## 2018-02-10 LAB — BASIC METABOLIC PANEL
ANION GAP: 13 (ref 5–15)
BUN: 87 mg/dL — AB (ref 6–20)
CALCIUM: 9.1 mg/dL (ref 8.9–10.3)
CO2: 27 mmol/L (ref 22–32)
CREATININE: 8.8 mg/dL — AB (ref 0.61–1.24)
Chloride: 100 mmol/L — ABNORMAL LOW (ref 101–111)
GFR calc Af Amer: 6 mL/min — ABNORMAL LOW (ref >60)
GFR, EST NON AFRICAN AMERICAN: 5 mL/min — AB (ref >60)
GLUCOSE: 96 mg/dL (ref 65–99)
Potassium: 4.5 mmol/L (ref 3.5–5.1)
Sodium: 140 mmol/L (ref 135–145)

## 2018-02-10 LAB — TROPONIN I: TROPONIN I: 0.05 ng/mL — AB (ref <0.03)

## 2018-02-10 LAB — LACTIC ACID, PLASMA
Lactic Acid, Venous: 1 mmol/L (ref 0.5–1.9)
Lactic Acid, Venous: 1.1 mmol/L (ref 0.5–1.9)

## 2018-02-10 MED ORDER — SEVELAMER CARBONATE 800 MG PO TABS
1600.0000 mg | ORAL_TABLET | Freq: Three times a day (TID) | ORAL | Status: DC
  Administered 2018-02-10 – 2018-02-11 (×5): 1600 mg via ORAL
  Filled 2018-02-10 (×6): qty 2

## 2018-02-10 MED ORDER — RENA-VITE PO TABS
1.0000 | ORAL_TABLET | Freq: Every day | ORAL | Status: DC
  Administered 2018-02-10: 1 via ORAL
  Filled 2018-02-10 (×4): qty 1

## 2018-02-10 MED ORDER — VITAMIN C 500 MG PO TABS
250.0000 mg | ORAL_TABLET | Freq: Two times a day (BID) | ORAL | Status: DC
  Administered 2018-02-10 – 2018-02-12 (×3): 250 mg via ORAL
  Filled 2018-02-10 (×6): qty 0.5

## 2018-02-10 MED ORDER — ISOSORBIDE MONONITRATE ER 30 MG PO TB24: 30.0000 mg | ORAL_TABLET | Freq: Every day | ORAL | Status: DC

## 2018-02-10 MED ORDER — DONEPEZIL HCL 5 MG PO TABS
10.0000 mg | ORAL_TABLET | Freq: Every day | ORAL | Status: DC
  Administered 2018-02-10 – 2018-02-11 (×2): 10 mg via ORAL
  Filled 2018-02-10 (×2): qty 2
  Filled 2018-02-10: qty 1
  Filled 2018-02-10: qty 2

## 2018-02-10 MED ORDER — SODIUM CHLORIDE 0.9 % IV SOLN: 250.0000 mL | INTRAVENOUS | Status: DC | PRN

## 2018-02-10 MED ORDER — ONDANSETRON HCL 4 MG/2ML IJ SOLN
4.0000 mg | Freq: Four times a day (QID) | INTRAMUSCULAR | Status: DC | PRN
  Administered 2018-02-11 – 2018-02-12 (×4): 4 mg via INTRAVENOUS
  Filled 2018-02-10 (×7): qty 2

## 2018-02-10 MED ORDER — ACETAMINOPHEN 650 MG RE SUPP: 650.0000 mg | Freq: Four times a day (QID) | RECTAL | Status: DC | PRN

## 2018-02-10 MED ORDER — SODIUM CHLORIDE 0.9 % IV SOLN
8.0000 mg/h | INTRAVENOUS | Status: DC
  Administered 2018-02-10 – 2018-02-12 (×5): 8 mg/h via INTRAVENOUS
  Filled 2018-02-10 (×5): qty 80

## 2018-02-10 MED ORDER — SODIUM CHLORIDE 0.9% FLUSH: 3.0000 mL | INTRAVENOUS | Status: DC | PRN

## 2018-02-10 MED ORDER — SODIUM CHLORIDE 0.9 % IV SOLN
50.0000 ug/h | INTRAVENOUS | Status: DC
  Administered 2018-02-10 – 2018-02-12 (×5): 50 ug/h via INTRAVENOUS
  Filled 2018-02-10 (×6): qty 1

## 2018-02-10 MED ORDER — SODIUM CHLORIDE 0.9% FLUSH
3.0000 mL | Freq: Two times a day (BID) | INTRAVENOUS | Status: DC
  Administered 2018-02-10 – 2018-02-12 (×3): 3 mL via INTRAVENOUS

## 2018-02-10 MED ORDER — PANTOPRAZOLE SODIUM 40 MG IV SOLR: 40.0000 mg | Freq: Two times a day (BID) | INTRAVENOUS | Status: DC

## 2018-02-10 MED ORDER — LIDOCAINE-PRILOCAINE 2.5-2.5 % EX CREA
1.0000 "application " | TOPICAL_CREAM | Freq: Once | CUTANEOUS | Status: DC
  Filled 2018-02-10: qty 5

## 2018-02-10 MED ORDER — ONDANSETRON HCL 4 MG PO TABS: 4.0000 mg | ORAL_TABLET | Freq: Four times a day (QID) | ORAL | Status: DC | PRN

## 2018-02-10 MED ORDER — MIDODRINE HCL 5 MG PO TABS
10.0000 mg | ORAL_TABLET | Freq: Three times a day (TID) | ORAL | Status: DC
  Administered 2018-02-10 – 2018-02-12 (×6): 10 mg via ORAL
  Filled 2018-02-10 (×8): qty 2

## 2018-02-10 MED ORDER — ACETAMINOPHEN 325 MG PO TABS: 650.0000 mg | ORAL_TABLET | Freq: Four times a day (QID) | ORAL | Status: DC | PRN

## 2018-02-10 MED ORDER — PRAVASTATIN SODIUM 20 MG PO TABS
80.0000 mg | ORAL_TABLET | Freq: Every day | ORAL | Status: DC
Start: 2018-02-10 — End: 2018-02-13
  Administered 2018-02-10: 80 mg via ORAL
  Filled 2018-02-10 (×3): qty 4

## 2018-02-10 MED ORDER — CALCITRIOL 0.25 MCG PO CAPS
0.5000 ug | ORAL_CAPSULE | ORAL | Status: DC
  Administered 2018-02-11: 0.5 ug via ORAL
  Filled 2018-02-10: qty 2
  Filled 2018-02-10: qty 1

## 2018-02-10 MED ORDER — NEPRO/CARBSTEADY PO LIQD
237.0000 mL | Freq: Two times a day (BID) | ORAL | Status: DC
  Administered 2018-02-11: 237 mL via ORAL

## 2018-02-10 MED ORDER — HYDROCODONE-ACETAMINOPHEN 5-325 MG PO TABS
1.0000 | ORAL_TABLET | ORAL | Status: DC | PRN
  Administered 2018-02-12: 1 via ORAL
  Filled 2018-02-10: qty 1

## 2018-02-10 MED ORDER — ADULT MULTIVITAMIN W/MINERALS CH: 1.0000 | ORAL_TABLET | ORAL | Status: DC

## 2018-02-10 MED ORDER — FUROSEMIDE 10 MG/ML IJ SOLN
80.0000 mg | Freq: Two times a day (BID) | INTRAMUSCULAR | Status: DC
  Administered 2018-02-11: 80 mg via INTRAVENOUS
  Filled 2018-02-10 (×2): qty 8

## 2018-02-10 MED ORDER — SUCRALFATE 1 G PO TABS
1.0000 g | ORAL_TABLET | Freq: Three times a day (TID) | ORAL | Status: DC
  Administered 2018-02-10 – 2018-02-12 (×8): 1 g via ORAL
  Filled 2018-02-10 (×10): qty 1

## 2018-02-10 MED ORDER — SODIUM CHLORIDE 0.9 % IV SOLN
80.0000 mg | Freq: Once | INTRAVENOUS | Status: AC
  Administered 2018-02-10: 80 mg via INTRAVENOUS
  Filled 2018-02-10: qty 80

## 2018-02-10 MED ORDER — ADULT MULTIVITAMIN W/MINERALS CH
1.0000 | ORAL_TABLET | Freq: Every day | ORAL | Status: DC
  Administered 2018-02-10: 1 via ORAL
  Filled 2018-02-10: qty 1

## 2018-02-10 NOTE — Progress Notes (Addendum)
patient is admitted this morning for black stool.  According to patient's wife patient noticed black stool yesterday afternoon and then patient was brought to ER.  Decreased p.o. intake, increased sleepiness recently.  Last dialysis was Tuesday patient missed 2 dialysis sessions Thursday, Saturday.  Denies any shortness of breath but has lower extremity edema. Lab data, medications reviewed. Physical exam shows patient is awake, alert, oriented appears pale, cardiovascular system S1, S2 regular Lungs clear to auscultation, no wheeze. GI abdomen soft, nontender, nondistended. Lower extremities has 1+ edema. Spoke with patient's wife at bedside. Acute melena concerning for slow GI bleed with possible PUD or gastritis: Patient takes only aspirin at home so we stopped that, he is on Protonix drip, octreotide drip, patient hemoglobin stable around 7.6, no indication for transfusion at this time, appreciate GI consult. #2 ESRD: Patient missed hemodialysis sessions 2 times, seen by nephrology, scheduled for history today. 3.  History of CAD: Stable. 4.  History of acute chronic systolic heart failure: Patient to get HD today, patient on Tuesday, Thursday, Saturday sessions. 5.  History of multiple myeloma's/p PCI #6 hypertension: Chronic, patient is on Midodrin. Time spent [place]; full  Discussed with patient's wife Unfortunately patient started on diet by admitting physician, appreciate GI following and see if patient needs EGD or not before changing the diet order.

## 2018-02-10 NOTE — ED Provider Notes (Addendum)
[facility] Emergency Department Provider Note   ____________________________________________   First MD Initiated Contact with Patient 02/09/18 2306     (approximate)  I have reviewed the triage vital signs and the nursing notes.   HISTORY  Chief Complaint Rectal Bleeding    HPI Frank Huang is a 84 y.o. male who comes into the hospital today with some black appearing stool.  The patient's wife states that this afternoon when he went to the bathroom it was tarry and black and looked like pudding.  The patient's wife states that this occurred around 18.  She called the nurse at his doctor's office and was told to bring him in.  She reports that for the past 2 weeks he has been very out of it and declining.  She states that he has been very tired and weak.  He has missed his last 2 days of dialysis because of his weakness and fatigue.  The patient's wife states that it was just one bowel movement but she was concerned and brought him in.  The patient denies any abdominal pain fevers or chills.  The patient also denies any shortness of breath or chest pain.  He did not eat much yesterday.  He is here today for evaluation.   Past Medical History:  Diagnosis Date  . Cancer ([place])   . Chronic kidney disease   . COPD (chronic obstructive pulmonary disease) ([place])   . Coronary artery disease   . GERD (gastroesophageal reflux disease)   . Hypertension   . Multiple myeloma ([place])   . Myocardial infarction ([place]) 2005  . Neuropathy   . Shortness of breath dyspnea   . Stroke (cerebrum) (HCC)    weakness Lt hand  . Systolic CHF [facility])     Patient Active Problem List   Diagnosis Date Noted  . GIB (gastrointestinal bleeding) 02/10/2018  . Pain of right scapula 09/26/2017  . Thrombocytopenia ([place]) 03/19/2017  . Pneumonia 01/28/2017  . NSTEMI (non-ST elevated myocardial infarction) ([place]) 11/15/2016  . Chest pain, rule out acute myocardial infarction 11/14/2016  .  Parotid mass 05/17/2016  . Nodule of right lung 05/11/2016  . Persistent atrial fibrillation ([place]) 02/05/2016  . B12 deficiency 07/25/2015  . Deficiency of vitamin B 07/25/2015  . Chest pain 07/01/2015  . Multiple myeloma ([place]) 04/12/2015  . Arteriosclerosis of coronary artery 04/09/2015  . CAFL (chronic airflow limitation) ([place]) 04/09/2015  . Chronic kidney disease requiring chronic dialysis ([place]) 04/09/2015  . Gastro-esophageal reflux disease without esophagitis 04/09/2015  . Gout 04/09/2015  . H/O acute myocardial infarction 04/09/2015  . HLD (hyperlipidemia) 04/09/2015  . BP (high blood pressure) 04/09/2015  . Bad memory 04/09/2015  . 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 Kidney failure 04/09/2015  . End-stage renal disease ([place]) 04/09/2015  . Chronic kidney disease (CKD), stage V ([place]) 07/20/2013  . Chronic kidney disease, stage V ([place]) 07/20/2013  . Absolute anemia 03/31/2013  . Neuropathy 03/31/2013    Past Surgical History:  Procedure Laterality Date  . APPENDECTOMY    . AV FISTULA PLACEMENT Left   . CARDIAC CATHETERIZATION N/A 11/15/2016   Procedure: Left Heart Cath and Coronary Angiography;  Surgeon: Isaias Cowman, MD;  Location: [place] [facility];  Service: Cardiovascular;  Laterality: N/A;  . CHOLECYSTECTOMY    . LEFT HEART CATH AND CORONARY ANGIOGRAPHY N/A 12/17/2017   Procedure: LEFT HEART CATH AND CORONARY ANGIOGRAPHY and possible PCI and stent;  Surgeon: Clayborn Bigness,  Loran Senters, MD;  Location: [place] [facility];  Service: Cardiovascular;  Laterality: N/A;  . SHOULDER ARTHROSCOPY WITH OPEN ROTATOR CUFF REPAIR Left 09/19/2015   Procedure: SHOULDER ARTHROSCOPY , subacromial decompression, debridement;  Surgeon: Corky Mull, MD;  Location: [facility] ORS;  Service: Orthopedics;  Laterality: Left;  . TOTAL HIP ARTHROPLASTY Left     Prior to Admission medications   Medication Sig Start Date End Date Taking? Authorizing Provider    aspirin EC 81 MG tablet Take 81 mg by mouth daily.   Yes [provider]  calcitRIOL (ROCALTROL) 0.5 MCG capsule Take 0.5 mcg by mouth 3 (three) times a week.   Yes [provider]  clopidogrel (PLAVIX) 75 MG tablet Take 1 tablet (75 mg total) by mouth daily. 12/17/17  Yes Epifanio Lesches, MD  donepezil (ARICEPT) 10 MG tablet TAKE 1 TABLET BY MOUTH AT  BEDTIME 02/17/17  Yes [name]., MD  furosemide (LASIX) 80 MG tablet Take 1 tablet (80 mg total) by mouth every Tuesday, Thursday, Saturday, and Sunday. 02/01/17  Yes Bettey Costa, MD  isosorbide mononitrate (IMDUR) 30 MG 24 hr tablet Take 1 tablet (30 mg total) by mouth daily. 12/17/17  Yes Epifanio Lesches, MD  lidocaine-prilocaine (EMLA) cream Apply 1 application topically daily as needed.  04/18/16  Yes [provider]  lovastatin (MEVACOR) 40 MG tablet TAKE 1 TABLET BY MOUTH EVERY DAY FOR HIGH CHOLESTEROL 08/10/14  Yes [provider]  metoprolol tartrate (LOPRESSOR) 25 MG tablet Take 1 tablet (25 mg total) by mouth 2 (two) times daily. 12/17/17  Yes Epifanio Lesches, MD  Multiple Vitamin (MULTI-VITAMINS) TABS Take 1 tablet by mouth daily. Reported on 04/01/2016   Yes [provider]  omeprazole (PRILOSEC) 20 MG capsule TAKE 1 CAPSULE BY MOUTH  DAILY. 11/05/16  Yes [name]., MD  RENVELA 800 MG tablet Take 1,600 mg by mouth 3 (three) times daily.  11/05/16  Yes [provider]  sucralfate (CARAFATE) 1 g tablet Take 1 tablet (1 g total) by mouth 4 (four) times daily -  before meals and at bedtime. 07/17/17  Yes [name]., MD  traMADol (ULTRAM) 50 MG tablet Take 1 tablet (50 mg total) every 12 (twelve) hours as needed by mouth. 09/26/17  Yes Bacigalupo, Dionne Bucy, MD    Allergies Patient has no known allergies.  Family History  Problem Relation Age of Onset  . Alzheimer's disease Mother   . Kidney failure Father   . Bone cancer Sister   . Stomach  cancer Sister     Social History Social History   Tobacco Use  . Smoking status: Current Every Day Smoker    Packs/day: 0.50    Types: Cigarettes  . Smokeless tobacco: Never Used  Substance Use Topics  . Alcohol use: No    Alcohol/week: 0.0 oz  . Drug use: No    Review of Systems  Constitutional: Fatigue Eyes: No visual changes. ENT: No sore throat. Cardiovascular: Denies chest pain. Respiratory: Denies shortness of breath. Gastrointestinal: No abdominal pain.  No nausea, no vomiting.  Patient with black stools Genitourinary: Negative for dysuria. Musculoskeletal: Negative for back pain. Skin: Negative for rash. Neurological: Generalized weakness   ____________________________________________   PHYSICAL EXAM:  VITAL SIGNS: ED Triage Vitals  Enc Vitals Group     BP 02/09/18 2041 129/63     Pulse Rate 02/09/18 2041 83     Resp 02/09/18 2041 16     Temp 02/09/18  2041 98.1 F (36.7 C)     Temp Source 02/09/18 2041 Oral     SpO2 02/09/18 2041 98 %     Weight 02/09/18 2045 194 lb (88 kg)     Height 02/09/18 2045 _0  (1.905 m)     Head Circumference --      Peak Flow --      Pain Score 02/09/18 2045 0     Pain Loc --      Pain Edu? --      Excl. in [place]? --     Constitutional: Alert and oriented.  Tired appearing and in no acute distress. Eyes: Conjunctivae are normal. PERRL. EOMI. Head: Atraumatic. Nose: No congestion/rhinnorhea. Mouth/Throat: Mucous membranes are moist.  Oropharynx non-erythematous. Cardiovascular: Irregularly irregular rhythm. Grossly normal heart sounds.  Good peripheral circulation. Respiratory: Normal respiratory effort.  No retractions. Lungs CTAB. Gastrointestinal: Soft and nontender. No distention.  Positive bowel sounds Rectal: Black appearing stool that is heme positive on Hemoccult  Musculoskeletal: bilateral pitting edema.   Neurologic:  Normal speech and language.  Skin:  Skin is warm, dry and intact.  Psychiatric: Mood and  affect are normal.   ____________________________________________   LABS (all labs ordered are listed, but only abnormal results are displayed)  Labs Reviewed  COMPREHENSIVE METABOLIC PANEL - Abnormal; Notable for the following components:      Result Value   Chloride 97 (*)    Glucose, Bld 112 (*)    BUN 88 (*)    Creatinine, Ser 8.56 (*)    Albumin 2.9 (*)    ALT 10 (*)    Total Bilirubin 1.4 (*)    GFR calc non Af Amer 5 (*)    GFR calc Af Amer 6 (*)    Anion gap 16 (*)    All other components within normal limits  CBC - Abnormal; Notable for the following components:   RBC 2.58 (*)    Hemoglobin 8.2 (*)    HCT 25.2 (*)    RDW 17.4 (*)    Platelets 146 (*)    All other components within normal limits  PROTIME-INR  LACTIC ACID, PLASMA  TROPONIN I  URINALYSIS, COMPLETE (UACMP) WITH MICROSCOPIC  LACTIC ACID, PLASMA  HEMOGLOBIN AND HEMATOCRIT, BLOOD  HEMOGLOBIN AND HEMATOCRIT, BLOOD  HEMOGLOBIN AND HEMATOCRIT, BLOOD  POC OCCULT BLOOD, ED  TYPE AND SCREEN   ____________________________________________  EKG  ED ECG REPORT I, Loney Hering, the attending physician, personally viewed and interpreted this ECG.   Date: 02/09/2018  EKG Time: 2322  Rate: 86  Rhythm: atrial fibrillation, rate 86  Axis: left axis deviation  Intervals:nonspecific intraventricular conduction delay  ST&T Change: ST segment depression in lead V5 and V6 seen on EKG from 12/15/17  ____________________________________________  RADIOLOGY  ED MD interpretation:  none  Official radiology report(s): No results found.  ____________________________________________   PROCEDURES  Procedure(s) performed: None  Procedures  Critical Care performed: No  ____________________________________________   INITIAL IMPRESSION / ASSESSMENT AND PLAN / ED COURSE  As part of my medical decision making, I reviewed the following data within the electronic MEDICAL RECORD NUMBER Notes from prior ED  visits and [place] Controlled Substance Database  This is an 84 year old male who comes into the hospital today with some dark appearing stool and generalized weakness.  My differential diagnosis includes GI bleed, ACS, urinary tract infection.  We did check some blood work on the patient.  His CBC showed a hemoglobin of 8.2 and 25.2  which is at the patient's baseline.  His CMP showed an elevated creatinine and BUN but that would be expected with the patient's renal failure.  Since his stool is heme positive and he is having weakness with the inability to attend dialysis I will admit the patient for further evaluation.  His INR is 1.17.  He will be admitted.      ____________________________________________   FINAL CLINICAL IMPRESSION(S) / ED DIAGNOSES  Final diagnoses:  Weakness  Fatigue, unspecified type  Gastrointestinal hemorrhage with melena     ED Discharge Orders    None       Note:  This document was prepared using Dragon voice recognition software and may include unintentional dictation errors.    Loney Hering, MD 02/10/18 0248    Loney Hering, MD 02/10/18 [phone]

## 2018-02-10 NOTE — Progress Notes (Signed)
Post HD assessment. Condition unchanged.

## 2018-02-10 NOTE — Progress Notes (Signed)
Family Meeting Note  Advance Directive:yes  Today a meeting took place with the Patient and wife.  Patient is able to participate   The following clinical team members were present during this meeting:MD  The following were discussed:Patient's diagnosis: , Patient's progosis: Unable to determine and Goals for treatment: Full Code  Additional follow-up to be provided: prn  Time spent during discussion:20 minutes  Gorden Harms, MD

## 2018-02-10 NOTE — H&P (Signed)
[facility] [place] NAME: Frank Huang    MR#:  [id number]  DATE OF BIRTH:  January 02, 1932  DATE OF ADMISSION:  02/09/2018  PRIMARY CARE PHYSICIAN: [name]., MD   REQUESTING/REFERRING PHYSICIAN:   CHIEF COMPLAINT:   Chief Complaint  Patient presents with  . Rectal Bleeding    HISTORY OF PRESENT ILLNESS: Frank Huang  is a 84 y.o. male with a known history per below, brought in to the emergency room for large black tarry stool that occurred at 4:30 PM on yesterday, patient also missed hemodialysis for the last 1 week, last hemodialysis was on Tuesday, patient states that he just did not feel like going, also with complaints of swelling in his legs, increased sleepiness, decreased p.o. intake, decreased activity with dizziness, in the emergency room patient was found to have hemoglobin 8.2, creatinine 8.5, total bili 1.4, EKG noted for A. fib with heart rate of 86, patient in no apparent distress, wife at the bedside, patient is now been admitted for acute melena, end-stage renal disease-noncompliant with hemodialysis for 1 week, and acute on chronic systolic congestive heart failure exacerbation.  PAST MEDICAL HISTORY:   Past Medical History:  Diagnosis Date  . Cancer ([place])   . Chronic kidney disease   . COPD (chronic obstructive pulmonary disease) ([place])   . Coronary artery disease   . GERD (gastroesophageal reflux disease)   . Hypertension   . Multiple myeloma ([place])   . Myocardial infarction ([place]) 2005  . Neuropathy   . Shortness of breath dyspnea   . Stroke (cerebrum) (H[facility])    weakness Lt hand  . Systolic CHF ([place])     PAST SURGICAL HISTORY:  Past Surgical History:  Procedure Laterality Date  . APPENDECTOMY    . AV FISTULA PLACEMENT Left   . CARDIAC CATHETERIZATION N/A 11/15/2016   Procedure: Left Heart Cath and Coronary Angiography;  Surgeon: Isaias Cowman, MD;  Location: [place] [facility];  Service: Cardiovascular;   Laterality: N/A;  . CHOLECYSTECTOMY    . LEFT HEART CATH AND CORONARY ANGIOGRAPHY N/A 12/17/2017   Procedure: LEFT HEART CATH AND CORONARY ANGIOGRAPHY and possible PCI and stent;  Surgeon: Yolonda Kida, MD;  Location: [facility];  Service: Cardiovascular;  Laterality: N/A;  . SHOULDER ARTHROSCOPY WITH OPEN ROTATOR CUFF REPAIR Left 09/19/2015   Procedure: SHOULDER ARTHROSCOPY , subacromial decompression, debridement;  Surgeon: Corky Mull, MD;  Location: [facility] ORS;  Service: Orthopedics;  Laterality: Left;  . TOTAL HIP ARTHROPLASTY Left     SOCIAL HISTORY:  Social History   Tobacco Use  . Smoking status: Current Every Day Smoker    Packs/day: 0.50    Types: Cigarettes  . Smokeless tobacco: Never Used  Substance Use Topics  . Alcohol use: No    Alcohol/week: 0.0 oz    FAMILY HISTORY:  Family History  Problem Relation Age of Onset  . Alzheimer's disease Mother   . Kidney failure Father   . Bone cancer Sister   . Stomach cancer Sister     DRUG ALLERGIES: No Known Allergies  REVIEW OF SYSTEMS:   CONSTITUTIONAL: No fever,+ fatigue / weakness.  EYES: No blurred or double vision.  EARS, NOSE, AND THROAT: No tinnitus or ear pain.  RESPIRATORY: No cough, shortness of breath, wheezing or hemoptysis.  CARDIOVASCULAR: No chest pain, orthopnea,+ edema.  GASTROINTESTINAL: No nausea, vomiting, diarrhea or abdominal pain.  Black tarry stool GENITOURINARY: No dysuria, hematuria.  ENDOCRINE: No polyuria, nocturia,  HEMATOLOGY: No anemia, easy bruising or bleeding SKIN: No rash or lesion. MUSCULOSKELETAL: No joint pain or arthritis.   NEUROLOGIC: No tingling, numbness, weakness.  Lethargy, increased sleepiness PSYCHIATRY: No anxiety or depression.   MEDICATIONS AT HOME:  Prior to Admission medications   Medication Sig Start Date End Date Taking? Authorizing Provider  aspirin EC 81 MG tablet Take 81 mg by mouth daily.   Yes [provider]  calcitRIOL (ROCALTROL)  0.5 MCG capsule Take 0.5 mcg by mouth 3 (three) times a week.   Yes [provider]  clopidogrel (PLAVIX) 75 MG tablet Take 1 tablet (75 mg total) by mouth daily. 12/17/17  Yes Epifanio Lesches, MD  donepezil (ARICEPT) 10 MG tablet TAKE 1 TABLET BY MOUTH AT  BEDTIME 02/17/17  Yes [name]., MD  furosemide (LASIX) 80 MG tablet Take 1 tablet (80 mg total) by mouth every Tuesday, Thursday, Saturday, and Sunday. 02/01/17  Yes Bettey Costa, MD  isosorbide mononitrate (IMDUR) 30 MG 24 hr tablet Take 1 tablet (30 mg total) by mouth daily. 12/17/17  Yes Epifanio Lesches, MD  lidocaine-prilocaine (EMLA) cream Apply 1 application topically daily as needed.  04/18/16  Yes [provider]  lovastatin (MEVACOR) 40 MG tablet TAKE 1 TABLET BY MOUTH EVERY DAY FOR HIGH CHOLESTEROL 08/10/14  Yes [provider]  metoprolol tartrate (LOPRESSOR) 25 MG tablet Take 1 tablet (25 mg total) by mouth 2 (two) times daily. 12/17/17  Yes Epifanio Lesches, MD  Multiple Vitamin (MULTI-VITAMINS) TABS Take 1 tablet by mouth daily. Reported on 04/01/2016   Yes [provider]  omeprazole (PRILOSEC) 20 MG capsule TAKE 1 CAPSULE BY MOUTH  DAILY. 11/05/16  Yes [name]., MD  RENVELA 800 MG tablet Take 1,600 mg by mouth 3 (three) times daily.  11/05/16  Yes [provider]  sucralfate (CARAFATE) 1 g tablet Take 1 tablet (1 g total) by mouth 4 (four) times daily -  before meals and at bedtime. 07/17/17  Yes [name]., MD  traMADol (ULTRAM) 50 MG tablet Take 1 tablet (50 mg total) every 12 (twelve) hours as needed by mouth. 09/26/17  Yes Bacigalupo, Dionne Bucy, MD      PHYSICAL EXAMINATION:   VITAL SIGNS: Blood pressure (!) 107/55, pulse 71, temperature 98.1 F (36.7 C), temperature source Oral, resp. rate (!) 21, height _0  (1.905 m), weight 88 kg (194 lb), SpO2 99 %.  GENERAL:  84 y.o.-year-old patient lying in the bed with no acute distress.   Frail-appearing EYES: Pupils equal, round, reactive to light and accommodation. No scleral icterus. Extraocular muscles intact.  HEENT: Head atraumatic, normocephalic. Oropharynx and nasopharynx clear.  NECK:  Supple, no jugular venous distention. No thyroid enlargement, no tenderness.  LUNGS: Normal breath sounds bilaterally, no wheezing, rales,rhonchi or crepitation. No use of accessory muscles of respiration.  CARDIOVASCULAR: S1, S2 normal. No murmurs, rubs, or gallops.  ABDOMEN: Soft, nontender, nondistended. Bowel sounds present. No organomegaly or mass.  EXTREMITIES: Bilateral lower extremity pitting , no cyanosis, or clubbing.  NEUROLOGIC: Cranial nerves II through XII are intact. Muscle strength 5/5 in all extremities. Sensation intact. Gait not checked.  PSYCHIATRIC: The patient has mild lethargy, oriented x 3.  SKIN: No obvious rash, lesion, or ulcer.   LABORATORY PANEL:   CBC Recent Labs  Lab 02/09/18 2053  WBC 5.8  HGB 8.2*  HCT 25.2*  PLT 146*  MCV 97.6  MCH 31.9  MCHC 32.7  RDW 17.4*   ------------------------------------------------------------------------------------------------------------------  Chemistries  Recent Labs  Lab 02/09/18 2053  NA 138  K 4.7  CL 97*  CO2 25  GLUCOSE 112*  BUN 88*  CREATININE 8.56*  CALCIUM 9.3  AST 19  ALT 10*  ALKPHOS 86  BILITOT 1.4*   ------------------------------------------------------------------------------------------------------------------ estimated creatinine clearance is 7.8 mL/min (A) (by C-G formula based on SCr of 8.56 mg/dL (H)). ------------------------------------------------------------------------------------------------------------------ No results for input(s): TSH, T4TOTAL, T3FREE, THYROIDAB in the last 72 hours.  Invalid input(s): FREET3   Coagulation profile Recent Labs  Lab 02/09/18 2053  INR 1.17    ------------------------------------------------------------------------------------------------------------------- No results for input(s): DDIMER in the last 72 hours. -------------------------------------------------------------------------------------------------------------------  Cardiac Enzymes No results for input(s): CKMB, TROPONINI, MYOGLOBIN in the last 168 hours.  Invalid input(s): CK ------------------------------------------------------------------------------------------------------------------ Invalid input(s): POCBNP  ---------------------------------------------------------------------------------------------------------------  Urinalysis    Component Value Date/Time   COLORURINE Yellow 12/12/2013 1501   APPEARANCEUR Cloudy 12/12/2013 1501   LABSPEC 1.014 12/12/2013 1501   PHURINE 5.0 12/12/2013 1501   GLUCOSEU Negative 12/12/2013 1501   HGBUR 2+ 12/12/2013 1501   BILIRUBINUR Negative 12/12/2013 1501   KETONESUR Negative 12/12/2013 1501   PROTEINUR 100 mg/dL 12/12/2013 1501   NITRITE Negative 12/12/2013 1501   LEUKOCYTESUR 2+ 12/12/2013 1501     RADIOLOGY: No results found.  EKG: Orders placed or performed during the hospital encounter of 02/09/18  . EKG 12-Lead  . EKG 12-Lead    IMPRESSION AND PLAN: 1 acute melena/GI bleeding Regular nursing floor, hold antiplatelet agents, gastroenterology for expert opinion, H&H every 8 hours, CBC daily, transfuse as needed, Protonix/octreotide drips, guaiac all stools  2 chronic end-stage renal disease Noncompliant with hemodialysis for last week-last treatment was last Tuesday Typically receives dialysis on Tuesdays/Thursdays/Saturdays Nephrology consulted for hemodialysis needs, IV Lasix twice daily, strict I&O monitoring, daily weights  3 acute on chronic systolic congestive heart failure exacerbation Exacerbated by fluid overload state from end-stage renal disease/noncompliance Avoid anticoagulants due  to GI bleeding, IV Lasix twice daily, strict I&O monitoring, daily weights, continue statin therapy, beta-blocker therapy on hold for relative hypotension  4 chronic benign essential hypertension Currently with relatively low systolic blood pressures Antihypertensives on hold, vitals per routine, make changes as per necessary  5 acute hypotension Start Midodrine 3 times daily  All the records are reviewed and case discussed with ED provider. Management plans discussed with the patient, family and they are in agreement.  CODE STATUS:full Code Status History    Date Active Date Inactive Code Status Order ID Comments User Context   12/15/2017 1151 12/17/2017 2149 Full Code [id number]  Loletha Grayer, MD ED   01/28/2017 0757 01/30/2017 1753 Full Code [id number]  Saundra Shelling, MD Inpatient   11/15/2016 0230 11/16/2016 1727 Full Code [id number]  Harvie Bridge, DO Inpatient   06/18/2016 1109 06/19/2016 0515 Full Code [id number]  Sabino Dick, MD HOV   09/19/2015 1401 09/19/2015 1918 Full Code [id number]  Corky Mull, MD Inpatient   07/01/2015 0754 07/01/2015 1559 Full Code [id number]  Loletha Grayer, MD ED       TOTAL TIME TAKING CARE OF THIS PATIENT: 45 minutes.    Frank Huang M.D on 02/10/2018   Between 7am to 6pm - Pager - [phone]  After 6pm go to [email or web address] - password [email or web address] [place] [facility]  Office  [phone]  CC: Primary care physician; [name]., MD   Note: This dictation was prepared with Dragon dictation along with smaller phrase technology. Any transcriptional errors  that result from this process are unintentional.

## 2018-02-10 NOTE — Progress Notes (Addendum)
Initial Nutrition Assessment  DOCUMENTATION CODES:   Not applicable  INTERVENTION:   Nepro Shake po BID, each supplement provides 425 kcal and 19 grams protein  MVI 3 times weekly  Rena-vite daily  Ascorbic acid lab pending   Vitamin C 246m po BID  NUTRITION DIAGNOSIS:   Increased nutrient needs related to chronic illness(ESRD on HD) as evidenced by increased estimated needs from protein.  GOAL:   Patient will meet greater than or equal to 90% of their needs  MONITOR:   PO intake, Supplement acceptance, Weight trends, Labs, Skin, I & O's  REASON FOR ASSESSMENT:   Malnutrition Screening Tool    ASSESSMENT:   84y/o male with h/o ESRD on HD, CHF, admitted with CHF exacerbation and GIB   Met with pt in room today. Pt reports good appetite and oral intake at baseline. Pt reports that as he has gotten older, he has started to eat more small, frequent meals. Pt does like vanilla Nepro but doesn't drink it regularly. Per chart, pt has lost 10lbs(5%) over the past 2 weeks; unsure of true weight loss r/t fluid changes. Pt reports that he has been on dialysis for 7 years. Pt noted to have bruising on his bilateral arms. Pt reports that he does not take rena-vite or any MVI at home. Pt at high risk for scurvy r/t his chronic HD. RD will order supplements. Per Nephrology, will check ascorbic acid lab. Pt ate 100% of his breakfast today which included toast with jelly and a hard boiled egg. Pt also drank a Nepro while RD was in room.    Medications reviewed and include: calcitriol, lasix, MVI, protonix, renvela, sucralfate, octroetide, protonix   Labs reviewed: K 4.5 wnl, Cl 100(L), BUN 87(H), creat 8.8(H) P 5.5(H)- 2/6 Hgb 7.6(L), Hct 23.4(L)  Nutrition-Focused physical exam completed. Findings are mild fat and muscle depletions, and severe edema.   Diet Order:  Diet 2 gram sodium Room service appropriate? Yes; Fluid consistency: Thin  EDUCATION NEEDS:   Education needs have  been addressed  Skin:  Skin Assessment: Reviewed RN Assessment(ecchymosis )  Last BM:  4/1- dark tar-like stool   Height:   Ht Readings from Last 1 Encounters:  02/09/18 6' 3"  (1.905 m)    Weight:   Wt Readings from Last 1 Encounters:  02/10/18 180 lb 4.8 oz (81.8 kg)    Ideal Body Weight:  89 kg  BMI:  Body mass index is 22.54 kg/m.  Estimated Nutritional Needs:   Kcal:  2000-2300kcal/day   Protein:  106-122g/day   Fluid:  >2L/day or per MD  CKoleen DistanceMS, RD, LDN Pager #[phone] Hours Pager: [phone]

## 2018-02-10 NOTE — Progress Notes (Signed)
Hemodialysis- Patient decided to end treatment due to back pain with 17 minutes remaining. Tolerated procedure well. Total UF 838mL without issue. Report called to primary RN.

## 2018-02-10 NOTE — Care Management (Signed)
Admitted with rectal bleeding. ESRD, notified [name] with [facility]. Missed two dialysis treatments because "I just did not feel like getting out of bed."

## 2018-02-10 NOTE — Consult Note (Signed)
Patient was and hemodialysis all day and will be seen tomorrow.

## 2018-02-10 NOTE — Progress Notes (Signed)
Held lasix this morning due to low BP per Dr. Vianne Bulls. MD aware of Hgb 7.6, no orders at this time. Will continue to monitor.

## 2018-02-10 NOTE — Progress Notes (Signed)
[facility]  ROUNDING NOTE   Subjective:   Seen and examined on hemodialysis. Tolerating treatment well.   Missed Saturday treatment due to weakness.   Having rectal bleeding.     HEMODIALYSIS FLOWSHEET:  Blood Flow Rate (mL/min): 400 mL/min Arterial Pressure (mmHg): -150 mmHg Venous Pressure (mmHg): 220 mmHg Transmembrane Pressure (mmHg): 70 mmHg Ultrafiltration Rate (mL/min): 500 mL/min Dialysate Flow Rate (mL/min): 600 ml/min Conductivity: Machine : 51.4 Conductivity: Machine : 51.4 Dialysis Fluid Bolus: Normal Saline Bolus Amount (mL): 250 mL    Objective:  Vital signs in last 24 hours:  Temp:  [97.5 F (36.4 C)-98.4 F (36.9 C)] 98.2 F (36.8 C) (04/02 1409) Pulse Rate:  [71-87] 81 (04/02 1409) Resp:  [15-25] 23 (04/02 1530) BP: (99-129)/(55-72) 121/68 (04/02 1530) SpO2:  [89 %-100 %] 100 % (04/02 1530) Weight:  [81.7 kg (180 lb 1.9 oz)-88 kg (194 lb)] 81.7 kg (180 lb 1.9 oz) (04/02 1409)  Weight change:  Filed Weights   02/09/18 2045 02/10/18 0344 02/10/18 1409  Weight: 88 kg (194 lb) 81.8 kg (180 lb 4.8 oz) 81.7 kg (180 lb 1.9 oz)    Intake/Output: No intake/output data recorded.   Intake/Output this shift:  Total I/O In: 240 [P.O.:240] Out: -   Physical Exam: General: NAD,   Head: Normocephalic, atraumatic. Moist oral mucosal membranes  Eyes: Anicteric, PERRL  Neck: Supple, trachea midline  Lungs:  Clear to auscultation  Heart: Regular rate and rhythm  Abdomen:  Soft, nontender,   Extremities: no peripheral edema.  Neurologic: Nonfocal, moving all four extremities  Skin: No lesions  Access: Left AVF    Basic Metabolic Panel: Recent Labs  Lab 02/09/18 2053 02/10/18 0516  NA 138 140  K 4.7 4.5  CL 97* 100*  CO2 25 27  GLUCOSE 112* 96  BUN 88* 87*  CREATININE 8.56* 8.80*  CALCIUM 9.3 9.1    Liver Function Tests: Recent Labs  Lab 02/09/18 2053  AST 19  ALT 10*  ALKPHOS 86  BILITOT 1.4*  PROT 7.3  ALBUMIN 2.9*    No results for input(s): LIPASE, AMYLASE in the last 168 hours. No results for input(s): AMMONIA in the last 168 hours.  CBC: Recent Labs  Lab 02/09/18 2053 02/10/18 0516 02/10/18 1447  WBC 5.8 4.4  --   HGB 8.2* 7.6* 7.4*  HCT 25.2* 23.4* 23.5*  MCV 97.6 97.7  --   PLT 146* 131*  --     Cardiac Enzymes: Recent Labs  Lab 02/10/18 0100  TROPONINI 0.05*    BNP: Invalid input(s): POCBNP  CBG: No results for input(s): GLUCAP in the last 168 hours.  Microbiology: Results for orders placed or performed in visit on 01/26/18  Urine Culture     Status: Abnormal   Collection Time: 01/27/18 10:30 AM  Result Value Ref Range Status   Urine Culture, Routine Final report (A)  Final   Organism ID, Bacteria Comment (A)  Final    Comment: Staphylococcus epidermidis Greater than 100,000 colony forming units per mL Based on susceptibility to oxacillin this isolate would be susceptible to: *Penicillinase-stable penicillins, such as:   Cloxacillin, Dicloxacillin, Nafcillin *Beta-lactam combination agents, such as:   Amoxicillin-clavulanic acid, Ampicillin-sulbactam,   Piperacillin-tazobactam *Oral cephems, such as:   Cefaclor, Cefdinir, Cefpodoxime, Cefprozil, Cefuroxime,   Cephalexin, Loracarbef *Parenteral cephems, such as:   Cefazolin, Cefepime, Cefotaxime, Cefotetan, Ceftaroline,   Ceftizoxime, Ceftriaxone, Cefuroxime *Carbapenems, such as:   Doripenem, Ertapenem, Imipenem, Meropenem    Antimicrobial Susceptibility Comment  Final    Comment:       ** S = Susceptible; I = Intermediate; R = Resistant **                    P = Positive; N = Negative             MICS are expressed in micrograms per mL    Antibiotic                 RSLT#1    RSLT#2    RSLT#3    RSLT#4 Ciprofloxacin                  R Gentamicin                     S Levofloxacin                   I Linezolid                      S Nitrofurantoin                 S Oxacillin                       S Penicillin                     R Quinupristin/Dalfopristin      S Rifampin                       S Tetracycline                   S Trimethoprim/Sulfa             R Vancomycin                     S     Coagulation Studies: Recent Labs    02/09/18 December 24, 2051  LABPROT 14.8  INR 1.17    Urinalysis: Recent Labs    02/10/18 0048  COLORURINE YELLOW*  LABSPEC 1.012  PHURINE 6.0  GLUCOSEU NEGATIVE  HGBUR SMALL*  BILIRUBINUR NEGATIVE  KETONESUR NEGATIVE  PROTEINUR 100*  NITRITE NEGATIVE  LEUKOCYTESUR MODERATE*      Imaging: No results found.   Medications:   . sodium chloride    . octreotide  (SANDOSTATIN)    IV infusion 50 mcg/hr (02/10/18 0503)  . pantoprozole (PROTONIX) infusion 8 mg/hr (02/10/18 0501)   . [START ON 02/11/2018] calcitRIOL  0.5 mcg Oral Once per day on Mon Wed Fri  . donepezil  10 mg Oral QHS  . feeding supplement (NEPRO CARB STEADY)  237 mL Oral BID BM  . furosemide  80 mg Intravenous BID  . isosorbide mononitrate  30 mg Oral Daily  . lidocaine-prilocaine  1 application Topical Once  . midodrine  10 mg Oral TID WC  . multivitamin  1 tablet Oral QHS  . [START ON 02/13/2018] multivitamin with minerals  1 tablet Oral Once per day on 2022/12/23 Fri  . [START ON 02/13/2018] pantoprazole  40 mg Intravenous Q12H  . pravastatin  80 mg Oral q1800  . sevelamer carbonate  1,600 mg Oral TID  . sodium chloride flush  3 mL Intravenous Q12H  . sucralfate  1 g Oral TID AC & HS  . vitamin C  250 mg Oral BID   sodium chloride, acetaminophen **OR**  acetaminophen, HYDROcodone-acetaminophen, ondansetron **OR** ondansetron (ZOFRAN) IV, sodium chloride flush  Assessment/ Plan:  Frank Huang is a 84 y.o. white male end stage renal disease on hemodialysis, multiple myeloma, coronary artery disease, atrial fibrillation  [facility]/ TTS  1.  End-stage renal disease: missed Saturday treatment. Seen and examined on hemodialysis. Tolerating treatment well.    2.  Anemia of chronic kidney disease: with GI bleed. Hemoglobin 7.4 with multiple myeloma - Appreciate GI input.   3.  Secondary hyperparathyroidism - calcitriol - sevelamer  4.  Hypertension: blood pressure at goal.      LOS: 0   4/2/20194:07 PM

## 2018-02-11 ENCOUNTER — Inpatient Hospital Stay: Payer: Medicare Other

## 2018-02-11 LAB — HEMOGLOBIN AND HEMATOCRIT, BLOOD
HEMATOCRIT: 24.8 % — AB (ref 40.0–52.0)
HEMOGLOBIN: 7.9 g/dL — AB (ref 13.0–18.0)

## 2018-02-11 MED ORDER — TAMSULOSIN HCL 0.4 MG PO CAPS
0.4000 mg | ORAL_CAPSULE | Freq: Every day | ORAL | Status: DC
  Administered 2018-02-11 – 2018-02-12 (×2): 0.4 mg via ORAL
  Filled 2018-02-11 (×2): qty 1

## 2018-02-11 NOTE — Consult Note (Signed)
Lucilla Lame, MD [facility]  [address], [place], [place] [address] Phone: [phone] Fax : [phone]  Consultation  Referring Provider:     Dr. Dossie Der Primary Care Physician:  [name]., MD Primary Gastroenterologist:  Dr. Vira Agar         Reason for Consultation:     Melena  Date of Admission:  02/09/2018 Date of Consultation:  02/11/2018         HPI:   Frank Huang is a 84 y.o. male who was admitted with a report of large black tarry stools that occurred the day prior to admission.  The patient is on hemodialysis and has been having leg swelling.  The patient has had a stable hemoglobin since admission.  The patient's hemoglobin on admission was 8.2 and is now 7.9.  The patient has not had any further sign of GI bleeding.  He denies any abdominal pain but states he takes an aspirin but avoids Advil Aleve Motrin RBCs.  The patient's baseline hemoglobin has been as high as 11.3 in November 2018.  I am now being asked to see the patient for his melena.  He denies any abdominal pain.  Past Medical History:  Diagnosis Date  . Cancer ([place])   . Chronic kidney disease   . COPD (chronic obstructive pulmonary disease) ([place])   . Coronary artery disease   . GERD (gastroesophageal reflux disease)   . Hypertension   . Multiple myeloma ([place])   . Myocardial infarction ([place]) 2005  . Neuropathy   . Shortness of breath dyspnea   . Stroke (cerebrum) ([facility])    weakness Lt hand  . Systolic CHF [facility])     Past Surgical History:  Procedure Laterality Date  . APPENDECTOMY    . AV FISTULA PLACEMENT Left   . CARDIAC CATHETERIZATION N/A 11/15/2016   Procedure: Left Heart Cath and Coronary Angiography;  Surgeon: Isaias Cowman, MD;  Location: [facility];  Service: Cardiovascular;  Laterality: N/A;  . CHOLECYSTECTOMY    . LEFT HEART CATH AND CORONARY ANGIOGRAPHY N/A 12/17/2017   Procedure: LEFT HEART CATH AND CORONARY ANGIOGRAPHY and possible PCI and stent;  Surgeon:  Yolonda Kida, MD;  Location: [place] [facility];  Service: Cardiovascular;  Laterality: N/A;  . SHOULDER ARTHROSCOPY WITH OPEN ROTATOR CUFF REPAIR Left 09/19/2015   Procedure: SHOULDER ARTHROSCOPY , subacromial decompression, debridement;  Surgeon: Corky Mull, MD;  Location: [facility] OR[facility];  Service: Orthopedics;  Laterality: Left;  . TOTAL HIP ARTHROPLASTY Left     Prior to Admission medications   Medication Sig Start Date End Date Taking? Authorizing Provider  aspirin EC 81 MG tablet Take 81 mg by mouth daily.   Yes [provider]  calcitRIOL (ROCALTROL) 0.5 MCG capsule Take 0.5 mcg by mouth 3 (three) times a week.   Yes [provider]  clopidogrel (PLAVIX) 75 MG tablet Take 1 tablet (75 mg total) by mouth daily. 12/17/17  Yes Epifanio Lesches, MD  donepezil (ARICEPT) 10 MG tablet TAKE 1 TABLET BY MOUTH AT  BEDTIME 02/17/17  Yes [name]., MD  furosemide (LASIX) 80 MG tablet Take 1 tablet (80 mg total) by mouth every Tuesday, Thursday, Saturday, and Sunday. 02/01/17  Yes Bettey Costa, MD  isosorbide mononitrate (IMDUR) 30 MG 24 hr tablet Take 1 tablet (30 mg total) by mouth daily. 12/17/17  Yes Epifanio Lesches, MD  lidocaine-prilocaine (EMLA) cream Apply 1 application topically daily as needed.  04/18/16  Yes [provider]  lovastatin (MEVACOR) 40 MG tablet TAKE 1 TABLET BY MOUTH EVERY DAY FOR HIGH CHOLESTEROL 08/10/14  Yes [provider]  metoprolol tartrate (LOPRESSOR) 25 MG tablet Take 1 tablet (25 mg total) by mouth 2 (two) times daily. 12/17/17  Yes Epifanio Lesches, MD  Multiple Vitamin (MULTI-VITAMINS) TABS Take 1 tablet by mouth daily. Reported on 04/01/2016   Yes [provider]  omeprazole (PRILOSEC) 20 MG capsule TAKE 1 CAPSULE BY MOUTH  DAILY. 11/05/16  Yes [name]., MD  RENVELA 800 MG tablet Take 1,600 mg by mouth 3 (three) times daily.  11/05/16  Yes [provider]  sucralfate (CARAFATE) 1 g  tablet Take 1 tablet (1 g total) by mouth 4 (four) times daily -  before meals and at bedtime. 07/17/17  Yes [name]., MD  traMADol Veatrice Bourbon) 50 MG tablet Take 1 tablet (50 mg total) every 12 (twelve) hours as needed by mouth. 09/26/17  Yes Bacigalupo, Dionne Bucy, MD    Family History  Problem Relation Age of Onset  . Alzheimer's disease Mother   . Kidney failure Father   . Bone cancer Sister   . Stomach cancer Sister      Social History   Tobacco Use  . Smoking status: Current Every Day Smoker    Packs/day: 0.50    Types: Cigarettes  . Smokeless tobacco: Never Used  Substance Use Topics  . Alcohol use: No    Alcohol/week: 0.0 oz  . Drug use: No    Allergies as of 02/09/2018  . (No Known Allergies)    Review of Systems:    All systems reviewed and negative except where noted in HPI.   Physical Exam:  Vital signs in last 24 hours: Temp:  [97.5 F (36.4 C)-98.3 F (36.8 C)] 98.2 F (36.8 C) (04/03 0759) Pulse Rate:  [75-97] 75 (04/03 0759) Resp:  [15-23] 20 (04/03 0759) BP: (98-123)/(51-77) 115/70 (04/03 0759) SpO2:  [89 %-100 %] 92 % (04/03 0759) Weight:  [177 lb 9.6 oz (80.6 kg)-181 lb 9.6 oz (82.4 kg)] 177 lb 9.6 oz (80.6 kg) (04/03 0312) Last BM Date: 02/10/18 General:   Pleasant, cooperative in NAD Head:  Normocephalic and atraumatic. Eyes:   No icterus.   Conjunctiva pink. PERRLA. Ears:  Normal auditory acuity. Neck:  Supple; no masses or thyroidomegaly Lungs: Respirations even and unlabored. Lungs clear to auscultation bilaterally.   No wheezes, crackles, or rhonchi.  Heart:  Regular rate and rhythm;  Without murmur, clicks, rubs or gallops Abdomen:  Soft, nondistended, nontender. Normal bowel sounds. No appreciable masses or hepatomegaly.  No rebound or guarding.  Rectal:  Not performed. Msk:  Symmetrical without gross deformities.   Extremities:  Without edema, cyanosis or clubbing. Neurologic:  Alert and oriented x3;  grossly normal  neurologically. Skin:  Intact without significant lesions or rashes. Cervical Nodes:  No significant cervical adenopathy. Psych:  Alert and cooperative. Normal affect.  LAB RESULTS: Recent Labs    02/09/18 2053 02/10/18 0516 02/10/18 1447 02/10/18 1814 02/11/18 0229  WBC 5.8 4.4  --   --   --   HGB 8.2* 7.6* 7.4* 7.8* 7.9*  HCT 25.2* 23.4* 23.5* 24.4* 24.8*  PLT 146* 131*  --   --   --    BMET Recent Labs    02/09/18 2053 02/10/18 0516  NA 138 140  K 4.7 4.5  CL 97* 100*  CO2 25 27  GLUCOSE 112* 96  BUN  88* 87*  CREATININE 8.56* 8.80*  CALCIUM 9.3 9.1   LFT Recent Labs    02/09/18 2053  PROT 7.3  ALBUMIN 2.9*  AST 19  ALT 10*  ALKPHOS 86  BILITOT 1.4*   PT/INR Recent Labs    02/09/18 2053  LABPROT 14.8  INR 1.17    STUDIES: No results found.    Impression / Plan:   Frank Huang is a 84 y.o. y/o male with melanotic stools.  The patient's hemoglobin has been stable since admission but is lower than his baseline.  The patient has had no further sign of GI bleeding.  The patient will be set up for an EGD for tomorrow.  The patient had already eaten today.  The patient and his wife have been explained the plan and agree with it. I have discussed risks & benefits which include, but are not limited to, bleeding, infection, perforation & drug reaction.  The patient agrees with this plan & written consent will be obtained.     Thank you for involving me in the care of this patient.      LOS: 1 day   Lucilla Lame, MD  02/11/2018, 12:24 PM   Note: This dictation was prepared with Dragon dictation along with smaller phrase technology. Any transcriptional errors that result from this process are unintentional.

## 2018-02-11 NOTE — Progress Notes (Signed)
[facility]  ROUNDING NOTE   Subjective:   Wife and family at bedside.   Hemodialysis treatment yesterday. Tolerated treatment well. UF of 872m  Hemoglobin 7.9   Objective:  Vital signs in last 24 hours:  Temp:  [97.5 F (36.4 C)-98.3 F (36.8 C)] 98.2 F (36.8 C) (04/03 0759) Pulse Rate:  [75-97] 75 (04/03 0759) Resp:  [15-23] 20 (04/03 0759) BP: (98-123)/(51-77) 115/70 (04/03 0759) SpO2:  [92 %-100 %] 92 % (04/03 0759) Weight:  [80.6 kg (177 lb 9.6 oz)-82.4 kg (181 lb 9.6 oz)] 80.6 kg (177 lb 9.6 oz) (04/03 0312)  Weight change: -6.298 kg (-13 lb 14.2 oz) Filed Weights   02/10/18 1701 02/10/18 1733 02/11/18 0312  Weight: 81 kg (178 lb 9.2 oz) 82.4 kg (181 lb 9.6 oz) 80.6 kg (177 lb 9.6 oz)    Intake/Output: I/O last 3 completed shifts: In: 904.2 [P.O.:240; I.V.:664.2] Out: 12778[Urine:300; Other:883]   Intake/Output this shift:  No intake/output data recorded.  Physical Exam: General: NAD,   Head: Normocephalic, atraumatic. Moist oral mucosal membranes  Eyes: Anicteric, PERRL  Neck: Supple, trachea midline  Lungs:  Clear to auscultation  Heart: Regular rate and rhythm  Abdomen:  Soft, nontender,   Extremities: no peripheral edema.  Neurologic: Nonfocal, moving all four extremities  Skin: No lesions  Access: Left AVF    Basic Metabolic Panel: Recent Labs  Lab 02/09/18 2053 02/10/18 0516  NA 138 140  K 4.7 4.5  CL 97* 100*  CO2 25 27  GLUCOSE 112* 96  BUN 88* 87*  CREATININE 8.56* 8.80*  CALCIUM 9.3 9.1    Liver Function Tests: Recent Labs  Lab 02/09/18 2053  AST 19  ALT 10*  ALKPHOS 86  BILITOT 1.4*  PROT 7.3  ALBUMIN 2.9*   No results for input(s): LIPASE, AMYLASE in the last 168 hours. No results for input(s): AMMONIA in the last 168 hours.  CBC: Recent Labs  Lab 02/09/18 2053 02/10/18 0516 02/10/18 1447 02/10/18 1814 02/11/18 0229  WBC 5.8 4.4  --   --   --   HGB 8.2* 7.6* 7.4* 7.8* 7.9*  HCT 25.2* 23.4*  23.5* 24.4* 24.8*  MCV 97.6 97.7  --   --   --   PLT 146* 131*  --   --   --     Cardiac Enzymes: Recent Labs  Lab 02/10/18 0100  TROPONINI 0.05*    BNP: Invalid input(s): POCBNP  CBG: No results for input(s): GLUCAP in the last 168 hours.  Microbiology: Results for orders placed or performed in visit on 01/26/18  Urine Culture     Status: Abnormal   Collection Time: 01/27/18 10:30 AM  Result Value Ref Range Status   Urine Culture, Routine Final report (A)  Final   Organism ID, Bacteria Comment (A)  Final    Comment: Staphylococcus epidermidis Greater than 100,000 colony forming units per mL Based on susceptibility to oxacillin this isolate would be susceptible to: *Penicillinase-stable penicillins, such as:   Cloxacillin, Dicloxacillin, Nafcillin *Beta-lactam combination agents, such as:   Amoxicillin-clavulanic acid, Ampicillin-sulbactam,   Piperacillin-tazobactam *Oral cephems, such as:   Cefaclor, Cefdinir, Cefpodoxime, Cefprozil, Cefuroxime,   Cephalexin, Loracarbef *Parenteral cephems, such as:   Cefazolin, Cefepime, Cefotaxime, Cefotetan, Ceftaroline,   Ceftizoxime, Ceftriaxone, Cefuroxime *Carbapenems, such as:   Doripenem, Ertapenem, Imipenem, Meropenem    Antimicrobial Susceptibility Comment  Final    Comment:       ** S = Susceptible; I = Intermediate; R =  Resistant **                    P = Positive; N = Negative             MICS are expressed in micrograms per mL    Antibiotic                 RSLT#1    RSLT#2    RSLT#3    RSLT#4 Ciprofloxacin                  R Gentamicin                     S Levofloxacin                   I Linezolid                      S Nitrofurantoin                 S Oxacillin                      S Penicillin                     R Quinupristin/Dalfopristin      S Rifampin                       S Tetracycline                   S Trimethoprim/Sulfa             R Vancomycin                     S     Coagulation  Studies: Recent Labs    02/09/18 2052-01-09  LABPROT 14.8  INR 1.17    Urinalysis: Recent Labs    02/10/18 0048  COLORURINE YELLOW*  LABSPEC 1.012  PHURINE 6.0  GLUCOSEU NEGATIVE  HGBUR SMALL*  BILIRUBINUR NEGATIVE  KETONESUR NEGATIVE  PROTEINUR 100*  NITRITE NEGATIVE  LEUKOCYTESUR MODERATE*      Imaging: US Renal  Result Date: 02/11/2018 CLINICAL DATA:  End-stage renal disease dysuria EXAM: RENAL / URINARY TRACT ULTRASOUND COMPLETE COMPARISON:  CT abdomen 04/30/2016 FINDINGS: Right Kidney: Length: 10.5 cm. Cortex diffusely echogenic. Multiple renal cysts. Largest cyst 3 x 4 cm. Left Kidney: Length: 9.1 cm. Echogenic cortex diffusely with numerous renal cysts. Largest cyst 5.6 x 5.2 cm left lower pole. Bladder: Not visualized IMPRESSION: Increased cortical echogenicity with cortical atrophy compatible with chronic renal failure. Numerous bilateral renal cysts. Electronically Signed   By: Franchot Gallo M.D.   On: 02/11/2018 13:36     Medications:   . sodium chloride    . octreotide  (SANDOSTATIN)    IV infusion 50 mcg/hr (02/11/18 1452)  . pantoprozole (PROTONIX) infusion 8 mg/hr (02/11/18 0833)   . calcitRIOL  0.5 mcg Oral Once per day on Mon Wed Fri  . donepezil  10 mg Oral QHS  . feeding supplement (NEPRO CARB STEADY)  237 mL Oral BID BM  . lidocaine-prilocaine  1 application Topical Once  . midodrine  10 mg Oral TID WC  . multivitamin  1 tablet Oral QHS  . [START ON 02/13/2018] multivitamin with minerals  1 tablet Oral Once per day on Mon Wed Fri  . [START ON 02/13/2018] pantoprazole  40  mg Intravenous Q12H  . pravastatin  80 mg Oral q1800  . sevelamer carbonate  1,600 mg Oral TID  . sodium chloride flush  3 mL Intravenous Q12H  . sucralfate  1 g Oral TID AC & HS  . tamsulosin  0.4 mg Oral Daily  . vitamin C  250 mg Oral BID   sodium chloride, acetaminophen **OR** acetaminophen, HYDROcodone-acetaminophen, ondansetron **OR** ondansetron (ZOFRAN) IV, sodium chloride  flush  Assessment/ Plan:  [name] is a 84 y.o. white male end stage renal disease on hemodialysis, multiple myeloma, coronary artery disease, atrial fibrillation  [facility]/ [facility]/ TTS  1.  End-stage renal disease: missed Saturday treatment. Tolerated treatment well yesterday.  - Resume TTS schedule  2.  Anemia of chronic kidney disease: with GI bleed. Hemoglobin 7.9 with multiple myeloma - Appreciate GI input. EGD for tomorrow.   3.  Secondary hyperparathyroidism - calcitriol - sevelamer  4.  Hypertension: blood pressure at goal.    LOS: 1 Kyliegh Jester 4/3/20193:25 PM

## 2018-02-11 NOTE — Progress Notes (Signed)
[facility] [place] NAME: Frank Huang    MR#:  [id number]  DATE OF BIRTH:  08-20-1932  SUBJECTIVE: Seen at bedside, admitted yesterday for melena.  According to wife patient not eating for a long time associated with weight loss, dry heaving.  If he is concerned about his poor urine output since admission.  Even though he is on dialysis he still makes urine and wife says that his urine output is really minimal.  Patient also has constipation  CHIEF COMPLAINT:   Chief Complaint  Patient presents with  . Rectal Bleeding    REVIEW OF SYSTEMS:   ROS CONSTITUTIONAL: No fever, fatigue or weakness.  EYES: No blurred or double vision.  EARS, NOSE, AND THROAT: No tinnitus or ear pain.  RESPIRATORY: No cough, shortness of breath, wheezing or hemoptysis.  CARDIOVASCULAR: No chest pain, orthopnea, edema.  GASTROINTESTINAL: Nausea, not able to keep food down because he feels nauseous and does not want to eat gENITOURINARY: No dysuria, hematuria.  ENDOCRINE: No polyuria, nocturia,  HEMATOLOGY: No anemia, easy bruising or bleeding SKIN: No rash or lesion. MUSCULOSKELETAL: No joint pain or arthritis.   NEUROLOGIC: No tingling, numbness, weakness.  PSYCHIATRY: No anxiety or depression.   DRUG ALLERGIES:  No Known Allergies  VITALS:  Blood pressure 115/70, pulse 75, temperature 98.2 F (36.8 C), temperature source Oral, resp. rate 20, height 6' 3" (1.905 m), weight 80.6 kg (177 lb 9.6 oz), SpO2 92 %.  PHYSICAL EXAMINATION:  GENERAL:  84 y.o.-year-old patient lying in the bed with no acute distress.  He appears cachectic. EYES: Pupils equal, round, reactive to light. No scleral icterus. Extraocular muscles intact.  HEENT: Head atraumatic, normocephalic. Oropharynx and nasopharynx clear.  NECK:  Supple, no jugular venous distention. No thyroid enlargement, no tenderness.  LUNGS: Normal breath sounds bilaterally, no wheezing, rales,rhonchi or  crepitation. No use of accessory muscles of respiration.  CARDIOVASCULAR: S1, S2 normal. No murmurs, rubs, or gallops.  ABDOMEN: Soft, nontender, nondistended. Bowel sounds present. No organomegaly or mass.  EXTREMITIES: No pedal edema, cyanosis, or clubbing.  NEUROLOGIC: Cranial nerves II through XII are intact. Muscle strength 5/5 in all extremities. Sensation intact. Gait not checked.  PSYCHIATRIC: The patient is alert and oriented x 3.  SKIN: No obvious rash, lesion, or ulcer.    LABORATORY PANEL:   CBC Recent Labs  Lab 02/10/18 0516  02/11/18 0229  WBC 4.4  --   --   HGB 7.6*   < > 7.9*  HCT 23.4*   < > 24.8*  PLT 131*  --   --    < > = values in this interval not displayed.   ------------------------------------------------------------------------------------------------------------------  Chemistries  Recent Labs  Lab 02/09/18 2053 02/10/18 0516  NA 138 140  K 4.7 4.5  CL 97* 100*  CO2 25 27  GLUCOSE 112* 96  BUN 88* 87*  CREATININE 8.56* 8.80*  CALCIUM 9.3 9.1  AST 19  --   ALT 10*  --   ALKPHOS 86  --   BILITOT 1.4*  --    ------------------------------------------------------------------------------------------------------------------  Cardiac Enzymes Recent Labs  Lab 02/10/18 0100  TROPONINI 0.05*   ------------------------------------------------------------------------------------------------------------------  RADIOLOGY:  US Renal  Result Date: 02/11/2018 CLINICAL DATA:  End-stage renal disease dysuria EXAM: RENAL / URINARY TRACT ULTRASOUND COMPLETE COMPARISON:  CT abdomen 04/30/2016 FINDINGS: Right Kidney: Length: 10.5 cm. Cortex diffusely echogenic. Multiple renal cysts. Largest cyst 3 x 4 cm. Left Kidney: Length: 9.1  cm. Echogenic cortex diffusely with numerous renal cysts. Largest cyst 5.6 x 5.2 cm left lower pole. Bladder: Not visualized IMPRESSION: Increased cortical echogenicity with cortical atrophy compatible with chronic renal failure.  Numerous bilateral renal cysts. Electronically Signed   By: Charles  Clark M.D.   On: 02/11/2018 13:36    EKG:   Orders placed or performed during the hospital encounter of 02/09/18  . EKG 12-Lead  . EKG 12-Lead    ASSESSMENT AND PLAN:   #1 acute melena, possible slow GI bleed, because of weight loss, nausea, poor p.o. intake patient needs EGD which is scheduled for tomorrow as per discussion with GI.  Continue PPIs, hemoglobin is stable.. 2.  History of multiple myeloma: Well. 3.  ESRD on hemodialysis, patient received hemodialysis yesterday, patient is on Tuesday, Thursday, Saturday schedule but patient missed 2 dialysis sessions on last Thursday, Saturday.  #4 acute on chronic systolic heart failure, patient received hemodialysis, appears euvolemic today. Chronic hypotension, BP improved with midodrine.  #5. history of CAD, PCI, history of chronic atrial fibrillation, patient not on anticoagulation because of the bleeding risk.  He is not getting aspirin because of  MALENA  on this admission. Spoke with patient's wife.     All the records are reviewed and case discussed with Care Management/Social Workerr. Management plans discussed with the patient, family and they are in agreement.  CODE STATUS:FULL  TOTAL TIME TAKING CARE OF THIS PATIENT: 35minutes.   POSSIBLE D/C IN 1-2DAYS, DEPENDING ON CLINICAL CONDITION.     M.D on 02/11/2018 at 2:06 PM  Between 7am to 6pm - Pager - [phone]  After 6pm go to [email or web address] - password [email or web address]  [facility]  Office  [phone]  CC: Primary care physician; Gilbert, Richard L Jr., MD   Note: This dictation was prepared with Dragon dictation along with smaller phrase technology. Any transcriptional errors that result from this process are unintentional. 

## 2018-02-12 ENCOUNTER — Inpatient Hospital Stay: Payer: Medicare Other | Admitting: Anesthesiology

## 2018-02-12 ENCOUNTER — Encounter
Admission: EM | Disposition: A | Discharge status: Hospice | Payer: Self-pay | Source: Home / Self Care | Attending: Internal Medicine

## 2018-02-12 ENCOUNTER — Encounter: Payer: Self-pay | Admitting: Certified Registered Nurse Anesthetist

## 2018-02-12 DIAGNOSIS — K2970 Gastritis, unspecified, without bleeding: Secondary | ICD-10-CM

## 2018-02-12 DIAGNOSIS — K921 Melena: Secondary | ICD-10-CM

## 2018-02-12 DIAGNOSIS — K2980 Duodenitis without bleeding: Secondary | ICD-10-CM

## 2018-02-12 HISTORY — PX: ESOPHAGOGASTRODUODENOSCOPY (EGD) WITH PROPOFOL: SHX5813

## 2018-02-12 LAB — RENAL FUNCTION PANEL
ANION GAP: 13 (ref 5–15)
Albumin: 2.9 g/dL — ABNORMAL LOW (ref 3.5–5.0)
BUN: 54 mg/dL — ABNORMAL HIGH (ref 6–20)
CALCIUM: 8.9 mg/dL (ref 8.9–10.3)
CO2: 26 mmol/L (ref 22–32)
Chloride: 99 mmol/L — ABNORMAL LOW (ref 101–111)
Creatinine, Ser: 7.04 mg/dL — ABNORMAL HIGH (ref 0.61–1.24)
GFR calc Af Amer: 7 mL/min — ABNORMAL LOW (ref >60)
GFR calc non Af Amer: 6 mL/min — ABNORMAL LOW (ref >60)
Glucose, Bld: 94 mg/dL (ref 65–99)
Phosphorus: 5.7 mg/dL — ABNORMAL HIGH (ref 2.5–4.6)
Potassium: 4.9 mmol/L (ref 3.5–5.1)
SODIUM: 138 mmol/L (ref 135–145)

## 2018-02-12 LAB — CBC
HCT: 26.1 % — ABNORMAL LOW (ref 40.0–52.0)
HEMOGLOBIN: 8 g/dL — AB (ref 13.0–18.0)
MCH: 31.3 pg (ref 26.0–34.0)
MCHC: 30.8 g/dL — ABNORMAL LOW (ref 32.0–36.0)
MCV: 101.6 fL — ABNORMAL HIGH (ref 80.0–100.0)
Platelets: 129 10*3/uL — ABNORMAL LOW (ref 150–440)
RBC: 2.57 MIL/uL — ABNORMAL LOW (ref 4.40–5.90)
RDW: 18 % — ABNORMAL HIGH (ref 11.5–14.5)
WBC: 4.6 10*3/uL (ref 3.8–10.6)

## 2018-02-12 LAB — BASIC METABOLIC PANEL
Anion gap: 14 (ref 5–15)
BUN: 55 mg/dL — ABNORMAL HIGH (ref 6–20)
CHLORIDE: 97 mmol/L — AB (ref 101–111)
CO2: 26 mmol/L (ref 22–32)
CREATININE: 6.78 mg/dL — AB (ref 0.61–1.24)
Calcium: 9.1 mg/dL (ref 8.9–10.3)
GFR calc Af Amer: 8 mL/min — ABNORMAL LOW (ref >60)
GFR calc non Af Amer: 7 mL/min — ABNORMAL LOW (ref >60)
Glucose, Bld: 92 mg/dL (ref 65–99)
Potassium: 4.8 mmol/L (ref 3.5–5.1)
SODIUM: 137 mmol/L (ref 135–145)

## 2018-02-12 LAB — MISC LABCORP TEST (SEND OUT): Labcorp test code: 1479

## 2018-02-12 SURGERY — ESOPHAGOGASTRODUODENOSCOPY (EGD) WITH PROPOFOL
Anesthesia: General

## 2018-02-12 MED ORDER — PROPOFOL 10 MG/ML IV BOLUS
INTRAVENOUS | Status: DC | PRN
  Administered 2018-02-12: 50 mg via INTRAVENOUS

## 2018-02-12 MED ORDER — PROPOFOL 500 MG/50ML IV EMUL
INTRAVENOUS | Status: AC
  Filled 2018-02-12: qty 50

## 2018-02-12 MED ORDER — LIDOCAINE HCL (CARDIAC) 20 MG/ML IV SOLN
INTRAVENOUS | Status: DC | PRN
  Administered 2018-02-12: 80 mg via INTRAVENOUS

## 2018-02-12 MED ORDER — SODIUM CHLORIDE 0.9 % IV SOLN: INTRAVENOUS | Status: DC

## 2018-02-12 MED ORDER — PANTOPRAZOLE SODIUM 40 MG PO TBEC
40.0000 mg | DELAYED_RELEASE_TABLET | Freq: Every day | ORAL | Status: DC
  Filled 2018-02-12: qty 1

## 2018-02-12 MED ORDER — LIDOCAINE HCL (PF) 2 % IJ SOLN
INTRAMUSCULAR | Status: AC
  Filled 2018-02-12: qty 10

## 2018-02-12 MED ORDER — FENTANYL CITRATE (PF) 100 MCG/2ML IJ SOLN
INTRAMUSCULAR | Status: DC | PRN
  Administered 2018-02-12: 50 ug via INTRAVENOUS

## 2018-02-12 MED ORDER — FENTANYL CITRATE (PF) 100 MCG/2ML IJ SOLN
INTRAMUSCULAR | Status: AC
  Filled 2018-02-12: qty 2

## 2018-02-12 NOTE — Progress Notes (Signed)
The patient had an EGD today that showed gastritis and duodenitis without any sign of active bleeding.  The patient's hemoglobin has been stable.  The patient should be put on a PPI.  Nothing further to do from a GI point of view.  I will sign off.  Please call if any further GI concerns or questions.  We would like to thank you for the opportunity to participate in the care of Frank Huang.

## 2018-02-12 NOTE — Progress Notes (Signed)
Nutrition Brief Note  Pt's ascorbic acid lab returned significant for scurvy. Pt ordered for vitamin C 250mg  po BID. Recommend continue vitamin C supplementation after discharge; recommend recheck ascorbic acid level in 30 days.   Test Ordered: [id number] Vitamin C  Vitamin C           0.1     [L ] mg/dL  BN    Reference Range: 0.2-2.0      Koleen Distance MS, RD, LDN Pager #[phone] After Hours Pager: [phone]

## 2018-02-12 NOTE — Progress Notes (Signed)
MD paged to make aware that pt is refusing to have dialysis today

## 2018-02-12 NOTE — Op Note (Signed)
[facility]astroenterology Patient Name: Frank Huang Procedure Date: 02/12/2018 2:08 PM MRN: [id number] Account #: [id number] Date of Birth: 07-03-1932 Admit Type: Inpatient Age: 84 Room: [facility] ENDO ROOM 4 Gender: Male Note Status: Finalized Procedure:            Upper GI endoscopy Indications:          Melena Providers:            Lucilla Lame MD, MD Referring MD:         Janine Ores. Rosanna Randy, MD (Referring MD) Medicines:            Propofol per Anesthesia Complications:        No immediate complications. Procedure:            Pre-Anesthesia Assessment:                       - Prior to the procedure, a History and Physical was                        performed, and patient medications and allergies were                        reviewed. The patient's tolerance of previous                        anesthesia was also reviewed. The risks and benefits of                        the procedure and the sedation options and risks were                        discussed with the patient. All questions were                        answered, and informed consent was obtained. Prior                        Anticoagulants: The patient has taken no previous                        anticoagulant or antiplatelet agents. ASA Grade                        Assessment: II - A patient with mild systemic disease.                        After reviewing the risks and benefits, the patient was                        deemed in satisfactory condition to undergo the                        procedure.                       After obtaining informed consent, the endoscope was                        passed under direct vision. Throughout the procedure,  the patient's blood pressure, pulse, and oxygen                        saturations were monitored continuously. The Endoscope                        was introduced through the mouth, and advanced to the                        second part  of duodenum. The upper GI endoscopy was                        accomplished without difficulty. The patient tolerated                        the procedure well. Findings:      The examined esophagus was normal.      Localized moderate inflammation characterized by erythema was found in       the gastric antrum.      Diffuse severe inflammation characterized by erythema was found in the       entire duodenum. Impression:           - Normal esophagus.                       - Gastritis.                       - Duodenitis.                       - No specimens collected. Recommendation:       - Return patient to hospital ward for ongoing care.                       - Resume previous diet.                       - Continue present medications.                       - Use a proton pump inhibitor PO daily. Procedure Code(s):    --- Professional ---                       [phone], Esophagogastroduodenoscopy, flexible, transoral;                        diagnostic, including collection of specimen(s) by                        brushing or washing, when performed (separate procedure) Diagnosis Code(s):    --- Professional ---                       K92.1, Melena (includes Hematochezia)                       K29.80, Duodenitis without bleeding                       K29.70, Gastritis, unspecified, without bleeding CPT copyright 2017 American Medical Association. All rights reserved. The codes documented in this report are preliminary and upon coder review  may  be revised to meet current compliance requirements. Lucilla Lame MD, MD 02/12/2018 2:18:23 PM This report has been signed electronically. Number of Addenda: 0 Note Initiated On: 02/12/2018 2:08 PM      [facility]

## 2018-02-12 NOTE — Transfer of Care (Signed)
Immediate Anesthesia Transfer of Care Note  Patient: Frank Huang  Procedure(s) Performed: ESOPHAGOGASTRODUODENOSCOPY (EGD) WITH PROPOFOL (N/A )  Patient Location: PACU  Anesthesia Type:General  Level of Consciousness: sedated  Airway & Oxygen Therapy: Patient Spontanous Breathing and Patient connected to nasal cannula oxygen  Post-op Assessment: Report given to RN  Post vital signs: Reviewed and stable  Last Vitals:  Vitals Value Taken Time  BP 82/45 02/12/2018  2:22 PM  Temp 36.1 C 02/12/2018  2:21 PM  Pulse 82 02/12/2018  2:22 PM  Resp 16 02/12/2018  2:22 PM  SpO2 100 % 02/12/2018  2:22 PM  Vitals shown include unvalidated device data.  Last Pain:  Vitals:   02/12/18 1421  TempSrc: Temporal  PainSc: Asleep         Complications: No apparent anesthesia complications

## 2018-02-12 NOTE — Plan of Care (Signed)
  Problem: Education: Goal: Knowledge of General Education information will improve Outcome: Progressing   Problem: Clinical Measurements: Goal: Ability to maintain clinical measurements within normal limits will improve Outcome: Not Progressing Goal: Cardiovascular complication will be avoided Outcome: Progressing   Problem: Activity: Goal: Risk for activity intolerance will decrease Outcome: Not Progressing   Problem: Pain Managment: Goal: General experience of comfort will improve Outcome: Not Progressing   Problem: Clinical Measurements: Goal: Complications related to the disease process, condition or treatment will be avoided or minimized Outcome: Progressing

## 2018-02-12 NOTE — Anesthesia Post-op Follow-up Note (Signed)
Anesthesia QCDR form completed.        

## 2018-02-12 NOTE — Progress Notes (Signed)
[facility]  ROUNDING NOTE   Subjective:   Wife and family at bedside.   EGD done today.   Patient is not interested in continuing dialysis.    Objective:  Vital signs in last 24 hours:  Temp:  [97 F (36.1 C)-97.9 F (36.6 C)] 97 F (36.1 C) (04/04 1421) Pulse Rate:  [80-100] 86 (04/04 1531) Resp:  [15-18] 18 (04/04 1531) BP: (82-118)/(41-76) 96/44 (04/04 1531) SpO2:  [92 %-100 %] 97 % (04/04 1531) Weight:  [83.9 kg (184 lb 14.4 oz)] 83.9 kg (184 lb 14.4 oz) (04/04 0507)  Weight change: 2.17 kg (4 lb 12.6 oz) Filed Weights   02/10/18 1733 02/11/18 0312 02/12/18 0507  Weight: 82.4 kg (181 lb 9.6 oz) 80.6 kg (177 lb 9.6 oz) 83.9 kg (184 lb 14.4 oz)    Intake/Output: I/O last 3 completed shifts: In: 1772.5 [I.V.:1772.5] Out: 350 [Urine:350]   Intake/Output this shift:  No intake/output data recorded.  Physical Exam: General: Lethargy  Head: Normocephalic, atraumatic. Moist oral mucosal membranes  Eyes: Anicteric, PERRL  Neck: Supple, trachea midline  Lungs:  Clear to auscultation  Heart: Regular rate and rhythm  Abdomen:  Soft, nontender,   Extremities: no peripheral edema.  Neurologic: somnulent  Skin: No lesions  Access: Left AVF    Basic Metabolic Panel: Recent Labs  Lab 02/09/18 2053 02/10/18 0516 02/12/18 1031 02/12/18 1155  NA 138 140 138 137  K 4.7 4.5 4.9 4.8  CL 97* 100* 99* 97*  CO2 _0 GLUCOSE 112* 96 94 92  BUN 88* 87* 54* 55*  CREATININE 8.56* 8.80* 7.04* 6.78*  CALCIUM 9.3 9.1 8.9 9.1  PHOS  --   --  5.7*  --     Liver Function Tests: Recent Labs  Lab 02/09/18 2053 02/12/18 1031  AST 19  --   ALT 10*  --   ALKPHOS 86  --   BILITOT 1.4*  --   PROT 7.3  --   ALBUMIN 2.9* 2.9*   No results for input(s): LIPASE, AMYLASE in the last 168 hours. No results for input(s): AMMONIA in the last 168 hours.  CBC: Recent Labs  Lab 02/09/18 2053 02/10/18 0516 02/10/18 1447 02/10/18 1814 02/11/18 0229  02/12/18 1031  WBC 5.8 4.4  --   --   --  4.6  HGB 8.2* 7.6* 7.4* 7.8* 7.9* 8.0*  HCT 25.2* 23.4* 23.5* 24.4* 24.8* 26.1*  MCV 97.6 97.7  --   --   --  101.6*  PLT 146* 131*  --   --   --  129*    Cardiac Enzymes: Recent Labs  Lab 02/10/18 0100  TROPONINI 0.05*    BNP: Invalid input(s): POCBNP  CBG: No results for input(s): GLUCAP in the last 168 hours.  Microbiology: Results for orders placed or performed in visit on 01/26/18  Urine Culture     Status: Abnormal   Collection Time: 01/27/18 10:30 AM  Result Value Ref Range Status   Urine Culture, Routine Final report (A)  Final   Organism ID, Bacteria Comment (A)  Final    Comment: Staphylococcus epidermidis Greater than 100,000 colony forming units per mL Based on susceptibility to oxacillin this isolate would be susceptible to: *Penicillinase-stable penicillins, such as:   Cloxacillin, Dicloxacillin, Nafcillin *Beta-lactam combination agents, such as:   Amoxicillin-clavulanic acid, Ampicillin-sulbactam,   Piperacillin-tazobactam *Oral cephems, such as:   Cefaclor, Cefdinir, Cefpodoxime, Cefprozil, Cefuroxime,   Cephalexin, Loracarbef *Parenteral cephems, such as:  Cefazolin, Cefepime, Cefotaxime, Cefotetan, Ceftaroline,   Ceftizoxime, Ceftriaxone, Cefuroxime *Carbapenems, such as:   Doripenem, Ertapenem, Imipenem, Meropenem    Antimicrobial Susceptibility Comment  Final    Comment:       ** S = Susceptible; I = Intermediate; R = Resistant **                    P = Positive; N = Negative             MICS are expressed in micrograms per mL    Antibiotic                 RSLT#1    RSLT#2    RSLT#3    RSLT#4 Ciprofloxacin                  R Gentamicin                     S Levofloxacin                   I Linezolid                      S Nitrofurantoin                 S Oxacillin                      S Penicillin                     R Quinupristin/Dalfopristin      S Rifampin                        S Tetracycline                   S Trimethoprim/Sulfa             R Vancomycin                     S     Coagulation Studies: Recent Labs    02/09/18 01-15-2052  LABPROT 14.8  INR 1.17    Urinalysis: Recent Labs    02/10/18 0048  COLORURINE YELLOW*  LABSPEC 1.012  PHURINE 6.0  GLUCOSEU NEGATIVE  HGBUR SMALL*  BILIRUBINUR NEGATIVE  KETONESUR NEGATIVE  PROTEINUR 100*  NITRITE NEGATIVE  LEUKOCYTESUR MODERATE*      Imaging: US Renal  Result Date: 02/11/2018 CLINICAL DATA:  End-stage renal disease dysuria EXAM: RENAL / URINARY TRACT ULTRASOUND COMPLETE COMPARISON:  CT abdomen 04/30/2016 FINDINGS: Right Kidney: Length: 10.5 cm. Cortex diffusely echogenic. Multiple renal cysts. Largest cyst 3 x 4 cm. Left Kidney: Length: 9.1 cm. Echogenic cortex diffusely with numerous renal cysts. Largest cyst 5.6 x 5.2 cm left lower pole. Bladder: Not visualized IMPRESSION: Increased cortical echogenicity with cortical atrophy compatible with chronic renal failure. Numerous bilateral renal cysts. Electronically Signed   By: Franchot Gallo M.D.   On: 02/11/2018 13:36     Medications:   . sodium chloride     . calcitRIOL  0.5 mcg Oral Once per day on Mon Wed 01-14-23  . donepezil  10 mg Oral QHS  . feeding supplement (NEPRO CARB STEADY)  237 mL Oral BID BM  . lidocaine-prilocaine  1 application Topical Once  . midodrine  10 mg Oral TID WC  . multivitamin  1 tablet Oral QHS  . [START ON  02/13/2018] multivitamin with minerals  1 tablet Oral Once per day on Mon Wed Fri  . pantoprazole  40 mg Oral Daily  . pravastatin  80 mg Oral q1800  . sevelamer carbonate  1,600 mg Oral TID  . sodium chloride flush  3 mL Intravenous Q12H  . sucralfate  1 g Oral TID AC & HS  . tamsulosin  0.4 mg Oral Daily  . vitamin C  250 mg Oral BID   sodium chloride, acetaminophen **OR** acetaminophen, HYDROcodone-acetaminophen, ondansetron **OR** ondansetron (ZOFRAN) IV, sodium chloride flush  Assessment/ Plan:  [name] is a 84 y.o. white male end stage renal disease on hemodialysis, multiple myeloma, coronary artery disease, atrial fibrillation  [facility]/ [facility]/ TTS  1.  End-stage renal disease: Patient is unsure if he wants to continue dialysis.  - Awaiting palliative care - Hold dialysis for now.   2.  Anemia of chronic kidney disease: with GI bleed.  - Pantoprazole ordered - Appreciate GI input. EGD today with no findings.   3.  Secondary hyperparathyroidism - calcitriol - sevelamer  4.  Hypertension: blood pressure at goal.  - tamsulosin.    LOS: 2 Rudell Marlowe 4/4/20194:17 PM

## 2018-02-12 NOTE — Progress Notes (Signed)
NO CHARGE NOTE  Palliative Medicine consulted for goals of care discussion. Spoke with wife this afternoon. Patient in procedures most of the day. Wife verbalized her, patient, and family have been in discussion and pretty sure they are going to shift with DNR and comfort path however, it has been a long day and she would like for team to have [place] discussion tomorrow so that patient will be less drowsy and other members can be present and involved.   Spoke with Dr. Juleen China who informed me that patient's wife and family have decided not to continue with dialysis. Family to discuss with team tomorrow during [place] conversation. Advised message will be passed along to colleague that is covering on tomorrow. Crystal, NP aware.   Thank you for your referral!   Alda Lea, NP-BC Palliative Medicine Team  Phone: [phone] Fax: [phone]

## 2018-02-12 NOTE — Progress Notes (Signed)
[facility] [place] [facility]   PATIENT NAME: Frank Huang    MR#:  [id number]  DATE OF BIRTH:  05/30/1932  SUBJECTIVE: Patient is sitting on sleeping with a pillow on the dining tray.  According to family it is not new.  Family noticed that he is confused at times.  Wife and the daughter someone to talk to palliative care also.  Patient is scheduled for EGD today because of large  black stool that happened at home.  Patient also has poor p.o. intake with nausea, family said he was dry heaving even with water this morning.  CHIEF COMPLAINT:   Chief Complaint  Patient presents with  . Rectal Bleeding   Patient appears irritable. REVIEW OF SYSTEMS:   Review of Systems  Constitutional: Positive for malaise/fatigue and weight loss. Negative for chills and fever.  HENT: Negative for hearing loss and tinnitus.   Eyes: Negative for blurred vision, double vision and photophobia.  Respiratory: Negative for cough, hemoptysis and shortness of breath.   Cardiovascular: Negative for palpitations, orthopnea and leg swelling.  Gastrointestinal: Negative for abdominal pain, diarrhea and vomiting.  Genitourinary: Negative for dysuria and urgency.  Musculoskeletal: Negative for myalgias and neck pain.  Skin: Negative for rash.  Neurological: Negative for dizziness, focal weakness, seizures, weakness and headaches.  Psychiatric/Behavioral: Positive for memory loss. The patient is nervous/anxious.      DRUG ALLERGIES:  No Known Allergies  VITALS:  Blood pressure 107/62, pulse 100, temperature 97.8 F (36.6 C), temperature source Oral, resp. rate 18, height 6' 3" (1.905 m), weight 83.9 kg (184 lb 14.4 oz), SpO2 94 %.  PHYSICAL EXAMINATION:  GENERAL:  84 y.o.-year-old patient lying in the bed with no acute distress.  He appears cachectic. EYES: Pupils equal, round, reactive to light. No scleral icterus. Extraocular muscles intact.  HEENT: Head atraumatic, normocephalic.  Oropharynx and nasopharynx clear.  NECK:  Supple, no jugular venous distention. No thyroid enlargement, no tenderness.  LUNGS: Normal breath sounds bilaterally, no wheezing, rales,rhonchi or crepitation. No use of accessory muscles of respiration.  CARDIOVASCULAR: S1, S2 normal. No murmurs, rubs, or gallops.  ABDOMEN: Soft, nontender, nondistended. Bowel sounds present. No organomegaly or mass.  EXTREMITIES: No pedal edema, cyanosis, or clubbing.  NEUROLOGIC: Cranial nerves II through XII are intact. Muscle strength 5/5 in all extremities. Sensation intact. Gait not checked.  PSYCHIATRIC: The patient is alert and oriented x 3.  SKIN: No obvious rash, lesion, or ulcer.    LABORATORY PANEL:   CBC Recent Labs  Lab 02/10/18 0516  02/11/18 0229  WBC 4.4  --   --   HGB 7.6*   < > 7.9*  HCT 23.4*   < > 24.8*  PLT 131*  --   --    < > = values in this interval not displayed.   ------------------------------------------------------------------------------------------------------------------  Chemistries  Recent Labs  Lab 02/09/18 2053 02/10/18 0516  NA 138 140  K 4.7 4.5  CL 97* 100*  CO2 25 27  GLUCOSE 112* 96  BUN 88* 87*  CREATININE 8.56* 8.80*  CALCIUM 9.3 9.1  AST 19  --   ALT 10*  --   ALKPHOS 86  --   BILITOT 1.4*  --    ------------------------------------------------------------------------------------------------------------------  Cardiac Enzymes Recent Labs  Lab 02/10/18 0100  TROPONINI 0.05*   ------------------------------------------------------------------------------------------------------------------  RADIOLOGY:  Us Renal  Result Date: 02/11/2018 CLINICAL DATA:  End-stage renal disease dysuria EXAM: RENAL / URINARY TRACT ULTRASOUND COMPLETE COMPARISON:    CT abdomen 04/30/2016 FINDINGS: Right Kidney: Length: 10.5 cm. Cortex diffusely echogenic. Multiple renal cysts. Largest cyst 3 x 4 cm. Left Kidney: Length: 9.1 cm. Echogenic cortex diffusely with  numerous renal cysts. Largest cyst 5.6 x 5.2 cm left lower pole. Bladder: Not visualized IMPRESSION: Increased cortical echogenicity with cortical atrophy compatible with chronic renal failure. Numerous bilateral renal cysts. Electronically Signed   By: Charles  Clark M.D.   On: 02/11/2018 13:36    EKG:   Orders placed or performed during the hospital encounter of 02/09/18  . EKG 12-Lead  . EKG 12-Lead    ASSESSMENT AND PLAN:   #1 acute melena, possible slow GI bleed, because of weight loss, nausea, poor p.o. intake patient needs EGD which is scheduled for today as per discussion with GI.  Continue PPIs, hemoglobin is stable..  2.  History of multiple myeloma: Follows up with oncologist regularly 3.  ESRD on hemodialysis, patient received hemodialysis yesterday, patient is on Tuesday, Thursday, Saturday schedule but patient missed 2 dialysis sessions on last Thursday, Saturday.,  Seen by nephrology, patient is on his schedule Tuesday, Thursday, Saturday, patient to get hemodialysis today   #4 acute on chronic systolic heart failure, ' no shortness of breath, continue routine hemodialysis.    Chronic hypotension, BP improved with midodrine.  #5. history of CAD, PCI, history of chronic atrial fibrillation, patient not on anticoagulation because of the bleeding risk.  He is not getting aspirin because of  MALENA  on this admission. Spoke with patient's wife.  I spoke with patient's wife and daughters extensively today, they want to talk to palliative care to discuss the goals of care because of his multiple medical problems of ESRD on hemodialysis, hypertension, CAD, multiple myeloma with chronic nausea vomiting, poor p.o. intake.  Family told me that if the EGD is negative for any acute problem T1 him to be comfortable but patient says he wants to continue dialysis .  Be he is confused now, wife is requesting  To talk . palliative care.  All the records are reviewed and case discussed with  Care Management/Social Workerr. Management plans discussed with the patient, family and they are in agreement.  CODE STATUS:FULL  TOTAL TIME TAKING CARE OF THIS PATIENT: 35minutes.   POSSIBLE D/C IN 1-2DAYS, DEPENDING ON CLINICAL CONDITION.     M.D on 02/12/2018 at 10:10 AM  Between 7am to 6pm - Pager - [phone]  After 6pm go to [email or web address] - password [email or web address]  [facility]  Office  [phone]  CC: Primary care physician; Gilbert, Richard L Jr., MD   Note: This dictation was prepared with Dragon dictation along with smaller phrase technology. Any transcriptional errors that result from this process are unintentional. 

## 2018-02-12 NOTE — Anesthesia Preprocedure Evaluation (Signed)
Anesthesia Evaluation  Patient identified by MRN, date of birth, ID band Patient confused    Reviewed: Allergy & Precautions, NPO status , Patient's Chart, lab work & pertinent test results  History of Anesthesia Complications Negative for: history of anesthetic complications  Airway Mallampati: II       Dental   Pulmonary neg sleep apnea, COPD,  COPD inhaler, Current Smoker,           Cardiovascular hypertension, Pt. on medications + Past MI, + Cardiac Stents and +CHF  (-) CABG and (-) Peripheral Vascular Disease (-) dysrhythmias (-) Valvular Problems/Murmurs     Neuro/Psych neg Seizures CVA (L hand weakness), Residual Symptoms    GI/Hepatic Neg liver ROS, GERD  Medicated and Controlled,  Endo/Other  neg diabetes  Renal/GU ESRF and DialysisRenal disease     Musculoskeletal   Abdominal   Peds  Hematology  (+) anemia ,   Anesthesia Other Findings   Reproductive/Obstetrics                             Anesthesia Physical Anesthesia Plan  ASA: IV  Anesthesia Plan: General   Post-op Pain Management:    Induction: Intravenous  PONV Risk Score and Plan: TIVA and Propofol infusion  Airway Management Planned: Nasal Cannula  Additional Equipment:   Intra-op Plan:   Post-operative Plan:   Informed Consent: I have reviewed the patients History and Physical, chart, labs and discussed the procedure including the risks, benefits and alternatives for the proposed anesthesia with the patient or authorized representative who has indicated his/her understanding and acceptance.     Plan Discussed with:   Anesthesia Plan Comments:         Anesthesia Quick Evaluation

## 2018-02-13 ENCOUNTER — Encounter: Payer: Self-pay | Admitting: Gastroenterology

## 2018-02-13 ENCOUNTER — Telehealth: Payer: Self-pay | Admitting: Emergency Medicine

## 2018-02-13 DIAGNOSIS — R5383 Other fatigue: Secondary | ICD-10-CM

## 2018-02-13 DIAGNOSIS — Z515 Encounter for palliative care: Secondary | ICD-10-CM

## 2018-02-13 DIAGNOSIS — Z7189 Other specified counseling: Secondary | ICD-10-CM

## 2018-02-13 DIAGNOSIS — N186 End stage renal disease: Secondary | ICD-10-CM

## 2018-02-13 MED ORDER — TAMSULOSIN HCL 0.4 MG PO CAPS: 0.4000 mg | ORAL_CAPSULE | Freq: Every day | ORAL | 0 refills | Status: AC

## 2018-02-13 MED ORDER — NEPRO/CARBSTEADY PO LIQD: 237.0000 mL | Freq: Two times a day (BID) | ORAL | 0 refills | Status: AC

## 2018-02-13 MED ORDER — MORPHINE SULFATE (CONCENTRATE) 10 MG/0.5ML PO SOLN: 5.0000 mg | ORAL | Status: DC | PRN

## 2018-02-13 MED ORDER — ONDANSETRON HCL 4 MG PO TABS: 4.0000 mg | ORAL_TABLET | Freq: Four times a day (QID) | ORAL | 0 refills | Status: AC | PRN

## 2018-02-13 NOTE — Progress Notes (Signed)
Patient dc home with hospice via EMS. No complaints at time of DC

## 2018-02-13 NOTE — Progress Notes (Signed)
New referral for [facility] services at home received from [place] following a Palliative [place] consult. Patient is an 84 year old man with a known history of ESRD, on dialysis Tuesday, Thursday , Saturdays. Last dialysis on 4/2. Patient was admitted to [facility] from home on 4/1 for evaluation of black tarry stools, decreased appetite and weakness. He had not been to dialysis for a week prior due to weakness. GI was consulted and EGD was performed with findings of gastritis and duodenitis, he received IV Protonix. Per chart note review patient has not been eating for several days and is not taking oral medications. Patient is no longer receiving dialysis. Patient and family have chosen to return home with hospice services. Writer met in the room with patient's wife, 2 daughters and his sister to initiated education regarding hospice services, philosophy and team approach to care with good understanding voiced. Plan is for discharge home today via EMS. No DME needs at time of discharge. Patient information faxed to referral. Hospice information and contact numbers given to Mrs. Needs. Hospital team updated. Flo Shanks RN, BSN, [place] and [facility] [place], [facility] [phone]

## 2018-02-13 NOTE — Progress Notes (Signed)
Pt refusing to take his medications and refusing to have dialysis/ Md aware

## 2018-02-13 NOTE — Telephone Encounter (Signed)
FYI: Verbal orders given to hospice for to come into home.

## 2018-02-13 NOTE — Progress Notes (Signed)
[facility]  ROUNDING NOTE   Subjective:   Family meeting yesterday and today  Patient is not interested in continuing dialysis.    Objective:  Vital signs in last 24 hours:  Temp:  [98.1 F (36.7 C)-98.3 F (36.8 C)] 98.2 F (36.8 C) (04/05 0737) Pulse Rate:  [82-102] 102 (04/05 0737) Resp:  [18] 18 (04/05 0737) BP: (103-112)/(56-77) 104/77 (04/05 0737) SpO2:  [92 %-100 %] 93 % (04/05 0737) Weight:  [84.8 kg (187 lb)] 84.8 kg (187 lb) (04/05 0332)  Weight change: 0.953 kg (2 lb 1.6 oz) Filed Weights   02/11/18 0312 02/12/18 0507 02/13/18 0332  Weight: 80.6 kg (177 lb 9.6 oz) 83.9 kg (184 lb 14.4 oz) 84.8 kg (187 lb)    Intake/Output: I/O last 3 completed shifts: In: 1772.5 [I.V.:1772.5] Out: 50 [Urine:50]   Intake/Output this shift:  No intake/output data recorded.  Physical Exam: General: Lethargy  Head: Normocephalic, atraumatic. Moist oral mucosal membranes  Eyes: Anicteric, PERRL  Neck: Supple, trachea midline  Lungs:  Clear to auscultation  Heart: Regular rate and rhythm  Abdomen:  Soft, nontender,   Extremities: no peripheral edema.  Neurologic: somnulent  Skin: No lesions  Access: Left AVF    Basic Metabolic Panel: Recent Labs  Lab 02/09/18 2053 02/10/18 0516 02/12/18 1031 02/12/18 1155  NA 138 140 138 137  K 4.7 4.5 4.9 4.8  CL 97* 100* 99* 97*  CO2 25 27 26 26   GLUCOSE 112* 96 94 92  BUN 88* 87* 54* 55*  CREATININE 8.56* 8.80* 7.04* 6.78*  CALCIUM 9.3 9.1 8.9 9.1  PHOS  --   --  5.7*  --     Liver Function Tests: Recent Labs  Lab 02/09/18 2053 02/12/18 1031  AST 19  --   ALT 10*  --   ALKPHOS 86  --   BILITOT 1.4*  --   PROT 7.3  --   ALBUMIN 2.9* 2.9*   No results for input(s): LIPASE, AMYLASE in the last 168 hours. No results for input(s): AMMONIA in the last 168 hours.  CBC: Recent Labs  Lab 02/09/18 2053 02/10/18 0516 02/10/18 1447 02/10/18 1814 02/11/18 0229 02/12/18 1031  WBC 5.8 4.4  --   --    --  4.6  HGB 8.2* 7.6* 7.4* 7.8* 7.9* 8.0*  HCT 25.2* 23.4* 23.5* 24.4* 24.8* 26.1*  MCV 97.6 97.7  --   --   --  101.6*  PLT 146* 131*  --   --   --  129*    Cardiac Enzymes: Recent Labs  Lab 02/10/18 0100  TROPONINI 0.05*    BNP: Invalid input(s): POCBNP  CBG: No results for input(s): GLUCAP in the last 168 hours.  Microbiology: Results for orders placed or performed in visit on 01/26/18  Urine Culture     Status: Abnormal   Collection Time: 01/27/18 10:30 AM  Result Value Ref Range Status   Urine Culture, Routine Final report (A)  Final   Organism ID, Bacteria Comment (A)  Final    Comment: Staphylococcus epidermidis Greater than 100,000 colony forming units per mL Based on susceptibility to oxacillin this isolate would be susceptible to: *Penicillinase-stable penicillins, such as:   Cloxacillin, Dicloxacillin, Nafcillin *Beta-lactam combination agents, such as:   Amoxicillin-clavulanic acid, Ampicillin-sulbactam,   Piperacillin-tazobactam *Oral cephems, such as:   Cefaclor, Cefdinir, Cefpodoxime, Cefprozil, Cefuroxime,   Cephalexin, Loracarbef *Parenteral cephems, such as:   Cefazolin, Cefepime, Cefotaxime, Cefotetan, Ceftaroline,   Ceftizoxime, Ceftriaxone, Cefuroxime *Carbapenems, such  as:   Doripenem, Ertapenem, Imipenem, Meropenem    Antimicrobial Susceptibility Comment  Final    Comment:       ** S = Susceptible; I = Intermediate; R = Resistant **                    P = Positive; N = Negative             MICS are expressed in micrograms per mL    Antibiotic                 RSLT#1    RSLT#2    RSLT#3    RSLT#4 Ciprofloxacin                  R Gentamicin                     S Levofloxacin                   I Linezolid                      S Nitrofurantoin                 S Oxacillin                      S Penicillin                     R Quinupristin/Dalfopristin      S Rifampin                       S Tetracycline                    S Trimethoprim/Sulfa             R Vancomycin                     S     Coagulation Studies: No results for input(s): LABPROT, INR in the last 72 hours.  Urinalysis: No results for input(s): COLORURINE, LABSPEC, PHURINE, GLUCOSEU, HGBUR, BILIRUBINUR, KETONESUR, PROTEINUR, UROBILINOGEN, NITRITE, LEUKOCYTESUR in the last 72 hours.  Invalid input(s): APPERANCEUR    Imaging: No results found.   Medications:   . sodium chloride     . donepezil  10 mg Oral QHS  . midodrine  10 mg Oral TID WC  . pantoprazole  40 mg Oral Daily  . sevelamer carbonate  1,600 mg Oral TID  . sodium chloride flush  3 mL Intravenous Q12H  . sucralfate  1 g Oral TID AC & HS  . tamsulosin  0.4 mg Oral Daily   sodium chloride, acetaminophen **OR** acetaminophen, morphine CONCENTRATE, ondansetron **OR** ondansetron (ZOFRAN) IV, sodium chloride flush  Assessment/ Plan:  [name] is a 84 y.o. white male end stage renal disease on hemodialysis, multiple myeloma, coronary artery disease, atrial fibrillation  [facility]/ [facility]/ TTS  1.  End-stage renal disease: Patient does not want to continue dialysis.  - Appreciate palliative care and Hospice input - Hold dialysis for now.   2.  Anemia of chronic kidney disease: with GI bleed.  - Pantoprazole ordered - Appreciate GI input. EGD today with no findings.   3.  Secondary hyperparathyroidism - calcitriol - sevelamer  4.  Hypertension: blood pressure at goal.  - tamsulosin.   Will sign off.  LOS: 3 Frank Huang 4/5/20195:38 PM

## 2018-02-13 NOTE — Anesthesia Postprocedure Evaluation (Signed)
Anesthesia Post Note  Patient: Frank Huang  Procedure(s) Performed: ESOPHAGOGASTRODUODENOSCOPY (EGD) WITH PROPOFOL (N/A )  Patient location during evaluation: PACU Anesthesia Type: General Level of consciousness: awake and alert Pain management: pain level controlled Vital Signs Assessment: post-procedure vital signs reviewed and stable Respiratory status: spontaneous breathing, nonlabored ventilation and respiratory function stable Cardiovascular status: blood pressure returned to baseline and stable Postop Assessment: no apparent nausea or vomiting Anesthetic complications: no     Last Vitals:  Vitals:   02/13/18 0332 02/13/18 0737  BP: (!) 103/56 104/77  Pulse: 99 (!) 102  Resp: 18 18  Temp: 36.8 C 36.8 C  SpO2: 92% 93%    Last Pain:  Vitals:   02/13/18 0737  TempSrc: Oral  PainSc: 0-No pain                 Alphonsus Sias

## 2018-02-13 NOTE — Progress Notes (Signed)
Discharge instructions explained to pts family/ verbalized an understanding/ iv and tele removed/ EMS called to transport pt  home

## 2018-02-13 NOTE — Consult Note (Signed)
Consultation Note Date: 02/13/2018   Patient Name: Frank Huang  DOB: July 30, 1932  MRN: [id number]  Age / Sex: 84 y.o., male  PCP: [name]., MD Referring Physician: Epifanio Lesches, MD  Reason for Consultation: Establishing goals of care  HPI/Patient Profile: Frank Huang  is a 84 y.o. male  brought in to the emergency room for large black tarry stool. Patient also missed hemodialysis for the last 1 week, last hemodialysis was on Tuesday, patient states that he just did not feel like going, also with complaints of swelling in his legs, increased sleepiness, decreased p.o. intake, decreased activity.     Clinical Assessment and Goals of Care: Patient resting in bed. His wife is at bedside. She states he has been eating and drinking sips and chips for the past 2 weeks, and only eating an egg and a piece of toast since admission. He has been telling her he does not feel like going to dialysis. She states his children were not happy with her decision to perform the EGD, and wanted him to be comfortable earlier, though in her heart she wanted to be sure there was not something easily correctable. Discussed diagnosis, prognosis, GOC, EOL wishes disposition and options.  The difference between an aggressive medical intervention path  and a hospice comfort care path for this patient at this time was discussed.  Values and goals of care important to patient and family were attempted to be elicited.  He states he is tired. He no longer wants dialysis, and wants to be comfortable for what time he has left. He understands that without dialysis he will not live, but states he is ready to go. He and his wife want him to proceed home with hospice for the time he has left.   MOST form completed for DNR and comfort care measures.   [place] with hospice.    Code Status/Advance Care  Planning:  DNR    Symptom Management:   Morphine PRN for pain and SOB.   Palliative Prophylaxis:   Eye Care and Oral Care    Prognosis:   < 2 weeks Not continuing dialysis. Poor oral intake.   Discharge Planning: Home with Hospice      Primary Diagnoses: Present on Admission: . GIB (gastrointestinal bleeding)   I have reviewed the medical record, interviewed the patient and family, and examined the patient. The following aspects are pertinent.  Past Medical History:  Diagnosis Date  . Cancer ([place])   . Chronic kidney disease   . COPD (chronic obstructive pulmonary disease) ([place])   . Coronary artery disease   . GERD (gastroesophageal reflux disease)   . Hypertension   . Multiple myeloma ([place])   . Myocardial infarction ([place]) 2005  . Neuropathy   . Shortness of breath dyspnea   . Stroke (cerebrum) ([facility])    weakness Lt hand  . Systolic CHF [facility])    Social History   Socioeconomic History  . Marital status: Married  Spouse name: Not on file  . Number of children: Not on file  . Years of education: Not on file  . Highest education level: Not on file  Occupational History  . Not on file  Social Needs  . Financial resource strain: Not on file  . Food insecurity:    Worry: Not on file    Inability: Not on file  . Transportation needs:    Medical: Not on file    Non-medical: Not on file  Tobacco Use  . Smoking status: Current Every Day Smoker    Packs/day: 0.50    Types: Cigarettes  . Smokeless tobacco: Never Used  Substance and Sexual Activity  . Alcohol use: No    Alcohol/week: 0.0 oz  . Drug use: No  . Sexual activity: Never  Lifestyle  . Physical activity:    Days per week: Not on file    Minutes per session: Not on file  . Stress: Not on file  Relationships  . Social connections:    Talks on phone: Not on file    Gets together: Not on file    Attends religious service: Not on file    Active member of club or organization: Not on file     Attends meetings of clubs or organizations: Not on file    Relationship status: Not on file  Other Topics Concern  . Not on file  Social History Narrative  . Not on file   Family History  Problem Relation Age of Onset  . Alzheimer's disease Mother   . Kidney failure Father   . Bone cancer Sister   . Stomach cancer Sister    Scheduled Meds: . calcitRIOL  0.5 mcg Oral Once per day on Mon Wed Fri  . donepezil  10 mg Oral QHS  . feeding supplement (NEPRO CARB STEADY)  237 mL Oral BID BM  . lidocaine-prilocaine  1 application Topical Once  . midodrine  10 mg Oral TID WC  . multivitamin  1 tablet Oral QHS  . multivitamin with minerals  1 tablet Oral Once per day on Mon Wed Fri  . pantoprazole  40 mg Oral Daily  . pravastatin  80 mg Oral q1800  . sevelamer carbonate  1,600 mg Oral TID  . sodium chloride flush  3 mL Intravenous Q12H  . sucralfate  1 g Oral TID AC & HS  . tamsulosin  0.4 mg Oral Daily  . vitamin C  250 mg Oral BID   Continuous Infusions: . sodium chloride     PRN Meds:.sodium chloride, acetaminophen **OR** acetaminophen, HYDROcodone-acetaminophen, ondansetron **OR** ondansetron (ZOFRAN) IV, sodium chloride flush Medications Prior to Admission:  Prior to Admission medications   Medication Sig Start Date End Date Taking? Authorizing Provider  aspirin EC 81 MG tablet Take 81 mg by mouth daily.   Yes [provider]  calcitRIOL (ROCALTROL) 0.5 MCG capsule Take 0.5 mcg by mouth 3 (three) times a week.   Yes [provider]  clopidogrel (PLAVIX) 75 MG tablet Take 1 tablet (75 mg total) by mouth daily. 12/17/17  Yes Epifanio Lesches, MD  donepezil (ARICEPT) 10 MG tablet TAKE 1 TABLET BY MOUTH AT  BEDTIME 02/17/17  Yes [name]., MD  furosemide (LASIX) 80 MG tablet Take 1 tablet (80 mg total) by mouth every Tuesday, Thursday, Saturday, and Sunday. 02/01/17  Yes Bettey Costa, MD  isosorbide mononitrate (IMDUR) 30 MG 24 hr tablet Take 1 tablet (30  mg total) by mouth daily.  12/17/17  Yes Epifanio Lesches, MD  lidocaine-prilocaine (EMLA) cream Apply 1 application topically daily as needed.  04/18/16  Yes [provider]  lovastatin (MEVACOR) 40 MG tablet TAKE 1 TABLET BY MOUTH EVERY DAY FOR HIGH CHOLESTEROL 08/10/14  Yes [provider]  metoprolol tartrate (LOPRESSOR) 25 MG tablet Take 1 tablet (25 mg total) by mouth 2 (two) times daily. 12/17/17  Yes Epifanio Lesches, MD  Multiple Vitamin (MULTI-VITAMINS) TABS Take 1 tablet by mouth daily. Reported on 04/01/2016   Yes [provider]  omeprazole (PRILOSEC) 20 MG capsule TAKE 1 CAPSULE BY MOUTH  DAILY. 11/05/16  Yes [name]., MD  RENVELA 800 MG tablet Take 1,600 mg by mouth 3 (three) times daily.  11/05/16  Yes [provider]  sucralfate (CARAFATE) 1 g tablet Take 1 tablet (1 g total) by mouth 4 (four) times daily -  before meals and at bedtime. 07/17/17  Yes [name]., MD  traMADol (ULTRAM) 50 MG tablet Take 1 tablet (50 mg total) every 12 (twelve) hours as needed by mouth. 09/26/17  Yes Bacigalupo, Dionne Bucy, MD   No Known Allergies Review of Systems  Neurological: Positive for weakness.    Physical Exam  Constitutional: No distress.  HENT:  Head: Normocephalic.  Eyes: Pupils are equal, round, and reactive to light.  Pulmonary/Chest: Effort normal.  Abdominal: Soft.  Neurological: He is alert.  Oriented.  Skin: Skin is warm and dry.    Vital Signs: BP 104/77 (BP Location: Right Arm)   Pulse (!) 102   Temp 98.2 F (36.8 C) (Oral)   Resp 18   Ht 6' 3"  (1.905 m)   Wt 84.8 kg (187 lb)   SpO2 93%   BMI 23.37 kg/m  Pain Scale: 0-10   Pain Score: Asleep   SpO2: SpO2: 93 % O2 Device:SpO2: 93 % O2 Flow Rate: .O2 Flow Rate (L/min): 3 L/min  IO: Intake/output summary:   Intake/Output Summary (Last 24 hours) at 02/13/2018 0924 Last data filed at 02/13/2018 0500 Gross per 24 hour  Intake -  Output 0 ml  Net 0 ml      LBM: Last BM Date: 02/10/18 Baseline Weight: Weight: 88 kg (194 lb) Most recent weight: Weight: 84.8 kg (187 lb)     Palliative Assessment/Data:  20%     Time In: 9:00 Time Out: 10:10 Time Total: 70 min Greater than 50%  of this time was spent counseling and coordinating care related to the above assessment and plan.  Signed by: Asencion Gowda, NP   Please contact Palliative Medicine Team phone at [phone] for questions and concerns.  For individual provider: See Shea Evans

## 2018-02-13 NOTE — Discharge Summary (Signed)
Frank Huang, is a 84 y.o. male  DOB May 17, 1932  MRN [id number].  Admission date:  02/09/2018  Admitting Physician  Gorden Harms, MD  Discharge Date:  02/13/2018   Primary MD  [name]., MD  Recommendations for primary care physician for things to follow:    being discharged home with hospice today   Admission Diagnosis  Weakness [R53.1] Gastrointestinal hemorrhage with melena [K92.1] Fatigue, unspecified type [R53.83]   Discharge Diagnosis  Weakness [R53.1] Gastrointestinal hemorrhage with melena [K92.1] Fatigue, unspecified type [R53.83]   Active Problems:   GIB (gastrointestinal bleeding)   Melena   Gastritis without bleeding   Duodenitis      Past Medical History:  Diagnosis Date  . Cancer ([place])   . Chronic kidney disease   . COPD (chronic obstructive pulmonary disease) ([place])   . Coronary artery disease   . GERD (gastroesophageal reflux disease)   . Hypertension   . Multiple myeloma ([place])   . Myocardial infarction ([place]) 2005  . Neuropathy   . Shortness of breath dyspnea   . Stroke (cerebrum) (H[facility])    weakness Lt hand  . Systolic CHF [facility])     Past Surgical History:  Procedure Laterality Date  . APPENDECTOMY    . AV FISTULA PLACEMENT Left   . CARDIAC CATHETERIZATION N/A 11/15/2016   Procedure: Left Heart Cath and Coronary Angiography;  Surgeon: Isaias Cowman, MD;  Location: [place] CV LAB;  Service: Cardiovascular;  Laterality: N/A;  . CHOLECYSTECTOMY    . ESOPHAGOGASTRODUODENOSCOPY (EGD) WITH PROPOFOL N/A 02/12/2018   Procedure: ESOPHAGOGASTRODUODENOSCOPY (EGD) WITH PROPOFOL;  Surgeon: Lucilla Lame, MD;  Location: [facility] ENDOSCOPY;  Service: Endoscopy;  Laterality: N/A;  . LEFT HEART CATH AND CORONARY ANGIOGRAPHY N/A 12/17/2017   Procedure: LEFT HEART CATH AND CORONARY  ANGIOGRAPHY and possible PCI and stent;  Surgeon: Yolonda Kida, MD;  Location: [place] CV LAB;  Service: Cardiovascular;  Laterality: N/A;  . SHOULDER ARTHROSCOPY WITH OPEN ROTATOR CUFF REPAIR Left 09/19/2015   Procedure: SHOULDER ARTHROSCOPY , subacromial decompression, debridement;  Surgeon: Corky Mull, MD;  Location: [facility] ORS;  Service: Orthopedics;  Laterality: Left;  . TOTAL HIP ARTHROPLASTY Left        History of present illness and  Hospital Course:     Kindly see H&P for history of present illness and admission details, please review complete Labs, Consult reports and Test reports for all details in brief  HPI  from the history and physical done on the day of admission 84 year old male patient with history of COPD, GERD, hypertension, multiple myeloma, chronic hypotension, ESRD on hemodialysis admitted because of large black stool, poor p.o. intake.   Hospital Course   #1 upper GI bleed with stable hemoglobin, stop her aspirin on admission because of black stool, patient is seen by gastroenterology, patient received PPIs, patient had EGD yesterday and EGD showed severe duodenitis and gastric patient continued to have poor p.o. intake associated with dry heaving.  Received PPIs.  Family decided to take him home with hospice because of his poor p.o. intake, dry heaving, and also he refused hemodialysis.  Patient  family met with palliative care. ESRD on hemodialysis, patient received hemodialysis times in this hospitalization, patient could not go for dialysis sessions as an outpatient wife feels with nausea, vomiting, poor p.o. intake.  Patient refused further hemodialysis sessions and family wants to take him home today with hospice.  Appreciate palliative care following and also nephrology following.  Discharge Condition: Stable   Follow UP Home with hospice.  Patient can get morphine and Ativan as needed per hospice people.     Discharge Instructions  and   Discharge Medications      Allergies as of 02/13/2018   No Known Allergies     Medication List    STOP taking these medications   aspirin EC 81 MG tablet   calcitRIOL 0.5 MCG capsule Commonly known as:  ROCALTROL   clopidogrel 75 MG tablet Commonly known as:  PLAVIX   donepezil 10 MG tablet Commonly known as:  ARICEPT   furosemide 80 MG tablet Commonly known as:  LASIX   lidocaine-prilocaine cream Commonly known as:  EMLA   lovastatin 40 MG tablet Commonly known as:  MEVACOR   metoprolol tartrate 25 MG tablet Commonly known as:  LOPRESSOR   MULTI-VITAMINS Tabs   omeprazole 20 MG capsule Commonly known as:  PRILOSEC   RENVELA 800 MG tablet Generic drug:  sevelamer carbonate   sucralfate 1 g tablet Commonly known as:  CARAFATE   traMADol 50 MG tablet Commonly known as:  ULTRAM     TAKE these medications   feeding supplement (NEPRO CARB STEADY) Liqd Take 237 mLs by mouth 2 (two) times daily between meals.   isosorbide mononitrate 30 MG 24 hr tablet Commonly known as:  IMDUR Take 1 tablet (30 mg total) by mouth daily.   ondansetron 4 MG tablet Commonly known as:  ZOFRAN Take 1 tablet (4 mg total) by mouth every 6 (six) hours as needed for nausea.   tamsulosin 0.4 MG Caps capsule Commonly known as:  FLOMAX Take 1 capsule (0.4 mg total) by mouth daily.         Diet and Activity recommendation: See Discharge Instructions above   Consults obtained -nephrology, gastroenterology, palliative care   Major procedures and Radiology Reports - PLEASE review detailed and final reports for all details, in brief -      US Renal  Result Date: 02/11/2018 CLINICAL DATA:  End-stage renal disease dysuria EXAM: RENAL / URINARY TRACT ULTRASOUND COMPLETE COMPARISON:  CT abdomen 04/30/2016 FINDINGS: Right Kidney: Length: 10.5 cm. Cortex diffusely echogenic. Multiple renal cysts. Largest cyst 3 x 4 cm. Left Kidney: Length: 9.1 cm. Echogenic cortex diffusely with  numerous renal cysts. Largest cyst 5.6 x 5.2 cm left lower pole. Bladder: Not visualized IMPRESSION: Increased cortical echogenicity with cortical atrophy compatible with chronic renal failure. Numerous bilateral renal cysts. Electronically Signed   By: Franchot Gallo M.D.   On: 02/11/2018 13:36    Micro Results    No results found for this or any previous visit (from the past 240 hour(s)).     Today   Subjective:   Frank Huang is stable for discharge home with hospice. Objective:   Blood pressure 104/77, pulse (!) 102, temperature 98.2 F (36.8 C), temperature source Oral, resp. rate 18, height _0  (1.905 m), weight 84.8 kg (187 lb), SpO2 93 %.   Intake/Output Summary (Last 24 hours) at 02/13/2018 1021 Last data filed at 02/13/2018 0500 Gross per 24 hour  Intake -  Output 0 ml  Net 0 ml    Exam Appears cachectic. Supple Neck,No JVD, No cervical lymphadenopathy appriciated.  Symmetrical Chest wall movement, Good air movement bilaterally, CTAB RRR,No Gallops,Rubs or new Murmurs, No Parasternal Heave +ve B.Sounds, Abd Soft, Non tender, No organomegaly appriciated, No rebound -guarding or rigidity. No Cyanosis, Clubbing or edema, No new Rash or bruise  Data Review  CBC w Diff:  Lab Results  Component Value Date   WBC 4.6 02/12/2018   HGB 8.0 (L) 02/12/2018   HGB 10.1 (L) 04/11/2016   HCT 26.1 (L) 02/12/2018   HCT 31.5 (L) 04/11/2016   PLT 129 (L) 02/12/2018   PLT 150 04/11/2016   LYMPHOPCT 20 01/21/2018   LYMPHOPCT 40.1 02/28/2015   MONOPCT 5 01/21/2018   MONOPCT 5.8 02/28/2015   EOSPCT 8 01/21/2018   EOSPCT 5.5 02/28/2015   BASOPCT 1 01/21/2018   BASOPCT 0.9 02/28/2015    CMP:  Lab Results  Component Value Date   NA 137 02/12/2018   NA 139 06/11/2017   NA 140 09/29/2014   K 4.8 02/12/2018   K 3.8 09/29/2014   CL 97 (L) 02/12/2018   CL 98 09/29/2014   CO2 26 02/12/2018   CO2 35 (H) 09/29/2014   BUN 55 (H) 02/12/2018   BUN 39 (A) 06/11/2017   BUN 23  (H) 09/29/2014   CREATININE 6.78 (H) 02/12/2018   CREATININE 4.49 (H) 09/29/2014   PROT 7.3 02/09/2018   PROT 7.4 09/29/2014   ALBUMIN 2.9 (L) 02/12/2018   ALBUMIN 3.9 04/11/2016   ALBUMIN 3.3 (L) 09/29/2014   BILITOT 1.4 (H) 02/09/2018   BILITOT 0.6 09/29/2014   ALKPHOS 86 02/09/2018   ALKPHOS 91 09/29/2014   AST 19 02/09/2018   AST 16 09/29/2014   ALT 10 (L) 02/09/2018   ALT 14 09/29/2014  .   Total Time in preparing paper work, data evaluation and todays exam - 35 minutes  Epifanio Lesches M.D on 02/13/2018 at 10:21 AM    Note: This dictation was prepared with Dragon dictation along with smaller phrase technology. Any transcriptional errors that result from this process are unintentional.

## 2018-02-13 NOTE — Care Management (Addendum)
RNCM spoke with patient's wife. She plans to take patient home today followed by [facility].  She is currently declining a hospital bed but requests EMS. She is requesting hospice of [facility].  I have reached out to [place] with hospice to help with arrangements. Patient is listed as Full code and on hemodialysis.  Palliative note pending. Santiago Glad will follow.

## 2018-03-01 DIAGNOSIS — N185 Chronic kidney disease, stage 5: Secondary | ICD-10-CM | POA: Diagnosis not present

## 2018-03-04 ENCOUNTER — Telehealth: Payer: Self-pay | Admitting: Family Medicine

## 2018-03-04 NOTE — Telephone Encounter (Signed)
Received FMLA form from [place] for pt's sister Sherri Sear. Form was handed to [place] to get filled out by provider. Thanks CC

## 2018-03-05 NOTE — Telephone Encounter (Signed)
Dr Rosanna Randy, The patient is no longer going to dialysis at all.  Hospice comes in some to check on him medically.  His sister Enid Derry is there everyday, all day to help Frank Huang because she can't get him up by herself.  They take him to the bathroom, bath him etc.  If you can do the FMLA papers because she really is caring for him as much as Frank Huang is according to [place].

## 2018-03-05 NOTE — Telephone Encounter (Signed)
ok 

## 2018-03-11 ENCOUNTER — Ambulatory Visit: Payer: Self-pay | Admitting: Family Medicine

## 2018-03-12 ENCOUNTER — Ambulatory Visit: Payer: Self-pay | Admitting: Family Medicine

## 2018-03-13 ENCOUNTER — Other Ambulatory Visit: Payer: Self-pay | Admitting: *Deleted

## 2018-03-13 DIAGNOSIS — C9001 Multiple myeloma in remission: Secondary | ICD-10-CM

## 2018-03-16 NOTE — Telephone Encounter (Signed)
Have you completed these forms? 

## 2018-03-17 NOTE — Telephone Encounter (Signed)
I have not seen them I think--I will check.

## 2018-03-18 ENCOUNTER — Ambulatory Visit: Payer: Medicare Other | Admitting: Hematology and Oncology

## 2018-03-18 ENCOUNTER — Other Ambulatory Visit: Payer: Medicare Other

## 2018-03-27 ENCOUNTER — Other Ambulatory Visit: Payer: Self-pay | Admitting: Family Medicine

## 2018-03-31 DIAGNOSIS — N185 Chronic kidney disease, stage 5: Secondary | ICD-10-CM | POA: Diagnosis not present

## 2018-04-11 DEATH — deceased

## 2018-05-20 ENCOUNTER — Ambulatory Visit: Payer: Self-pay | Admitting: Family Medicine

## 2019-06-27 IMAGING — CR DG BONE SURVEY MET
9 of 10 series · 9 of 10 positions shown · non-contrast
Comparison: None.

CLINICAL DATA: Multiple myeloma, remission status unspecified,
compare to prior. Pt doesn't have any symptoms or complaints of pain
anywhere. Pt had a lot of difficulty standing erect and laying flat
this year due to upper and lower back pain

EXAM:
METASTATIC BONE SURVEY

[orbits lat]
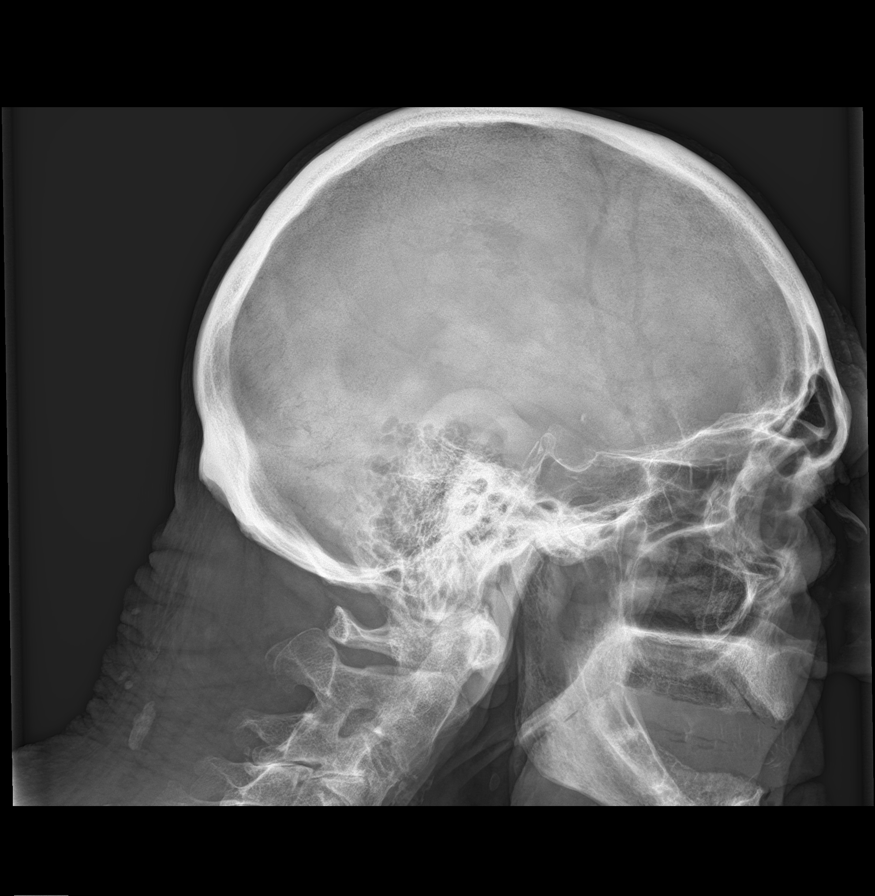

[shoulder ap (1 of 2)]
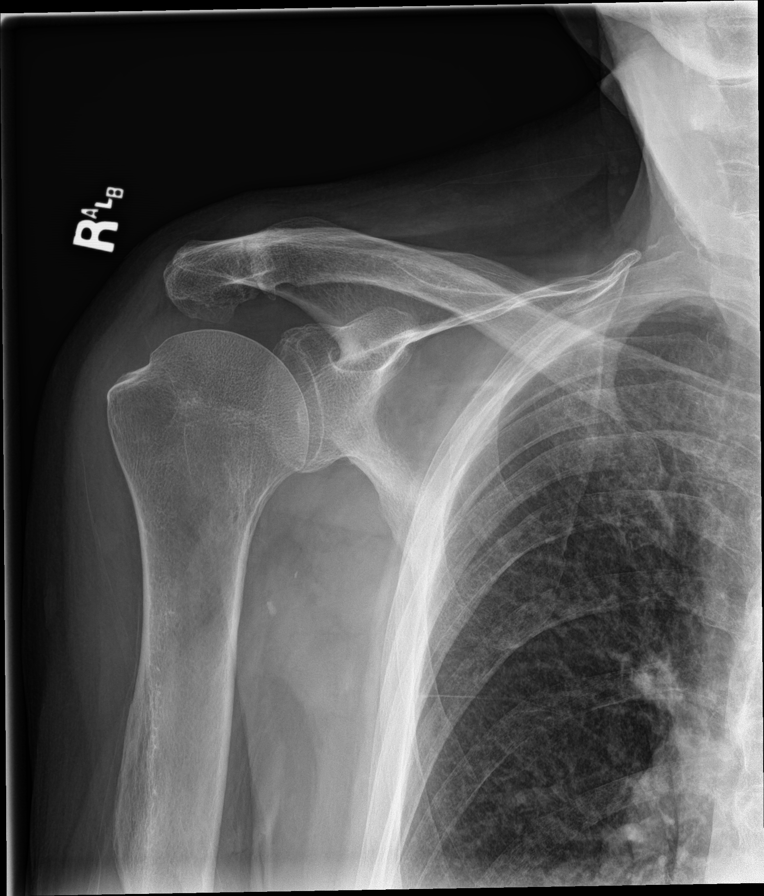

[shoulder ap (2 of 2)]
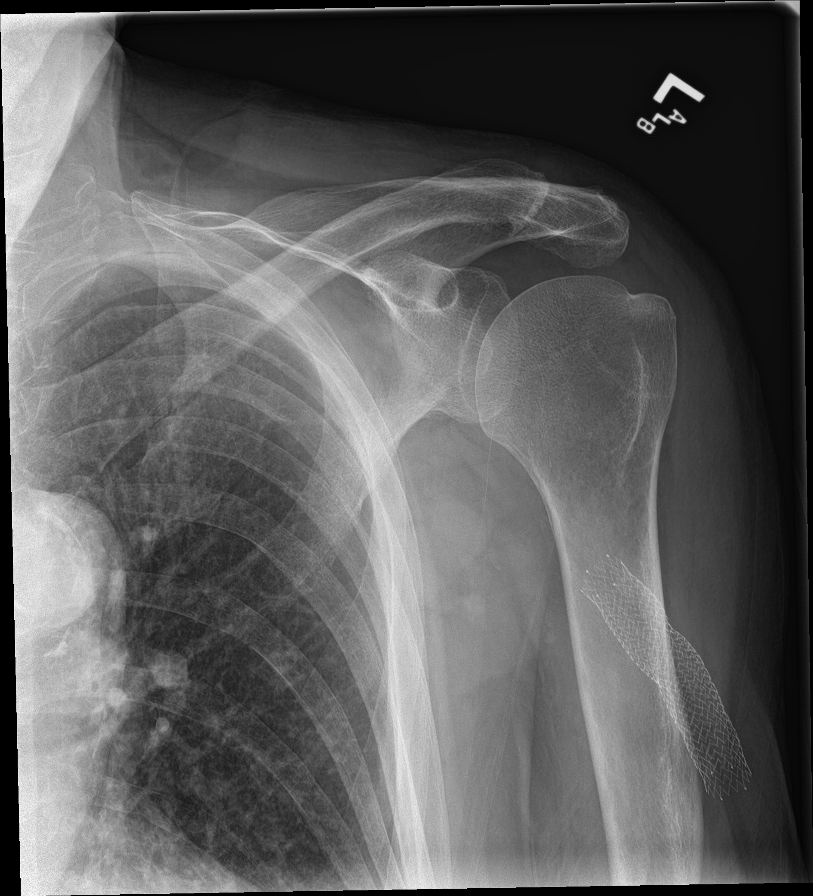

[humerus ap (1 of 2)]
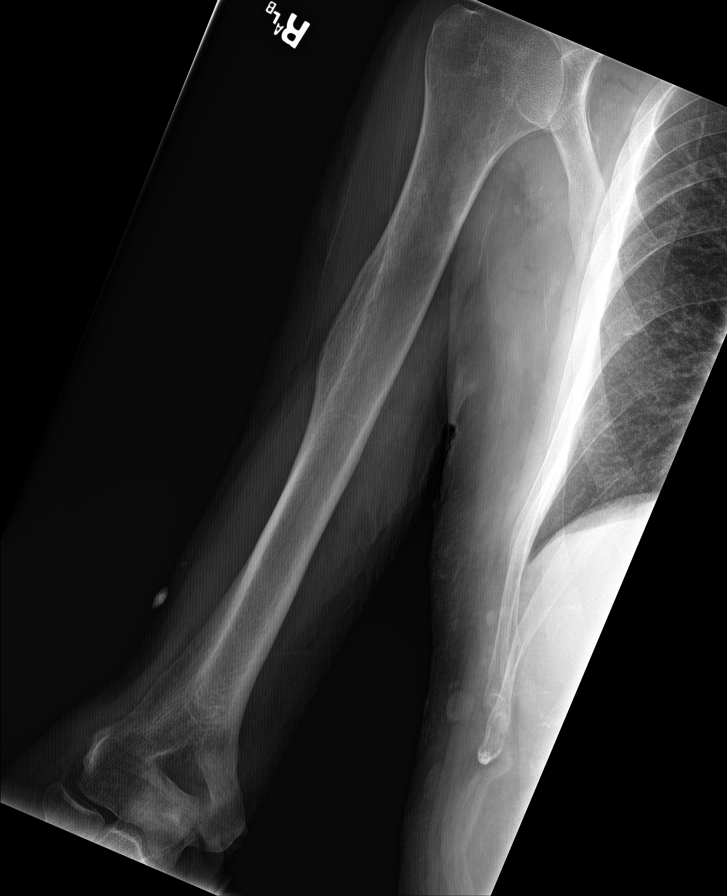

[humerus ap (2 of 2)]
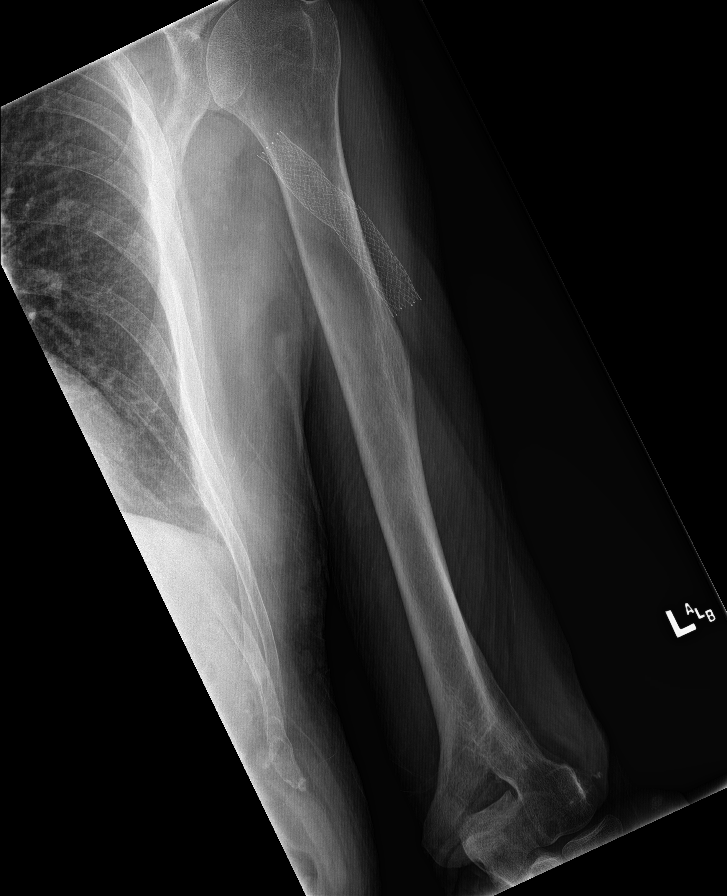

[forearm ap (1 of 2)]
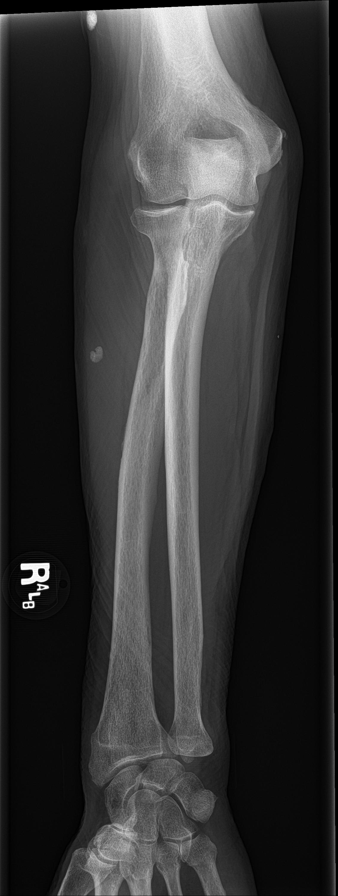

[forearm ap (2 of 2)]
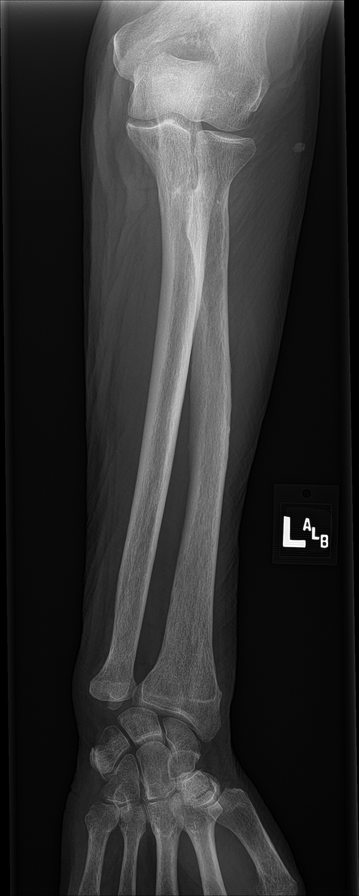

[c-spine ap]
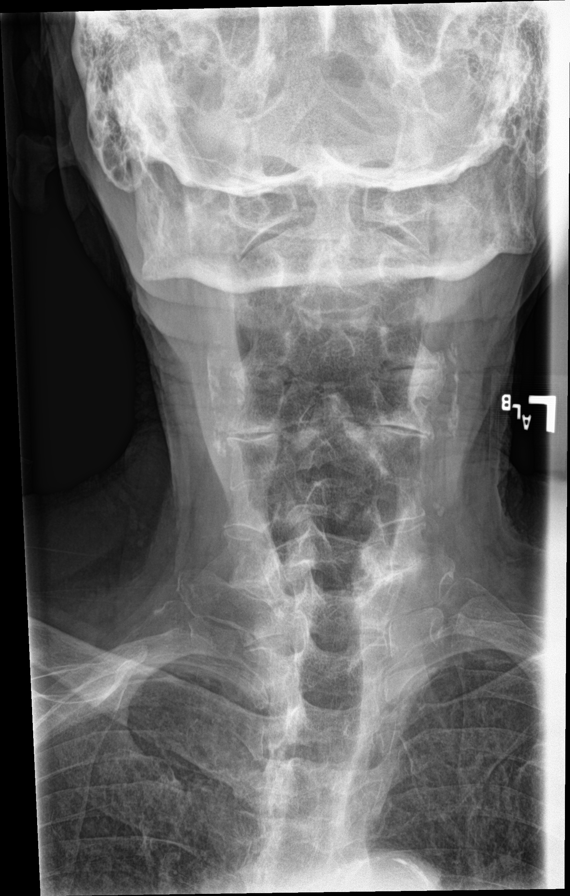

[c-spine lat]
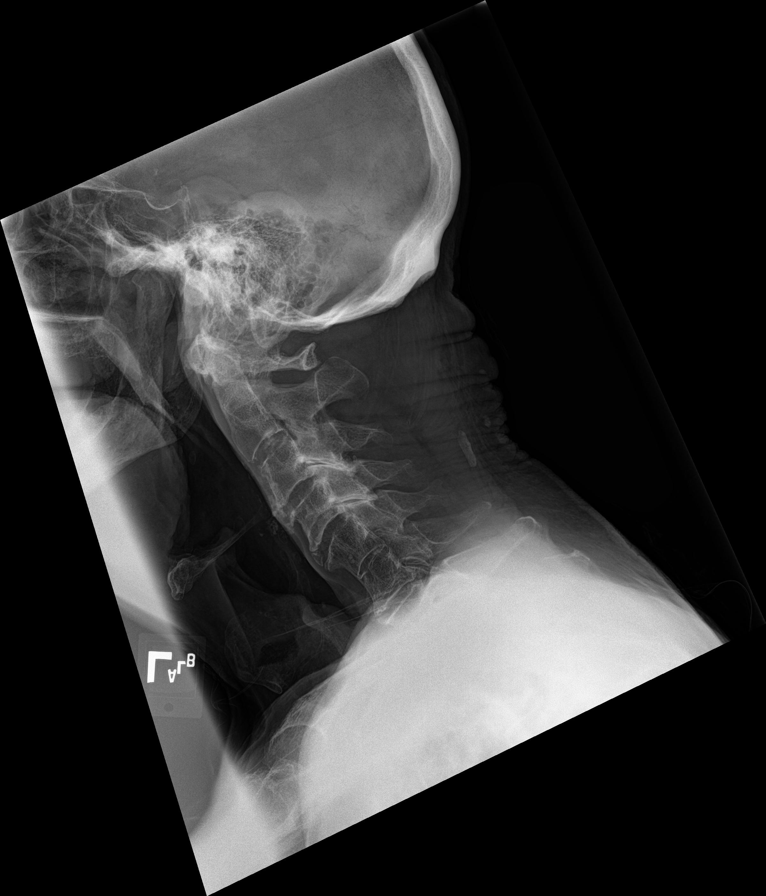

[9 of 10 positions shown; findings below may reference images not displayed]

FINDINGS: Multiple x-rays of the axial and appendicular skeleton are provided.

No aggressive lytic or sclerotic osseous lesion. Generalized
osteopenia.

No focal consolidation, pleural effusion or pneumothorax. Stable
mild cardiomegaly.

Bilateral carotid artery atherosclerosis.

Mild degenerative disc disease at C4-5, C5-6 and C6-7. Bilateral
facet arthropathy at C3-4, C4-5 and C5-6.

Mild degenerative disc disease throughout the thoracic spine.

Bilateral facet arthropathy at L3-4, L4-5 and L5-S1.

Mild arthropathy of the acromioclavicular joints.

No right hip fracture or dislocation. Left hip arthroplasty without
failure or complication.

Aortic atherosclerosis. Peripheral vascular atherosclerotic disease.
IMPRESSION: 1. No aggressive lytic or sclerotic osseous lesion of the axial and
appendicular skeleton.
